# Patient Record
Sex: Female | Born: 1950 | Race: White | Hispanic: No | Marital: Married | Smoking: Former smoker
Health system: Southern US, Community
[De-identification: ages and names within clinical notes are randomized; demographics above are authoritative.]

## PROBLEM LIST (undated history)

## (undated) DIAGNOSIS — J449 Chronic obstructive pulmonary disease, unspecified: Secondary | ICD-10-CM

## (undated) DIAGNOSIS — J309 Allergic rhinitis, unspecified: Secondary | ICD-10-CM

## (undated) DIAGNOSIS — J45909 Unspecified asthma, uncomplicated: Secondary | ICD-10-CM

## (undated) HISTORY — DX: Allergic rhinitis, unspecified: J30.9

## (undated) HISTORY — PX: ABDOMINAL HYSTERECTOMY: SHX81

## (undated) HISTORY — DX: Chronic obstructive pulmonary disease, unspecified: J44.9

## (undated) HISTORY — PX: APPENDECTOMY: SHX54

## (undated) HISTORY — DX: Unspecified asthma, uncomplicated: J45.909

---

## 2011-02-13 DIAGNOSIS — R0602 Shortness of breath: Secondary | ICD-10-CM | POA: Insufficient documentation

## 2011-07-13 DIAGNOSIS — G8929 Other chronic pain: Secondary | ICD-10-CM | POA: Insufficient documentation

## 2012-03-21 DIAGNOSIS — B351 Tinea unguium: Secondary | ICD-10-CM | POA: Insufficient documentation

## 2012-06-18 DIAGNOSIS — M79671 Pain in right foot: Secondary | ICD-10-CM | POA: Insufficient documentation

## 2012-06-18 DIAGNOSIS — G14 Postpolio syndrome: Secondary | ICD-10-CM | POA: Insufficient documentation

## 2012-06-18 DIAGNOSIS — M545 Low back pain, unspecified: Secondary | ICD-10-CM | POA: Insufficient documentation

## 2012-11-15 DIAGNOSIS — F329 Major depressive disorder, single episode, unspecified: Secondary | ICD-10-CM | POA: Insufficient documentation

## 2012-11-15 DIAGNOSIS — F4541 Pain disorder exclusively related to psychological factors: Secondary | ICD-10-CM | POA: Insufficient documentation

## 2012-11-15 DIAGNOSIS — F32A Depression, unspecified: Secondary | ICD-10-CM | POA: Insufficient documentation

## 2012-11-15 DIAGNOSIS — F4542 Pain disorder with related psychological factors: Secondary | ICD-10-CM | POA: Insufficient documentation

## 2013-02-12 DIAGNOSIS — E7800 Pure hypercholesterolemia, unspecified: Secondary | ICD-10-CM | POA: Insufficient documentation

## 2013-04-22 DIAGNOSIS — K921 Melena: Secondary | ICD-10-CM | POA: Insufficient documentation

## 2013-07-08 DIAGNOSIS — M25559 Pain in unspecified hip: Secondary | ICD-10-CM | POA: Insufficient documentation

## 2015-02-10 DIAGNOSIS — J309 Allergic rhinitis, unspecified: Secondary | ICD-10-CM

## 2015-02-10 DIAGNOSIS — J3089 Other allergic rhinitis: Secondary | ICD-10-CM | POA: Insufficient documentation

## 2015-02-10 DIAGNOSIS — J4489 Other specified chronic obstructive pulmonary disease: Secondary | ICD-10-CM | POA: Insufficient documentation

## 2015-02-10 DIAGNOSIS — J449 Chronic obstructive pulmonary disease, unspecified: Secondary | ICD-10-CM

## 2015-02-18 ENCOUNTER — Other Ambulatory Visit: Payer: Self-pay | Admitting: *Deleted

## 2015-02-18 MED ORDER — OMALIZUMAB 150 MG ~~LOC~~ SOLR
150.0000 mg | SUBCUTANEOUS | Status: DC
  Administered 2015-04-04 – 2017-09-19 (×32): 150 mg via SUBCUTANEOUS

## 2015-03-14 ENCOUNTER — Ambulatory Visit (INDEPENDENT_AMBULATORY_CARE_PROVIDER_SITE_OTHER): Payer: Medicare Other

## 2015-03-14 DIAGNOSIS — J309 Allergic rhinitis, unspecified: Secondary | ICD-10-CM

## 2015-03-23 ENCOUNTER — Ambulatory Visit (INDEPENDENT_AMBULATORY_CARE_PROVIDER_SITE_OTHER): Payer: Medicare Other

## 2015-03-23 DIAGNOSIS — J309 Allergic rhinitis, unspecified: Secondary | ICD-10-CM

## 2015-03-30 ENCOUNTER — Ambulatory Visit (INDEPENDENT_AMBULATORY_CARE_PROVIDER_SITE_OTHER): Payer: Medicare Other | Admitting: *Deleted

## 2015-03-30 DIAGNOSIS — J309 Allergic rhinitis, unspecified: Secondary | ICD-10-CM

## 2015-04-04 ENCOUNTER — Ambulatory Visit (INDEPENDENT_AMBULATORY_CARE_PROVIDER_SITE_OTHER): Payer: Medicare Other

## 2015-04-04 DIAGNOSIS — J4540 Moderate persistent asthma, uncomplicated: Secondary | ICD-10-CM | POA: Diagnosis not present

## 2015-04-05 DIAGNOSIS — J4540 Moderate persistent asthma, uncomplicated: Secondary | ICD-10-CM | POA: Diagnosis not present

## 2015-04-11 ENCOUNTER — Ambulatory Visit (INDEPENDENT_AMBULATORY_CARE_PROVIDER_SITE_OTHER): Payer: Medicare Other

## 2015-04-11 DIAGNOSIS — J309 Allergic rhinitis, unspecified: Secondary | ICD-10-CM

## 2015-04-18 DIAGNOSIS — I639 Cerebral infarction, unspecified: Secondary | ICD-10-CM | POA: Insufficient documentation

## 2015-04-18 DIAGNOSIS — A809 Acute poliomyelitis, unspecified: Secondary | ICD-10-CM | POA: Insufficient documentation

## 2015-04-18 DIAGNOSIS — I10 Essential (primary) hypertension: Secondary | ICD-10-CM | POA: Insufficient documentation

## 2015-04-20 ENCOUNTER — Ambulatory Visit (INDEPENDENT_AMBULATORY_CARE_PROVIDER_SITE_OTHER): Payer: Medicare Other

## 2015-04-20 DIAGNOSIS — J309 Allergic rhinitis, unspecified: Secondary | ICD-10-CM

## 2015-04-27 ENCOUNTER — Ambulatory Visit (INDEPENDENT_AMBULATORY_CARE_PROVIDER_SITE_OTHER): Payer: Medicare Other

## 2015-04-27 DIAGNOSIS — J309 Allergic rhinitis, unspecified: Secondary | ICD-10-CM

## 2015-05-03 ENCOUNTER — Ambulatory Visit (INDEPENDENT_AMBULATORY_CARE_PROVIDER_SITE_OTHER): Payer: Medicare Other | Admitting: *Deleted

## 2015-05-03 DIAGNOSIS — J309 Allergic rhinitis, unspecified: Secondary | ICD-10-CM

## 2015-05-03 DIAGNOSIS — J4550 Severe persistent asthma, uncomplicated: Secondary | ICD-10-CM | POA: Diagnosis not present

## 2015-05-04 DIAGNOSIS — J4550 Severe persistent asthma, uncomplicated: Secondary | ICD-10-CM | POA: Diagnosis not present

## 2015-05-09 ENCOUNTER — Ambulatory Visit (INDEPENDENT_AMBULATORY_CARE_PROVIDER_SITE_OTHER): Payer: Medicare Other

## 2015-05-09 DIAGNOSIS — J309 Allergic rhinitis, unspecified: Secondary | ICD-10-CM

## 2015-05-18 ENCOUNTER — Ambulatory Visit (INDEPENDENT_AMBULATORY_CARE_PROVIDER_SITE_OTHER): Payer: Medicare Other

## 2015-05-18 DIAGNOSIS — J309 Allergic rhinitis, unspecified: Secondary | ICD-10-CM | POA: Diagnosis not present

## 2015-05-25 ENCOUNTER — Ambulatory Visit (INDEPENDENT_AMBULATORY_CARE_PROVIDER_SITE_OTHER): Payer: Medicare Other | Admitting: *Deleted

## 2015-05-25 DIAGNOSIS — J309 Allergic rhinitis, unspecified: Secondary | ICD-10-CM

## 2015-05-31 DIAGNOSIS — J3089 Other allergic rhinitis: Secondary | ICD-10-CM

## 2015-06-01 ENCOUNTER — Ambulatory Visit (INDEPENDENT_AMBULATORY_CARE_PROVIDER_SITE_OTHER): Payer: Medicare Other

## 2015-06-01 DIAGNOSIS — J4540 Moderate persistent asthma, uncomplicated: Secondary | ICD-10-CM

## 2015-06-02 DIAGNOSIS — J4540 Moderate persistent asthma, uncomplicated: Secondary | ICD-10-CM | POA: Diagnosis not present

## 2015-06-08 ENCOUNTER — Ambulatory Visit (INDEPENDENT_AMBULATORY_CARE_PROVIDER_SITE_OTHER): Payer: Medicare Other

## 2015-06-08 DIAGNOSIS — J309 Allergic rhinitis, unspecified: Secondary | ICD-10-CM

## 2015-06-16 ENCOUNTER — Ambulatory Visit (INDEPENDENT_AMBULATORY_CARE_PROVIDER_SITE_OTHER): Payer: Medicare Other | Admitting: *Deleted

## 2015-06-16 DIAGNOSIS — J309 Allergic rhinitis, unspecified: Secondary | ICD-10-CM

## 2015-06-23 ENCOUNTER — Ambulatory Visit (INDEPENDENT_AMBULATORY_CARE_PROVIDER_SITE_OTHER): Payer: Medicare Other | Admitting: *Deleted

## 2015-06-23 DIAGNOSIS — J309 Allergic rhinitis, unspecified: Secondary | ICD-10-CM

## 2015-06-28 ENCOUNTER — Ambulatory Visit (INDEPENDENT_AMBULATORY_CARE_PROVIDER_SITE_OTHER): Payer: Medicare Other

## 2015-06-28 DIAGNOSIS — I6930 Unspecified sequelae of cerebral infarction: Secondary | ICD-10-CM | POA: Insufficient documentation

## 2015-06-28 DIAGNOSIS — Z8612 Personal history of poliomyelitis: Secondary | ICD-10-CM | POA: Insufficient documentation

## 2015-06-28 DIAGNOSIS — Z79899 Other long term (current) drug therapy: Secondary | ICD-10-CM | POA: Insufficient documentation

## 2015-06-28 DIAGNOSIS — R0902 Hypoxemia: Secondary | ICD-10-CM | POA: Insufficient documentation

## 2015-06-28 DIAGNOSIS — K5909 Other constipation: Secondary | ICD-10-CM | POA: Insufficient documentation

## 2015-06-28 DIAGNOSIS — J4540 Moderate persistent asthma, uncomplicated: Secondary | ICD-10-CM | POA: Diagnosis not present

## 2015-06-28 DIAGNOSIS — Z87891 Personal history of nicotine dependence: Secondary | ICD-10-CM | POA: Insufficient documentation

## 2015-06-28 DIAGNOSIS — R569 Unspecified convulsions: Secondary | ICD-10-CM | POA: Insufficient documentation

## 2015-06-28 DIAGNOSIS — F419 Anxiety disorder, unspecified: Secondary | ICD-10-CM | POA: Insufficient documentation

## 2015-06-28 DIAGNOSIS — M8580 Other specified disorders of bone density and structure, unspecified site: Secondary | ICD-10-CM | POA: Insufficient documentation

## 2015-06-28 DIAGNOSIS — K760 Fatty (change of) liver, not elsewhere classified: Secondary | ICD-10-CM | POA: Insufficient documentation

## 2015-06-28 DIAGNOSIS — Z227 Latent tuberculosis: Secondary | ICD-10-CM | POA: Insufficient documentation

## 2015-06-29 DIAGNOSIS — J4540 Moderate persistent asthma, uncomplicated: Secondary | ICD-10-CM | POA: Diagnosis not present

## 2015-07-13 ENCOUNTER — Ambulatory Visit (INDEPENDENT_AMBULATORY_CARE_PROVIDER_SITE_OTHER): Payer: Medicare Other | Admitting: *Deleted

## 2015-07-13 DIAGNOSIS — J309 Allergic rhinitis, unspecified: Secondary | ICD-10-CM | POA: Diagnosis not present

## 2015-07-20 ENCOUNTER — Ambulatory Visit (INDEPENDENT_AMBULATORY_CARE_PROVIDER_SITE_OTHER): Payer: Medicare Other | Admitting: *Deleted

## 2015-07-20 DIAGNOSIS — J309 Allergic rhinitis, unspecified: Secondary | ICD-10-CM

## 2015-07-21 DIAGNOSIS — G43009 Migraine without aura, not intractable, without status migrainosus: Secondary | ICD-10-CM | POA: Insufficient documentation

## 2015-07-21 DIAGNOSIS — M5126 Other intervertebral disc displacement, lumbar region: Secondary | ICD-10-CM | POA: Insufficient documentation

## 2015-07-26 ENCOUNTER — Ambulatory Visit (INDEPENDENT_AMBULATORY_CARE_PROVIDER_SITE_OTHER): Payer: Medicare Other

## 2015-07-26 DIAGNOSIS — J4540 Moderate persistent asthma, uncomplicated: Secondary | ICD-10-CM | POA: Diagnosis not present

## 2015-07-27 DIAGNOSIS — J4540 Moderate persistent asthma, uncomplicated: Secondary | ICD-10-CM | POA: Diagnosis not present

## 2015-08-11 ENCOUNTER — Ambulatory Visit (INDEPENDENT_AMBULATORY_CARE_PROVIDER_SITE_OTHER): Payer: Medicare Other | Admitting: *Deleted

## 2015-08-11 DIAGNOSIS — J309 Allergic rhinitis, unspecified: Secondary | ICD-10-CM

## 2015-08-18 ENCOUNTER — Ambulatory Visit (INDEPENDENT_AMBULATORY_CARE_PROVIDER_SITE_OTHER): Payer: Medicare Other | Admitting: *Deleted

## 2015-08-18 DIAGNOSIS — J309 Allergic rhinitis, unspecified: Secondary | ICD-10-CM | POA: Diagnosis not present

## 2015-08-22 ENCOUNTER — Ambulatory Visit (INDEPENDENT_AMBULATORY_CARE_PROVIDER_SITE_OTHER): Payer: Medicare Other | Admitting: *Deleted

## 2015-08-22 DIAGNOSIS — J4540 Moderate persistent asthma, uncomplicated: Secondary | ICD-10-CM | POA: Diagnosis not present

## 2015-08-23 DIAGNOSIS — J4540 Moderate persistent asthma, uncomplicated: Secondary | ICD-10-CM | POA: Diagnosis not present

## 2015-09-06 DIAGNOSIS — R634 Abnormal weight loss: Secondary | ICD-10-CM | POA: Insufficient documentation

## 2015-09-19 ENCOUNTER — Ambulatory Visit (INDEPENDENT_AMBULATORY_CARE_PROVIDER_SITE_OTHER): Payer: Medicare Other

## 2015-09-19 DIAGNOSIS — J4540 Moderate persistent asthma, uncomplicated: Secondary | ICD-10-CM

## 2015-09-20 DIAGNOSIS — J439 Emphysema, unspecified: Secondary | ICD-10-CM | POA: Insufficient documentation

## 2015-09-20 DIAGNOSIS — I2510 Atherosclerotic heart disease of native coronary artery without angina pectoris: Secondary | ICD-10-CM | POA: Insufficient documentation

## 2015-09-20 DIAGNOSIS — J4540 Moderate persistent asthma, uncomplicated: Secondary | ICD-10-CM | POA: Diagnosis not present

## 2015-09-27 ENCOUNTER — Ambulatory Visit (INDEPENDENT_AMBULATORY_CARE_PROVIDER_SITE_OTHER): Payer: Medicare Other | Admitting: *Deleted

## 2015-09-27 DIAGNOSIS — J309 Allergic rhinitis, unspecified: Secondary | ICD-10-CM | POA: Diagnosis not present

## 2015-10-06 ENCOUNTER — Ambulatory Visit (INDEPENDENT_AMBULATORY_CARE_PROVIDER_SITE_OTHER): Payer: Medicare Other | Admitting: *Deleted

## 2015-10-06 DIAGNOSIS — J309 Allergic rhinitis, unspecified: Secondary | ICD-10-CM

## 2015-10-13 ENCOUNTER — Ambulatory Visit (INDEPENDENT_AMBULATORY_CARE_PROVIDER_SITE_OTHER): Payer: Medicare Other

## 2015-10-13 DIAGNOSIS — J309 Allergic rhinitis, unspecified: Secondary | ICD-10-CM

## 2015-10-17 ENCOUNTER — Ambulatory Visit (INDEPENDENT_AMBULATORY_CARE_PROVIDER_SITE_OTHER): Payer: Medicare Other

## 2015-10-17 DIAGNOSIS — J4540 Moderate persistent asthma, uncomplicated: Secondary | ICD-10-CM | POA: Diagnosis not present

## 2015-10-18 DIAGNOSIS — J4540 Moderate persistent asthma, uncomplicated: Secondary | ICD-10-CM | POA: Diagnosis not present

## 2015-11-03 DIAGNOSIS — J3089 Other allergic rhinitis: Secondary | ICD-10-CM

## 2015-11-09 ENCOUNTER — Ambulatory Visit (INDEPENDENT_AMBULATORY_CARE_PROVIDER_SITE_OTHER): Payer: Medicare Other

## 2015-11-09 DIAGNOSIS — J309 Allergic rhinitis, unspecified: Secondary | ICD-10-CM

## 2015-11-15 ENCOUNTER — Ambulatory Visit (INDEPENDENT_AMBULATORY_CARE_PROVIDER_SITE_OTHER): Payer: Medicare Other

## 2015-11-15 DIAGNOSIS — J4540 Moderate persistent asthma, uncomplicated: Secondary | ICD-10-CM

## 2015-11-16 DIAGNOSIS — J4540 Moderate persistent asthma, uncomplicated: Secondary | ICD-10-CM | POA: Diagnosis not present

## 2015-12-01 ENCOUNTER — Ambulatory Visit (INDEPENDENT_AMBULATORY_CARE_PROVIDER_SITE_OTHER): Payer: Medicare Other | Admitting: *Deleted

## 2015-12-01 DIAGNOSIS — J309 Allergic rhinitis, unspecified: Secondary | ICD-10-CM

## 2015-12-02 DIAGNOSIS — K219 Gastro-esophageal reflux disease without esophagitis: Secondary | ICD-10-CM | POA: Insufficient documentation

## 2015-12-08 ENCOUNTER — Ambulatory Visit (INDEPENDENT_AMBULATORY_CARE_PROVIDER_SITE_OTHER): Payer: Medicare Other | Admitting: *Deleted

## 2015-12-08 DIAGNOSIS — J309 Allergic rhinitis, unspecified: Secondary | ICD-10-CM | POA: Diagnosis not present

## 2015-12-15 ENCOUNTER — Ambulatory Visit (INDEPENDENT_AMBULATORY_CARE_PROVIDER_SITE_OTHER): Payer: Medicare Other | Admitting: *Deleted

## 2015-12-15 DIAGNOSIS — J4540 Moderate persistent asthma, uncomplicated: Secondary | ICD-10-CM

## 2015-12-16 DIAGNOSIS — J4540 Moderate persistent asthma, uncomplicated: Secondary | ICD-10-CM | POA: Diagnosis not present

## 2015-12-26 DIAGNOSIS — R238 Other skin changes: Secondary | ICD-10-CM | POA: Insufficient documentation

## 2015-12-26 DIAGNOSIS — R233 Spontaneous ecchymoses: Secondary | ICD-10-CM | POA: Insufficient documentation

## 2015-12-29 ENCOUNTER — Ambulatory Visit (INDEPENDENT_AMBULATORY_CARE_PROVIDER_SITE_OTHER): Payer: Medicare Other | Admitting: *Deleted

## 2015-12-29 DIAGNOSIS — J309 Allergic rhinitis, unspecified: Secondary | ICD-10-CM

## 2016-01-11 DIAGNOSIS — J4540 Moderate persistent asthma, uncomplicated: Secondary | ICD-10-CM

## 2016-01-12 ENCOUNTER — Ambulatory Visit (INDEPENDENT_AMBULATORY_CARE_PROVIDER_SITE_OTHER): Payer: Medicare Other

## 2016-01-12 DIAGNOSIS — J309 Allergic rhinitis, unspecified: Secondary | ICD-10-CM

## 2016-01-12 DIAGNOSIS — J4540 Moderate persistent asthma, uncomplicated: Secondary | ICD-10-CM | POA: Diagnosis not present

## 2016-02-20 ENCOUNTER — Ambulatory Visit: Payer: Medicare Other

## 2016-02-20 ENCOUNTER — Ambulatory Visit (INDEPENDENT_AMBULATORY_CARE_PROVIDER_SITE_OTHER): Payer: Medicare Other

## 2016-02-20 DIAGNOSIS — J4540 Moderate persistent asthma, uncomplicated: Secondary | ICD-10-CM

## 2016-02-21 DIAGNOSIS — J4540 Moderate persistent asthma, uncomplicated: Secondary | ICD-10-CM | POA: Diagnosis not present

## 2016-02-28 DIAGNOSIS — Z9981 Dependence on supplemental oxygen: Secondary | ICD-10-CM | POA: Insufficient documentation

## 2016-03-07 ENCOUNTER — Ambulatory Visit: Payer: Self-pay

## 2016-03-07 DIAGNOSIS — J309 Allergic rhinitis, unspecified: Secondary | ICD-10-CM

## 2016-03-07 DIAGNOSIS — J3089 Other allergic rhinitis: Secondary | ICD-10-CM

## 2016-03-12 DIAGNOSIS — G609 Hereditary and idiopathic neuropathy, unspecified: Secondary | ICD-10-CM | POA: Insufficient documentation

## 2016-03-12 DIAGNOSIS — R630 Anorexia: Secondary | ICD-10-CM | POA: Insufficient documentation

## 2016-03-12 DIAGNOSIS — F5104 Psychophysiologic insomnia: Secondary | ICD-10-CM | POA: Insufficient documentation

## 2016-03-14 ENCOUNTER — Ambulatory Visit (INDEPENDENT_AMBULATORY_CARE_PROVIDER_SITE_OTHER): Payer: Medicare Other

## 2016-03-14 DIAGNOSIS — J309 Allergic rhinitis, unspecified: Secondary | ICD-10-CM | POA: Diagnosis not present

## 2016-03-16 DIAGNOSIS — J4540 Moderate persistent asthma, uncomplicated: Secondary | ICD-10-CM | POA: Diagnosis not present

## 2016-03-19 ENCOUNTER — Ambulatory Visit (INDEPENDENT_AMBULATORY_CARE_PROVIDER_SITE_OTHER): Payer: Medicare Other

## 2016-03-19 DIAGNOSIS — J4540 Moderate persistent asthma, uncomplicated: Secondary | ICD-10-CM

## 2016-03-21 ENCOUNTER — Ambulatory Visit (INDEPENDENT_AMBULATORY_CARE_PROVIDER_SITE_OTHER): Payer: Medicare Other

## 2016-03-21 DIAGNOSIS — J309 Allergic rhinitis, unspecified: Secondary | ICD-10-CM

## 2016-03-28 ENCOUNTER — Ambulatory Visit (INDEPENDENT_AMBULATORY_CARE_PROVIDER_SITE_OTHER): Payer: Medicare Other | Admitting: *Deleted

## 2016-03-28 DIAGNOSIS — J309 Allergic rhinitis, unspecified: Secondary | ICD-10-CM

## 2016-04-04 ENCOUNTER — Ambulatory Visit (INDEPENDENT_AMBULATORY_CARE_PROVIDER_SITE_OTHER): Payer: Medicare Other

## 2016-04-04 DIAGNOSIS — Z789 Other specified health status: Secondary | ICD-10-CM | POA: Insufficient documentation

## 2016-04-04 DIAGNOSIS — J309 Allergic rhinitis, unspecified: Secondary | ICD-10-CM | POA: Diagnosis not present

## 2016-04-11 ENCOUNTER — Ambulatory Visit (INDEPENDENT_AMBULATORY_CARE_PROVIDER_SITE_OTHER): Payer: Medicare Other | Admitting: *Deleted

## 2016-04-11 DIAGNOSIS — J309 Allergic rhinitis, unspecified: Secondary | ICD-10-CM

## 2016-04-16 ENCOUNTER — Ambulatory Visit (INDEPENDENT_AMBULATORY_CARE_PROVIDER_SITE_OTHER): Payer: Medicare Other

## 2016-04-16 DIAGNOSIS — J4540 Moderate persistent asthma, uncomplicated: Secondary | ICD-10-CM | POA: Diagnosis not present

## 2016-05-07 ENCOUNTER — Ambulatory Visit (INDEPENDENT_AMBULATORY_CARE_PROVIDER_SITE_OTHER): Payer: Medicare Other

## 2016-05-07 DIAGNOSIS — J309 Allergic rhinitis, unspecified: Secondary | ICD-10-CM

## 2016-05-14 ENCOUNTER — Ambulatory Visit (INDEPENDENT_AMBULATORY_CARE_PROVIDER_SITE_OTHER): Payer: Medicare Other

## 2016-05-14 DIAGNOSIS — J4540 Moderate persistent asthma, uncomplicated: Secondary | ICD-10-CM | POA: Diagnosis not present

## 2016-05-15 DIAGNOSIS — J4540 Moderate persistent asthma, uncomplicated: Secondary | ICD-10-CM | POA: Diagnosis not present

## 2016-05-21 ENCOUNTER — Ambulatory Visit (INDEPENDENT_AMBULATORY_CARE_PROVIDER_SITE_OTHER): Payer: Medicare Other

## 2016-05-21 DIAGNOSIS — J309 Allergic rhinitis, unspecified: Secondary | ICD-10-CM | POA: Diagnosis not present

## 2016-05-31 ENCOUNTER — Ambulatory Visit (INDEPENDENT_AMBULATORY_CARE_PROVIDER_SITE_OTHER): Payer: Medicare Other

## 2016-05-31 DIAGNOSIS — J309 Allergic rhinitis, unspecified: Secondary | ICD-10-CM

## 2016-06-12 ENCOUNTER — Ambulatory Visit (INDEPENDENT_AMBULATORY_CARE_PROVIDER_SITE_OTHER): Payer: Medicare Other

## 2016-06-12 DIAGNOSIS — J4540 Moderate persistent asthma, uncomplicated: Secondary | ICD-10-CM

## 2016-06-13 DIAGNOSIS — J4540 Moderate persistent asthma, uncomplicated: Secondary | ICD-10-CM | POA: Diagnosis not present

## 2016-06-14 ENCOUNTER — Ambulatory Visit (INDEPENDENT_AMBULATORY_CARE_PROVIDER_SITE_OTHER): Payer: Medicare Other

## 2016-06-14 DIAGNOSIS — J309 Allergic rhinitis, unspecified: Secondary | ICD-10-CM | POA: Diagnosis not present

## 2016-07-03 ENCOUNTER — Ambulatory Visit: Payer: Self-pay

## 2016-07-03 DIAGNOSIS — J309 Allergic rhinitis, unspecified: Secondary | ICD-10-CM

## 2016-07-10 ENCOUNTER — Ambulatory Visit: Payer: Medicare Other

## 2016-07-11 ENCOUNTER — Ambulatory Visit: Payer: Medicare Other

## 2016-07-11 DIAGNOSIS — J4540 Moderate persistent asthma, uncomplicated: Secondary | ICD-10-CM

## 2016-07-11 NOTE — Addendum Note (Signed)
Addended by: Maryjean MornFREEMAN, Pina Sirianni D on: 07/11/2016 10:25 AM   Modules accepted: Orders

## 2016-07-12 ENCOUNTER — Ambulatory Visit: Payer: Medicare Other

## 2016-07-12 ENCOUNTER — Ambulatory Visit (INDEPENDENT_AMBULATORY_CARE_PROVIDER_SITE_OTHER): Payer: Medicare Other

## 2016-07-12 DIAGNOSIS — J4540 Moderate persistent asthma, uncomplicated: Secondary | ICD-10-CM

## 2016-07-19 ENCOUNTER — Encounter: Payer: Self-pay | Admitting: Allergy and Immunology

## 2016-07-19 ENCOUNTER — Ambulatory Visit (INDEPENDENT_AMBULATORY_CARE_PROVIDER_SITE_OTHER): Payer: Medicare Other | Admitting: Allergy and Immunology

## 2016-07-19 ENCOUNTER — Ambulatory Visit: Payer: Self-pay | Admitting: *Deleted

## 2016-07-19 VITALS — BP 124/80 | HR 100 | Temp 97.8°F | Resp 20 | Ht 68.0 in | Wt 129.4 lb

## 2016-07-19 DIAGNOSIS — J449 Chronic obstructive pulmonary disease, unspecified: Secondary | ICD-10-CM | POA: Diagnosis not present

## 2016-07-19 DIAGNOSIS — J309 Allergic rhinitis, unspecified: Secondary | ICD-10-CM

## 2016-07-19 DIAGNOSIS — R040 Epistaxis: Secondary | ICD-10-CM

## 2016-07-19 MED ORDER — GLYCOPYRROLATE 15.6 MCG IN CAPS: 1.0000 | ORAL_CAPSULE | Freq: Two times a day (BID) | RESPIRATORY_TRACT | 5 refills | Status: DC

## 2016-07-19 MED ORDER — ALBUTEROL SULFATE HFA 108 (90 BASE) MCG/ACT IN AERS: 2.0000 | INHALATION_SPRAY | RESPIRATORY_TRACT | 1 refills | Status: DC | PRN

## 2016-07-19 NOTE — Patient Instructions (Addendum)
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  Spirometry reveals severe restrictive pattern without significant reversibility.  Samples and a prescription have been provided for PPL CorporationSeebri Neohaler (glycopyrrolate) inhaled twice a day. She will start with once daily dosing, and if no perceived side effects after the first few days, may increase to twice a day dosing.  For now, continue omalizumab (Xolair) injections, Symbicort 160-4.5 g, 2 inhalations via spacer device twice a day, montelukast 10 mg daily bedtime and albuterol HFA, 1-2 inhalations every 4-6 hours as needed.  Subjective and objective measures of pulmonary function will be followed and the treatment plan will be adjusted accordingly.  Allergic rhinitis As she has been on aeroallergen immunotherapy for over 3-1/2 years,  I do not believe that the expected benefits of continuing this treatment outweigh the potential risks given her pulmonary function.  Discontinue aeroallergen immunotherapy.  Epistaxis  I have recommended nasal saline gel to moisturize the nasal mucosa. Samples have been provided.  If this problem persists or progresses, otolaryngology evaluation may be warranted.   Return in about 4 weeks (around 08/16/2016), or if symptoms worsen or fail to improve.

## 2016-07-19 NOTE — Assessment & Plan Note (Signed)
   I have recommended nasal saline gel to moisturize the nasal mucosa. Samples have been provided.  If this problem persists or progresses, otolaryngology evaluation may be warranted.

## 2016-07-19 NOTE — Progress Notes (Signed)
Follow-up Note  RE: Anne DeforestDebbie Rich MRN: [id number] DOB: Aug 01, 1950 Date of Office Visit: 07/19/2016  Primary care provider: Ananias PilgrimASRES,ALEHEGN, MD Referring provider: Ananias PilgrimAsres, Alehegn, MD  History of present illness: Anne Rich is a 66 y.o. female with allergic rhinitis on immunotherapy and asthma on Xolair presenting today for follow up. She was last seen in this clinic in July 2016 by Dr. Clydie BraunBhatti, who has since left the practice.  She had been living in house with hardwood floors throughout, however has more recently moved into an apartment with carpeting.  She states that the scent of the carpeting as well as chemicals that they use on the carpeting and around the apartment has increased the frequency of her asthma symptoms.  She is currently taking Xolair injections, Symbicort 160-4.5 g, 2 inhalations via spacer device twice a day, and montelukast 10 mg daily bedtime.  She had to discontinue Spiriva Handihaler and respimat due to perceived side effects.  She was diagnosed with pneumonia by x-ray approximately 1 month ago and was treated successfully with antibiotics, however she has still had some residual dyspnea and coughing.  She is followed by her pulmonologist, Dr. Donor. She has no nasal symptom complaints today other than a sore in her left nostril which has been causing bleeding, typically in the morning time.  She has been on aeroallergen immunotherapy since July 2015.   Assessment and plan: Asthma with COPD Currently with suboptimal control.  Spirometry reveals severe restrictive pattern without significant reversibility.  Samples and a prescription have been provided for [employer]Seebri Neohaler (glycopyrrolate) inhaled twice a day. She will start with once daily dosing, and if no perceived side effects after the first few days, may increase to twice a day dosing.  For now, continue omalizumab (Xolair) injections, Symbicort 160-4.5 g, 2 inhalations via spacer device twice a day, montelukast 10 mg  daily bedtime and albuterol HFA, 1-2 inhalations every 4-6 hours as needed.  Subjective and objective measures of pulmonary function will be followed and the treatment plan will be adjusted accordingly.  Allergic rhinitis As she has been on aeroallergen immunotherapy for over 3-1/2 years,  I do not believe that the expected benefits of continuing this treatment outweigh the potential risks given her pulmonary function.  Discontinue aeroallergen immunotherapy.  Epistaxis  I have recommended nasal saline gel to moisturize the nasal mucosa. Samples have been provided.  If this problem persists or progresses, otolaryngology evaluation may be warranted.   Meds ordered this encounter  Medications  . albuterol (VENTOLIN HFA) 108 (90 Base) MCG/ACT inhaler    Sig: Inhale 2 puffs into the lungs every 4 (four) hours as needed for wheezing or shortness of breath.    Dispense:  1 Inhaler    Refill:  1  . Glycopyrrolate (SEEBRI NEOHALER) 15.6 MCG CAPS    Sig: Place 1 capsule into inhaler and inhale 2 (two) times daily at 10 AM and 5 PM. Rinse, gargle and spit out after use    Dispense:  60 capsule    Refill:  5    Diagnostics: Spirometry reveals an FVC of 1.67 L (46% predicted) and an FEV1 of 0.64 L (23% predicted) with 10 mL (2%) postbronchodilator improvement.  Severe restrictive pattern without significant reversibility.  Please see scanned spirometry results for details.    Physical examination: Blood pressure 124/80, pulse 100, temperature 97.8 F (36.6 C), temperature source Oral, resp. rate 20, height 5\' 8"  (1.727 m), weight 129 lb 6.6 oz (58.7 kg).  General: Alert, interactive,  in no acute distress. HEENT: TMs pearly gray, turbinates mildly edematous without discharge, post-pharynx mildly erythematous. Neck: Supple without lymphadenopathy. Lungs: Decreased breath sounds bilaterally without wheezing, rhonchi or rales. CV: Normal S1, S2 without murmurs. Skin: Warm and dry, without  lesions or rashes.  The following portions of the patient's history were reviewed and updated as appropriate: allergies, current medications, past family history, past medical history, past social history, past surgical history and problem list.  Allergies as of 07/19/2016      Reactions   Contrast Media [iodinated Diagnostic Agents]    Penicillins    Shellfish-derived Products       Medication List       Accurate as of 07/19/16  1:09 PM. Always use your most recent med list.          albuterol 108 (90 Base) MCG/ACT inhaler Commonly known as:  VENTOLIN HFA Inhale 2 puffs into the lungs every 4 (four) hours as needed for wheezing or shortness of breath.   aspirin 81 MG tablet Take 81 mg by mouth daily.   ATIVAN PO Take by mouth 3 (three) times daily.   B COMPLEX PO Take by mouth.   budesonide-formoterol 160-4.5 MCG/ACT inhaler Commonly known as:  SYMBICORT Inhale 2 puffs into the lungs 2 (two) times daily.   carbamazepine 200 MG tablet Commonly known as:  TEGRETOL Take 200 mg by mouth 2 (two) times daily.   CENTRUM PO Take by mouth.   cetirizine 10 MG tablet Commonly known as:  ZYRTEC Take 10 mg by mouth daily.   cyclobenzaprine 5 MG tablet Commonly known as:  FLEXERIL Take 5 mg by mouth daily.   EPIPEN 2-PAK 0.3 mg/0.3 mL Soaj injection Generic drug:  EPINEPHrine Inject 0.3 mg into the muscle Once PRN.   esomeprazole 40 MG capsule Commonly known as:  NEXIUM Take 40 mg by mouth daily at 12 noon.   estradiol 2 MG tablet Commonly known as:  ESTRACE Take 2 mg by mouth.   gabapentin 300 MG capsule Commonly known as:  NEURONTIN Take by mouth.   Glycopyrrolate 15.6 MCG Caps Commonly known as:  SEEBRI NEOHALER Place 1 capsule into inhaler and inhale 2 (two) times daily at 10 AM and 5 PM. Rinse, gargle and spit out after use   levalbuterol 1.25 MG/3ML nebulizer solution Commonly known as:  XOPENEX Take 1.25 mg by nebulization every 4 (four) hours as needed  for wheezing.   levETIRAcetam 1000 MG tablet Commonly known as:  KEPPRA Take 1,000 mg by mouth 2 (two) times daily.   montelukast 10 MG tablet Commonly known as:  SINGULAIR Take 10 mg by mouth at bedtime.   omalizumab 150 MG injection Commonly known as:  XOLAIR Inject 150 mg into the skin.   ondansetron 4 MG tablet Commonly known as:  ZOFRAN Take 4 mg by mouth.   OXYCODONE ER PO Take by mouth as needed.   SPIRIVA RESPIMAT IN Inhale 1 puff into the lungs daily.   VITAMIN C PO Take by mouth.       Allergies  Allergen Reactions  . Contrast Media [Iodinated Diagnostic Agents]   . Penicillins   . Shellfish-Derived Products    Review of systems: Review of systems negative except as noted in HPI / PMHx or noted below: Constitutional: Negative.  HENT: Negative.   Eyes: Negative.  Respiratory: Negative.   Cardiovascular: Negative.  Gastrointestinal: Negative.  Genitourinary: Negative.  Musculoskeletal: Negative.  Neurological: Negative.  Endo/Heme/Allergies: Negative.  Cutaneous: Negative.  History  reviewed. No pertinent past medical history.  History reviewed. No pertinent family history.  Social History   Social History  . Marital status: Married    Spouse name: N/A  . Number of children: N/A  . Years of education: N/A   Occupational History  . Not on file.   Social History Main Topics  . Smoking status: Former Games developer  . Smokeless tobacco: Never Used  . Alcohol use Not on file  . Drug use: Unknown  . Sexual activity: Not on file   Other Topics Concern  . Not on file   Social History Narrative  . No narrative on file    I appreciate the opportunity to take part in Geet's care. Please do not hesitate to contact me with questions.  Sincerely,   R. Jorene Guest, MD

## 2016-07-19 NOTE — Assessment & Plan Note (Addendum)
Currently with suboptimal control.  Spirometry reveals severe restrictive pattern without significant reversibility.  Samples and a prescription have been provided for [employer] CorporationSeebri Neohaler (glycopyrrolate) inhaled twice a day. She will start with once daily dosing, and if no perceived side effects after the first few days, may increase to twice a day dosing.  For now, continue omalizumab (Xolair) injections, Symbicort 160-4.5 g, 2 inhalations via spacer device twice a day, montelukast 10 mg daily bedtime and albuterol HFA, 1-2 inhalations every 4-6 hours as needed.  Subjective and objective measures of pulmonary function will be followed and the treatment plan will be adjusted accordingly.

## 2016-07-19 NOTE — Assessment & Plan Note (Addendum)
As she has been on aeroallergen immunotherapy for over 3-1/2 years,  I do not believe that the expected benefits of continuing this treatment outweigh the potential risks given her pulmonary function.  Discontinue aeroallergen immunotherapy.

## 2016-07-20 ENCOUNTER — Telehealth: Payer: Self-pay | Admitting: *Deleted

## 2016-07-20 NOTE — Telephone Encounter (Signed)
Pt calling said her new med seebri neohaler had to be ordered, the pharmacy did not have it and would not be able to tell her how much it would cost until they got it in stock. She said she had an asthma attack last night. I told her to use her ventolin inhaler 1-2 puffs every 4-6 hours as needed along with her Symbicort inhaler 2 puffs twice a day. She wants to know what else she can do if this med is not affordable? Please advise.

## 2016-07-23 NOTE — Telephone Encounter (Signed)
Lets first see if it is affordable and if not we will make that decision when we get there.

## 2016-07-27 ENCOUNTER — Telehealth: Payer: Self-pay | Admitting: *Deleted

## 2016-07-27 DIAGNOSIS — R040 Epistaxis: Secondary | ICD-10-CM

## 2016-07-27 NOTE — Telephone Encounter (Signed)
Patient called and stated that the sore in her nose is not any better. What would you like to do next? Please advise.

## 2016-07-27 NOTE — Telephone Encounter (Signed)
Reviewed chart. I will place a consult for ENT. Please call the patient on Monday to let her know. Thanks.  Malachi Bonds, MD FAAAAI [facility]

## 2016-07-27 NOTE — Addendum Note (Signed)
Addended by: Alfonse SpruceGALLAGHER, Shayann Garbutt LOUIS on: 07/27/2016 09:02 PM   Modules accepted: Orders

## 2016-07-30 NOTE — Telephone Encounter (Signed)
Noted  

## 2016-08-07 DIAGNOSIS — J4540 Moderate persistent asthma, uncomplicated: Secondary | ICD-10-CM | POA: Diagnosis not present

## 2016-08-08 ENCOUNTER — Encounter: Payer: Self-pay | Admitting: *Deleted

## 2016-08-08 ENCOUNTER — Ambulatory Visit (INDEPENDENT_AMBULATORY_CARE_PROVIDER_SITE_OTHER): Payer: Medicare Other | Admitting: *Deleted

## 2016-08-08 DIAGNOSIS — J4540 Moderate persistent asthma, uncomplicated: Secondary | ICD-10-CM

## 2016-08-16 ENCOUNTER — Ambulatory Visit: Payer: Medicare Other | Admitting: Allergy and Immunology

## 2016-08-20 ENCOUNTER — Telehealth: Payer: Self-pay | Admitting: Allergy

## 2016-08-20 NOTE — Telephone Encounter (Signed)
Patient called and said she wanted to start her allergy injections back. Dr Christell ConstantMoore suggested  her starting them back  Does she need to come in for visit. Or can she just start them back. Patient is having eye surgery on April 2nd. Please advise.

## 2016-08-20 NOTE — Telephone Encounter (Signed)
Informed patient per Dr. Nunzio CobbsBobbitt that she could start back on allergy injections. Patient will start back on  Injections on March  14th. Will start green vile

## 2016-08-20 NOTE — Telephone Encounter (Signed)
Restart allergy injections.

## 2016-08-23 ENCOUNTER — Ambulatory Visit (INDEPENDENT_AMBULATORY_CARE_PROVIDER_SITE_OTHER): Payer: Medicare Other

## 2016-08-23 DIAGNOSIS — J309 Allergic rhinitis, unspecified: Secondary | ICD-10-CM

## 2016-08-30 ENCOUNTER — Ambulatory Visit (INDEPENDENT_AMBULATORY_CARE_PROVIDER_SITE_OTHER): Payer: Medicare Other

## 2016-08-30 DIAGNOSIS — J309 Allergic rhinitis, unspecified: Secondary | ICD-10-CM

## 2016-09-05 DIAGNOSIS — J4540 Moderate persistent asthma, uncomplicated: Secondary | ICD-10-CM | POA: Diagnosis not present

## 2016-09-06 ENCOUNTER — Ambulatory Visit (INDEPENDENT_AMBULATORY_CARE_PROVIDER_SITE_OTHER): Payer: Medicare Other | Admitting: *Deleted

## 2016-09-06 DIAGNOSIS — J4540 Moderate persistent asthma, uncomplicated: Secondary | ICD-10-CM

## 2016-09-07 ENCOUNTER — Ambulatory Visit: Payer: Self-pay

## 2016-09-11 ENCOUNTER — Ambulatory Visit (INDEPENDENT_AMBULATORY_CARE_PROVIDER_SITE_OTHER): Payer: Medicare Other

## 2016-09-11 DIAGNOSIS — J3089 Other allergic rhinitis: Secondary | ICD-10-CM

## 2016-09-20 ENCOUNTER — Ambulatory Visit (INDEPENDENT_AMBULATORY_CARE_PROVIDER_SITE_OTHER): Payer: Medicare Other | Admitting: *Deleted

## 2016-09-20 DIAGNOSIS — J309 Allergic rhinitis, unspecified: Secondary | ICD-10-CM | POA: Diagnosis not present

## 2016-09-26 ENCOUNTER — Ambulatory Visit (INDEPENDENT_AMBULATORY_CARE_PROVIDER_SITE_OTHER): Payer: Medicare Other | Admitting: *Deleted

## 2016-09-26 DIAGNOSIS — J309 Allergic rhinitis, unspecified: Secondary | ICD-10-CM | POA: Diagnosis not present

## 2016-10-03 DIAGNOSIS — J4540 Moderate persistent asthma, uncomplicated: Secondary | ICD-10-CM

## 2016-10-04 ENCOUNTER — Ambulatory Visit (INDEPENDENT_AMBULATORY_CARE_PROVIDER_SITE_OTHER): Payer: Medicare Other

## 2016-10-04 DIAGNOSIS — J4540 Moderate persistent asthma, uncomplicated: Secondary | ICD-10-CM | POA: Diagnosis not present

## 2016-10-10 ENCOUNTER — Ambulatory Visit (INDEPENDENT_AMBULATORY_CARE_PROVIDER_SITE_OTHER): Payer: Medicare Other

## 2016-10-10 ENCOUNTER — Telehealth: Payer: Self-pay | Admitting: Allergy and Immunology

## 2016-10-10 DIAGNOSIS — J309 Allergic rhinitis, unspecified: Secondary | ICD-10-CM

## 2016-10-10 NOTE — Telephone Encounter (Signed)
Anne Rich will adjust fee - kt

## 2016-10-10 NOTE — Telephone Encounter (Signed)
Can you please cancel the no show fee for Anne Rich? This is a first time missed office visit for her and she wasn't aware of the cancellation policy. Thanks

## 2016-10-11 NOTE — Telephone Encounter (Signed)
I removed the no show fee in [employer]. I advised patient to disregard the bill.

## 2016-10-18 ENCOUNTER — Ambulatory Visit (INDEPENDENT_AMBULATORY_CARE_PROVIDER_SITE_OTHER): Payer: Medicare Other

## 2016-10-18 DIAGNOSIS — J309 Allergic rhinitis, unspecified: Secondary | ICD-10-CM | POA: Diagnosis not present

## 2016-10-24 DIAGNOSIS — H2512 Age-related nuclear cataract, left eye: Secondary | ICD-10-CM | POA: Insufficient documentation

## 2016-10-24 DIAGNOSIS — H25012 Cortical age-related cataract, left eye: Secondary | ICD-10-CM | POA: Insufficient documentation

## 2016-10-25 ENCOUNTER — Ambulatory Visit (INDEPENDENT_AMBULATORY_CARE_PROVIDER_SITE_OTHER): Payer: Medicare Other

## 2016-10-25 DIAGNOSIS — J309 Allergic rhinitis, unspecified: Secondary | ICD-10-CM | POA: Diagnosis not present

## 2016-10-31 DIAGNOSIS — J4540 Moderate persistent asthma, uncomplicated: Secondary | ICD-10-CM | POA: Diagnosis not present

## 2016-11-01 ENCOUNTER — Ambulatory Visit (INDEPENDENT_AMBULATORY_CARE_PROVIDER_SITE_OTHER): Payer: Medicare Other | Admitting: *Deleted

## 2016-11-01 DIAGNOSIS — J4540 Moderate persistent asthma, uncomplicated: Secondary | ICD-10-CM | POA: Diagnosis not present

## 2016-11-08 ENCOUNTER — Ambulatory Visit (INDEPENDENT_AMBULATORY_CARE_PROVIDER_SITE_OTHER): Payer: Medicare Other

## 2016-11-08 DIAGNOSIS — J309 Allergic rhinitis, unspecified: Secondary | ICD-10-CM | POA: Diagnosis not present

## 2016-11-14 ENCOUNTER — Ambulatory Visit (INDEPENDENT_AMBULATORY_CARE_PROVIDER_SITE_OTHER): Payer: Medicare Other

## 2016-11-14 DIAGNOSIS — J309 Allergic rhinitis, unspecified: Secondary | ICD-10-CM | POA: Diagnosis not present

## 2016-11-22 ENCOUNTER — Ambulatory Visit (INDEPENDENT_AMBULATORY_CARE_PROVIDER_SITE_OTHER): Payer: Medicare Other | Admitting: *Deleted

## 2016-11-22 DIAGNOSIS — J309 Allergic rhinitis, unspecified: Secondary | ICD-10-CM

## 2016-11-27 ENCOUNTER — Ambulatory Visit (INDEPENDENT_AMBULATORY_CARE_PROVIDER_SITE_OTHER): Payer: Medicare Other

## 2016-11-27 DIAGNOSIS — J309 Allergic rhinitis, unspecified: Secondary | ICD-10-CM

## 2016-11-28 DIAGNOSIS — J4540 Moderate persistent asthma, uncomplicated: Secondary | ICD-10-CM | POA: Diagnosis not present

## 2016-11-29 ENCOUNTER — Ambulatory Visit (INDEPENDENT_AMBULATORY_CARE_PROVIDER_SITE_OTHER): Payer: Medicare Other

## 2016-11-29 DIAGNOSIS — J4540 Moderate persistent asthma, uncomplicated: Secondary | ICD-10-CM

## 2016-12-13 ENCOUNTER — Ambulatory Visit (INDEPENDENT_AMBULATORY_CARE_PROVIDER_SITE_OTHER): Payer: Medicare Other

## 2016-12-13 DIAGNOSIS — J309 Allergic rhinitis, unspecified: Secondary | ICD-10-CM | POA: Diagnosis not present

## 2016-12-18 ENCOUNTER — Ambulatory Visit (INDEPENDENT_AMBULATORY_CARE_PROVIDER_SITE_OTHER): Payer: Medicare Other

## 2016-12-18 DIAGNOSIS — J309 Allergic rhinitis, unspecified: Secondary | ICD-10-CM

## 2016-12-24 ENCOUNTER — Ambulatory Visit (INDEPENDENT_AMBULATORY_CARE_PROVIDER_SITE_OTHER): Payer: Medicare Other

## 2016-12-24 DIAGNOSIS — J309 Allergic rhinitis, unspecified: Secondary | ICD-10-CM

## 2016-12-26 DIAGNOSIS — J4540 Moderate persistent asthma, uncomplicated: Secondary | ICD-10-CM | POA: Diagnosis not present

## 2016-12-27 ENCOUNTER — Ambulatory Visit (INDEPENDENT_AMBULATORY_CARE_PROVIDER_SITE_OTHER): Payer: Medicare Other

## 2016-12-27 ENCOUNTER — Telehealth: Payer: Self-pay

## 2016-12-27 DIAGNOSIS — J4540 Moderate persistent asthma, uncomplicated: Secondary | ICD-10-CM | POA: Diagnosis not present

## 2016-12-27 NOTE — Telephone Encounter (Signed)
Patient came in today for her Xolair injection. She was upset that she had not received a call back, and I did apologize for that. She did contact Dr. Nelva Bushoner's office to see if she would be able to get any help there. She informed me that he was unavailable for now because he had a surgical procedure done. I had already spoken with Dr. Nunzio CobbsBobbitt regarding the contact with patient. The renovations on her apartment will not be completed for 2 months. Dr. Nunzio CobbsBobbitt advised me to give her 10 tablets of 10 mg prednisone and instruct to take 0.5 tab daily while construction is ongoing, but not to take it if she is feeling okay. Patient acknowledged her understanding of the instructions. She appreciated the prednisone.

## 2016-12-27 NOTE — Telephone Encounter (Signed)
Per Gasper LloydLinda Collins, CMA patient contacted office with concern. Documented on Paper.  "Pt has asthma really bad. They are working outside in hallway of her apartment, pulling up carpet/painting. Pt has to go through the dust with her lung disease. Is there anything you can do to help her out. Manager phone is [phone]. She called again and said that if maybe she could have some prednisone, when they started pulling up the carpet that she would be okay. Sanding going into filter."

## 2016-12-27 NOTE — Telephone Encounter (Signed)
Noted  

## 2017-01-03 ENCOUNTER — Ambulatory Visit (INDEPENDENT_AMBULATORY_CARE_PROVIDER_SITE_OTHER): Payer: Medicare Other

## 2017-01-03 DIAGNOSIS — J309 Allergic rhinitis, unspecified: Secondary | ICD-10-CM | POA: Diagnosis not present

## 2017-01-09 ENCOUNTER — Ambulatory Visit (INDEPENDENT_AMBULATORY_CARE_PROVIDER_SITE_OTHER): Payer: Medicare Other | Admitting: *Deleted

## 2017-01-09 DIAGNOSIS — J309 Allergic rhinitis, unspecified: Secondary | ICD-10-CM | POA: Diagnosis not present

## 2017-01-17 ENCOUNTER — Ambulatory Visit (INDEPENDENT_AMBULATORY_CARE_PROVIDER_SITE_OTHER): Payer: Medicare Other

## 2017-01-17 DIAGNOSIS — J309 Allergic rhinitis, unspecified: Secondary | ICD-10-CM | POA: Diagnosis not present

## 2017-01-22 ENCOUNTER — Ambulatory Visit (INDEPENDENT_AMBULATORY_CARE_PROVIDER_SITE_OTHER): Payer: Medicare Other

## 2017-01-22 DIAGNOSIS — J3089 Other allergic rhinitis: Secondary | ICD-10-CM | POA: Diagnosis not present

## 2017-01-23 DIAGNOSIS — J4540 Moderate persistent asthma, uncomplicated: Secondary | ICD-10-CM

## 2017-01-24 ENCOUNTER — Ambulatory Visit (INDEPENDENT_AMBULATORY_CARE_PROVIDER_SITE_OTHER): Payer: Medicare Other | Admitting: *Deleted

## 2017-01-24 DIAGNOSIS — J4540 Moderate persistent asthma, uncomplicated: Secondary | ICD-10-CM

## 2017-01-31 ENCOUNTER — Ambulatory Visit (INDEPENDENT_AMBULATORY_CARE_PROVIDER_SITE_OTHER): Payer: Medicare Other

## 2017-01-31 DIAGNOSIS — J3089 Other allergic rhinitis: Secondary | ICD-10-CM

## 2017-02-07 ENCOUNTER — Ambulatory Visit (INDEPENDENT_AMBULATORY_CARE_PROVIDER_SITE_OTHER): Payer: Medicare Other

## 2017-02-07 DIAGNOSIS — J309 Allergic rhinitis, unspecified: Secondary | ICD-10-CM

## 2017-02-14 ENCOUNTER — Ambulatory Visit (INDEPENDENT_AMBULATORY_CARE_PROVIDER_SITE_OTHER): Payer: Medicare Other

## 2017-02-14 DIAGNOSIS — J309 Allergic rhinitis, unspecified: Secondary | ICD-10-CM

## 2017-02-18 ENCOUNTER — Ambulatory Visit (INDEPENDENT_AMBULATORY_CARE_PROVIDER_SITE_OTHER): Payer: Medicare Other

## 2017-02-18 DIAGNOSIS — J309 Allergic rhinitis, unspecified: Secondary | ICD-10-CM

## 2017-02-20 ENCOUNTER — Encounter: Payer: Self-pay | Admitting: *Deleted

## 2017-02-20 DIAGNOSIS — J4540 Moderate persistent asthma, uncomplicated: Secondary | ICD-10-CM

## 2017-02-20 NOTE — Progress Notes (Signed)
Maintenance vial made. Exp: 02-20-18. hc 

## 2017-02-21 ENCOUNTER — Ambulatory Visit (INDEPENDENT_AMBULATORY_CARE_PROVIDER_SITE_OTHER): Payer: Medicare Other | Admitting: *Deleted

## 2017-02-21 DIAGNOSIS — J3089 Other allergic rhinitis: Secondary | ICD-10-CM

## 2017-02-21 DIAGNOSIS — J4540 Moderate persistent asthma, uncomplicated: Secondary | ICD-10-CM

## 2017-02-28 ENCOUNTER — Ambulatory Visit (INDEPENDENT_AMBULATORY_CARE_PROVIDER_SITE_OTHER): Payer: Medicare Other

## 2017-02-28 DIAGNOSIS — J309 Allergic rhinitis, unspecified: Secondary | ICD-10-CM

## 2017-03-11 ENCOUNTER — Ambulatory Visit (INDEPENDENT_AMBULATORY_CARE_PROVIDER_SITE_OTHER): Payer: Medicare Other

## 2017-03-11 DIAGNOSIS — J309 Allergic rhinitis, unspecified: Secondary | ICD-10-CM

## 2017-03-19 ENCOUNTER — Ambulatory Visit (INDEPENDENT_AMBULATORY_CARE_PROVIDER_SITE_OTHER): Payer: Medicare Other

## 2017-03-19 DIAGNOSIS — J309 Allergic rhinitis, unspecified: Secondary | ICD-10-CM | POA: Diagnosis not present

## 2017-03-21 ENCOUNTER — Ambulatory Visit: Payer: Self-pay

## 2017-03-22 ENCOUNTER — Ambulatory Visit: Payer: Self-pay

## 2017-03-26 DIAGNOSIS — J4540 Moderate persistent asthma, uncomplicated: Secondary | ICD-10-CM | POA: Diagnosis not present

## 2017-03-27 ENCOUNTER — Ambulatory Visit (INDEPENDENT_AMBULATORY_CARE_PROVIDER_SITE_OTHER): Payer: Medicare Other

## 2017-03-27 DIAGNOSIS — J4540 Moderate persistent asthma, uncomplicated: Secondary | ICD-10-CM

## 2017-03-29 ENCOUNTER — Ambulatory Visit: Payer: Self-pay

## 2017-03-29 DIAGNOSIS — J309 Allergic rhinitis, unspecified: Secondary | ICD-10-CM

## 2017-04-04 ENCOUNTER — Ambulatory Visit (INDEPENDENT_AMBULATORY_CARE_PROVIDER_SITE_OTHER): Payer: Medicare Other

## 2017-04-04 DIAGNOSIS — J309 Allergic rhinitis, unspecified: Secondary | ICD-10-CM

## 2017-04-15 ENCOUNTER — Ambulatory Visit (INDEPENDENT_AMBULATORY_CARE_PROVIDER_SITE_OTHER): Payer: Medicare Other

## 2017-04-15 DIAGNOSIS — J309 Allergic rhinitis, unspecified: Secondary | ICD-10-CM | POA: Diagnosis not present

## 2017-04-22 ENCOUNTER — Ambulatory Visit (INDEPENDENT_AMBULATORY_CARE_PROVIDER_SITE_OTHER): Payer: Medicare Other

## 2017-04-22 DIAGNOSIS — J309 Allergic rhinitis, unspecified: Secondary | ICD-10-CM

## 2017-04-23 DIAGNOSIS — J4540 Moderate persistent asthma, uncomplicated: Secondary | ICD-10-CM | POA: Diagnosis not present

## 2017-04-24 ENCOUNTER — Ambulatory Visit (INDEPENDENT_AMBULATORY_CARE_PROVIDER_SITE_OTHER): Payer: Medicare Other

## 2017-04-24 DIAGNOSIS — J4540 Moderate persistent asthma, uncomplicated: Secondary | ICD-10-CM | POA: Diagnosis not present

## 2017-05-06 ENCOUNTER — Ambulatory Visit (INDEPENDENT_AMBULATORY_CARE_PROVIDER_SITE_OTHER): Payer: Medicare Other

## 2017-05-06 DIAGNOSIS — J309 Allergic rhinitis, unspecified: Secondary | ICD-10-CM | POA: Diagnosis not present

## 2017-05-22 ENCOUNTER — Ambulatory Visit: Payer: Self-pay

## 2017-05-22 DIAGNOSIS — J4540 Moderate persistent asthma, uncomplicated: Secondary | ICD-10-CM

## 2017-05-23 ENCOUNTER — Ambulatory Visit (INDEPENDENT_AMBULATORY_CARE_PROVIDER_SITE_OTHER): Payer: Medicare Other

## 2017-05-23 DIAGNOSIS — J4540 Moderate persistent asthma, uncomplicated: Secondary | ICD-10-CM

## 2017-05-29 ENCOUNTER — Ambulatory Visit (INDEPENDENT_AMBULATORY_CARE_PROVIDER_SITE_OTHER): Payer: Medicare Other

## 2017-05-29 DIAGNOSIS — J309 Allergic rhinitis, unspecified: Secondary | ICD-10-CM

## 2017-06-07 ENCOUNTER — Ambulatory Visit (INDEPENDENT_AMBULATORY_CARE_PROVIDER_SITE_OTHER): Payer: Medicare Other

## 2017-06-07 DIAGNOSIS — J309 Allergic rhinitis, unspecified: Secondary | ICD-10-CM

## 2017-06-13 ENCOUNTER — Ambulatory Visit (INDEPENDENT_AMBULATORY_CARE_PROVIDER_SITE_OTHER): Payer: Medicare Other

## 2017-06-13 DIAGNOSIS — J309 Allergic rhinitis, unspecified: Secondary | ICD-10-CM

## 2017-06-19 DIAGNOSIS — J4540 Moderate persistent asthma, uncomplicated: Secondary | ICD-10-CM | POA: Diagnosis not present

## 2017-06-20 ENCOUNTER — Ambulatory Visit (INDEPENDENT_AMBULATORY_CARE_PROVIDER_SITE_OTHER): Payer: Medicare Other | Admitting: *Deleted

## 2017-06-20 DIAGNOSIS — J4540 Moderate persistent asthma, uncomplicated: Secondary | ICD-10-CM

## 2017-06-25 NOTE — Progress Notes (Signed)
VIALS EXP 06-26-18

## 2017-06-26 DIAGNOSIS — J3089 Other allergic rhinitis: Secondary | ICD-10-CM

## 2017-06-27 ENCOUNTER — Ambulatory Visit (INDEPENDENT_AMBULATORY_CARE_PROVIDER_SITE_OTHER): Payer: Medicare Other | Admitting: *Deleted

## 2017-06-27 DIAGNOSIS — J309 Allergic rhinitis, unspecified: Secondary | ICD-10-CM

## 2017-07-16 ENCOUNTER — Ambulatory Visit (INDEPENDENT_AMBULATORY_CARE_PROVIDER_SITE_OTHER): Payer: Medicare Other

## 2017-07-16 DIAGNOSIS — J309 Allergic rhinitis, unspecified: Secondary | ICD-10-CM

## 2017-07-17 DIAGNOSIS — J4540 Moderate persistent asthma, uncomplicated: Secondary | ICD-10-CM

## 2017-07-18 ENCOUNTER — Ambulatory Visit (INDEPENDENT_AMBULATORY_CARE_PROVIDER_SITE_OTHER): Payer: Medicare Other | Admitting: *Deleted

## 2017-07-18 ENCOUNTER — Encounter: Payer: Self-pay | Admitting: Allergy and Immunology

## 2017-07-18 ENCOUNTER — Ambulatory Visit (INDEPENDENT_AMBULATORY_CARE_PROVIDER_SITE_OTHER): Payer: Medicare Other | Admitting: Allergy and Immunology

## 2017-07-18 VITALS — BP 144/88 | HR 76 | Temp 97.6°F | Resp 16

## 2017-07-18 DIAGNOSIS — J449 Chronic obstructive pulmonary disease, unspecified: Secondary | ICD-10-CM

## 2017-07-18 DIAGNOSIS — J4540 Moderate persistent asthma, uncomplicated: Secondary | ICD-10-CM | POA: Diagnosis not present

## 2017-07-18 DIAGNOSIS — R040 Epistaxis: Secondary | ICD-10-CM | POA: Diagnosis not present

## 2017-07-18 DIAGNOSIS — J3089 Other allergic rhinitis: Secondary | ICD-10-CM

## 2017-07-18 MED ORDER — BUDESONIDE-FORMOTEROL FUMARATE 160-4.5 MCG/ACT IN AERO: 2.0000 | INHALATION_SPRAY | Freq: Two times a day (BID) | RESPIRATORY_TRACT | 3 refills | Status: DC

## 2017-07-18 MED ORDER — MONTELUKAST SODIUM 10 MG PO TABS: 10.0000 mg | ORAL_TABLET | Freq: Every day | ORAL | 3 refills | Status: DC

## 2017-07-18 NOTE — Assessment & Plan Note (Signed)
   Continue appropriate allergen avoidance measures.  Nasal saline spray (i.e., Simply Saline) or nasal saline lavage (i.e., NeilMed) is recommended as needed.  Consider discontinuing aeroallergen immunotherapy.

## 2017-07-18 NOTE — Patient Instructions (Signed)
Asthma with COPD  For now, continue omalizumab (Xolair) injections, Incruse Ellipta 1 inhalation daily, Symbicort 160-4.5 g, 2 inhalations via spacer device twice a day, montelukast 10 mg daily bedtime and albuterol HFA, 1-2 inhalations every 4-6 hours as needed.  We will consider changing the frequency of Xolair injections from every 4 weeks to every 3 weeks.  Subjective and objective measures of pulmonary function will be followed and the treatment plan will be adjusted accordingly.  Allergic rhinitis  Continue appropriate allergen avoidance measures.  Nasal saline spray (i.e., Simply Saline) or nasal saline lavage (i.e., NeilMed) is recommended as needed.  Consider discontinuing aeroallergen immunotherapy.  History of epistaxis  Nasal saline spray or nasal saline gel is recommended to moisturize the nasal mucosa.  Avoid intranasal steroid use.   Return in about 4 months (around 11/15/2017), or if symptoms worsen or fail to improve.

## 2017-07-18 NOTE — Assessment & Plan Note (Signed)
   For now, continue omalizumab (Xolair) injections, Incruse Ellipta 1 inhalation daily, Symbicort 160-4.5 g, 2 inhalations via spacer device twice a day, montelukast 10 mg daily bedtime and albuterol HFA, 1-2 inhalations every 4-6 hours as needed.  We will consider changing the frequency of Xolair injections from every 4 weeks to every 3 weeks.  Subjective and objective measures of pulmonary function will be followed and the treatment plan will be adjusted accordingly.

## 2017-07-18 NOTE — Assessment & Plan Note (Signed)
   Nasal saline spray or nasal saline gel is recommended to moisturize the nasal mucosa.  Avoid intranasal steroid use.

## 2017-07-18 NOTE — Progress Notes (Signed)
Follow-up Note  RE: Anne Rich MRN: [id number] DOB: 1951-03-25 Date of Office Visit: 07/18/2017  Primary care provider: Ananias PilgrimAsres, Alehegn, MD Referring provider: Ananias PilgrimAsres, Alehegn, MD  History of present illness: Anne Rich is a 67 y.o. female with allergic rhinitis and asthma/COPD presenting today for follow-up.  She was last seen in this clinic in February 2018.  She reports that her lower respiratory symptoms have been well controlled while taking Incruse Ellipta daily, Symbicort 160-4.5 g, 2 inhalations via spacer device twice daily, and Xolair injections every 29 days.  She has been walking 2 or 3 miles a day for exercise without problems. She notes that in the several days leading up to her Xolair injections she begins to experience some mild chest tightness/dyspnea which resolves when she receives her Xolair injection.  She has no nasal allergy symptom planes today.  Had a lengthy discussion with her today regards to the possibility of discontinuing immunotherapy.  I explained that I did not believe that continuing immunotherapy was warranted as she has been on this treatment for over 3 years, however she is insistent that the immunotherapy injections protect her from pneumonia and therefore she would like to continue the aeroallergen immunotherapy.   Assessment and plan: Asthma with COPD  For now, continue omalizumab (Xolair) injections, Incruse Ellipta 1 inhalation daily, Symbicort 160-4.5 g, 2 inhalations via spacer device twice a day, montelukast 10 mg daily bedtime and albuterol HFA, 1-2 inhalations every 4-6 hours as needed.  We will consider changing the frequency of Xolair injections from every 4 weeks to every 3 weeks.  Subjective and objective measures of pulmonary function will be followed and the treatment plan will be adjusted accordingly.  Allergic rhinitis  Continue appropriate allergen avoidance measures.  Nasal saline spray (i.e., Simply Saline) or nasal saline  lavage (i.e., NeilMed) is recommended as needed.  Consider discontinuing aeroallergen immunotherapy.  History of epistaxis  Nasal saline spray or nasal saline gel is recommended to moisturize the nasal mucosa.  Avoid intranasal steroid use.   Meds ordered this encounter  Medications  . montelukast (SINGULAIR) 10 MG tablet    Sig: Take 1 tablet (10 mg total) by mouth at bedtime.    Dispense:  30 tablet    Refill:  3  . budesonide-formoterol (SYMBICORT) 160-4.5 MCG/ACT inhaler    Sig: Inhale 2 puffs into the lungs 2 (two) times daily.    Dispense:  1 Inhaler    Refill:  3    Diagnostics: Spirometry reveals an FVC of 1.83 L and an FEV1 of 0.80 L.  This FEV1 is improved compared with her previous study.  Please see scanned spirometry results for details.    Physical examination: Blood pressure (!) 144/88, pulse 76, temperature 97.6 F (36.4 C), temperature source Oral, resp. rate 16, SpO2 94 %.  General: Alert, interactive, in no acute distress. HEENT: TMs pearly gray, turbinates mildly edematous without discharge, post-pharynx mildly erythematous. Neck: Supple without lymphadenopathy. Lungs: Decreased breath sounds bilaterally without wheezing, rhonchi or rales. CV: Normal S1, S2 without murmurs. Skin: Warm and dry, without lesions or rashes.  The following portions of the patient's history were reviewed and updated as appropriate: allergies, current medications, past family history, past medical history, past social history, past surgical history and problem list.  Allergies as of 07/18/2017      Reactions   Contrast Media [iodinated Diagnostic Agents]    Penicillins    Shellfish-derived Products       Medication List  Accurate as of 07/18/17  6:50 PM. Always use your most recent med list.          albuterol 108 (90 Base) MCG/ACT inhaler Commonly known as:  VENTOLIN HFA Inhale 2 puffs into the lungs every 4 (four) hours as needed for wheezing or shortness of  breath.   aspirin 81 MG tablet Take 81 mg by mouth daily.   ATIVAN PO Take by mouth 3 (three) times daily.   B COMPLEX PO Take by mouth.   budesonide-formoterol 160-4.5 MCG/ACT inhaler Commonly known as:  SYMBICORT Inhale 2 puffs into the lungs 2 (two) times daily.   carbamazepine 200 MG tablet Commonly known as:  TEGRETOL Take 200 mg by mouth 2 (two) times daily.   CENTRUM PO Take by mouth.   cetirizine 10 MG tablet Commonly known as:  ZYRTEC Take 10 mg by mouth daily.   cyclobenzaprine 5 MG tablet Commonly known as:  FLEXERIL Take 5 mg by mouth daily.   EPIPEN 2-PAK 0.3 mg/0.3 mL Soaj injection Generic drug:  EPINEPHrine Inject 0.3 mg into the muscle Once PRN.   esomeprazole 40 MG capsule Commonly known as:  NEXIUM Take 40 mg by mouth daily at 12 noon.   estradiol 2 MG tablet Commonly known as:  ESTRACE Take 2 mg by mouth.   gabapentin 300 MG capsule Commonly known as:  NEURONTIN Take by mouth.   Glycopyrrolate 15.6 MCG Caps Commonly known as:  SEEBRI NEOHALER Place 1 capsule into inhaler and inhale 2 (two) times daily at 10 AM and 5 PM. Rinse, gargle and spit out after use   INCRUSE ELLIPTA 62.5 MCG/INH Aepb Generic drug:  umeclidinium bromide Inhale 1 puff into the lungs daily.   levalbuterol 1.25 MG/3ML nebulizer solution Commonly known as:  XOPENEX Take 1.25 mg by nebulization every 4 (four) hours as needed for wheezing.   levETIRAcetam 1000 MG tablet Commonly known as:  KEPPRA Take 1,000 mg by mouth 2 (two) times daily.   montelukast 10 MG tablet Commonly known as:  SINGULAIR Take 1 tablet (10 mg total) by mouth at bedtime.   omalizumab 150 MG injection Commonly known as:  XOLAIR Inject 150 mg into the skin.   ondansetron 4 MG tablet Commonly known as:  ZOFRAN Take 4 mg by mouth.   OXYCODONE ER PO Take by mouth as needed.   SPIRIVA RESPIMAT IN Inhale 1 puff into the lungs daily.   VITAMIN C PO Take by mouth.       Allergies    Allergen Reactions  . Contrast Media [Iodinated Diagnostic Agents]   . Penicillins   . Shellfish-Derived Products    Review of systems: Review of systems negative except as noted in HPI / PMHx or noted below: Constitutional: Negative.  HENT: Negative.   Eyes: Negative.  Respiratory: Negative.   Cardiovascular: Negative.  Gastrointestinal: Negative.  Genitourinary: Negative.  Musculoskeletal: Negative.  Neurological: Negative.  Endo/Heme/Allergies: Negative.  Cutaneous: Negative.  Past Medical History:  Diagnosis Date  . Asthma     Family History  Problem Relation Age of Onset  . Allergic rhinitis Neg Hx   . Angioedema Neg Hx   . Asthma Neg Hx   . Eczema Neg Hx   . Immunodeficiency Neg Hx   . Urticaria Neg Hx     Social History   Socioeconomic History  . Marital status: Married    Spouse name: Not on file  . Number of children: Not on file  . Years of education: Not on  file  . Highest education level: Not on file  Social Needs  . Financial resource strain: Not on file  . Food insecurity - worry: Not on file  . Food insecurity - inability: Not on file  . Transportation needs - medical: Not on file  . Transportation needs - non-medical: Not on file  Occupational History  . Not on file  Tobacco Use  . Smoking status: Former Games developer  . Smokeless tobacco: Never Used  Substance and Sexual Activity  . Alcohol use: No    Frequency: Never  . Drug use: No  . Sexual activity: Not on file  Other Topics Concern  . Not on file  Social History Narrative  . Not on file    I appreciate the opportunity to take part in Ann-Marie's care. Please do not hesitate to contact me with questions.  Sincerely,   R. Jorene Guest, MD

## 2017-07-25 ENCOUNTER — Ambulatory Visit (INDEPENDENT_AMBULATORY_CARE_PROVIDER_SITE_OTHER): Payer: Medicare Other

## 2017-07-25 DIAGNOSIS — J309 Allergic rhinitis, unspecified: Secondary | ICD-10-CM | POA: Diagnosis not present

## 2017-07-30 ENCOUNTER — Telehealth: Payer: Self-pay | Admitting: *Deleted

## 2017-07-30 NOTE — Telephone Encounter (Signed)
Called patient and advised per Dr Nunzio CobbsBobbitt will try doing her Xolair 150mg  every 2-3 weeks.  Per her insurance buy and bill no PA reqd.  Will try this regimen to see if she has improvement in her asthma control.  R/S her upcoming appt to 3 weeks instead of 4 weeks

## 2017-08-07 DIAGNOSIS — J4540 Moderate persistent asthma, uncomplicated: Secondary | ICD-10-CM | POA: Diagnosis not present

## 2017-08-08 ENCOUNTER — Ambulatory Visit (INDEPENDENT_AMBULATORY_CARE_PROVIDER_SITE_OTHER): Payer: Medicare Other

## 2017-08-08 DIAGNOSIS — J4540 Moderate persistent asthma, uncomplicated: Secondary | ICD-10-CM

## 2017-08-13 ENCOUNTER — Ambulatory Visit (INDEPENDENT_AMBULATORY_CARE_PROVIDER_SITE_OTHER): Payer: Medicare Other

## 2017-08-13 DIAGNOSIS — J309 Allergic rhinitis, unspecified: Secondary | ICD-10-CM | POA: Diagnosis not present

## 2017-08-15 ENCOUNTER — Ambulatory Visit: Payer: Self-pay

## 2017-08-22 ENCOUNTER — Ambulatory Visit (INDEPENDENT_AMBULATORY_CARE_PROVIDER_SITE_OTHER): Payer: Medicare Other | Admitting: *Deleted

## 2017-08-22 DIAGNOSIS — J309 Allergic rhinitis, unspecified: Secondary | ICD-10-CM

## 2017-08-28 DIAGNOSIS — J4540 Moderate persistent asthma, uncomplicated: Secondary | ICD-10-CM

## 2017-08-29 ENCOUNTER — Ambulatory Visit (INDEPENDENT_AMBULATORY_CARE_PROVIDER_SITE_OTHER): Payer: Medicare Other

## 2017-08-29 DIAGNOSIS — J4540 Moderate persistent asthma, uncomplicated: Secondary | ICD-10-CM | POA: Diagnosis not present

## 2017-09-03 ENCOUNTER — Ambulatory Visit (INDEPENDENT_AMBULATORY_CARE_PROVIDER_SITE_OTHER): Payer: Medicare Other

## 2017-09-03 DIAGNOSIS — J309 Allergic rhinitis, unspecified: Secondary | ICD-10-CM | POA: Diagnosis not present

## 2017-09-10 DIAGNOSIS — R6889 Other general symptoms and signs: Secondary | ICD-10-CM | POA: Insufficient documentation

## 2017-09-18 DIAGNOSIS — J4540 Moderate persistent asthma, uncomplicated: Secondary | ICD-10-CM

## 2017-09-19 ENCOUNTER — Ambulatory Visit (INDEPENDENT_AMBULATORY_CARE_PROVIDER_SITE_OTHER): Payer: Medicare Other

## 2017-09-19 DIAGNOSIS — J4540 Moderate persistent asthma, uncomplicated: Secondary | ICD-10-CM

## 2017-10-09 DIAGNOSIS — J4540 Moderate persistent asthma, uncomplicated: Secondary | ICD-10-CM | POA: Diagnosis not present

## 2017-10-10 ENCOUNTER — Ambulatory Visit (INDEPENDENT_AMBULATORY_CARE_PROVIDER_SITE_OTHER): Payer: Medicare Other | Admitting: *Deleted

## 2017-10-10 ENCOUNTER — Ambulatory Visit: Payer: Medicare Other | Admitting: Allergy and Immunology

## 2017-10-10 DIAGNOSIS — J4540 Moderate persistent asthma, uncomplicated: Secondary | ICD-10-CM

## 2017-10-10 DIAGNOSIS — J309 Allergic rhinitis, unspecified: Secondary | ICD-10-CM

## 2017-10-10 MED ORDER — OMALIZUMAB 150 MG ~~LOC~~ SOLR
150.0000 mg | SUBCUTANEOUS | Status: DC
  Administered 2017-10-10: 150 mg via SUBCUTANEOUS

## 2017-10-10 NOTE — Progress Notes (Signed)
Immunotherapy   Patient Details  Name: Anne Rich MRN: [id number] Date of Birth: March 10, 1951  10/10/2017  [name] received xolair 150 mg in left arm, 10 minutes after injection pt complained of a tingling feeling in her lips and tongue- reported to Dr. Nunzio Cobbs in clinic and he examined her and her vitals were taken. BP -158/92 R -16 P -80.  She waited 30 minutes and she "felt fine".   [name] 10/10/2017, 10:47 AM

## 2017-10-14 DIAGNOSIS — I6523 Occlusion and stenosis of bilateral carotid arteries: Secondary | ICD-10-CM | POA: Insufficient documentation

## 2017-10-17 ENCOUNTER — Ambulatory Visit: Payer: Self-pay

## 2017-10-20 ENCOUNTER — Other Ambulatory Visit: Payer: Self-pay | Admitting: Allergy and Immunology

## 2017-10-20 DIAGNOSIS — J449 Chronic obstructive pulmonary disease, unspecified: Secondary | ICD-10-CM

## 2017-10-30 DIAGNOSIS — J4540 Moderate persistent asthma, uncomplicated: Secondary | ICD-10-CM

## 2017-10-31 ENCOUNTER — Ambulatory Visit (INDEPENDENT_AMBULATORY_CARE_PROVIDER_SITE_OTHER): Payer: Medicare Other

## 2017-10-31 DIAGNOSIS — J4540 Moderate persistent asthma, uncomplicated: Secondary | ICD-10-CM | POA: Diagnosis not present

## 2017-10-31 MED ORDER — OMALIZUMAB 150 MG ~~LOC~~ SOLR
150.0000 mg | SUBCUTANEOUS | Status: DC
  Administered 2017-10-31 – 2018-09-17 (×17): 150 mg via SUBCUTANEOUS

## 2017-11-20 DIAGNOSIS — J4540 Moderate persistent asthma, uncomplicated: Secondary | ICD-10-CM

## 2017-11-21 ENCOUNTER — Ambulatory Visit: Payer: Self-pay

## 2017-11-21 ENCOUNTER — Ambulatory Visit (INDEPENDENT_AMBULATORY_CARE_PROVIDER_SITE_OTHER): Payer: Medicare Other

## 2017-11-21 DIAGNOSIS — J4540 Moderate persistent asthma, uncomplicated: Secondary | ICD-10-CM | POA: Diagnosis not present

## 2017-12-09 DIAGNOSIS — J984 Other disorders of lung: Secondary | ICD-10-CM | POA: Insufficient documentation

## 2017-12-11 ENCOUNTER — Ambulatory Visit: Payer: Self-pay

## 2017-12-13 ENCOUNTER — Ambulatory Visit: Payer: Self-pay | Admitting: *Deleted

## 2017-12-13 DIAGNOSIS — J4540 Moderate persistent asthma, uncomplicated: Secondary | ICD-10-CM

## 2017-12-16 ENCOUNTER — Ambulatory Visit (INDEPENDENT_AMBULATORY_CARE_PROVIDER_SITE_OTHER): Payer: Medicare Other

## 2017-12-16 DIAGNOSIS — J4540 Moderate persistent asthma, uncomplicated: Secondary | ICD-10-CM

## 2018-01-01 DIAGNOSIS — T50995A Adverse effect of other drugs, medicaments and biological substances, initial encounter: Secondary | ICD-10-CM | POA: Insufficient documentation

## 2018-01-03 DIAGNOSIS — J4540 Moderate persistent asthma, uncomplicated: Secondary | ICD-10-CM

## 2018-01-06 ENCOUNTER — Ambulatory Visit (INDEPENDENT_AMBULATORY_CARE_PROVIDER_SITE_OTHER): Payer: Medicare Other

## 2018-01-06 DIAGNOSIS — J4540 Moderate persistent asthma, uncomplicated: Secondary | ICD-10-CM | POA: Diagnosis not present

## 2018-01-22 ENCOUNTER — Encounter: Payer: Self-pay | Admitting: Allergy & Immunology

## 2018-01-22 ENCOUNTER — Ambulatory Visit (INDEPENDENT_AMBULATORY_CARE_PROVIDER_SITE_OTHER): Payer: Medicare Other | Admitting: Allergy & Immunology

## 2018-01-22 VITALS — BP 110/80 | HR 80 | Temp 97.9°F | Resp 20

## 2018-01-22 DIAGNOSIS — J4540 Moderate persistent asthma, uncomplicated: Secondary | ICD-10-CM | POA: Diagnosis not present

## 2018-01-22 DIAGNOSIS — T508X5D Adverse effect of diagnostic agents, subsequent encounter: Secondary | ICD-10-CM | POA: Diagnosis not present

## 2018-01-22 DIAGNOSIS — J3089 Other allergic rhinitis: Secondary | ICD-10-CM | POA: Diagnosis not present

## 2018-01-22 NOTE — Progress Notes (Signed)
FOLLOW UP  Date of Service/Encounter:  01/22/18   Assessment:   Moderate persistent asthma, uncomplicated  Allergic reaction to contrast material  Non-seasonal allergic rhinitis - s/p 3+ years allergen immunotherapy  Plan/Recommendations:   1. Moderate persistent asthma, uncomplicated - Lung testing looked stable for you today. - We will not make any medication changes at this time. - Daily controller medication(s): Symbicort 160/4.745mcg two puffs twice daily with spacer - Prior to physical activity: ProAir 2 puffs 10-15 minutes before physical activity. - Rescue medications: ProAir 4 puffs every 4-6 hours as needed - Asthma control goals:  * Full participation in all desired activities (may need albuterol before activity) * Albuterol use two time or less a week on average (not counting use with activity) * Cough interfering with sleep two time or less a month * Oral steroids no more than once a year * No hospitalizations  2. Allergic reaction to contrast material - We will make your prednisone taper a little longer:  20mg  (two tablets) twice daily for six days, 10mg  (one tablet) twice daily for six days, 10mg  once daily for six days, then STOP  - Add on suppressive doses of antihistamines: Allegra 180mg  (one tablet) in the morning and Zyrtec (cetirizine) 10mg  at night (do this for two weeks to control the itching).    3. Allergic rhinitis - stopped allergen immunotherapy in March 2019 - Continue with montelukast 10mg  daily.  4. Return in about 6 months (around 07/25/2018).    Subjective:   Anne Rich is a 67 y.o. female presenting today for follow up of  Chief Complaint  Patient presents with  . Allergic Rhinitis   . Asthma  . Rash    Anne Rich has a history of the following: Patient Active Problem List   Diagnosis Date Noted  . Moderate persistent asthma, uncomplicated 01/22/2018  . History of epistaxis 07/19/2016  . Non-seasonal allergic rhinitis  02/10/2015  . 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  Bryan Halvorson's Primary Care Provider is Ananias PilgrimAsres, Alehegn, MD.     Anne Rich is a 67 y.o. female presenting for a follow up visit.  She was last seen in February 2019 by Dr. Nunzio CobbsBobbitt.  At that time, she was continued on Symbicort 160/4.5 mcg 2 puffs twice daily, montelukast 10 mg daily, and Xolair every 4 weeks.  For her rhinitis, she was continued on nasal saline rinses as needed.  Her allergen immunotherapy was also discontinued.  Since the last visit, she has developed a reaction to a a contrast dye from a cardiac study.  She was admitted to the hospital on August 6 for a cardiac catheterization.  Thankfully, the heart cath was normal.  However, she did develop an allergic reaction to the contrast material.  She was put on prednisone for a prolonged taper for this reaction. She was on prednisone 40mg  before and during the procedure as a means to prevent this reaction. She started her current regimen yesterday. She took 20mg  last night and felt fairly decent overnight with this regimen.   She remains on the prednisone 20mg  daily.  She was just taking 10 mg daily.  Evidently, her primary care provider wanted to put her on a higher dose but she declined.  However, she is more open to increasing the prednisone at this point.  She is not taking any antihistamines on a regular basis.  She does take Benadryl at night, which does help somewhat.  Her asthma is under good control.  She reports that she is feels great with the Symbicort and the Xolair together.  She uses her albuterol less than one time per week.  She reports good sleep, at least prior to the onset of her rash.  She has had no courses of prednisone for her breathing and no ER visits since the last time we saw her.  Allergic rhinitis symptoms have been controlled with montelukast only.  She was having quite a few large local reactions to her shots,  and her last injection was in March 2019.  She is okay staying off of her shots for now.  Otherwise, there have been no changes to her past medical history, surgical history, family history, or social history.    Review of Systems: a 14-point review of systems is pertinent for what is mentioned in HPI.  Otherwise, all other systems were negative. Constitutional: negative other than that listed in the HPI Eyes: negative other than that listed in the HPI Ears, nose, mouth, throat, and face: negative other than that listed in the HPI Respiratory: negative other than that listed in the HPI Cardiovascular: negative other than that listed in the HPI Gastrointestinal: negative other than that listed in the HPI Genitourinary: negative other than that listed in the HPI Integument: negative other than that listed in the HPI Hematologic: negative other than that listed in the HPI Musculoskeletal: negative other than that listed in the HPI Neurological: negative other than that listed in the HPI Allergy/Immunologic: negative other than that listed in the HPI    Objective:   Blood pressure 110/80, pulse 80, temperature 97.9 F (36.6 C), temperature source Oral, resp. rate 20, SpO2 96 %. There is no height or weight on file to calculate BMI.   Physical Exam:  General: Alert, interactive, in no acute distress.  Very talkative. Eyes: No conjunctival injection bilaterally, no discharge on the right, no discharge on the left and no Horner-Trantas dots present. PERRL bilaterally. EOMI without pain. No photophobia.  Ears: Right TM pearly gray with normal light reflex, Left TM pearly gray with normal light reflex, Right TM intact without perforation and Left TM intact without perforation.  Nose/Throat: External nose within normal limits and septum midline. Turbinates edematous without discharge. Posterior oropharynx mildly erythematous without cobblestoning in the posterior oropharynx. Tonsils 2+  without exudates.  Tongue without thrush. Lungs: Clear to auscultation without wheezing, rhonchi or rales. No increased work of breathing. CV: Normal S1/S2. No murmurs. Capillary refill <2 seconds.  Skin: Scattered maculopapular rash over the chest, arms, and back. Neuro:   Grossly intact. No focal deficits appreciated. Responsive to questions.  Diagnostic studies:   Spirometry: results abnormal (FEV1: 0.77/28%, FVC: 1.86/52%, FEV1/FVC: 41%).    Spirometry consistent with mixed obstructive and restrictive disease.  Overall, values are stable compared to her last visit.  Allergy Studies: none      Malachi BondsJoel Mary-Ann Pennella, MD  [facility]

## 2018-01-22 NOTE — Patient Instructions (Addendum)
1. Moderate persistent asthma, uncomplicated - Lung testing looked stable for you today. - We will not make any medication changes at this time. - Daily controller medication(s): Symbicort 160/4.745mcg two puffs twice daily with spacer - Prior to physical activity: ProAir 2 puffs 10-15 minutes before physical activity. - Rescue medications: ProAir 4 puffs every 4-6 hours as needed - Asthma control goals:  * Full participation in all desired activities (may need albuterol before activity) * Albuterol use two time or less a week on average (not counting use with activity) * Cough interfering with sleep two time or less a month * Oral steroids no more than once a year * No hospitalizations  2. Allergic reaction to contrast material - We will make your prednisone taper a little longer:  20mg  (two tablets) twice daily for six days, 10mg  (one tablet) twice daily for six days, 10mg  once daily for six days, then STOP  - Add on suppressive doses of antihistamines: Allegra 180mg  (one tablet) in the morning and Zyrtec (cetirizine) 10mg  at night (do this for two weeks to control the itching).    3. Allergic rhinitis - stopped allergen immunotherapy in March 2019 - Continue with montelukast 10mg  daily.  4. Return in about 6 months (around 07/25/2018).   Please inform us of any Emergency Department visits, hospitalizations, or changes in symptoms. Call us before going to the ED for breathing or allergy symptoms since we might be able to fit you in for a sick visit. Feel free to contact us anytime with any questions, problems, or concerns.  It was a pleasure to meet you today!  Websites that have reliable patient information: 1. American Academy of Asthma, Allergy, and Immunology: www.aaaai.org 2. Food Allergy Research and Education (FARE): foodallergy.org 3. Mothers of Asthmatics: http://www.asthmacommunitynetwork.org 4. American College of Allergy, Asthma, and Immunology: MissingWeapons.cawww.acaai.org   Make sure  you are registered to vote! If you have moved or changed any of your contact information, you will need to get this updated before voting!

## 2018-01-24 DIAGNOSIS — J4540 Moderate persistent asthma, uncomplicated: Secondary | ICD-10-CM

## 2018-01-27 ENCOUNTER — Ambulatory Visit (INDEPENDENT_AMBULATORY_CARE_PROVIDER_SITE_OTHER): Payer: Medicare Other | Admitting: *Deleted

## 2018-01-27 ENCOUNTER — Telehealth: Payer: Self-pay | Admitting: *Deleted

## 2018-01-27 DIAGNOSIS — J4540 Moderate persistent asthma, uncomplicated: Secondary | ICD-10-CM

## 2018-01-27 NOTE — Telephone Encounter (Signed)
Topical hydrocortisone ointment may be helpful to reduce the inflammation.

## 2018-01-27 NOTE — Telephone Encounter (Signed)
(  she had a procedure done and this a reaction from the dye, she says it always takes 3 months to go away)

## 2018-01-27 NOTE — Telephone Encounter (Signed)
Pt informed

## 2018-01-27 NOTE — Telephone Encounter (Signed)
Pt came in for her xolair shot today and she was wearing gloves to cover up her hands. She says they are red , flacky, red and sore and it is also on her back, she states that is what happens with her reactions. She wants to know what to do. Please advise.

## 2018-02-04 ENCOUNTER — Telehealth: Payer: Self-pay | Admitting: *Deleted

## 2018-02-04 NOTE — Telephone Encounter (Signed)
Pt states she is having a very hard time with her lungs and is wanting to know if Dr.Bobbitt has a Pulmonologist that works within [facility] that we can refer her to be seen ASAP.

## 2018-02-04 NOTE — Telephone Encounter (Signed)
Dr. Kendrick FriesMcQuaid, [facility]

## 2018-02-06 NOTE — Telephone Encounter (Signed)
Called patient.  Left message on voice mail with information on Dr. Joen Lauraennis McQuiad per Dr. Nunzio CobbsBobbitt.   [facility] [address], [place], [place] [address]  Phone: [phone]

## 2018-02-13 DIAGNOSIS — Z789 Other specified health status: Secondary | ICD-10-CM | POA: Insufficient documentation

## 2018-02-14 ENCOUNTER — Ambulatory Visit (INDEPENDENT_AMBULATORY_CARE_PROVIDER_SITE_OTHER): Payer: Medicare Other | Admitting: Allergy & Immunology

## 2018-02-14 ENCOUNTER — Telehealth: Payer: Self-pay

## 2018-02-14 ENCOUNTER — Encounter: Payer: Self-pay | Admitting: Allergy & Immunology

## 2018-02-14 VITALS — BP 130/98 | HR 92 | Temp 98.1°F | Resp 20

## 2018-02-14 DIAGNOSIS — J449 Chronic obstructive pulmonary disease, unspecified: Secondary | ICD-10-CM | POA: Diagnosis not present

## 2018-02-14 DIAGNOSIS — J0100 Acute maxillary sinusitis, unspecified: Secondary | ICD-10-CM

## 2018-02-14 DIAGNOSIS — J3089 Other allergic rhinitis: Secondary | ICD-10-CM

## 2018-02-14 DIAGNOSIS — J4540 Moderate persistent asthma, uncomplicated: Secondary | ICD-10-CM

## 2018-02-14 MED ORDER — MOXIFLOXACIN HCL 400 MG PO TABS: 400.0000 mg | ORAL_TABLET | Freq: Every day | ORAL | 0 refills | Status: AC

## 2018-02-14 MED ORDER — TIOTROPIUM BROMIDE MONOHYDRATE 2.5 MCG/ACT IN AERS: 1.0000 | INHALATION_SPRAY | Freq: Every day | RESPIRATORY_TRACT | 3 refills | Status: DC

## 2018-02-14 MED ORDER — METHYLPREDNISOLONE ACETATE 40 MG/ML IJ SUSP
40.0000 mg | Freq: Once | INTRAMUSCULAR | Status: AC
  Administered 2018-02-14: 40 mg via INTRAMUSCULAR

## 2018-02-14 NOTE — Progress Notes (Signed)
FOLLOW UP  Date of Service/Encounter:  02/14/18   Assessment:   Moderate persistent asthma, uncomplicated  Allergic reaction to contrast material  Non-seasonal allergic rhinitis - s/p 3+ years allergen immunotherapy  Acute sinusitis - status post 1 recent round of azithromycin   Anne Rich presents with marked breathing difficulties today. She was moving scant air throughout her lung fields when I first saw her and her breathing looks rather terrible with an FEV1 in the 20% range. However, she did improve quite impressively following a Xopenex and Atrovent treatment. We did provide her with Xopenex samples to take home for the weekend and we will attempt to get Xopenex approved (she has tachycardia with the albuterol). She has never used Atrovent to her knowledge so we will send that in as well. We did give her a DepoMedrol injection in clinic today and provided her with a script for moxifloxacin to treat her inflammatory process that is causing her to exacerbate. Her diagnosis COPD is not in doubt, but with the impressive reversibility, I would conjecture that she has a component of asthma as well.  We will refer to Dr. [name] for an evaluation of her breathing status.   Plan/Recommendations:   1. Moderate persistent asthma with acute excacerbation - Lung testing looked terrible today, but it did improve with albuterol. - We will send you home with Xopenex medication for your nebulizer and attempt to get this approved by her insurance. - We will send in a prescription for Atrovent to mix with your Xopenex to help you get through your current asthma attack. - DepoMedrol 40mg  given in clinic today. - Add on Spiriva 2.79mcg one puff once daily.  - We will refer you to Pulmonology (Dr. [name]). - Daily controller medication(s): Symbicort 160/4.93mcg two puffs twice daily with spacer + Spiriva 2.27mcg one puff once daily - Prior to physical activity: ProAir 2 puffs 10-15 minutes before  physical activity. - Rescue medications: ProAir 4 puffs every 4-6 hours as needed or DuoNeb nebulizer one vial every 4-6 hours as needed (DuoNeb = Xopenex + Atrovent) - Asthma control goals:  * Full participation in all desired activities (may need albuterol before activity) * Albuterol use two time or less a week on average (not counting use with activity) * Cough interfering with sleep two time or less a month * Oral steroids no more than once a year * No hospitalizations  2. Allergic rhinitis - stopped allergen immunotherapy in March 2019 with overlying sinusitis - Continue with montelukast 10mg  daily. - Add Avelox twice daily for seven days.  - Add on nasal saline spray (i.e., Simply Saline) or nasal saline lavage (i.e., NeilMed) as needed prior to medicated nasal sprays. - For thick post nasal drainage, add guaifenesin [phone] mg (Mucinex) twice daily as needed for mucous thinning with adequate hydration to help it work.   3. Return in about 3 months (around 05/16/2018).  Subjective:   Anne Rich is a 67 y.o. female presenting today for follow up of  Chief Complaint  Patient presents with  . Allergic Rhinitis   . Asthma    Anne Rich has a history of the following: Patient Active Problem List   Diagnosis Date Noted  . Moderate persistent asthma, uncomplicated 01/22/2018  . History of epistaxis 07/19/2016  . Non-seasonal allergic rhinitis 02/10/2015  . 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  Anne Rich is a 67 y.o. female presenting for a sick visit.  She was last seen in August 2019.  At that time, her lung function looks stable for her.  We did not make any medications.  We continued her on Symbicort 160/4.5 mcg 2 puffs twice daily with a spacer.  She also was having a continued allergic reaction to her contrast.  We started her on a longer prednisone taper over the  course of 18 days.  We also added Allegra 180 mg in the morning and Zyrtec 10 mg at night.  For her allergic rhinitis, we continue to montelukast 10 mg daily.   At the last visit, she had a head to toe rash which she felt was related to IV contrast.  We started her on a prolonged prednisone taper.  She did go to see dermatology and was placed on a topical steroid.  All of her symptoms have improved from that perspective.  Since the last visit, she has continued to have problems. Her pulmonary complaints have continued to be a problem. She was previously followed by Dr. Deboraha Rich, who has since retired, and she has seen Anne Pearl PA, but she would like to see a physician instead. She feels that she is just getting "bandaids" and need a more thorough evaluation. She is willing to go to [facility] for this.   She was recently placed on prednisone for bronchitis and she completed a course of an antibiotic.  However, she continues to have issues.  She tells me today that she has had a chest x-ray which is been clear.  Evidently, 1 of her neighbors was hospitalized for pneumonia.  She was discharged and came home.  However, when Anne Rich saw her roaming the halls, she told her to get back into her house "so that she does not infect people".  Asthma/Respiratory Symptom History: She remains on Symbicort 160/4.5 two puffs twice daily. She has used Spiriva in the past, but this was a Handihaler.  She tells me today that she has been diagnosed with COPD, but she does not "believe the diagnosis".  She does have a very long history of smoking, but no longer does this.  She does not remember the last time she had full pulmonary function testing performed.    Allergic Rhinitis Symptom History: For her allergic rhinitis, she is continuing with montelukast 10 mg daily.  She reports current sinus pressure and postnasal drip.  She continues to feel fairly good following cessation of her allergen immunotherapy in March  2019.  Otherwise, there have been no changes to her past medical history, surgical history, family history, or social history.    Review of Systems: a 14-point review of systems is pertinent for what is mentioned in HPI.  Otherwise, all other systems were negative. Constitutional: negative other than that listed in the HPI Eyes: negative other than that listed in the HPI Ears, nose, mouth, throat, and face: negative other than that listed in the HPI Respiratory: negative other than that listed in the HPI Cardiovascular: negative other than that listed in the HPI Gastrointestinal: negative other than that listed in the HPI Genitourinary: negative other than that listed in the HPI Integument: negative other than that listed in the HPI Hematologic: negative other than that listed in the HPI Musculoskeletal: negative other than that listed in the HPI Neurological: negative other than that listed in the HPI Allergy/Immunologic: negative other than that listed in the HPI    Objective:   Blood pressure Marland Kitchen)  130/98, pulse 92, temperature 98.1 F (36.7 C), temperature source Oral, resp. rate 20, SpO2 92 %. There is no height or weight on file to calculate BMI.   Physical Exam:  General: Alert, interactive, in moderate acute distress.  Pleasant female. Eyes: No conjunctival injection bilaterally, no discharge on the right, no discharge on the left and no Horner-Trantas dots present. PERRL bilaterally. EOMI without pain. No photophobia.  Ears: Right TM pearly gray with normal light reflex, Left TM pearly gray with normal light reflex, Right TM intact without perforation and Left TM intact without perforation.  Nose/Throat: External nose within normal limits and septum midline. Turbinates edematous with thick discharge. Posterior oropharynx erythematous with cobblestoning in the posterior oropharynx. Tonsils 2+ without exudates.  Tongue without thrush. Lungs: Decreased breath sounds bilaterally  without wheezing, rhonchi or rales. Increased work of breathing.  Minimal air movement initially, but she was moving much better air following her nebulizer treatments. CV: Normal S1/S2. No murmurs. Capillary refill <2 seconds.  Skin: Warm and dry, without lesions or rashes. Neuro:   Grossly intact. No focal deficits appreciated. Responsive to questions.  Diagnostic studies:   Spirometry: results abnormal (FEV1: 0.59/22%, FVC: 0.98/27%, FEV1/FVC: 60%).    Spirometry consistent with mixed obstructive and restrictive disease. Xopenex/Atrovent nebulizer treatment given in clinic with significant improvement in FEV1 and FVC per ATS criteria. Her FEV1 increased from 0.59 to 0.75 (27%) while her FVC increased from 0.98 to 1.96 (55%).   Allergy Studies: none     Malachi Bonds, MD  [facility]

## 2018-02-14 NOTE — Patient Instructions (Addendum)
1. Moderate persistent asthma with acute excacerbation - Lung testing looked terrible today, but it did improve with albuterol. - We will send you home with Xopenex medication for your nebulizer and attempt to get this approved by her insurance. - We will send in a prescription for Atrovent to mix with your Xopenex to help you get through your current asthma attack. - DepoMedrol 40mg  given in clinic today. - Add on Spiriva 2.72mcg one puff once daily.  - We will refer you to Pulmonology (Dr. [name]). - Daily controller medication(s): Symbicort 160/4.3mcg two puffs twice daily with spacer + Spiriva 2.61mcg one puff once daily - Prior to physical activity: ProAir 2 puffs 10-15 minutes before physical activity. - Rescue medications: ProAir 4 puffs every 4-6 hours as needed or DuoNeb nebulizer one vial every 4-6 hours as needed (DuoNeb = Xopenex + Atrovent) - Asthma control goals:  * Full participation in all desired activities (may need albuterol before activity) * Albuterol use two time or less a week on average (not counting use with activity) * Cough interfering with sleep two time or less a month * Oral steroids no more than once a year * No hospitalizations  2. Allergic rhinitis - stopped allergen immunotherapy in March 2019 with overlying sinusitis - Continue with montelukast 10mg  daily. - Add Avelox twice daily for seven days.  - Add on nasal saline spray (i.e., Simply Saline) or nasal saline lavage (i.e., NeilMed) as needed prior to medicated nasal sprays. - For thick post nasal drainage, add guaifenesin [phone] mg (Mucinex) twice daily as needed for mucous thinning with adequate hydration to help it work.   3. Return in about 3 months (around 05/16/2018).   Please inform us of any Emergency Department visits, hospitalizations, or changes in symptoms. Call us before going to the ED for breathing or allergy symptoms since we might be able to fit you in for a sick visit. Feel free to  contact us anytime with any questions, problems, or concerns.  It was a pleasure to see you again today!  Websites that have reliable patient information: 1. American Academy of Asthma, Allergy, and Immunology: www.aaaai.org 2. Food Allergy Research and Education (FARE): foodallergy.org 3. Mothers of Asthmatics: http://www.asthmacommunitynetwork.org 4. American College of Allergy, Asthma, and Immunology: MissingWeapons.ca   Make sure you are registered to vote! If you have moved or changed any of your contact information, you will need to get this updated before voting!

## 2018-02-14 NOTE — Telephone Encounter (Signed)
Pt. Crying uncontrollably where you could not understand the pt. At all. I told the pt. To slow down and to breathe slowly everything is going to be okay. She stated she was having trouble breathing and couldn't come in bc she was with a person receiving dialysis. I told her then she would need to call 911 if she couldn't breathe and then asked me if she was going to die. I told pt. That is out of my hands. Pt. then agreed to come in at 2:00 to be seen by Dr. Dellis Anes.

## 2018-02-15 MED ORDER — IPRATROPIUM BROMIDE 0.02 % IN SOLN: 0.5000 mg | Freq: Four times a day (QID) | RESPIRATORY_TRACT | 1 refills | Status: DC | PRN

## 2018-02-15 MED ORDER — LEVALBUTEROL HCL 1.25 MG/3ML IN NEBU: INHALATION_SOLUTION | RESPIRATORY_TRACT | 1 refills | Status: DC

## 2018-02-15 MED ORDER — LEVALBUTEROL TARTRATE 45 MCG/ACT IN AERO: 2.0000 | INHALATION_SPRAY | Freq: Four times a day (QID) | RESPIRATORY_TRACT | 5 refills | Status: DC | PRN

## 2018-02-15 MED ORDER — MOXIFLOXACIN HCL 400 MG PO TABS: 400.0000 mg | ORAL_TABLET | Freq: Every day | ORAL | 0 refills | Status: AC

## 2018-02-15 NOTE — Progress Notes (Addendum)
Pt called this AM regarding prescription not sent.  I sent in Avelox per Dr.Gallaghers recommendation.  Confirmed with pt she has tolerated previous use of avelox (levofloxacin listed as allergy she reports it caused leg cramps).   Margo Aye, MD [facility]

## 2018-02-17 ENCOUNTER — Encounter: Payer: Self-pay | Admitting: Allergy & Immunology

## 2018-02-17 ENCOUNTER — Ambulatory Visit (INDEPENDENT_AMBULATORY_CARE_PROVIDER_SITE_OTHER): Payer: Medicare Other

## 2018-02-17 DIAGNOSIS — J4540 Moderate persistent asthma, uncomplicated: Secondary | ICD-10-CM | POA: Diagnosis not present

## 2018-02-17 NOTE — Telephone Encounter (Signed)
Pt came in for Xolair today. Informed her of the pulmonologist Dr. Nunzio Cobbs wanted her to see and gave her the phone #

## 2018-02-17 NOTE — Telephone Encounter (Signed)
Pt. Needing more samples of xopenex 1.25mg /56ml and ipratropium for the nebulizer machine until her medicine arrives from [facility] in a couple of days. Pt. States she is doing better until she coughs. Left samples of meds up front for pt.to pick up.

## 2018-02-17 NOTE — Progress Notes (Signed)
Referral is in place to [facility] .  Thanks

## 2018-02-21 ENCOUNTER — Telehealth: Payer: Self-pay

## 2018-02-21 NOTE — Telephone Encounter (Signed)
That is fine with me.  Joel Gallagher, MD [facility]  

## 2018-02-21 NOTE — Telephone Encounter (Signed)
Pt. Calling feeling much better but still has a cough when she vacuum's. She feels she still needs to take some prednisone to help with her breathing. Pt. Has 12-13 prednisone's left. I told her to take 1 twice a day for 4 days then 1 on day 5 like our baby pack.  Pt. Was agreeable with that plan.

## 2018-02-26 ENCOUNTER — Telehealth: Payer: Self-pay

## 2018-02-26 MED ORDER — LEVALBUTEROL TARTRATE 45 MCG/ACT IN AERO: 2.0000 | INHALATION_SPRAY | Freq: Four times a day (QID) | RESPIRATORY_TRACT | 0 refills | Status: DC | PRN

## 2018-02-26 NOTE — Telephone Encounter (Signed)
Pt. Calling this morning to let us know she is feeling and breathing much better. Pt. Wanted to have more than one rescue inhaler because pt. Stated the xopenex hfa inhaler only comes with 60-80 puffs. Pt. Takes her xopenex hfa prior to exercise every day. Pt. Is using it more than once a day as of now until her breathing returns to normal. Pt. States she is using the xopenex  hfa less and states her breathing is almost back to normal.

## 2018-02-28 DIAGNOSIS — J4540 Moderate persistent asthma, uncomplicated: Secondary | ICD-10-CM

## 2018-03-03 ENCOUNTER — Ambulatory Visit (INDEPENDENT_AMBULATORY_CARE_PROVIDER_SITE_OTHER): Payer: Medicare Other | Admitting: *Deleted

## 2018-03-03 DIAGNOSIS — J4540 Moderate persistent asthma, uncomplicated: Secondary | ICD-10-CM

## 2018-03-06 ENCOUNTER — Encounter: Payer: Self-pay | Admitting: Pulmonary Disease

## 2018-03-06 ENCOUNTER — Ambulatory Visit (INDEPENDENT_AMBULATORY_CARE_PROVIDER_SITE_OTHER): Payer: Medicare Other | Admitting: Pulmonary Disease

## 2018-03-06 VITALS — BP 120/76 | HR 96 | Ht 68.0 in | Wt 152.0 lb

## 2018-03-06 DIAGNOSIS — J4540 Moderate persistent asthma, uncomplicated: Secondary | ICD-10-CM | POA: Diagnosis not present

## 2018-03-06 DIAGNOSIS — J471 Bronchiectasis with (acute) exacerbation: Secondary | ICD-10-CM | POA: Diagnosis not present

## 2018-03-06 DIAGNOSIS — Z23 Encounter for immunization: Secondary | ICD-10-CM | POA: Diagnosis not present

## 2018-03-06 DIAGNOSIS — J449 Chronic obstructive pulmonary disease, unspecified: Secondary | ICD-10-CM

## 2018-03-06 NOTE — Patient Instructions (Signed)
COPD-asthma: For now continue Symbicort and Incruse High-dose flu shot today Practice good hand hygiene Stay active  Recurrent respiratory infections: When I return to our office in [place] on September 27 I will review the images of the CT scan of your chest which was performed in 2019 to see if there was evidence of bronchiectasis  Chronic respiratory failure with hypoxemia: We will check your oxygen level while walking today and at night sleeping on room air  We will see you back in about 2 weeks to go over these results.

## 2018-03-06 NOTE — Progress Notes (Signed)
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  Has a diagnosis of severe COPD.  First saw the [facility] in September 2019, was previously followed by [facility].   Subjective:    Patient ID: Anne Rich, female    DOB: 09-24-1950, 67 y.o.   MRN: [id number]  HPI Chief Complaint  Patient presents with  . pulm consult    Pt referred by Dr. York Pellant, MD for Asthma with COPD. Pt has chest tightness, SOB with extertion, and some wheezing.    Anne Rich comes to my clinic today for evaluation of ongoing shortness of breath.  She has a past medical history significant for childhood polio and says that she lives in an iron lung for approximately 18 months.  She had pneumonia repeatedly during this time and says that she has had it repeatedly throughout her life.  She has been hospitalized for pneumonia, most recently in 2016.  After that time she was discharged on oxygen to use on exertion.  Since then she has used it off and on over the years.  She says that she has been using oxygen when she sleeps for about 25 years now.  She says that as a young adult she started having problems breathing specifically recurrent episodes of bronchitis.  She has been following with a local pulmonologist for about 15 years or so since moving here at age 67.  She was diagnosed with severe COPD and has been treated with Symbicort and Incruse.  Recently she had an episode of COPD exacerbation and saw the local pulmonary office at [facility].  She also followed up with our local allergy team.  She tells me that she has had significant shortness of breath when she exerts herself.  She is reluctant to use oxygen while exerting herself.  She says that she continues to take the Incruse and Symbicort.  She recently tried Spiriva but she did not like it because it caused some soreness in her throat.  She says that albuterol helps.  Past  Medical History:  Diagnosis Date  . Asthma      Family History  Problem Relation Age of Onset  . Allergic rhinitis Neg Hx   . Angioedema Neg Hx   . Asthma Neg Hx   . Eczema Neg Hx   . Immunodeficiency Neg Hx   . Urticaria Neg Hx      Social History   Socioeconomic History  . Marital status: Married    Spouse name: Not on file  . Number of children: Not on file  . Years of education: Not on file  . Highest education level: Not on file  Occupational History  . Not on file  Social Needs  . Financial resource strain: Not on file  . Food insecurity:    Worry: Not on file    Inability: Not on file  . Transportation needs:    Medical: Not on file    Non-medical: Not on file  Tobacco Use  . Smoking status: Former Smoker    Packs/day: 0.50    Years: 15.00    Pack years: 7.50    Types: Cigarettes  . Smokeless tobacco: Never Used  Substance and Sexual Activity  . Alcohol use: No    Frequency: Never  . Drug use: No  . Sexual activity: Not on file  Lifestyle  . Physical activity:    Days per week: Not  on file    Minutes per session: Not on file  . Stress: Not on file  Relationships  . Social connections:    Talks on phone: Not on file    Gets together: Not on file    Attends religious service: Not on file    Active member of club or organization: Not on file    Attends meetings of clubs or organizations: Not on file    Relationship status: Not on file  . Intimate partner violence:    Fear of current or ex partner: Not on file    Emotionally abused: Not on file    Physically abused: Not on file    Forced sexual activity: Not on file  Other Topics Concern  . Not on file  Social History Narrative  . Not on file     Allergies  Allergen Reactions  . Iodine Itching, Rash and Swelling    IV contrast   . Iodine-131 Itching and Swelling    Pt states causes severe itching Pt states causes severe itching   . Other Shortness Of Breath  . Pineapple Other (See  Comments) and Swelling    Pt states causes her throat swelling Pt states causes her throat swelling   . Pravastatin     Pt states she couldn't lift her legs to walk  . Shellfish Allergy Rash and Swelling    Had reaction to cardiac cath dye  Had seizure ,rash ,itch Oct. 2012 Had reaction to cardiac cath dye  Had seizure ,rash ,itch Oct. 2012  Seizure during Cardiac Cath 2012  . 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 Azithromycin      INEFFECTIVE  . Hydrocodone-Homatropine   . Levofloxacin   . Penicillins   . Shellfish-Derived Products   . Iodinated Diagnostic Agents Rash     Outpatient Medications Prior to Visit  Medication Sig Dispense Refill  . albuterol (PROVENTIL) (2.5 MG/3ML) 0.083% nebulizer solution USE 1 VIAL IN NEBULIZER 4 TIMES DAILY. Generic: VENTOLIN    . Albuterol Sulfate (PROAIR HFA IN) Inhale into the lungs.    [place] Kitchen aspirin 81 MG tablet Take 81 mg by mouth daily.    . budesonide-formoterol (SYMBICORT) 160-4.5 MCG/ACT inhaler Inhale 2 puffs into the lungs 2 (two) times daily. 1 Inhaler 3  . carbamazepine (TEGRETOL) 200 MG tablet Take 200 mg by mouth 2 (two) times daily.    . cefdinir (OMNICEF) 300 MG capsule Take by mouth.    . cetirizine (ZYRTEC) 10 MG tablet Take 10 mg by mouth daily.    . cyclobenzaprine (FLEXERIL) 5 MG tablet Take 5 mg by mouth daily.    [place] Kitchen dicyclomine (BENTYL) 10 MG capsule TAKE 1 CAPSULE (10 MG TOTAL) BY MOUTH 4 TIMES DAILY BEFORE MEALS AND NIGHTLY. AS NEEDED    . diphenhydrAMINE (BENADRYL) 50 MG capsule Take by mouth.    . doxycycline (VIBRAMYCIN) 100 MG capsule Take 100 mg by mouth 2 (two) times daily.  0  . ENSURE PLUS (ENSURE PLUS) LIQD Take 237 mLs by mouth.    . EPINEPHrine (EPIPEN 2-PAK) 0.3 mg/0.3 mL IJ SOAJ injection Inject 0.3 mg into the muscle Once PRN.    [place] Kitchen estradiol (ESTRACE) 1 MG tablet Take 1 mg by mouth daily.  3  . gabapentin (NEURONTIN) 300 MG capsule Take by mouth.    [place] Kitchen ipratropium (ATROVENT) 0.02 %  nebulizer solution Take 2.5 mLs (0.5 mg total) by nebulization every 6 (six) hours as  needed for wheezing or shortness of breath. 75 mL 1  . levalbuterol (XOPENEX HFA) 45 MCG/ACT inhaler Inhale 2 puffs into the lungs every 6 (six) hours as needed for wheezing. 3 Inhaler 0  . levalbuterol (XOPENEX) 1.25 MG/3ML nebulizer solution 1 vial in nebulizer every 6 hours as needed for coughing or wheezing. 90 mL 1  . levETIRAcetam (KEPPRA) 1000 MG tablet Take 1,000 mg by mouth 2 (two) times daily.    [place] Kitchen lisinopril (PRINIVIL,ZESTRIL) 10 MG tablet Take by mouth.    [place] Kitchen LORazepam (ATIVAN PO) Take by mouth 3 (three) times daily.    . meclizine (ANTIVERT) 25 MG tablet Take by mouth.    . montelukast (SINGULAIR) 10 MG tablet TAKE 1 TABLET BY MOUTH EVERYDAY AT BEDTIME 30 tablet 1  . Multiple Vitamin (MULTIVITAMIN) tablet Take by mouth.    [place] Kitchen omeprazole (PRILOSEC) 40 MG capsule 1 capsule 2 times daily.    . ondansetron (ZOFRAN) 4 MG tablet Take 4 mg by mouth.    . predniSONE (DELTASONE) 10 MG tablet     . Tiotropium Bromide Monohydrate (SPIRIVA RESPIMAT) 2.5 MCG/ACT AERS Inhale 1 puff into the lungs daily. 3 Inhaler 3  . umeclidinium bromide (INCRUSE ELLIPTA) 62.5 MCG/INH AEPB Inhale 1 puff into the lungs daily.     Facility-Administered Medications Prior to Visit  Medication Dose Route Frequency Provider Last Rate Last Dose  . omalizumab Geoffry Paradise) injection 150 mg  150 mg Subcutaneous Q28 days Stephannie Li A, MD   150 mg at 10/10/17 0926  . omalizumab Geoffry Paradise) injection 150 mg  150 mg Subcutaneous Q21 days Bobbitt, Heywood Iles, MD   150 mg at 03/03/18 0919      Review of Systems  Constitutional: Negative for fever and unexpected weight change.  HENT: Positive for congestion. Negative for dental problem, ear pain, nosebleeds, postnasal drip, rhinorrhea, sinus pressure, sneezing, sore throat and trouble swallowing.   Eyes: Negative for redness and itching.  Respiratory: Positive for chest tightness, shortness  of breath and wheezing. Negative for cough.   Cardiovascular: Negative for palpitations and leg swelling.  Gastrointestinal: Positive for nausea. Negative for vomiting.  Genitourinary: Negative for dysuria.  Musculoskeletal: Negative for joint swelling.  Skin: Negative for rash.  Allergic/Immunologic: Positive for environmental allergies and food allergies. Negative for immunocompromised state.  Neurological: Negative for headaches.  Hematological: Does not bruise/bleed easily.  Psychiatric/Behavioral: Negative for dysphoric mood. The patient is nervous/anxious.        Objective:   Physical Exam Vitals:   03/06/18 1544  BP: 120/76  Pulse: 96  SpO2: 92%  Weight: 152 lb (68.9 kg)  Height: 5\' 8"  (1.727 m)   RA  O2 saturation: dropped to 87% on RA with walking  Gen: well appearing, no acute distress HENT: NCAT, OP clear, neck supple without masses Eyes: PERRL, EOMi Lymph: no cervical lymphadenopathy PULM: Poor air movement CV: RRR, no mgr, no JVD GI: BS+, soft, nontender, no hsm Derm: no rash or skin breakdown MSK: normal bulk and tone Neuro: A&Ox4, CN II-XII intact, strength 5/5 in all 4 extremities Psyche: normal mood and affect    Chest imaging: February 13, 2018 report of chest x-ray from [facility] says COPD with hyperinflation, no infiltrate July 2019 high-resolution CT scan of the chest showed no evidence of interstitial lung disease, severe centrilobular emphysema with mild diffuse bronchial wall thickening, suggest COPD  PFT [facility] 14, [facility]: Ratio 50%, FEV1 0.59 L 22% predicted, improved 2.75 with Xopenex  Left heart catheterization: Performed January 14, 2018 by Dr. Beverely Pace: Normal LV function, normal coronary arteries, normal thoracic aorta  Records reviewed with [facility] where she was seen for a COPD exacerbation on September 10 and treated with prednisone.     Assessment & Plan:   Asthma with  COPD (HCC)  Bronchiectasis with acute exacerbation (HCC) - Plan: CT Chest High Resolution  Moderate persistent asthma, uncomplicated  Discussion: Orvella presents today for evaluation of severe airflow obstruction in the face of a prior smoking history, severe childhood illness, and recurrent respiratory infections.  She has been taking good medicines for severe COPD for a number of years but remains frustrated over repeated episodes of COPD exacerbations throughout the year.  I question whether or not she could have bronchiectasis considering the fact that she had pneumonia as a child and reports multiple episodes of pneumonia over the years.  She also had childhood polio and was in an iron lung for about 18 months so she is certainly at increased risk for this condition.  Plan: COPD-asthma: For now continue Symbicort and Incruise High-dose flu shot today Practice good hand hygiene Stay active I would like to check a serum alpha-1-antitrypsin, she refused  Recurrent respiratory infections: When I return to our office in [place] on September 27 I will review the images of the CT scan of your chest which was performed in 2019 to see if there was evidence of bronchiectasis  Chronic respiratory failure with hypoxemia: We will check your oxygen level while walking today and at night sleeping on room air  We will see you back in about 2 weeks to go over these results.  > 50% of this 61 minute visit spent face to face    Current Outpatient Medications:  .  albuterol (PROVENTIL) (2.5 MG/3ML) 0.083% nebulizer solution, USE 1 VIAL IN NEBULIZER 4 TIMES DAILY. Generic: VENTOLIN, Disp: , Rfl:  .  Albuterol Sulfate (PROAIR HFA IN), Inhale into the lungs., Disp: , Rfl:  .  aspirin 81 MG tablet, Take 81 mg by mouth daily., Disp: , Rfl:  .  budesonide-formoterol (SYMBICORT) 160-4.5 MCG/ACT inhaler, Inhale 2 puffs into the lungs 2 (two) times daily., Disp: 1 Inhaler, Rfl: 3 .  carbamazepine  (TEGRETOL) 200 MG tablet, Take 200 mg by mouth 2 (two) times daily., Disp: , Rfl:  .  cefdinir (OMNICEF) 300 MG capsule, Take by mouth., Disp: , Rfl:  .  cetirizine (ZYRTEC) 10 MG tablet, Take 10 mg by mouth daily., Disp: , Rfl:  .  cyclobenzaprine (FLEXERIL) 5 MG tablet, Take 5 mg by mouth daily., Disp: , Rfl:  .  dicyclomine (BENTYL) 10 MG capsule, TAKE 1 CAPSULE (10 MG TOTAL) BY MOUTH 4 TIMES DAILY BEFORE MEALS AND NIGHTLY. AS NEEDED, Disp: , Rfl:  .  diphenhydrAMINE (BENADRYL) 50 MG capsule, Take by mouth., Disp: , Rfl:  .  doxycycline (VIBRAMYCIN) 100 MG capsule, Take 100 mg by mouth 2 (two) times daily., Disp: , Rfl: 0 .  ENSURE PLUS (ENSURE PLUS) LIQD, Take 237 mLs by mouth., Disp: , Rfl:  .  EPINEPHrine (EPIPEN 2-PAK) 0.3 mg/0.3 mL IJ SOAJ injection, Inject 0.3 mg into the muscle Once PRN., Disp: , Rfl:  .  estradiol (ESTRACE) 1 MG tablet, Take 1 mg by mouth daily., Disp: , Rfl: 3 .  gabapentin (NEURONTIN) 300 MG capsule, Take by mouth., Disp: , Rfl:  .  ipratropium (ATROVENT) 0.02 % nebulizer solution, Take 2.5 mLs (0.5 mg total) by nebulization every 6 (six)  hours as needed for wheezing or shortness of breath., Disp: 75 mL, Rfl: 1 .  levalbuterol (XOPENEX HFA) 45 MCG/ACT inhaler, Inhale 2 puffs into the lungs every 6 (six) hours as needed for wheezing., Disp: 3 Inhaler, Rfl: 0 .  levalbuterol (XOPENEX) 1.25 MG/3ML nebulizer solution, 1 vial in nebulizer every 6 hours as needed for coughing or wheezing., Disp: 90 mL, Rfl: 1 .  levETIRAcetam (KEPPRA) 1000 MG tablet, Take 1,000 mg by mouth 2 (two) times daily., Disp: , Rfl:  .  lisinopril (PRINIVIL,ZESTRIL) 10 MG tablet, Take by mouth., Disp: , Rfl:  .  LORazepam (ATIVAN PO), Take by mouth 3 (three) times daily., Disp: , Rfl:  .  meclizine (ANTIVERT) 25 MG tablet, Take by mouth., Disp: , Rfl:  .  montelukast (SINGULAIR) 10 MG tablet, TAKE 1 TABLET BY MOUTH EVERYDAY AT BEDTIME, Disp: 30 tablet, Rfl: 1 .  Multiple Vitamin (MULTIVITAMIN)  tablet, Take by mouth., Disp: , Rfl:  .  omeprazole (PRILOSEC) 40 MG capsule, 1 capsule 2 times daily., Disp: , Rfl:  .  ondansetron (ZOFRAN) 4 MG tablet, Take 4 mg by mouth., Disp: , Rfl:  .  predniSONE (DELTASONE) 10 MG tablet, , Disp: , Rfl:  .  Tiotropium Bromide Monohydrate (SPIRIVA RESPIMAT) 2.5 MCG/ACT AERS, Inhale 1 puff into the lungs daily., Disp: 3 Inhaler, Rfl: 3 .  umeclidinium bromide (INCRUSE ELLIPTA) 62.5 MCG/INH AEPB, Inhale 1 puff into the lungs daily., Disp: , Rfl:   Current Facility-Administered Medications:  .  omalizumab Geoffry Paradise) injection 150 mg, 150 mg, Subcutaneous, Q28 days, Bardelas, Jose A, MD, 150 mg at 10/10/17 0926 .  omalizumab Geoffry Paradise) injection 150 mg, 150 mg, Subcutaneous, Q21 days, Bobbitt, Heywood Iles, MD, 150 mg at 03/03/18 [phone]

## 2018-03-07 ENCOUNTER — Institutional Professional Consult (permissible substitution): Payer: Self-pay | Admitting: Pulmonary Disease

## 2018-03-10 ENCOUNTER — Telehealth: Payer: Self-pay | Admitting: Pulmonary Disease

## 2018-03-10 NOTE — Telephone Encounter (Signed)
Called and spoke to patient, patient states she spoke with MD McQuaid and was looking for her CT results. Informed patient I would let BQ know she was looking for the results.   BQ please advise.

## 2018-03-11 NOTE — Telephone Encounter (Signed)
Attempted to call pt. I did not receive an answer. I have left a message for pt to return our call.  

## 2018-03-11 NOTE — Telephone Encounter (Signed)
The CT was performed in July at [facility].  I was able to access it through PACS with [facility] radiology.  She may be able to obtain the report from [facility] radiology or from the physician in [facility] who ordered it.

## 2018-03-12 NOTE — Telephone Encounter (Signed)
Called and spoke to patient, patient states BQ told her he would review her scans and let her know if he thought she was cleared for a procedure to her colon in which she will have to be sedated.   BQ please advise if willing to give surgical clearance or if patient needs a office visit.

## 2018-03-12 NOTE — Telephone Encounter (Signed)
OK to proceed with colonoscopy.

## 2018-03-13 ENCOUNTER — Institutional Professional Consult (permissible substitution): Payer: Self-pay | Admitting: Pulmonary Disease

## 2018-03-13 NOTE — Telephone Encounter (Signed)
Called and spoke with patient.  She said she wants to wait to have her procedure after she talks with Dr. [name] at her appointment on March 25, 2018 at 0915 in [place].  Nothing further needed. Will route to BQ as FYI

## 2018-03-20 ENCOUNTER — Telehealth: Payer: Self-pay | Admitting: Pulmonary Disease

## 2018-03-20 DIAGNOSIS — J449 Chronic obstructive pulmonary disease, unspecified: Secondary | ICD-10-CM

## 2018-03-20 NOTE — Telephone Encounter (Signed)
Spoke with pt. She is requesting a POC to have to use during travel. Pt uses oxygen at night time. Order has been placed.

## 2018-03-21 DIAGNOSIS — J4540 Moderate persistent asthma, uncomplicated: Secondary | ICD-10-CM

## 2018-03-24 ENCOUNTER — Telehealth: Payer: Self-pay | Admitting: Pulmonary Disease

## 2018-03-24 ENCOUNTER — Ambulatory Visit (INDEPENDENT_AMBULATORY_CARE_PROVIDER_SITE_OTHER): Payer: Medicare Other

## 2018-03-24 DIAGNOSIS — J4540 Moderate persistent asthma, uncomplicated: Secondary | ICD-10-CM | POA: Diagnosis not present

## 2018-03-25 ENCOUNTER — Ambulatory Visit: Payer: Medicare Other | Admitting: Pulmonary Disease

## 2018-03-25 NOTE — Telephone Encounter (Signed)
An order was placed on 03/20/18 in regards to pt's O2.   Called and spoke with pt to see if she has heard from [facility] and pt stated she had spoken with someone but stated she needed to speak with a respiratory therapist and that they were supposed to call her, but they never did.  I stated to pt I would call [facility] to have a therapist call her.  Pt expressed understanding.  Called [facility] and spoke with [place] to see if I could speak to a RT in regards to pt awaiting a call from them and has yet to receive one. [place] stated she would see if one was available for me to speak to.  Spoke with RT Sharyl Nimrod stating to her that pt is awaiting a call from RT. Sharyl Nimrod stated she would call pt to see what she was needing. Nothing further needed.

## 2018-03-27 ENCOUNTER — Ambulatory Visit (INDEPENDENT_AMBULATORY_CARE_PROVIDER_SITE_OTHER): Payer: Medicare Other | Admitting: Pulmonary Disease

## 2018-03-27 ENCOUNTER — Encounter: Payer: Self-pay | Admitting: Pulmonary Disease

## 2018-03-27 VITALS — BP 144/83 | HR 86 | Ht 68.0 in | Wt 153.0 lb

## 2018-03-27 DIAGNOSIS — J449 Chronic obstructive pulmonary disease, unspecified: Secondary | ICD-10-CM | POA: Diagnosis not present

## 2018-03-27 DIAGNOSIS — J9611 Chronic respiratory failure with hypoxia: Secondary | ICD-10-CM

## 2018-03-27 MED ORDER — FORMOTEROL FUMARATE 20 MCG/2ML IN NEBU: 20.0000 ug | INHALATION_SOLUTION | Freq: Two times a day (BID) | RESPIRATORY_TRACT | 12 refills | Status: DC

## 2018-03-27 MED ORDER — BUDESONIDE 0.25 MG/2ML IN SUSP: 0.2500 mg | Freq: Two times a day (BID) | RESPIRATORY_TRACT | 11 refills | Status: DC

## 2018-03-27 MED ORDER — REVEFENACIN 175 MCG/3ML IN SOLN: 3.0000 mL | Freq: Every day | RESPIRATORY_TRACT | 12 refills | Status: DC

## 2018-03-27 NOTE — Addendum Note (Signed)
Addended by: Maurene Capes on: 03/27/2018 04:59 PM   Modules accepted: Orders

## 2018-03-27 NOTE — Progress Notes (Signed)
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  Has a diagnosis of severe COPD.  First saw the  pulmonary in September 2019, was previously followed by [facility].   Subjective:    Patient ID: Anne Rich, female    DOB: [birth year], 67 y.o.   MRN: [id number]  HPI Chief Complaint  Patient presents with  . Follow-up    3 wk f/u. Patient is requesting a RX for pulm rehab at [facility] Increased SOB with exertion.    [name] says that her "breathing is more of an issue".  She says that she is over her recent cold.  She feels very short of breath.  She has been taking the Incruise, Symbicort and albuterol 3-4 times a day.  Albuterol helps but if she takes it 4 times a day she feels anxious and jittery.  She doesn't think that her insurance will cover Xopenex.    Past Medical History:  Diagnosis Date  . Asthma      Family History  Problem Relation Age of Onset  . Allergic rhinitis Neg Hx   . Angioedema Neg Hx   . Asthma Neg Hx   . Eczema Neg Hx   . Immunodeficiency Neg Hx   . Urticaria Neg Hx      Review of Systems  Constitutional: Negative for fever and unexpected weight change.  HENT: Positive for congestion. Negative for dental problem, ear pain, nosebleeds, postnasal drip, rhinorrhea, sinus pressure, sneezing, sore throat and trouble swallowing.   Eyes: Negative for redness and itching.  Respiratory: Positive for chest tightness, shortness of breath and wheezing. Negative for cough.   Cardiovascular: Negative for palpitations and leg swelling.  Gastrointestinal: Positive for nausea. Negative for vomiting.  Genitourinary: Negative for dysuria.  Musculoskeletal: Negative for joint swelling.  Skin: Negative for rash.  Allergic/Immunologic: Positive for environmental allergies and food allergies. Negative for immunocompromised state.  Neurological: Negative for headaches.  Hematological: Does not  bruise/bleed easily.  Psychiatric/Behavioral: Negative for dysphoric mood. The patient is nervous/anxious.        Objective:   Physical Exam Vitals:   03/27/18 1409  BP: (!) 144/83  Pulse: 86  SpO2: 95%  Weight: 153 lb (69.4 kg)  Height: 5\' 8"  (1.727 m)   RA  O2 saturation: dropped to 87% on RA with walking  Gen: well appearing HENT: OP clear, TM's clear, neck supple PULM: Diminished airflow B, normal percussion CV: RRR, no mgr, trace edema GI: BS+, soft, nontender Derm: no cyanosis or rash Psyche: normal mood and affect     Chest imaging: February 13, 2018 report of chest x-ray from [facility] says COPD with hyperinflation, no infiltrate July 2019 high-resolution CT scan of the chest showed no evidence of interstitial lung disease, severe centrilobular emphysema with mild diffuse bronchial wall thickening, suggest COPD  PFT February 14, 2018 [facility]: Ratio 50%, FEV1 0.59 L 22% predicted, improved 2.75 with Xopenex  Left heart catheterization: Performed January 14, 2018 by Dr. Beverely Pace: Normal LV function, normal coronary arteries, normal thoracic aorta  Records reviewed with [facility] where she was seen for a COPD exacerbation on September 10 and treated with prednisone.     Assessment & Plan:   Stage 4 very severe COPD by GOLD classification (HCC)  Chronic respiratory failure with hypoxia (HCC)  Discussion: Anne Rich has severe COPD with an FEV1 of only 22% predicted and  a recent CT scan of her chest which showed evidence of severe emphysema.  I explained to her today under no uncertain terms that this is an irreversible condition and is the reason for her dyspnea.  She remains quite active despite this so I have encouraged her to continue staying physically active.  I think she is probably at a point now where medicines administered through traditional inhalers (HFA/dry powder inhalers) may not fully reach her lungs so I  think she should change to a nebulized form of all of them.  I believe she has somewhat unrealistic expectations for what level of activity she can maintain.   Plan: Severe COPD: The CT scan of your chest from this year and your lung function testing showed severe emphysema.  This is irreversible damage to your lung. I think you are at a point where you need to use nebulized medicines rather than inhalers: Stop Symbicort Stop Incruise Start taking you pill re-1 time a day Start taking Perforomist twice a day Start taking budesonide twice a day nebulized Continue using albuterol as needed for chest tightness wheezing or shortness of breath Continue participating in pulmonary rehab  Chronic respiratory failure with hypoxemia: I will write a prescription for you to have 2 L of oxygen with exertion at pulmonary rehab Keep using oxygen at night when you sleep  We will see you back in 6 weeks or sooner if needed     Current Outpatient Medications:  .  albuterol (PROVENTIL) (2.5 MG/3ML) 0.083% nebulizer solution, USE 1 VIAL IN NEBULIZER 4 TIMES DAILY. Generic: VENTOLIN, Disp: , Rfl:  .  Albuterol Sulfate (PROAIR HFA IN), Inhale into the lungs., Disp: , Rfl:  .  aspirin 81 MG tablet, Take 81 mg by mouth daily., Disp: , Rfl:  .  carbamazepine (TEGRETOL) 200 MG tablet, Take 200 mg by mouth 2 (two) times daily., Disp: , Rfl:  .  cefdinir (OMNICEF) 300 MG capsule, Take by mouth., Disp: , Rfl:  .  cetirizine (ZYRTEC) 10 MG tablet, Take 10 mg by mouth daily., Disp: , Rfl:  .  cyclobenzaprine (FLEXERIL) 5 MG tablet, Take 5 mg by mouth daily., Disp: , Rfl:  .  dicyclomine (BENTYL) 10 MG capsule, TAKE 1 CAPSULE (10 MG TOTAL) BY MOUTH 4 TIMES DAILY BEFORE MEALS AND NIGHTLY. AS NEEDED, Disp: , Rfl:  .  diphenhydrAMINE (BENADRYL) 50 MG capsule, Take by mouth., Disp: , Rfl:  .  ENSURE PLUS (ENSURE PLUS) LIQD, Take 237 mLs by mouth., Disp: , Rfl:  .  EPINEPHrine (EPIPEN 2-PAK) 0.3 mg/0.3 mL IJ SOAJ  injection, Inject 0.3 mg into the muscle Once PRN., Disp: , Rfl:  .  estradiol (ESTRACE) 1 MG tablet, Take 1 mg by mouth daily., Disp: , Rfl: 3 .  gabapentin (NEURONTIN) 300 MG capsule, Take by mouth., Disp: , Rfl:  .  ipratropium (ATROVENT) 0.02 % nebulizer solution, Take 2.5 mLs (0.5 mg total) by nebulization every 6 (six) hours as needed for wheezing or shortness of breath., Disp: 75 mL, Rfl: 1 .  levalbuterol (XOPENEX HFA) 45 MCG/ACT inhaler, Inhale 2 puffs into the lungs every 6 (six) hours as needed for wheezing., Disp: 3 Inhaler, Rfl: 0 .  levalbuterol (XOPENEX) 1.25 MG/3ML nebulizer solution, 1 vial in nebulizer every 6 hours as needed for coughing or wheezing., Disp: 90 mL, Rfl: 1 .  levETIRAcetam (KEPPRA) 1000 MG tablet, Take 1,000 mg by mouth 2 (two) times daily., Disp: , Rfl:  .  lisinopril (PRINIVIL,ZESTRIL) 10 MG tablet,  Take by mouth., Disp: , Rfl:  .  LORazepam (ATIVAN PO), Take by mouth 3 (three) times daily., Disp: , Rfl:  .  meclizine (ANTIVERT) 25 MG tablet, Take by mouth., Disp: , Rfl:  .  montelukast (SINGULAIR) 10 MG tablet, TAKE 1 TABLET BY MOUTH EVERYDAY AT BEDTIME, Disp: 30 tablet, Rfl: 1 .  Multiple Vitamin (MULTIVITAMIN) tablet, Take by mouth., Disp: , Rfl:  .  omeprazole (PRILOSEC) 40 MG capsule, 1 capsule 2 times daily., Disp: , Rfl:  .  ondansetron (ZOFRAN) 4 MG tablet, Take 4 mg by mouth., Disp: , Rfl:  .  predniSONE (DELTASONE) 10 MG tablet, , Disp: , Rfl:  .  Tiotropium Bromide Monohydrate (SPIRIVA RESPIMAT) 2.5 MCG/ACT AERS, Inhale 1 puff into the lungs daily., Disp: 3 Inhaler, Rfl: 3  Current Facility-Administered Medications:  .  omalizumab (XOLAIR) injection 150 mg, 150 mg, Subcutaneous, Q28 days, Bardelas, Jose A, MD, 150 mg at 10/10/17 0926 .  omalizumab Geoffry Paradise) injection 150 mg, 150 mg, Subcutaneous, Q21 days, Bobbitt, Heywood Iles, MD, 150 mg at 03/24/18 [phone]

## 2018-03-27 NOTE — Patient Instructions (Signed)
Severe COPD: The CT scan of your chest from this year and your lung function testing showed severe emphysema.  This is irreversible damage to your lung. I think you are at a point where you need to use nebulized medicines rather than inhalers: Stop Symbicort Stop Incruise Start taking you pill re-1 time a day Start taking Perforomist twice a day Start taking budesonide twice a day nebulized Continue using albuterol as needed for chest tightness wheezing or shortness of breath Continue participating in pulmonary rehab  Chronic respiratory failure with hypoxemia: I will write a prescription for you to have 2 L of oxygen with exertion at pulmonary rehab Keep using oxygen at night when you sleep  We will see you back in 6 weeks or sooner if needed

## 2018-03-28 ENCOUNTER — Telehealth: Payer: Self-pay | Admitting: Pulmonary Disease

## 2018-03-28 NOTE — Telephone Encounter (Signed)
Pt is calling back [phone]

## 2018-03-28 NOTE — Telephone Encounter (Signed)
Called and spoke with pt. Pt stated someone had just called her to get the f/u appt scheduled.  Pt is scheduled to see BQ at the [place] office 05/15/18 at 2:15.  Nothing further needed.

## 2018-03-28 NOTE — Addendum Note (Signed)
Addended by: Maurene Capes on: 03/28/2018 09:29 AM   Modules accepted: Orders

## 2018-03-28 NOTE — Telephone Encounter (Signed)
Left message for patient to call back to get scheduled for her 6wk f/u in [place] with BQ.

## 2018-03-28 NOTE — Telephone Encounter (Signed)
Spoke with patient. Advised her that I was working on her medications, she verbalized understanding. She has been scheduled for a f/u with BQ on 12/5 at 215pm.   Nothing further needed at time of call.

## 2018-04-01 ENCOUNTER — Telehealth: Payer: Self-pay | Admitting: Pulmonary Disease

## 2018-04-01 MED ORDER — PREDNISONE 20 MG PO TABS: 20.0000 mg | ORAL_TABLET | Freq: Every day | ORAL | 0 refills | Status: DC

## 2018-04-01 NOTE — Telephone Encounter (Signed)
Called and spoke with pt letting her know that we were going to call in an Rx prednisone 20mg  for her.  Pt expressed understanding. Verified pt's preferred pharmacy and Rx sent in. Nothing further needed.

## 2018-04-01 NOTE — Telephone Encounter (Signed)
Spoke with patient. She stated that since she was last seen by BQ on 10/17, her SOB has increased. She has been able to use the Pulmicort, Perforomist and Yupelri but hasn't noticed much relief. She is requesting prednisone. Denies any fever, chills or cough.   [facility] [place] [place]   BQ, please advise. Thanks!

## 2018-04-01 NOTE — Telephone Encounter (Signed)
Prednisone 20mg daily x 5 days

## 2018-04-03 ENCOUNTER — Telehealth: Payer: Self-pay | Admitting: Pulmonary Disease

## 2018-04-03 NOTE — Telephone Encounter (Signed)
Lmom for [facility] @ [phone]

## 2018-04-04 NOTE — Telephone Encounter (Signed)
An order was placed on 03/20/18 in regards for O2 for pt for her to use 2L with a portable oxygen unit for traveling   At pt's last OV with BQ: Chronic respiratory failure with hypoxemia: I will write a prescription for you to have 2 L of oxygen with exertion at pulmonary rehab  Attempted to call Jasmine but unable to reach her. Left message for Leavy Cella to return call

## 2018-04-04 NOTE — Telephone Encounter (Signed)
Anne Rich is calling back [phone]

## 2018-04-04 NOTE — Telephone Encounter (Signed)
Attempted to call Anne Rich but unable to reach her. Left message for Anne Rich to return call

## 2018-04-07 ENCOUNTER — Telehealth: Payer: Self-pay | Admitting: Pulmonary Disease

## 2018-04-07 MED ORDER — REVEFENACIN 175 MCG/3ML IN SOLN: 3.0000 mL | Freq: Every day | RESPIRATORY_TRACT | 5 refills | Status: DC

## 2018-04-07 NOTE — Telephone Encounter (Signed)
Called and spoke with [facility] who stated pt is about to begin pulm rehab at [facility] and [facility] wanted to know it there was a Rx for pt to have O2.  I stated to her that we did place an order 03/20/18 for pt to begin O2 via portable oxygen unit for traveling. Jasmine was requesting to have the order faxed to them so they would have it for when pt does begin pulm rehab.  I have obtained the fax number that this needs to go to and I have faxed the order in [place]. Nothing further needed.

## 2018-04-07 NOTE — Telephone Encounter (Signed)
Verbal order given from SG. Script printed and faxed to [facility] pharmacy. Nothing further is needed at this time.

## 2018-04-07 NOTE — Telephone Encounter (Signed)
[facility] pharmacy Pphone [phone], Fax [phone], they needed a progress note as well as needing a hand written date on the yupelri prescription. OV printed and refaxed. Called patient to inform her of the above. Voiced understanding. Order pended.   SG please advise if willing to sign order for yupelri as BQ is out of the office. Thanks.

## 2018-04-07 NOTE — Telephone Encounter (Signed)
I will sign the RX. Thanks

## 2018-04-07 NOTE — Telephone Encounter (Signed)
Noted  

## 2018-04-10 ENCOUNTER — Telehealth: Payer: Self-pay | Admitting: Pulmonary Disease

## 2018-04-10 NOTE — Telephone Encounter (Signed)
Spoke with the pharmacist at [facility]. She stated that they had everything ready on their end. She apologized for not contacting the patient. She stated that she would contact the patient now to get medications shipped out today.   Will close this encounter since [facility] will contact the patient.

## 2018-04-11 DIAGNOSIS — J4540 Moderate persistent asthma, uncomplicated: Secondary | ICD-10-CM | POA: Diagnosis not present

## 2018-04-14 ENCOUNTER — Ambulatory Visit (INDEPENDENT_AMBULATORY_CARE_PROVIDER_SITE_OTHER): Payer: Medicare Other

## 2018-04-14 DIAGNOSIS — J4540 Moderate persistent asthma, uncomplicated: Secondary | ICD-10-CM

## 2018-04-29 ENCOUNTER — Encounter: Payer: Self-pay | Admitting: Pulmonary Disease

## 2018-04-29 ENCOUNTER — Ambulatory Visit (INDEPENDENT_AMBULATORY_CARE_PROVIDER_SITE_OTHER): Payer: Medicare Other | Admitting: Pulmonary Disease

## 2018-04-29 ENCOUNTER — Ambulatory Visit: Payer: Medicare Other | Admitting: Pulmonary Disease

## 2018-04-29 VITALS — BP 148/84 | HR 82 | Ht 68.0 in | Wt 153.0 lb

## 2018-04-29 DIAGNOSIS — J449 Chronic obstructive pulmonary disease, unspecified: Secondary | ICD-10-CM

## 2018-04-29 DIAGNOSIS — J9611 Chronic respiratory failure with hypoxia: Secondary | ICD-10-CM

## 2018-04-29 NOTE — Progress Notes (Signed)
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  Has Rich diagnosis of severe COPD.  First saw the [facility] in September 2019, was previously followed by [facility].   Subjective:    Patient ID: Anne Rich, female    DOB: [birth year], 67 y.o.   MRN: [id number]  HPI Chief Complaint  Patient presents with  . Follow-up    4 wk f/u for COPD. She stated that she believes the Perforomist is causing her to have increased SOB and wheezing. Notices the wheezing and SOB about 30 minutes after treatment.    W says that she has been going to pulmonary rehab 3 times Rich week and she wishes that she could go more frequently.  She has not had problems with cough, congestion, or pneumonia since the last visit.  She says that when she uses albuterol it tends to make her anxious.  She does still feel short of breath when she exerts herself.  She has been told she needs to use oxygen when she exercises at pulmonary rehab.  Past Medical History:  Diagnosis Date  . Asthma      Family History  Problem Relation Age of Onset  . Allergic rhinitis Neg Hx   . Angioedema Neg Hx   . Asthma Neg Hx   . Eczema Neg Hx   . Immunodeficiency Neg Hx   . Urticaria Neg Hx      Review of Systems  Constitutional: Negative for fever and unexpected weight change.  HENT: Positive for congestion. Negative for dental problem, ear pain, nosebleeds, postnasal drip, rhinorrhea, sinus pressure, sneezing, sore throat and trouble swallowing.   Eyes: Negative for redness and itching.  Respiratory: Positive for chest tightness, shortness of breath and wheezing. Negative for cough.   Cardiovascular: Negative for palpitations and leg swelling.  Gastrointestinal: Positive for nausea. Negative for vomiting.  Genitourinary: Negative for dysuria.  Musculoskeletal: Negative for joint swelling.  Skin: Negative for rash.  Allergic/Immunologic: Positive for  environmental allergies and food allergies. Negative for immunocompromised state.  Neurological: Negative for headaches.  Hematological: Does not bruise/bleed easily.  Psychiatric/Behavioral: Negative for dysphoric mood. The patient is nervous/anxious.        Objective:   Physical Exam Vitals:   04/29/18 1144  BP: (!) 148/84  Pulse: 82  SpO2: 97%  Weight: 153 lb (69.4 kg)  Height: 5\' 8"  (1.727 m)   RA  O2 saturation: dropped to 87% on RA with walking  Gen: chronically ill appearing HENT: OP clear, TM's clear, neck supple PULM: Poor air movement B, normal percussion CV: RRR, no mgr, trace edema GI: BS+, soft, nontender Derm: no cyanosis or rash Psyche: normal mood and affect      Chest imaging: February 13, 2018 [facility] COPD with hyperinflation, no infiltrate July 2019 high-resolution CT scan of the chest showed no evidence of interstitial lung disease, severe centrilobular emphysema with mild diffuse bronchial wall thickening, suggest COPD  PFT February 14, 2018 [facility]: Ratio 50%, FEV1 0.59 L 22% predicted, improved 2.75 with Xopenex  Left heart catheterization: Performed January 14, 2018 by Dr. Beverely Paceheek: Normal LV function, normal coronary arteries, normal thoracic aorta  Records reviewed with [facility] where she was seen for Rich COPD exacerbation on September 10 and treated with prednisone.     Assessment & Plan:   Chronic respiratory failure  with hypoxia (HCC) - Plan: Ambulatory Referral for DME  Stage 4 very severe COPD by GOLD classification [facility])  Discussion: This has been Rich stable interval for Anne Rich.  She is quite deconditioned and needs to exercise more frequently.  She has severe airflow obstruction due to severe COPD which is the cause of her shortness of breath.  Plan: Chronic respiratory failure with hypoxemia: Use 2 L of oxygen when you exert yourself, we will  prescribe Rich portable oxygen concentrator  Severe COPD Continue Pulmicort twice Rich day Continue Perforomist twice Rich day Continue Yupelri daily  Asthma: Xolair as per the allergist  Shortness of breath/deconditioning: Continue pulmonary rehab, you can walk for exercise at home on days you do not go to pulmonary rehab  Follow-up in 3 months or sooner if needed  18 min spent face to face with patient, 25 minute visit  Current Outpatient Medications:  .  albuterol (PROVENTIL) (2.5 MG/3ML) 0.083% nebulizer solution, USE 1 VIAL IN NEBULIZER 4 TIMES DAILY. Generic: VENTOLIN, Disp: , Rfl:  .  Albuterol Sulfate (PROAIR HFA IN), Inhale into the lungs., Disp: , Rfl:  .  aspirin 81 MG tablet, Take 81 mg by mouth daily., Disp: , Rfl:  .  budesonide (PULMICORT) 0.25 MG/2ML nebulizer solution, Take 2 mLs (0.25 mg total) by nebulization 2 (two) times daily., Disp: 120 mL, Rfl: 11 .  carbamazepine (TEGRETOL) 200 MG tablet, Take 200 mg by mouth 2 (two) times daily., Disp: , Rfl:  .  cefdinir (OMNICEF) 300 MG capsule, Take by mouth., Disp: , Rfl:  .  cetirizine (ZYRTEC) 10 MG tablet, Take 10 mg by mouth daily., Disp: , Rfl:  .  cyclobenzaprine (FLEXERIL) 5 MG tablet, Take 5 mg by mouth daily., Disp: , Rfl:  .  dicyclomine (BENTYL) 10 MG capsule, TAKE 1 CAPSULE (10 MG TOTAL) BY MOUTH 4 TIMES DAILY BEFORE MEALS AND NIGHTLY. AS NEEDED, Disp: , Rfl:  .  diphenhydrAMINE (BENADRYL) 50 MG capsule, Take by mouth., Disp: , Rfl:  .  ENSURE PLUS (ENSURE PLUS) LIQD, Take 237 mLs by mouth., Disp: , Rfl:  .  EPINEPHrine (EPIPEN 2-PAK) 0.3 mg/0.3 mL IJ SOAJ injection, Inject 0.3 mg into the muscle Once PRN., Disp: , Rfl:  .  estradiol (ESTRACE) 1 MG tablet, Take 1 mg by mouth daily., Disp: , Rfl: 3 .  formoterol (PERFOROMIST) 20 MCG/2ML nebulizer solution, Take 2 mLs (20 mcg total) by nebulization 2 (two) times daily., Disp: 120 mL, Rfl: 12 .  gabapentin (NEURONTIN) 300 MG capsule, Take by mouth., Disp: , Rfl:  .   ipratropium (ATROVENT) 0.02 % nebulizer solution, Take 2.5 mLs (0.5 mg total) by nebulization every 6 (six) hours as needed for wheezing or shortness of breath., Disp: 75 mL, Rfl: 1 .  levalbuterol (XOPENEX HFA) 45 MCG/ACT inhaler, Inhale 2 puffs into the lungs every 6 (six) hours as needed for wheezing., Disp: 3 Inhaler, Rfl: 0 .  levalbuterol (XOPENEX) 1.25 MG/3ML nebulizer solution, 1 vial in nebulizer every 6 hours as needed for coughing or wheezing., Disp: 90 mL, Rfl: 1 .  levETIRAcetam (KEPPRA) 1000 MG tablet, Take 1,000 mg by mouth 2 (two) times daily., Disp: , Rfl:  .  lisinopril (PRINIVIL,ZESTRIL) 10 MG tablet, Take by mouth., Disp: , Rfl:  .  LORazepam (ATIVAN PO), Take by mouth 3 (three) times daily., Disp: , Rfl:  .  meclizine (ANTIVERT) 25 MG tablet, Take by mouth., Disp: , Rfl:  .  montelukast (SINGULAIR) 10 MG tablet, TAKE 1 TABLET  BY MOUTH EVERYDAY AT BEDTIME, Disp: 30 tablet, Rfl: 1 .  Multiple Vitamin (MULTIVITAMIN) tablet, Take by mouth., Disp: , Rfl:  .  omeprazole (PRILOSEC) 40 MG capsule, 1 capsule 2 times daily., Disp: , Rfl:  .  ondansetron (ZOFRAN) 4 MG tablet, Take 4 mg by mouth., Disp: , Rfl:  .  predniSONE (DELTASONE) 10 MG tablet, , Disp: , Rfl:  .  predniSONE (DELTASONE) 20 MG tablet, Take 1 tablet (20 mg total) by mouth daily with breakfast., Disp: 5 tablet, Rfl: 0 .  Revefenacin (YUPELRI) 175 MCG/3ML SOLN, Inhale 3 mLs into the lungs daily., Disp: 90 mL, Rfl: 5  Current Facility-Administered Medications:  .  omalizumab (XOLAIR) injection 150 mg, 150 mg, Subcutaneous, Q28 days, Anne Rich, Anne A, MD, 150 mg at 10/10/17 0926 .  omalizumab Anne Rich) injection 150 mg, 150 mg, Subcutaneous, Q21 days, Anne Rich, Anne Iles, MD, 150 mg at 04/14/18 [phone]

## 2018-04-29 NOTE — Patient Instructions (Signed)
Chronic respiratory failure with hypoxemia: Use 2 L of oxygen when you exert yourself, we will prescribe a portable oxygen concentrator  Severe COPD Continue Pulmicort twice a day Continue Perforomist twice a day Continue Yupelri daily  Asthma: Xolair as per the allergist  Shortness of breath/deconditioning: Continue pulmonary rehab, you can walk for exercise at home on days you do not go to pulmonary rehab  Follow-up in 3 months or sooner if needed

## 2018-04-30 ENCOUNTER — Ambulatory Visit: Payer: Self-pay | Admitting: *Deleted

## 2018-04-30 NOTE — Telephone Encounter (Signed)
TC returned to Mrs. Anne Rich. She inadvertently phoned wrong office. She will [facility].

## 2018-05-01 DIAGNOSIS — J9611 Chronic respiratory failure with hypoxia: Secondary | ICD-10-CM | POA: Insufficient documentation

## 2018-05-02 DIAGNOSIS — J4540 Moderate persistent asthma, uncomplicated: Secondary | ICD-10-CM

## 2018-05-05 ENCOUNTER — Telehealth: Payer: Self-pay | Admitting: Pulmonary Disease

## 2018-05-05 ENCOUNTER — Ambulatory Visit (INDEPENDENT_AMBULATORY_CARE_PROVIDER_SITE_OTHER): Payer: Medicare Other | Admitting: *Deleted

## 2018-05-05 DIAGNOSIS — J4540 Moderate persistent asthma, uncomplicated: Secondary | ICD-10-CM | POA: Diagnosis not present

## 2018-05-05 NOTE — Telephone Encounter (Signed)
Called and spoke with patient and advised [facility] at this [facility] as they are backed up and do not have a time frame of when they will be available. Patient stated she wanted to know what she can do about this since she has oxygen with [facility]. She does not want to have to switch DME companies.   [facility] is there a way you could help with this?

## 2018-05-06 MED ORDER — PREDNISONE 20 MG PO TABS: 20.0000 mg | ORAL_TABLET | Freq: Every day | ORAL | 0 refills | Status: DC

## 2018-05-06 NOTE — Telephone Encounter (Addendum)
Patient states she is having cough and congestion in her head and chest. She is coughing up yellow mucus. Denies any fever at this time. She would like something called in she is unable to come in for office visit she does not have a ride.   She also would like to send her POC order to a different DME company. [facility].  Dr.McQuaid please advise.

## 2018-05-06 NOTE — Telephone Encounter (Signed)
Prednisone 20mg  daily x 5 days Call if no improvement OK to change DME

## 2018-05-06 NOTE — Telephone Encounter (Signed)
Called and spoke with pt letting her know that BQ stated for us to send in Rx prednisone for her to take. Stated to her that after the prednisone, if she was no better, we would need her to come in for an OV. Pt expressed understanding.  also stated to pt that BQ said we could change DMEs for her so we could get her a POC. Due to pt only being walked for qualification on continuous O2, stated to pt that we would need her to come in for an OV to qualify her for a POC.  Pt expressed understanding. Scheduled pt an OV next week, Tuesday, 12/3 at 11:30 so she could be qualified for a POC. Also stated in the instructions that pt would need to have DME change. Rx of prednisone sent to pt's preferred pharmacy. Nothing further needed.

## 2018-05-06 NOTE — Telephone Encounter (Signed)
Pt is calling back about her O2  [phone]

## 2018-05-13 ENCOUNTER — Ambulatory Visit (INDEPENDENT_AMBULATORY_CARE_PROVIDER_SITE_OTHER): Payer: Medicare Other | Admitting: *Deleted

## 2018-05-13 DIAGNOSIS — J449 Chronic obstructive pulmonary disease, unspecified: Secondary | ICD-10-CM | POA: Diagnosis not present

## 2018-05-13 NOTE — Progress Notes (Signed)
Qualifying walk completed today. 

## 2018-05-15 ENCOUNTER — Ambulatory Visit: Payer: Medicare Other | Admitting: Pulmonary Disease

## 2018-05-15 ENCOUNTER — Telehealth: Payer: Self-pay | Admitting: Pulmonary Disease

## 2018-05-15 DIAGNOSIS — J449 Chronic obstructive pulmonary disease, unspecified: Secondary | ICD-10-CM

## 2018-05-15 NOTE — Telephone Encounter (Signed)
Order for DME switch placed. Nothing further is needed at this time.

## 2018-05-15 NOTE — Telephone Encounter (Signed)
Called and spoke to patient, patient states she was supposed to be getting a POC. Upon review I do not see a order for a POC. Patient had a qualifying walk 05/13/2018. I informed patient I would place order for DME switch for POC. Voiced understanding.   [facility], patient has a TOC, this is both a mobile and stationary concentrator. Per Insurance does not qualify for both. [facility] or she can have a wall concentrator with a POC.  Called and spoke with patient made aware of all of the above. Voiced understanding.

## 2018-05-15 NOTE — Telephone Encounter (Signed)
See prev msg regarding pt's POC concerns. Barbara CowerJason with [facility] at phone [phone] Ext [phone] states that [facility] and to  to find another DME other company for O2. There will need to be an order placed by provider to pick up O2 once established with another provider.  [facility] could you please help transitioning this patient to another DME. Thank you. Please let triage know if an order or anything is needed to better assist in this transition. Thanks.

## 2018-05-16 NOTE — Telephone Encounter (Signed)
Ok will follow up on Monday to give time to process.

## 2018-05-16 NOTE — Telephone Encounter (Signed)
Sent community message to [employer].

## 2018-05-19 DIAGNOSIS — J4540 Moderate persistent asthma, uncomplicated: Secondary | ICD-10-CM

## 2018-05-19 NOTE — Telephone Encounter (Signed)
Jeanice LimHolly is checking on order she placed on 05/15/18 with [employer] will route to [facility] for further follow up.

## 2018-05-19 NOTE — Telephone Encounter (Signed)
They have received the order.  However, they are requesting the walk test & OV notes to be sent.  I do not see where the walk test was documented [facility] in pt's snapshot.Marland Kitchen..Marland Kitchen

## 2018-05-19 NOTE — Telephone Encounter (Signed)
I faxed OV notes as well as the was test over to [employer].  Rec'd fax confirm.

## 2018-05-20 ENCOUNTER — Ambulatory Visit (INDEPENDENT_AMBULATORY_CARE_PROVIDER_SITE_OTHER): Payer: Medicare Other

## 2018-05-20 DIAGNOSIS — J4540 Moderate persistent asthma, uncomplicated: Secondary | ICD-10-CM | POA: Diagnosis not present

## 2018-05-26 ENCOUNTER — Telehealth: Payer: Self-pay | Admitting: Pulmonary Disease

## 2018-05-26 NOTE — Telephone Encounter (Signed)
Called and spoke with patient, she stated that she was not able to use the oxygen from [employer] the smallest they had was 12 pounds and she cannot carry that. Patient stated she is going to look into [employer] and call us back to let us know if she wants to go with that.

## 2018-06-02 DIAGNOSIS — J4540 Moderate persistent asthma, uncomplicated: Secondary | ICD-10-CM | POA: Diagnosis not present

## 2018-06-03 ENCOUNTER — Other Ambulatory Visit: Payer: Self-pay

## 2018-06-03 ENCOUNTER — Ambulatory Visit (INDEPENDENT_AMBULATORY_CARE_PROVIDER_SITE_OTHER): Payer: Medicare Other

## 2018-06-03 DIAGNOSIS — J449 Chronic obstructive pulmonary disease, unspecified: Secondary | ICD-10-CM

## 2018-06-03 DIAGNOSIS — J4540 Moderate persistent asthma, uncomplicated: Secondary | ICD-10-CM

## 2018-06-03 MED ORDER — MONTELUKAST SODIUM 10 MG PO TABS: ORAL_TABLET | ORAL | 3 refills | Status: DC

## 2018-06-12 ENCOUNTER — Telehealth: Payer: Self-pay | Admitting: Pulmonary Disease

## 2018-06-12 NOTE — Telephone Encounter (Signed)
Spoke with pt. Where she lives a lot of her neighbors have flu/PNA or the norovirus. Pt is wanting to know what precautions she should follow to keep from getting sick. Advised her of proper hand hygiene, avoid people who are sick and if you can't avoid them, wear a mask. Pt agreed and verbalized understanding. Nothing further was needed at this time.

## 2018-06-19 ENCOUNTER — Ambulatory Visit (HOSPITAL_BASED_OUTPATIENT_CLINIC_OR_DEPARTMENT_OTHER)
Admission: RE | Admit: 2018-06-19 | Discharge: 2018-06-19 | Disposition: A | Discharge status: Home / Self Care | Payer: Medicare Other | Source: Ambulatory Visit | Attending: Pulmonary Disease | Admitting: Pulmonary Disease

## 2018-06-19 DIAGNOSIS — J471 Bronchiectasis with (acute) exacerbation: Secondary | ICD-10-CM | POA: Insufficient documentation

## 2018-06-20 ENCOUNTER — Telehealth: Payer: Self-pay | Admitting: Allergy & Immunology

## 2018-06-20 NOTE — Telephone Encounter (Signed)
PT returning call to [facility]. Did not see any nurse call notes, only a Xolair appt for Tue 06/21/2018. PT states the xolair should be given on Monday, not Tuesday as Dr ordered every two weeks. Advise will forward to clinic team.

## 2018-06-20 NOTE — Telephone Encounter (Signed)
LM for call back from pt °

## 2018-06-23 DIAGNOSIS — J4540 Moderate persistent asthma, uncomplicated: Secondary | ICD-10-CM | POA: Diagnosis not present

## 2018-06-23 NOTE — Telephone Encounter (Signed)
Spoke with pt and she was concerned because this was the 3rd week without her shot I told her when she comes in tomorrow to make sure we schedule her every 2 weeks

## 2018-06-24 ENCOUNTER — Ambulatory Visit (INDEPENDENT_AMBULATORY_CARE_PROVIDER_SITE_OTHER): Payer: Medicare Other

## 2018-06-24 ENCOUNTER — Telehealth: Payer: Self-pay | Admitting: Pulmonary Disease

## 2018-06-24 DIAGNOSIS — J4540 Moderate persistent asthma, uncomplicated: Secondary | ICD-10-CM

## 2018-06-24 MED ORDER — PREDNISONE 10 MG PO TABS: 20.0000 mg | ORAL_TABLET | Freq: Every day | ORAL | 0 refills | Status: DC

## 2018-06-24 NOTE — Telephone Encounter (Signed)
**  editing to add that patient has no other current symptoms. She feels as though it is the weather causing breathing problems and the prednisone helps her.

## 2018-06-24 NOTE — Telephone Encounter (Signed)
Yes prednisone 20mg  daily x 5 days

## 2018-06-24 NOTE — Progress Notes (Signed)
VIAL EXP 06-25-2019

## 2018-06-24 NOTE — Telephone Encounter (Signed)
Called and spoke with patient, she stated that she is having increased SOB and she is wanting to know if she can have prednisone prescribed for her since it helps. BQ please advise, thank you.

## 2018-06-24 NOTE — Telephone Encounter (Signed)
Called patient unable to reach. Left detailed message on phone ok to per patient. Nothing further needed.

## 2018-06-25 ENCOUNTER — Telehealth: Payer: Self-pay | Admitting: Pulmonary Disease

## 2018-06-25 NOTE — Telephone Encounter (Signed)
Prednisone doesn't necessarily protect her from chemical damage but it should be a treatment for the COPD exacerbation symptoms she reported yesterday.  I think she should try to find someplace else to sleep tonight based on what she is reporting.

## 2018-06-25 NOTE — Telephone Encounter (Signed)
Patient is returning phone call.  Phone number is [phone].

## 2018-06-25 NOTE — Telephone Encounter (Signed)
Attempted to call pt but unable to reach her. Left message for pt to return call. 

## 2018-06-25 NOTE — Telephone Encounter (Signed)
Called and spoke with patient, advised her of response from BQ. Patient verbalized understanding. Nothing further needed.

## 2018-06-25 NOTE — Telephone Encounter (Signed)
Called and spoke with Patient. She stated that her apartment building cleaned carpet in the apartment beside hers.  She stated that the chemical smell is very bad in her bathroom and in the hallway of her building.  She stated that she contacted her [employer], and she stated that they have  windows, doors, and air condition on in that apartment, but was unsure how long the chemical smell will be there.  Patient stated that she is concerned that the chemical smell will make her lungs worse. Patient was prescribed Prednisone, 06/24/18, and is wanting to know if that will help prevent any lung damage from the fumes.  She denied any breathing distress.  Advised her if she had someone to stay with for a few days until chemical smell is better. Patient says she doesn't want to go stay somewhere else.  Will route to Dr. [name]

## 2018-06-26 DIAGNOSIS — J3089 Other allergic rhinitis: Secondary | ICD-10-CM

## 2018-07-03 ENCOUNTER — Ambulatory Visit (INDEPENDENT_AMBULATORY_CARE_PROVIDER_SITE_OTHER): Payer: Medicare Other | Admitting: Pulmonary Disease

## 2018-07-03 ENCOUNTER — Encounter: Payer: Self-pay | Admitting: Pulmonary Disease

## 2018-07-03 VITALS — BP 138/86 | HR 82 | Ht 68.0 in | Wt 156.0 lb

## 2018-07-03 DIAGNOSIS — J9611 Chronic respiratory failure with hypoxia: Secondary | ICD-10-CM

## 2018-07-03 DIAGNOSIS — J449 Chronic obstructive pulmonary disease, unspecified: Secondary | ICD-10-CM

## 2018-07-03 DIAGNOSIS — R400 Somnolence: Secondary | ICD-10-CM | POA: Diagnosis not present

## 2018-07-03 NOTE — Progress Notes (Signed)
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  Has a diagnosis of severe COPD.  First saw the [facility] in September 2019, was previously followed by [facility] pulmonology.   Subjective:    Patient ID: Anne Rich, female    DOB: [birth year], 68 y.o.   MRN: [id number]  HPI Chief Complaint  Patient presents with  . Follow-up    review CT chest.  pt states she has stable sob.     Anne Rich is here to see me again for her severe COPD.  She has had several episodes of exacerbations where she has had to call in for prednisone.  Most recently she was exposed to some strong chemicals.  She continues to struggle with shortness of breath when she tries to help her neighbors with activities such as doing the laundry or cleaning.  She says she is excessively tired during the daytime.  She frequently wakes up gasping for air in the middle the night.  She wonders if she has sleep apnea.  Past Medical History:  Diagnosis Date  . Asthma      Family History  Problem Relation Age of Onset  . Allergic rhinitis Neg Hx   . Angioedema Neg Hx   . Asthma Neg Hx   . Eczema Neg Hx   . Immunodeficiency Neg Hx   . Urticaria Neg Hx      Review of Systems  Constitutional: Negative for fever and unexpected weight change.  HENT: Positive for congestion. Negative for dental problem, ear pain, nosebleeds, postnasal drip, rhinorrhea, sinus pressure, sneezing, sore throat and trouble swallowing.   Eyes: Negative for redness and itching.  Respiratory: Positive for chest tightness, shortness of breath and wheezing. Negative for cough.   Cardiovascular: Negative for palpitations and leg swelling.  Gastrointestinal: Positive for nausea. Negative for vomiting.  Genitourinary: Negative for dysuria.  Musculoskeletal: Negative for joint swelling.  Skin: Negative for rash.  Allergic/Immunologic: Positive for environmental allergies and food  allergies. Negative for immunocompromised state.  Neurological: Negative for headaches.  Hematological: Does not bruise/bleed easily.  Psychiatric/Behavioral: Negative for dysphoric mood. The patient is nervous/anxious.        Objective:   Physical Exam Vitals:   07/03/18 1454  BP: 138/86  Pulse: 82  SpO2: 98%  Weight: 156 lb (70.8 kg)  Height: 5\' 8"  (1.727 m)   RA  O2 saturation: dropped to 87% on RA with walking  Gen: well appearing HENT: OP clear, TM's clear, neck supple PULM: Poor air movement B, normal percussion CV: RRR, no mgr, trace edema GI: BS+, soft, nontender Derm: no cyanosis or rash Psyche: normal mood and affect  CBC No results found for: WBC, RBC, HGB, HCT, PLT, MCV, MCH, MCHC, RDW, LYMPHSABS, MONOABS, EOSABS, BASOSABS      Chest imaging: February 13, 2018 report of chest x-ray from [facility] says COPD with hyperinflation, no infiltrate July 2019 high-resolution CT scan of the chest showed no evidence of interstitial lung disease, severe centrilobular emphysema with mild diffuse bronchial wall thickening, suggest COPD  PFT February 14, 2018 [facility]: Ratio 50%, FEV1 0.59 L 22% predicted, improved 2.75 with Xopenex  Left heart catheterization: Performed January 14, 2018 by Dr. Beverely Pace: Normal LV function, normal coronary arteries, normal thoracic aorta  Records reviewed with [facility] where she was seen for a COPD exacerbation on September 10 and treated with prednisone.  Assessment & Plan:   Daytime somnolence - Plan: Split night study  Asthma with COPD (HCC)  Chronic respiratory failure with hypoxia (HCC)  Stage 4 very severe COPD by GOLD classification (HCC)  Bronchiectasis with acute exacerbation (HCC)  Discussion: Anne Rich continues to struggle with day-to-day shortness of breath which is due to deconditioning and her very severe airflow obstruction.  I counseled her again today that she  really needs to be cautious with the activity she is committing herself to when she is trying to help her neighbors.  I advised that her lung disease is severe enough to where she really should not over exert with laundry and other activities.  That being said, I have encouraged her to continue exercising as much as possible.  Plan: Chronic respiratory failure with hypoxemia: Keep using 2 L of oxygen when you exert yourself  Severe COPD: Continue Pulmicort twice a day Continue Perforomist twice a day Continue Yupelri daily Use albuterol as needed for shortness of breath  Asthma: Continue Xolair as directed by your allergist  Daytime fatigue, nocturnal trouble sleeping We will check a sleep study because I am worried you may have obstructive sleep apnea  We will see you back in 3 months or sooner if needed  Greater than 50% of this 25-minute visit was spent face-to-face   Current Outpatient Medications:  .  albuterol (PROVENTIL) (2.5 MG/3ML) 0.083% nebulizer solution, USE 1 VIAL IN NEBULIZER 4 TIMES DAILY. Generic: VENTOLIN, Disp: , Rfl:  .  Albuterol Sulfate (PROAIR HFA IN), Inhale into the lungs., Disp: , Rfl:  .  aspirin 81 MG tablet, Take 81 mg by mouth daily., Disp: , Rfl:  .  budesonide (PULMICORT) 0.25 MG/2ML nebulizer solution, Take 2 mLs (0.25 mg total) by nebulization 2 (two) times daily., Disp: 120 mL, Rfl: 11 .  carbamazepine (TEGRETOL) 200 MG tablet, Take 200 mg by mouth 2 (two) times daily., Disp: , Rfl:  .  cefdinir (OMNICEF) 300 MG capsule, Take by mouth., Disp: , Rfl:  .  cetirizine (ZYRTEC) 10 MG tablet, Take 10 mg by mouth daily., Disp: , Rfl:  .  cyclobenzaprine (FLEXERIL) 5 MG tablet, Take 5 mg by mouth daily., Disp: , Rfl:  .  dicyclomine (BENTYL) 10 MG capsule, TAKE 1 CAPSULE (10 MG TOTAL) BY MOUTH 4 TIMES DAILY BEFORE MEALS AND NIGHTLY. AS NEEDED, Disp: , Rfl:  .  diphenhydrAMINE (BENADRYL) 50 MG capsule, Take by mouth., Disp: , Rfl:  .  ENSURE PLUS (ENSURE  PLUS) LIQD, Take 237 mLs by mouth., Disp: , Rfl:  .  EPINEPHrine (EPIPEN 2-PAK) 0.3 mg/0.3 mL IJ SOAJ injection, Inject 0.3 mg into the muscle Once PRN., Disp: , Rfl:  .  estradiol (ESTRACE) 1 MG tablet, Take 1 mg by mouth daily., Disp: , Rfl: 3 .  formoterol (PERFOROMIST) 20 MCG/2ML nebulizer solution, Take 2 mLs (20 mcg total) by nebulization 2 (two) times daily., Disp: 120 mL, Rfl: 12 .  gabapentin (NEURONTIN) 300 MG capsule, Take by mouth., Disp: , Rfl:  .  ipratropium (ATROVENT) 0.02 % nebulizer solution, Take 2.5 mLs (0.5 mg total) by nebulization every 6 (six) hours as needed for wheezing or shortness of breath., Disp: 75 mL, Rfl: 1 .  levalbuterol (XOPENEX HFA) 45 MCG/ACT inhaler, Inhale 2 puffs into the lungs every 6 (six) hours as needed for wheezing., Disp: 3 Inhaler, Rfl: 0 .  levalbuterol (XOPENEX) 1.25 MG/3ML nebulizer solution, 1 vial in nebulizer every 6 hours as needed for coughing or wheezing., Disp: 90  mL, Rfl: 1 .  levETIRAcetam (KEPPRA) 1000 MG tablet, Take 1,000 mg by mouth 2 (two) times daily., Disp: , Rfl:  .  lisinopril (PRINIVIL,ZESTRIL) 10 MG tablet, Take by mouth., Disp: , Rfl:  .  LORazepam (ATIVAN PO), Take by mouth 3 (three) times daily., Disp: , Rfl:  .  meclizine (ANTIVERT) 25 MG tablet, Take by mouth., Disp: , Rfl:  .  montelukast (SINGULAIR) 10 MG tablet, TAKE 1 TABLET BY MOUTH EVERYDAY AT BEDTIME, Disp: 90 tablet, Rfl: 3 .  Multiple Vitamin (MULTIVITAMIN) tablet, Take by mouth., Disp: , Rfl:  .  omeprazole (PRILOSEC) 40 MG capsule, 1 capsule 2 times daily., Disp: , Rfl:  .  ondansetron (ZOFRAN) 4 MG tablet, Take 4 mg by mouth., Disp: , Rfl:  .  predniSONE (DELTASONE) 10 MG tablet, Take 2 tablets (20 mg total) by mouth daily with breakfast., Disp: 10 tablet, Rfl: 0 .  predniSONE (DELTASONE) 20 MG tablet, Take 1 tablet (20 mg total) by mouth daily with breakfast., Disp: 5 tablet, Rfl: 0 .  Revefenacin (YUPELRI) 175 MCG/3ML SOLN, Inhale 3 mLs into the lungs daily.,  Disp: 90 mL, Rfl: 5  Current Facility-Administered Medications:  .  omalizumab (XOLAIR) injection 150 mg, 150 mg, Subcutaneous, Q28 days, Bardelas, Jose A, MD, 150 mg at 10/10/17 0926 .  omalizumab Geoffry Paradise(XOLAIR) injection 150 mg, 150 mg, Subcutaneous, Q21 days, Bobbitt, Heywood Ilesalph Carter, MD, 150 mg at 06/24/18 [phone]

## 2018-07-03 NOTE — Patient Instructions (Signed)
Chronic respiratory failure with hypoxemia: Keep using 2 L of oxygen when you exert yourself  Severe COPD: Continue Pulmicort twice a day Continue Perforomist twice a day Continue Yupelri daily Use albuterol as needed for shortness of breath  Asthma: Continue Xolair as directed by your allergist  Daytime fatigue, nocturnal trouble sleeping We will check a sleep study because I am worried you may have obstructive sleep apnea  We will see you back in 3 months or sooner if needed

## 2018-07-08 ENCOUNTER — Ambulatory Visit: Payer: Self-pay

## 2018-07-08 ENCOUNTER — Ambulatory Visit (INDEPENDENT_AMBULATORY_CARE_PROVIDER_SITE_OTHER): Payer: Medicare Other

## 2018-07-08 DIAGNOSIS — J309 Allergic rhinitis, unspecified: Secondary | ICD-10-CM

## 2018-07-21 ENCOUNTER — Other Ambulatory Visit: Payer: Self-pay | Admitting: Family Medicine

## 2018-07-21 ENCOUNTER — Telehealth: Payer: Self-pay | Admitting: Pulmonary Disease

## 2018-07-21 MED ORDER — DOXYCYCLINE HYCLATE 100 MG PO TABS: 100.0000 mg | ORAL_TABLET | Freq: Two times a day (BID) | ORAL | 0 refills | Status: DC

## 2018-07-21 MED ORDER — PREDNISONE 10 MG PO TABS: ORAL_TABLET | ORAL | 0 refills | Status: DC

## 2018-07-21 NOTE — Telephone Encounter (Signed)
Spoke with pt. She is aware of Beth's recommendations. Both prescriptions were sent in by [facility]. Nothing further was needed.

## 2018-07-21 NOTE — Telephone Encounter (Signed)
Primary Pulmonologist: Dr. [name] Last office visit and with whom: 07/03/2018 with Dr. [name] What do we see them for (pulmonary problems): COPD Last OV assessment/plan:  Chronic respiratory failure with hypoxemia: Keep using 2 L of oxygen when you exert yourself  Severe COPD: Continue Pulmicort twice a day Continue Perforomist twice a day Continue Yupelri daily Use albuterol as needed for shortness of breath  Asthma: Continue Xolair as directed by your allergist  Daytime fatigue, nocturnal trouble sleeping We will check a sleep study because I am worried you may have obstructive sleep apnea  We will see you back in 3 months or sooner if needed  Was appointment offered to patient (explain)?  Yes, declined due to transportation issues   Reason for call:  Spoke with pt. States that she is not feeling well. Reports sinus pressure/congestion and cough. Cough is non productive at this time. Denies chest tightness, wheezing or fever. She has not taken any OTC medications. All of her prescribed medications are being taken as they should be. Pt is requesting to have something sent in. While speaking to the pt is she also requesting that we place a referral for her to do water aerobics.  Beth - please advise. Thanks.

## 2018-07-21 NOTE — Telephone Encounter (Signed)
Advise mucinex twice daily and flonase nasal spray.  Will send in doxycyline and prednisone course. If not better needs visit.

## 2018-07-21 NOTE — Telephone Encounter (Signed)
Copied from CRM [id number]. Topic: Quick Communication - Rx Refill/Question >> Jul 21, 2018  2:05 PM Jilda Roche wrote: Medication: predniSONE (DELTASONE) 10 MG   Has the patient contacted their pharmacy? Yes.   (Agent: If no, request that the patient contact the pharmacy for the refill.) (Agent: If yes, when and what did the pharmacy advise?)Call office  Preferred Pharmacy (with phone number or street name): [id number] [place] [place] - [address], [address] AT [facility] [address] [phone] (Phone) [phone] (Fax)    Agent: Please be advised that RX refills may take up to 3 business days. We ask that you follow-up with your pharmacy.

## 2018-07-22 ENCOUNTER — Ambulatory Visit: Payer: Self-pay

## 2018-07-31 ENCOUNTER — Ambulatory Visit: Payer: Self-pay

## 2018-08-04 DIAGNOSIS — J4540 Moderate persistent asthma, uncomplicated: Secondary | ICD-10-CM

## 2018-08-05 ENCOUNTER — Ambulatory Visit (INDEPENDENT_AMBULATORY_CARE_PROVIDER_SITE_OTHER): Payer: Medicare Other

## 2018-08-05 DIAGNOSIS — J4540 Moderate persistent asthma, uncomplicated: Secondary | ICD-10-CM

## 2018-08-08 ENCOUNTER — Telehealth: Payer: Self-pay | Admitting: Pulmonary Disease

## 2018-08-08 NOTE — Telephone Encounter (Signed)
Patient hand delivered [facility] aquatics paperwork to complete and sign She advised that her insurance company states that if BQ writes a letter stating it is medically necessary that she is needing to exercise daily and do aquatics that it would be free for the patient [employer], she advised that patient would need to go through [employer] program This program provides [employer] and aquatics is free for the patient  Called and spoke with Kathlene November with [employer] at phone [phone] Kathlene November states that patient's plan does not reimburse for [employer] nor [employer] There is no information on pt's plan in need of any medical necessary letter for [employer] and spoke with patient regarding [employer] forms Advised her of above information She advised to throw away form, and she will con't to look into more on her own Pt verbalized understanding, nothing further needed.

## 2018-08-11 ENCOUNTER — Ambulatory Visit (HOSPITAL_BASED_OUTPATIENT_CLINIC_OR_DEPARTMENT_OTHER): Payer: Medicare Other | Attending: Pulmonary Disease | Admitting: Pulmonary Disease

## 2018-08-11 VITALS — Ht 68.0 in | Wt 157.0 lb

## 2018-08-11 DIAGNOSIS — R400 Somnolence: Secondary | ICD-10-CM

## 2018-08-15 DIAGNOSIS — G4736 Sleep related hypoventilation in conditions classified elsewhere: Secondary | ICD-10-CM

## 2018-08-15 NOTE — Procedures (Signed)
Patient Name: Emogean, Boyatt Date: 08/11/2018 Gender: Female D.O.B: March 18, 1951 Age (years): 67 Referring Provider: Max Fickle Height (inches): 64 Interpreting Physician: Cyril Mourning MD, ABSM Weight (lbs): 157 RPSGT: Ulyess Mort BMI: 27 MRN: [id number] Neck Size: 13.50 <br> <br> CLINICAL INFORMATION Sleep Study Type: NPSG    Indication for sleep study: Fatigue, somnolence, COPD, childhood polio, chronic respiratory failure on oxygen    Epworth Sleepiness Score: 4    SLEEP STUDY TECHNIQUE As per the AASM Manual for the Scoring of Sleep and Associated Events v2.3 (April 2016) with a hypopnea requiring 4% desaturations.  The channels recorded and monitored were frontal, central and occipital EEG, electrooculogram (EOG), submentalis EMG (chin), nasal and oral airflow, thoracic and abdominal wall motion, anterior tibialis EMG, snore microphone, electrocardiogram, and pulse oximetry.  MEDICATIONS Medications self-administered by patient taken the night of the study : MELATONIN, TEGRETOL, KEPPRA, PRILOSEC, SINGULAIR, GABAPENTIN, MIRTAZAPINE, FLEXERIL  SLEEP ARCHITECTURE The study was initiated at 10:31:20 PM and ended at 4:51:31 AM.  Sleep onset time was 11.4 minutes and the sleep efficiency was 86.0%%. The total sleep time was 327 minutes.  Stage REM latency was 103.0 minutes.  The patient spent 17.7%% of the night in stage N1 sleep, 60.6%% in stage N2 sleep, 0.0%% in stage N3 and 21.7% in REM.  Alpha intrusion was absent.  Supine sleep was 70.95%.  RESPIRATORY PARAMETERS The overall apnea/hypopnea index (AHI) was 3.7 per hour. There were 19 total apneas, including 19 obstructive, 0 central and 0 mixed apneas. There were 1 hypopneas and 33 RERAs.  The AHI during Stage REM sleep was 13.5 per hour.  AHI while supine was 5.2 per hour.  The mean oxygen saturation was 98.1%. The minimum SpO2 during sleep was 84.0%.  soft snoring was noted during this  study.  CARDIAC DATA The 2 lead EKG demonstrated sinus rhythm. The mean heart rate was 70.7 beats per minute. Other EKG findings include: PVCs.   LEG MOVEMENT DATA The total PLMS were 0 with a resulting PLMS index of 0.0. Associated arousal with leg movement index was 0.2 .  IMPRESSIONS - No significant obstructive sleep apnea occurred during this study (AHI = 3.7/h). Few RERAs noted predominantly in supine REM sleep - No significant central sleep apnea occurred during this study (CAI = 0.0/h). - Mild oxygen desaturation was noted during this study (Min O2 = 84.0%). - The patient snored with soft snoring volume. - EKG findings include PVCs. - Clinically significant periodic limb movements did not occur during sleep. No significant associated arousals.   DIAGNOSIS - Nocturnal Hypoxemia (327.26 [G47.36 ICD-10]) corrected by 1 L oxygen   RECOMMENDATIONS - 1L oxygen during slseep - Avoid alcohol, sedatives and other CNS depressants that may worsen sleep apnea and disrupt normal sleep architecture. - Sleep hygiene should be reviewed to assess factors that may improve sleep quality. - Weight management and regular exercise should be initiated or continued if appropriate.   Cyril Mourning MD Board Certified in Sleep medicine

## 2018-08-18 DIAGNOSIS — J4540 Moderate persistent asthma, uncomplicated: Secondary | ICD-10-CM | POA: Diagnosis not present

## 2018-08-19 ENCOUNTER — Ambulatory Visit (INDEPENDENT_AMBULATORY_CARE_PROVIDER_SITE_OTHER): Payer: Medicare Other

## 2018-08-19 ENCOUNTER — Other Ambulatory Visit: Payer: Self-pay | Admitting: Pulmonary Disease

## 2018-08-19 DIAGNOSIS — J4540 Moderate persistent asthma, uncomplicated: Secondary | ICD-10-CM | POA: Diagnosis not present

## 2018-08-19 DIAGNOSIS — J309 Allergic rhinitis, unspecified: Secondary | ICD-10-CM

## 2018-08-19 MED ORDER — PREDNISONE 20 MG PO TABS: 20.0000 mg | ORAL_TABLET | Freq: Every day | ORAL | 4 refills | Status: AC

## 2018-08-19 NOTE — Progress Notes (Signed)
Requested emergency supply of prednisone, Rx sent per her request to use in event of COPD exacerbation.

## 2018-08-22 ENCOUNTER — Other Ambulatory Visit: Payer: Self-pay | Admitting: *Deleted

## 2018-08-22 DIAGNOSIS — J449 Chronic obstructive pulmonary disease, unspecified: Secondary | ICD-10-CM

## 2018-08-22 MED ORDER — MONTELUKAST SODIUM 10 MG PO TABS: ORAL_TABLET | ORAL | 0 refills | Status: DC

## 2018-08-26 ENCOUNTER — Telehealth: Payer: Self-pay

## 2018-08-26 NOTE — Telephone Encounter (Signed)
Patient called office, she states she is very hoarse and thinks she has laryngitis that is slowly getting worse. Denies fever or cough. She states she does have COPD but her SOB is at her baseline. No recent travel. Denies being around anyone that has been sick. She is using her nebulizer twice daily and it is not helping. Requesting recommendations.   [facility] [place]  Call back [phone]

## 2018-08-26 NOTE — Telephone Encounter (Signed)
Spoke with patient. She verbalized understanding. Nothing further needed at time of call.  

## 2018-08-26 NOTE — Telephone Encounter (Signed)
Recommend salt water gargles .  Make sure taking Zyrtec daily  Would use flonase 2 puffs daily   If not improving will need ov   Please contact office for sooner follow up if symptoms do not improve or worsen or seek emergency care

## 2018-09-02 ENCOUNTER — Ambulatory Visit: Payer: Self-pay

## 2018-09-02 DIAGNOSIS — J4540 Moderate persistent asthma, uncomplicated: Secondary | ICD-10-CM

## 2018-09-03 ENCOUNTER — Telehealth: Payer: Self-pay | Admitting: Allergy and Immunology

## 2018-09-03 ENCOUNTER — Ambulatory Visit (INDEPENDENT_AMBULATORY_CARE_PROVIDER_SITE_OTHER): Payer: Medicare Other

## 2018-09-03 ENCOUNTER — Ambulatory Visit: Payer: Self-pay | Admitting: Allergy and Immunology

## 2018-09-03 ENCOUNTER — Other Ambulatory Visit: Payer: Self-pay

## 2018-09-03 DIAGNOSIS — J4540 Moderate persistent asthma, uncomplicated: Secondary | ICD-10-CM

## 2018-09-03 NOTE — Telephone Encounter (Signed)
PT called to get a refill of cough syrup: promethazine dm syrup. This was ordered by a different provider. PT says this med worked for her last year. She has not been doing allergy shots, so she has post nasal drip. Asking for Dr. Nunzio Cobbs for cough syrup. Please call PT to advise.

## 2018-09-03 NOTE — Telephone Encounter (Signed)
It looks like her last 2 visits have been with Dr. Dellis Anes. Please have her set up a tele-visit with Conley Rolls, or me to address her current issues. Thanks.

## 2018-09-03 NOTE — Telephone Encounter (Signed)
Pt taking zyrtec twice a day and has been taking prednisone 20 mg for the past week and states her oxygen has been upped to 3L. She sleeps with a humidifier. She has had post nasal drip with cough for 3-4 weeks that is waking her up at night. No fever. Her last xolair injection was 2/25 and she stopped her allergy injections 1/28.

## 2018-09-03 NOTE — Telephone Encounter (Signed)
Correction she received xolair injection today. Last allergy injection 1/28.

## 2018-09-04 ENCOUNTER — Encounter: Payer: Self-pay | Admitting: Allergy & Immunology

## 2018-09-04 ENCOUNTER — Ambulatory Visit (INDEPENDENT_AMBULATORY_CARE_PROVIDER_SITE_OTHER): Payer: Medicare Other | Admitting: Allergy & Immunology

## 2018-09-04 ENCOUNTER — Other Ambulatory Visit: Payer: Self-pay

## 2018-09-04 DIAGNOSIS — R059 Cough, unspecified: Secondary | ICD-10-CM

## 2018-09-04 DIAGNOSIS — J449 Chronic obstructive pulmonary disease, unspecified: Secondary | ICD-10-CM

## 2018-09-04 DIAGNOSIS — J3089 Other allergic rhinitis: Secondary | ICD-10-CM

## 2018-09-04 DIAGNOSIS — M9903 Segmental and somatic dysfunction of lumbar region: Secondary | ICD-10-CM | POA: Insufficient documentation

## 2018-09-04 DIAGNOSIS — R05 Cough: Secondary | ICD-10-CM

## 2018-09-04 MED ORDER — LEVALBUTEROL HCL 1.25 MG/3ML IN NEBU: INHALATION_SOLUTION | RESPIRATORY_TRACT | 3 refills | Status: DC

## 2018-09-04 MED ORDER — IPRATROPIUM BROMIDE 0.02 % IN SOLN: RESPIRATORY_TRACT | 3 refills | Status: DC

## 2018-09-04 MED ORDER — PROMETHAZINE-DM 6.25-15 MG/5ML PO SYRP: 2.5000 mL | ORAL_SOLUTION | Freq: Four times a day (QID) | ORAL | 2 refills | Status: DC | PRN

## 2018-09-04 NOTE — Patient Instructions (Addendum)
1. Moderate persistent asthma with COPD overlap - We are going to continue with all of the controller medications, as prescribed by Dr. [name].  - We are going to change the rescue medication to Xopenex/Atrovent as needed.  - Daily controller medication(s): Pulmicort+Performist twice daily via nebulizer + Yupelri once daily via nebulizer - Rescue medications: Xopenex (levalbuterol) + Atrovent (ipratropium) mixed together every 6 hours as needed via nebulizer - Asthma control goals:  * Full participation in all desired activities (may need albuterol before activity) * Albuterol use two time or less a week on average (not counting use with activity) * Cough interfering with sleep two time or less a month * Oral steroids no more than once a year * No hospitalizations  2. Allergic rhinitis - did allergy shots for three years - Continue with montelukast 10mg  daily. - Prescription for promethazine-DM liquid sent in (2.5 mL nightly as needed).  3. Return in about 3 months (around 12/05/2018).   Please inform us of any Emergency Department visits, hospitalizations, or changes in symptoms. Call us before going to the ED for breathing or allergy symptoms since we might be able to fit you in for a sick visit. Feel free to contact us anytime with any questions, problems, or concerns.  It was a pleasure to talk to you today!  today!  Websites that have reliable patient information: 1. American Academy of Asthma, Allergy, and Immunology: www.aaaai.org 2. Food Allergy Research and Education (FARE): foodallergy.org 3. Mothers of Asthmatics: http://www.asthmacommunitynetwork.org 4. American College of Allergy, Asthma, and Immunology: www.acaai.org  "Like" [place] on Facebook and Instagram for our latest updates!      Make sure you are registered to vote! If you have moved or changed any of your contact information, you will need to get this updated before voting!    Voter ID laws are NOT going into  effect for the General Election in November 2020! DO NOT let this stop you from exercising your right to vote!

## 2018-09-04 NOTE — Telephone Encounter (Signed)
Pt has telemed enc set up for today with gallagher

## 2018-09-04 NOTE — Progress Notes (Signed)
FOLLOW UP (TELEVISIT)   Date of Service/Encounter:  09/04/18   Time started: 10:47am Time ended: 11:31am  The patient provided verbal consent for the visit.  The patient was at her home.    Assessment:   Asthma-COPD overlap syndrome  Perennial allergic rhinitis  Cough   Anne Rich presents via a telehealth visit for a sick visit.  She has had a couple of weeks of postnasal drip and an irritating cough, which is worse at night.  It does affect her sleep, but she has noticed that a promethazine dextromethorphan cough syrup seems to help with this.  This is a prescription she originally got in early 2019, but it has taken her over a year to use it up.  She is requesting a refill on this.  I will see any reason not to give her this, as she is clearly not overusing it.  She is also having some problems with her pulmonary status.  She is followed closely by Dr. [name] and since I last saw her all of her medications were changed from inhalers to nebulized forms.  This change seems to have helped, at least initially, but she reports that she is having worsening shortness of breath during her afternoon walks.  Over the course of time since I last saw her in September, her oxygen requirement has increased from 2 L to 3 L.  Therefore, this certainly could be that her natural progression for her disease process.  At this point, we are going to change her to Xopenex and Atrovent to use every 6 hours as needed for shortness of breath.  Not going to change any of her controllers at this point without talking to Dr. [name]. Anne Rich might be a good candidate for Pulmonary Rehab, although with the COVID-19 pandemic, this would likely not be started any time soon.   Finally, we are going to continue with Xolair every two weeks to help control her breathing. Review of labs show that she has had elevated AEC in the 200-300 range, so we could certainly change to a different biologic. However, I will hold off  on this for now.    Plan/Recommendations:   1. Moderate persistent asthma with COPD overlap - We are going to continue with all of the controller medications, as prescribed by Dr. [name].  - We are going to change the rescue medication to Xopenex/Atrovent as needed.  - Daily controller medication(s): Pulmicort+Performist twice daily via nebulizer + Yupelri once daily via nebulizer - Rescue medications: Xopenex (levalbuterol) + Atrovent (ipratropium) mixed together every 6 hours as needed via nebulizer - Asthma control goals:  * Full participation in all desired activities (may need albuterol before activity) * Albuterol use two time or less a week on average (not counting use with activity) * Cough interfering with sleep two time or less a month * Oral steroids no more than once a year * No hospitalizations  2. Allergic rhinitis - did allergy shots for three years - Continue with montelukast 10mg  daily. - Prescription for promethazine-DM liquid sent in (2.5 mL nightly as needed).  3. Return in about 3 months (around 12/05/2018).   Subjective:   Anne Rich is a 68 y.o. female presenting today for follow up of  Chief Complaint  Patient presents with  . Nasal Congestion    TELEHEALTH TELEMED PHONE VISIT.  PATIENT GIVES VERBAL CONSENT TO TREAT AND BILL INSURANCE FOR TELEVISIT -PHONE. alot of PND  . Cough    patient is  using nebulizer. No fever or sore throat.    Anne Rich has a history of the following: Patient Active Problem List   Diagnosis Date Noted  . Lumbosacral dysfunction 09/04/2018  . Chronic respiratory failure with hypoxia (HCC) 05/01/2018  . Statin intolerance 02/13/2018  . Moderate persistent asthma, uncomplicated 01/22/2018  . Adverse effect of other drugs, medicaments and biological substances, initial encounter 01/01/2018  . Restrictive airway disease 12/09/2017  . Bilateral carotid artery stenosis 10/14/2017  . Spells of decreased attentiveness 09/10/2017   . Cortical age-related cataract of left eye 10/24/2016  . Nuclear sclerotic cataract of left eye 10/24/2016  . History of epistaxis 07/19/2016  . Advance directive in chart 04/04/2016  . Idiopathic peripheral neuropathy 03/12/2016  . Loss of appetite 03/12/2016  . Psychophysiological insomnia 03/12/2016  . On supplemental oxygen therapy 02/28/2016  . Easy bruising 12/26/2015  . Gastroesophageal reflux disease 12/02/2015  . Coronary artery calcification seen on CT scan 09/20/2015  . 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HCC) 09/20/2015  . Abnormal weight loss 09/06/2015  . Herniated lumbar intervertebral disc 07/21/2015  . Migraine without aura and without status migrainosus, not intractable 07/21/2015  . Anxiety 06/28/2015  . Chronic constipation 06/28/2015  . Chronic use of benzodiazepine for therapeutic purpose 06/28/2015  . Former smoker 06/28/2015  . Hepatic steatosis 06/28/2015  . History of cerebrovascular accident (CVA) with residual deficit 06/28/2015  . 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 Hypoxemia 06/28/2015  . 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 Osteopenia 06/28/2015  . Unspecified convulsions (HCC) 06/28/2015  . Acute poliomyelitis, unspecified 04/18/2015  . CVA (cerebral vascular accident) (HCC) 04/18/2015  . Essential hypertension 04/18/2015  . Allergic rhinitis 02/10/2015  . Asthma with COPD (HCC) 02/10/2015  . Hip pain 07/08/2013  . Melena 04/22/2013  . Hypercholesterolemia 02/12/2013  . Depressive disorder 11/15/2012  . 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 Low back pain 06/18/2012  . 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 Right foot pain 06/18/2012  . Onychomycosis 03/21/2012  . Pain, chronic 07/13/2011  . Shortness of breath 02/13/2011    History obtained from: chart review and patient via the telephone  Anne Rich is a 68 y.o. female presenting for a sick visit.  I last saw her in September 2019.  At that time, she presented with breathing  difficulties.  We did give her a Xopenex nebulizer treatment and gave her Depo-Medrol.  We added on Spiriva 2.5 mcg once daily.  This was in addition to her Symbicort 160/4.5 mcg 2 puffs twice daily.  We did recommend that she go to see pulmonology for an evaluation.  For her allergic rhinitis, we added on Avelox twice daily for 7 days and nasal saline rinses.  We also recommended continuing with montelukast and adding Mucinex.  In the interim, she called yesterday on March 25 to request a refill of a promethazine-containing cough medicine.  Evidently, this was ordered by different provider. She notices that she has some itching in the back of her throat. She has been using cough drops with some improvement. She takes 1/2 tsp of the promethazine/Mucinex cough medicine. She has been using it at night. The original prescriber of this was a PA with Dr. Ysidro Evert Lysle Pearl). Some nights she does not have these issues. But other nights she does. Her prescription was from over one year ago.   She reports that she has postnasal drip and wheezing. Since I saw her in September 2019, she has established care with Dr. [name]. He made the decision  to change her to all nebulized medications to improve how much of the medication she is receiving. Currently, she is using albuterol, Pulmicort, and Performist mixed twice daily and then she uses Yupelri once daily via a separate nebulizer treatment. This does result in a large percentage of her time being utilized with the nebulizer itself. She reports that she is unable to "think straight" with these nebulized medications. It is nuclear exactly what she means by this. Regardless, she is on prednisone right now and Dr. Kendrick FriesMcQuaid has given her a refill of that; she thinks that this is a five day course. He O2 requirement has increased from 2L to 3L.   She is using her Xopenex HFA as well for episodes of SOB. She receives one of these MDIs monthly, which is all that Medicare  will allow her to receive. She does not have Xopenex/ipratropium for rescue, so this is a treatment option. She reports that she continues to have some problems with wheezing despite these medications. She did have a follow up appointment in April.  After she established care with Dr. Kendrick FriesMcQuaid, he obtained an HRCT of her chest that demonstrated:   IMPRESSION: 1. Tracheobronchomalacia. 2. No evidence of interstitial lung disease. 3. Severe centrilobular emphysema with diffuse bronchial wall thickening, suggesting COPD. 4. One vessel coronary atherosclerosis.  She is able to walk [place] She is able to do that.  She does check on some neighbors as well. She does report that she has some shortness of breath when she is going around on her track.   She has made some changes to help her avoid sick contacts. She has placed a note on her door asking visitors to call her before knocking so that she can screen them for any evidence of infections. It seems that she lives in an area with a high propensity of elderly. She tells me that she is the youngest person in her complex. She had a neighbor who is [phone] who recently passed away.  Otherwise, there have been no changes to her past medical history, surgical history, family history, or social history.    Review of Systems  Constitutional: Negative.  Negative for fever, malaise/fatigue and weight loss.  HENT: Negative.  Negative for congestion, ear discharge, ear pain, sinus pain and sore throat.   Eyes: Negative for pain, discharge and redness.  Respiratory: Positive for shortness of breath and wheezing. Negative for cough and sputum production.   Cardiovascular: Negative.  Negative for chest pain and palpitations.  Gastrointestinal: Negative for abdominal pain and heartburn.  Skin: Negative.  Negative for itching and rash.  Neurological: Negative for dizziness and headaches.  Endo/Heme/Allergies: Negative for environmental  allergies. Does not bruise/bleed easily.       Objective:    Physical Exam: deferred since this was a telehealth visit only       Malachi BondsJoel Binyomin Brann, MD  [facility]

## 2018-09-09 ENCOUNTER — Ambulatory Visit: Payer: Medicare Other | Admitting: Family Medicine

## 2018-09-09 ENCOUNTER — Other Ambulatory Visit: Payer: Self-pay

## 2018-09-09 ENCOUNTER — Ambulatory Visit (INDEPENDENT_AMBULATORY_CARE_PROVIDER_SITE_OTHER): Payer: Medicare Other | Admitting: Family Medicine

## 2018-09-09 ENCOUNTER — Encounter: Payer: Self-pay | Admitting: Family Medicine

## 2018-09-09 DIAGNOSIS — Z9981 Dependence on supplemental oxygen: Secondary | ICD-10-CM

## 2018-09-09 DIAGNOSIS — F419 Anxiety disorder, unspecified: Secondary | ICD-10-CM

## 2018-09-09 DIAGNOSIS — J3089 Other allergic rhinitis: Secondary | ICD-10-CM | POA: Diagnosis not present

## 2018-09-09 DIAGNOSIS — J4541 Moderate persistent asthma with (acute) exacerbation: Secondary | ICD-10-CM | POA: Diagnosis not present

## 2018-09-09 DIAGNOSIS — J449 Chronic obstructive pulmonary disease, unspecified: Secondary | ICD-10-CM

## 2018-09-09 DIAGNOSIS — K219 Gastro-esophageal reflux disease without esophagitis: Secondary | ICD-10-CM

## 2018-09-09 MED ORDER — IPRATROPIUM BROMIDE HFA 17 MCG/ACT IN AERS: INHALATION_SPRAY | RESPIRATORY_TRACT | 1 refills | Status: DC

## 2018-09-09 NOTE — Patient Instructions (Addendum)
Moderate persistent asthma with acute exacerbation/Asthma-COPD overlap syndrome (HCC) Begin prednisone 10 mg tablet. Take 2 tablets once a day for 4 days, then take 1 tablet on the 5th day, then stop We will move forward with changing you to a different biologia medication to control your asthma. In the meantime, continue to receive your Xolair injections every 2 weeks Begin Atrovent HFA 2 puffs twice a day as needed for shortness of breath Continue Yupelri via nebulizer once a day Continue perforomist via nebulizer twice a day Continue Pulmicort 0.25 twice a day Continue montelukast 10 mg once a day Continue Xopenex/Atrovent 2 times a day  Perennial allergic rhinitis Continue cetirizine 10 mg once a day as needed for runny nose  Continue allergen avoidance measures  Anxiety Continue ativan as prescribed  Oxygen dependence Continue 2-3 liters via nasal canula at night and with exercise  Reflux Continue omeprazole as prescribed- 40 mg twice a day Continue lifestyle modifications in your diet as listed below  Call the clinic if this treatment plan is not working well for you  Follow up in 2 weeks or sooner if needed   Lifestyle Changes for Controlling GERD When you have GERD, stomach acid feels as if it's backing up toward your mouth. Whether or not you take medication to control your GERD, your symptoms can often be improved with lifestyle changes.   Raise Your Head  Reflux is more likely to strike when you're lying down flat, because stomach fluid can  flow backward more easily. Raising the head of your bed 4-6 inches can help. To do this:  Slide blocks or books under the legs at the head of your bed. Or, place a wedge under  the mattress. Many foam stores can make a suitable wedge for you. The wedge  should run from your waist to the top of your head.  Don't just prop your head on several pillows. This increases pressure on your  stomach. It can make GERD  worse.  Watch Your Eating Habits Certain foods may increase the acid in your stomach or relax the lower esophageal sphincter, making GERD more likely. It's best to avoid the following:  Coffee, tea, and carbonated drinks (with and without caffeine)  Fatty, fried, or spicy food  Mint, chocolate, onions, and tomatoes  Any other foods that seem to irritate your stomach or cause you pain  Relieve the Pressure  Eat smaller meals, even if you have to eat more often.  Don't lie down right after you eat. Wait a few hours for your stomach to empty.  Avoid tight belts and tight-fitting clothes.  Lose excess weight.  Tobacco and Alcohol  Avoid smoking tobacco and drinking alcohol. They can make GERD symptoms worse.

## 2018-09-09 NOTE — Progress Notes (Signed)
RE: Anne Rich MRN: [id number] DOB: 03-27-1951 Date of Telemedicine Visit: 09/09/2018  Referring provider: Ananias Pilgrim, MD Primary care provider: Ananias Pilgrim, MD  Chief Complaint: Shortness of Breath (fine as long as sitting.  walking causes SOB.  Leaning back in recliner causes weakness and "feels drained") and Asthma (PATIENT GIVES VERBAL CONSENT TO TREAT AND FILE INSURANCE.)   Telemedicine Follow Up Visit via Telephone: I connected with Anne Rich for a follow up on 09/09/18 by telephone and verified that I am speaking with the correct person using two identifiers.   I discussed the limitations, risks, security and privacy concerns of performing an evaluation and management service by telephone and the availability of in person appointments. I also discussed with the patient that there may be a patient responsible charge related to this service. The patient expressed understanding and agreed to proceed.  Patient is at home. Provider is at the office.  Visit start time: 3:33 Visit end time: 4:33 Insurance consent/check in by: Rosalita Levan Medical consent and medical assistant/nurse: [name] Siva  History of Present Illness: She is a 68 y.o. female, who is being followed for asthma/COPD. Her previous allergy office visit was on 09/04/2018 with Dr. Dellis Anes. At today's visit, she reports that she has been experiencing shortness of breath and wheeze that has been worsening over the last month and especially over the last 3-4 days due to pollen and rain. She reports she is more dyspneic with activity and over night. She uses 2-3 liters of oxygen at night and with exercise. She reports that she has an order for prednisone and she takes this as needed for symptoms including shortness of breath and wheeze. She continues Yupelri once a day, perforomist twice a day, Pulmicort twice a day, montelukast 10 mg once a day, Xolair injection every 2 weeks.  and Xopenex/atrovent 1-2 times a day.  She reports the xopenex/atrovent combination is difficult to get due to her insurance limitation. She reports allergic rhinitis is well controlled with no nasal congestion, sneezing, post nasal drip or runny nose. She continues cetirizine once a day. 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 Reflux is reported as well controlled with omeprazole 1-2 times a day. She reports feeling anxious for which she continues ativan. Her current medications are listed in the chart.   Assessment and Plan: Miyanna is a 68 y.o. female with:  Moderate persistent asthma with acute exacerbation/Asthma-COPD overlap syndrome (HCC) Begin prednisone 10 mg tablet. Take 2 tablets once a day for 4 days, then take 1 tablet on the 5th day, then stop We will move forward with changing you to a different biologia medication to control your asthma. In the meantime, continue to receive your Xolair injections every 2 weeks Begin Atrovent HFA 2 puffs twice a day as needed for shortness of breath Continue Yupelri via nebulizer once a day Continue perforomist via nebulizer twice a day Continue Pulmicort 0.25 twice a day Continue montelukast 10 mg once a day Continue Xopenex/Atrovent 2 times a day  Perennial allergic rhinitis Continue cetirizine 10 mg once a day as needed for runny nose  Continue allergen avoidance measures  Anxiety Continue ativan as prescribed  Oxygen dependence Continue 2-3 liters via nasal canula at night and with exercise  Reflux Continue omeprazole as prescribed- 40 mg twice a day Continue lifestyle modifications in your diet as listed below Call the clinic if this treatment plan is not working well for you  Follow  up in 2 weeks or sooner if needed  Return in about 2 weeks (around 09/23/2018), or if symptoms worsen or fail to improve.  Meds ordered this encounter  Medications   ipratropium (ATROVENT HFA) 17 MCG/ACT inhaler    Sig: 2 puffs twice a day as  needed for shortness of breathe.    Dispense:  1 Inhaler    Refill:  1   Lab Orders  No laboratory test(s) ordered today    Diagnostics: None.  Medication List:  Current Outpatient Medications  Medication Sig Dispense Refill   albuterol (PROVENTIL) (2.5 MG/3ML) 0.083% nebulizer solution every 6 (six) hours as needed.      Albuterol Sulfate (PROAIR HFA IN) Inhale into the lungs.     aspirin 81 MG tablet Take 81 mg by mouth daily.     budesonide (PULMICORT) 0.25 MG/2ML nebulizer solution Take 2 mLs (0.25 mg total) by nebulization 2 (two) times daily. 120 mL 11   carbamazepine (TEGRETOL) 200 MG tablet Take 200 mg by mouth 2 (two) times daily.     cetirizine (ZYRTEC) 10 MG tablet Take 10 mg by mouth daily.     cyclobenzaprine (FLEXERIL) 5 MG tablet Take 5 mg by mouth daily.     dicyclomine (BENTYL) 10 MG capsule TAKE 1 CAPSULE (10 MG TOTAL) BY MOUTH 4 TIMES DAILY BEFORE MEALS AND NIGHTLY. AS NEEDED     EPINEPHrine (EPIPEN 2-PAK) 0.3 mg/0.3 mL IJ SOAJ injection Inject 0.3 mg into the muscle Once PRN.     estradiol (ESTRACE) 1 MG tablet Take 1 mg by mouth daily.  3   formoterol (PERFOROMIST) 20 MCG/2ML nebulizer solution Take 2 mLs (20 mcg total) by nebulization 2 (two) times daily. 120 mL 12   gabapentin (NEURONTIN) 300 MG capsule Take 300 mg by mouth.      ipratropium (ATROVENT) 0.02 % nebulizer solution Mix one vial with ipratropium every 6 hours as needed for coughing or wheezing. 75 mL 3   levalbuterol (XOPENEX HFA) 45 MCG/ACT inhaler Inhale 2 puffs into the lungs every 6 (six) hours as needed for wheezing. 3 Inhaler 0   levalbuterol (XOPENEX) 1.25 MG/3ML nebulizer solution Mix one vial with ipratropium every 6 hours as needed for coughing or wheezing. 90 mL 3   levETIRAcetam (KEPPRA) 1000 MG tablet Take 1,000 mg by mouth 2 (two) times daily.     lisinopril (PRINIVIL,ZESTRIL) 10 MG tablet Take by mouth.     LORazepam (ATIVAN PO) Take by mouth 3 (three) times daily.       meclizine (ANTIVERT) 25 MG tablet Take by mouth.     montelukast (SINGULAIR) 10 MG tablet TAKE 1 TABLET BY MOUTH EVERYDAY AT BEDTIME 90 tablet 0   Multiple Vitamin (MULTIVITAMIN) tablet Take by mouth.     omeprazole (PRILOSEC) 40 MG capsule 1 capsule 2 times daily.     ondansetron (ZOFRAN) 4 MG tablet Take 4 mg by mouth.     promethazine-dextromethorphan (PROMETHAZINE-DM) 6.25-15 MG/5ML syrup Take 2.5 mLs by mouth 4 (four) times daily as needed for cough. 118 mL 2   Revefenacin (YUPELRI) 175 MCG/3ML SOLN Inhale 3 mLs into the lungs daily. 90 mL 5   ipratropium (ATROVENT HFA) 17 MCG/ACT inhaler 2 puffs twice a day as needed for shortness of breathe. 1 Inhaler 1   Current Facility-Administered Medications  Medication Dose Route Frequency Provider Last Rate Last Dose   omalizumab Geoffry Paradise) injection 150 mg  150 mg Subcutaneous Q28 days Fletcher Anon, MD   150 mg at  10/10/17 0926   omalizumab Geoffry Paradise) injection 150 mg  150 mg Subcutaneous Q21 days Bobbitt, Heywood Iles, MD   150 mg at 09/03/18 1041   Allergies: Allergies  Allergen Reactions   Iodine Itching, Rash and Swelling    IV contrast    Iodine-131 Itching and Swelling    Pt states causes severe itching Pt states causes severe itching    Other Shortness Of Breath   Pineapple Other (See Comments) and Swelling    Pt states causes her throat swelling Pt states causes her throat swelling    Pravastatin     Pt states she couldn't lift her legs to walk   Shellfish Allergy Rash and Swelling    Had reaction to cardiac cath dye  Had seizure ,rash ,itch Oct. 2012 Had reaction to cardiac cath dye  Had seizure ,rash ,itch Oct. 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  Review of Systems  Constitutional: Negative.   HENT: Negative.   Eyes: Negative.   Respiratory: Positive for apnea, shortness of breath and wheezing.   Cardiovascular: Negative.  Negative for leg swelling.       Patient denies leg swelling or weight gain  Gastrointestinal: Negative.   Musculoskeletal: Negative.   Psychiatric/Behavioral:       Anxious   Objective: Physical Exam Not obtained as encounter was done via telephone.   Previous notes and tests were reviewed.  I discussed the assessment and treatment plan with the patient. The patient was provided an opportunity to ask questions and all were answered. The patient agreed with the plan and demonstrated an understanding of the instructions.   The patient was advised to call back or seek an in-person evaluation if the symptoms worsen or if the condition fails to improve as anticipated.  I provided 60 minutes of non-face-to-face time during this encounter.  It was my pleasure to participate in [facility] care today. Please feel free to contact me with any questions or concerns.   Sincerely,  Thermon Leyland, FNP

## 2018-09-11 ENCOUNTER — Telehealth: Payer: Self-pay | Admitting: Pulmonary Disease

## 2018-09-11 ENCOUNTER — Telehealth: Payer: Self-pay

## 2018-09-11 NOTE — Telephone Encounter (Signed)
Pt stated that she was not much better. She is very wheezy. She is wheezing when she does the performist and Budesonide mixture. She wants to know if this will also cause her to be out of breath also?

## 2018-09-11 NOTE — Telephone Encounter (Signed)
Lm for pt to call us back  

## 2018-09-11 NOTE — Telephone Encounter (Signed)
Can you please have her use these medications separately and see if that helps some? If she has a home pulse ox can you please have her take a measurement? Also please ask her if she is taking prednisone 20 mg once or twice a day. Thank you

## 2018-09-11 NOTE — Telephone Encounter (Signed)
Ambs, Norvel Richards, FNP  [facility]        Can you please call to check on this patient and how she is breathing? Thank you     Lm for pt to call us back

## 2018-09-11 NOTE — Telephone Encounter (Signed)
I am unsure what the patient is asking.  She has been managed on these medications for the last several months.  So this is not a new onset of symptoms.  She should not be combining them.   If she continues to have further questions you can get her set up with a telephone visit with BW as she is seen her in the past.  Elisha Headland FNP

## 2018-09-11 NOTE — Telephone Encounter (Signed)
Called and spoke with pt and stated to her that we should get her scheduled for a televisit with Buelah Manis to further address the wheezing and her neb meds. Pt expressed understanding. televisit scheduled for pt with Buelah Manis, NP tomorrow, 4/3 at 10am. Nothing further needed.

## 2018-09-11 NOTE — Telephone Encounter (Signed)
Spoke with pt, she states she started wheezing and having more SOB after using the budesonide and perforomist. She has been mixing them together so she doesn't know which medication may be causing these symptoms. She denies cold symptoms or fever and uses 3L of oxygen at night  She just started the prednisone 20mg  written for 5 days. She hasn't used her oxygen during the day yet and she is not prescribed daytime oxygen. She doesn't know what to do other than to try the medications separately to see which one is causing the symptoms or she is having an exacerbation. BW gave her pred, doxy on 2/10.  Please advise.     Patient Instructions by Lupita Leash, MD at 07/03/2018 3:15 PM  Author: Lupita Leash, MD Author Type: Physician Filed: 07/03/2018 3:44 PM  Note Status: Signed Cosign: Cosign Not Required Encounter Date: 07/03/2018  Editor: Lupita Leash, MD (Physician)    Chronic respiratory failure with hypoxemia: Keep using 2 L of oxygen when you exert yourself  Severe COPD: Continue Pulmicort twice a day Continue Perforomist twice a day Continue Yupelri daily Use albuterol as needed for shortness of breath  Asthma: Continue Xolair as directed by your allergist  Daytime fatigue, nocturnal trouble sleeping We will check a sleep study because I am worried you may have obstructive sleep apnea  We will see you back in 3 months or sooner if needed     o'

## 2018-09-12 ENCOUNTER — Telehealth: Payer: Self-pay | Admitting: Primary Care

## 2018-09-12 ENCOUNTER — Other Ambulatory Visit: Payer: Self-pay

## 2018-09-12 ENCOUNTER — Encounter: Payer: Self-pay | Admitting: Primary Care

## 2018-09-12 ENCOUNTER — Ambulatory Visit (INDEPENDENT_AMBULATORY_CARE_PROVIDER_SITE_OTHER): Payer: Medicare Other | Admitting: Primary Care

## 2018-09-12 DIAGNOSIS — J441 Chronic obstructive pulmonary disease with (acute) exacerbation: Secondary | ICD-10-CM | POA: Diagnosis not present

## 2018-09-12 MED ORDER — PREDNISONE 10 MG PO TABS: ORAL_TABLET | ORAL | 0 refills | Status: DC

## 2018-09-12 NOTE — Telephone Encounter (Signed)
Tried calling pt again no answer lm  

## 2018-09-12 NOTE — Progress Notes (Signed)
Virtual Visit via Telephone Note  I connected with Anne Rich on 09/12/18 at 10:00 AM EDT by telephone and verified that I am speaking with the correct person using two identifiers.   I discussed the limitations, risks, security and privacy concerns of performing an evaluation and management service by telephone and the availability of in person appointments. I also discussed with the patient that there may be a patient responsible charge related to this service. The patient expressed understanding and agreed to proceed.  Patient at home, agreeing to [facility]. Myself and Debb present on phone call. My nurse lisa to set up follow-up and mail AVS.  History of Present Illness: 68 year old female, former smoker quit 1997 (7.5 pack year hx). PMH significant for asthma-COPD overlap syndrome, chronic respiratory failure with hypoxia, perennial allergic rhinitis. Patient of Dr. [name], lase seen on 07/03/18. Maintained on Pulmicort/Perforomist twice daily and Yuperlri once daily. Albuterol as needed. Wears 2L oxygen on exertion. On Xolair prescribed by her Allergist.   09/12/2018 Called patient today for acute visit. Patient reports heavier wheezing after using perforomist and budesonide nebulizers twice daily. States that it started back in Oct/Nov 2019 when she first went on the nebulizer but only lasted a minute. Over the last month her wheezing has worsened and lasts 10-15 mins after neb treatments. She uses her Xopenex hfa 4 times a day with improvement. She was given 20mg  prednisone taper by FNP. She is very scared that this is worsening of her disease but also thinks it is a reaction to the nebulizer treatments. Continues wearing 3L oxygen. Referred to pulmonary rehab but likely not starting any time soon d/t COVID-19 outbreak.     Observations/Objective:  - Mild dyspnea and wheezing noted during conversation  Assessment and Plan:  Asthma-COPD overlap syndrome - Reports increased wheezing after  Pulmicort/Perforomist lasting 10-5mins. Using Xopenex hfa 4 times a day.  - Discussed above with Dr. [name].  - Plan stop Budesonide and Performist nebulized treatments - Re-start Symbicort 160 two puffs twice a day (patient has some at home) - RX prednisone taper as prescribed  - Continue Yupelri  Respiratory failure with hypoxia - Continue 3L oxygen   Follow Up Instructions:   - Follow up next available apt with Dr. [name]  I discussed the assessment and treatment plan with the patient. The patient was provided an opportunity to ask questions and all were answered. The patient agreed with the plan and demonstrated an understanding of the instructions.   The patient was advised to call back or seek an in-person evaluation if the symptoms worsen or if the condition fails to improve as anticipated.  I provided 35 minutes of non-face-to-face time during this encounter.   Glenford Bayley, NP

## 2018-09-12 NOTE — Patient Instructions (Signed)
Stop budesonide nebulizer  Stop perforomist nebulizer   Start Symbicort two puffs twice daily (in placed of nebs) Continue Yupelri once daily  As needed xopenex rescue inhaler for wheezing/shortness of breath   Follow up next available with Dr. [name]

## 2018-09-16 DIAGNOSIS — J4540 Moderate persistent asthma, uncomplicated: Secondary | ICD-10-CM

## 2018-09-16 NOTE — Progress Notes (Signed)
I discussed her situation with Dr. [name] and he is in agreement with changing her to an anti-IL5 medication. Tammy - which one will be easiest with Medicare?   Malachi Bonds, MD [facility]

## 2018-09-16 NOTE — Telephone Encounter (Signed)
error 

## 2018-09-16 NOTE — Progress Notes (Signed)
She does indeed need a new CBC with diff. I ordered in [facility]. Can someone print off the requisition and send it to her? If she wants, she can come to the [place] office to get this drawn.  Malachi Bonds, MD [facility]

## 2018-09-16 NOTE — Addendum Note (Signed)
Addended by: Alfonse Spruce on: 09/16/2018 10:46 AM   Modules accepted: Orders

## 2018-09-17 ENCOUNTER — Ambulatory Visit (INDEPENDENT_AMBULATORY_CARE_PROVIDER_SITE_OTHER): Payer: Medicare Other | Admitting: *Deleted

## 2018-09-17 ENCOUNTER — Other Ambulatory Visit: Payer: Self-pay

## 2018-09-17 DIAGNOSIS — J4540 Moderate persistent asthma, uncomplicated: Secondary | ICD-10-CM

## 2018-09-18 ENCOUNTER — Telehealth: Payer: Self-pay | Admitting: *Deleted

## 2018-09-18 MED ORDER — IPRATROPIUM BROMIDE 0.02 % IN SOLN: RESPIRATORY_TRACT | 3 refills | Status: DC

## 2018-09-18 MED ORDER — LEVALBUTEROL TARTRATE 45 MCG/ACT IN AERO: 2.0000 | INHALATION_SPRAY | Freq: Four times a day (QID) | RESPIRATORY_TRACT | 1 refills | Status: DC | PRN

## 2018-09-18 NOTE — Telephone Encounter (Signed)
Sent refills of xopenex to [facility]. Sent prescription for atrovent neb solution to [facility] pharmacy on file- Dr. [name] uses that pharmacy for pt to get meds at no cost per pt. Told her that she could use her Atrovent HFA in the meantime as needed.   Pt is going to get lab work Tuesday @10 . Mailed out info on [place].

## 2018-09-18 NOTE — Telephone Encounter (Signed)
Called patient and discussed change to Harrington Challenger from her Xolair therapy.  Advised her that we would need CBC w/diff to get her changed.  SHe expressed concerns over getting stuck and pore veins and I told her that she should mention that when she went to [facility]. I gave her contact info and address to [facility] so she can reach out to them to find out any changes in protocol with walk-ins with COVID19 and best time to come in.  She wanted me to let Dr Dellis Anes know that she is doing better.  I will keep look out for her lab results and possibly get her changed over prior to next injections if they get done.

## 2018-09-19 NOTE — Telephone Encounter (Signed)
Thanks for the note, Anne Rich!   Malachi Bonds, MD [facility]

## 2018-09-24 LAB — CBC WITH DIFFERENTIAL/PLATELET
Basophils Absolute: 0.1 10*3/uL (ref 0.0–0.2)
Basos: 1 %
EOS (ABSOLUTE): 0.2 10*3/uL (ref 0.0–0.4)
Eos: 2 %
Hematocrit: 44.3 % (ref 34.0–46.6)
Hemoglobin: 14.7 g/dL (ref 11.1–15.9)
Immature Grans (Abs): 0 10*3/uL (ref 0.0–0.1)
Immature Granulocytes: 0 %
Lymphocytes Absolute: 2.7 10*3/uL (ref 0.7–3.1)
Lymphs: 28 %
MCH: 30.8 pg (ref 26.6–33.0)
MCHC: 33.2 g/dL (ref 31.5–35.7)
MCV: 93 fL (ref 79–97)
Monocytes Absolute: 0.9 10*3/uL (ref 0.1–0.9)
Monocytes: 9 %
Neutrophils Absolute: 5.9 10*3/uL (ref 1.4–7.0)
Neutrophils: 60 %
Platelets: 380 10*3/uL (ref 150–450)
RBC: 4.77 x10E6/uL (ref 3.77–5.28)
RDW: 12.5 % (ref 11.7–15.4)
WBC: 9.7 10*3/uL (ref 3.4–10.8)

## 2018-09-25 ENCOUNTER — Ambulatory Visit: Payer: 59 | Admitting: Pulmonary Disease

## 2018-09-30 DIAGNOSIS — J4550 Severe persistent asthma, uncomplicated: Secondary | ICD-10-CM | POA: Diagnosis not present

## 2018-10-01 ENCOUNTER — Other Ambulatory Visit: Payer: Self-pay

## 2018-10-01 ENCOUNTER — Ambulatory Visit (INDEPENDENT_AMBULATORY_CARE_PROVIDER_SITE_OTHER): Payer: Medicare Other

## 2018-10-01 DIAGNOSIS — J4550 Severe persistent asthma, uncomplicated: Secondary | ICD-10-CM | POA: Diagnosis not present

## 2018-10-01 DIAGNOSIS — J4540 Moderate persistent asthma, uncomplicated: Secondary | ICD-10-CM

## 2018-10-01 MED ORDER — MEPOLIZUMAB 100 MG ~~LOC~~ SOLR
100.0000 mg | SUBCUTANEOUS | Status: DC
  Administered 2018-10-01 – 2018-12-23 (×4): 100 mg via SUBCUTANEOUS

## 2018-10-01 MED ORDER — EPINEPHRINE 0.3 MG/0.3ML IJ SOAJ: 0.3000 mg | Freq: Once | INTRAMUSCULAR | 1 refills | Status: AC

## 2018-10-01 NOTE — Addendum Note (Signed)
Addended by: Devoria Glassing on: 10/01/2018 10:37 AM   Modules accepted: Orders

## 2018-10-02 ENCOUNTER — Ambulatory Visit: Payer: Medicare Other | Admitting: Pulmonary Disease

## 2018-10-22 ENCOUNTER — Telehealth: Payer: Self-pay | Admitting: *Deleted

## 2018-10-22 NOTE — Telephone Encounter (Signed)
Pt called because she is having SOB, shallow breathing, hoarseness that started yesterday. She did state she has been walking 2 miles everyday but skipped out on it yesterday when she noticed these symptoms. She has started taking Symbicort 160 2 puffs BID per Dr. [name]. She is using cetirizine daily, omeprazole BID, Xopenex BID, Atrovent HFA BID, pulmicort BID, Yupelri daily, she has stopped perforomist. She has only received 1 injection of Nucala and I assured her that it will take several injections to start possibly seeing a difference, she was also concerned about the bruise from the injection that lasted a week and informed her that it was not the medication but most likely placement of injection.. She is wondering if she needs prednisone.

## 2018-10-22 NOTE — Telephone Encounter (Signed)
I called the preferred phone number twice with no answer and left 2 voice mails for this patient to call the office. The other number listed is no longer in service. Can you please call her in the morning? Thank you

## 2018-10-23 NOTE — Telephone Encounter (Signed)
Patient reports her breathing is back to baseline this morning. She completed her 2 mile walk today with no problems. She sill call back if her symptoms return.

## 2018-10-27 ENCOUNTER — Telehealth: Payer: Self-pay | Admitting: Family Medicine

## 2018-10-27 DIAGNOSIS — J4550 Severe persistent asthma, uncomplicated: Secondary | ICD-10-CM

## 2018-10-27 NOTE — Telephone Encounter (Signed)
Patient left a message that she was feeling shortness of breath and was unable to complete her usual two mile walk. She reports using all her medications appropriately and she was wondering if she needed a prednisone taper. Upon the return call, she reported her shortness of breath has fully resolved and she was able to complete her walk that day. She was advised to continue her regular asthma medications as well as Nucala injections. All questions were answered.

## 2018-10-28 ENCOUNTER — Ambulatory Visit (INDEPENDENT_AMBULATORY_CARE_PROVIDER_SITE_OTHER): Payer: Medicare Other

## 2018-10-28 ENCOUNTER — Ambulatory Visit: Payer: Self-pay

## 2018-10-28 ENCOUNTER — Other Ambulatory Visit: Payer: Self-pay

## 2018-10-28 DIAGNOSIS — J4550 Severe persistent asthma, uncomplicated: Secondary | ICD-10-CM | POA: Diagnosis not present

## 2018-11-14 ENCOUNTER — Telehealth: Payer: Self-pay | Admitting: Pulmonary Disease

## 2018-11-14 NOTE — Telephone Encounter (Signed)
Called & spoke w/ pt. Pt states she is currently using O2 3L only for her 2 mile walk in the morning and at night when she lays down to rest. Pt denies taking her O2 levels regularly, but states she usually feels a "tingling" sensation in the back of her head and neck when her levels are low. Pt denies feeling this at the time of the call.   Pt's major concern is derived from her in-lab sleep study, where pt describes the sleep study attendant had to "leave the room and come back" to put O2 on the pt d/t low oxygen saturations. Pt has an MRI coming up 11/21/2018 and needs a letter stating it would be ok for the pt to have a safe canister available for her during the imaging process. Pt states she desaturates the most while lying down.   BPM, please advise on your recommendations. Thank you.

## 2018-11-14 NOTE — Telephone Encounter (Signed)
What has changed for the patient?  January/2020 office note with Dr. Ulyses Jarred says 2 L with exertion. Last follow-up with Waynetta Sandy was a COPD exacerbation and patient was requiring 3 L of O2.  is the patient requiring daily 24/7 oxygen?  If so is she checking her oxygen levels to see what they are during this time?  How long she been needing to do this?  Elisha Headland, FNP

## 2018-11-14 NOTE — Telephone Encounter (Signed)
Spoke to pt and relayed recommendations.  Letter printed for BM to sign and will mail to pt per pt's request.  Nothing further is needed.

## 2018-11-14 NOTE — Telephone Encounter (Signed)
Okay to write letter that patient needs 2 to 3 L continuous during her MRI.  I can sign.  Please emphasize with the patient that if she is using oxygen she needs to be checking her oxygen levels regularly.  Judging her oxygen levels based off the tingling sensations in her head is not as accurate as checking oxygen levels from a pulse oximeter.  If he does not have a pulse oximeter I would recommend that she purchase one from her pharmacy or from [email or web address].  We should be titrating or initiating oxygen based off of pulse oximeter results.Elisha Headland, FNP

## 2018-11-14 NOTE — Telephone Encounter (Signed)
Spoke with pt and she needs letter to state she needs oxygen when she lies down.  She is having an MRI.  Spoke to the imaging clinic and they said they have a safe canister to use during MRI but we need to send a letter.  Anne Rich, can I write a letter stating this?

## 2018-11-17 ENCOUNTER — Other Ambulatory Visit: Payer: Self-pay | Admitting: *Deleted

## 2018-11-17 DIAGNOSIS — J449 Chronic obstructive pulmonary disease, unspecified: Secondary | ICD-10-CM

## 2018-11-17 MED ORDER — LEVALBUTEROL TARTRATE 45 MCG/ACT IN AERO: 2.0000 | INHALATION_SPRAY | Freq: Four times a day (QID) | RESPIRATORY_TRACT | 1 refills | Status: DC | PRN

## 2018-11-17 MED ORDER — MONTELUKAST SODIUM 10 MG PO TABS: ORAL_TABLET | ORAL | 1 refills | Status: DC

## 2018-11-17 MED ORDER — BUDESONIDE-FORMOTEROL FUMARATE 160-4.5 MCG/ACT IN AERO: 2.0000 | INHALATION_SPRAY | Freq: Two times a day (BID) | RESPIRATORY_TRACT | 1 refills | Status: DC

## 2018-11-19 ENCOUNTER — Telehealth: Payer: Self-pay | Admitting: Pulmonary Disease

## 2018-11-19 NOTE — Telephone Encounter (Signed)
Returned call to pt. She states she had contact with someone who later tested positive for COVID. She was wondering what she needed to do. Advised of 14 day self quarantine Advised of symptoms to watch for. Gave pt info on free testing site available in [place]. Pt verbalized understanding. Nothing further needed.

## 2018-11-24 DIAGNOSIS — J4550 Severe persistent asthma, uncomplicated: Secondary | ICD-10-CM | POA: Diagnosis not present

## 2018-11-25 ENCOUNTER — Other Ambulatory Visit: Payer: Self-pay

## 2018-11-25 ENCOUNTER — Ambulatory Visit (INDEPENDENT_AMBULATORY_CARE_PROVIDER_SITE_OTHER): Payer: Medicare Other

## 2018-11-25 ENCOUNTER — Telehealth: Payer: Self-pay | Admitting: Pulmonary Disease

## 2018-11-25 DIAGNOSIS — J4550 Severe persistent asthma, uncomplicated: Secondary | ICD-10-CM | POA: Diagnosis not present

## 2018-11-25 DIAGNOSIS — J4540 Moderate persistent asthma, uncomplicated: Secondary | ICD-10-CM

## 2018-11-25 MED ORDER — YUPELRI 175 MCG/3ML IN SOLN: 3.0000 mL | Freq: Every day | RESPIRATORY_TRACT | 5 refills | Status: DC

## 2018-11-25 MED ORDER — IPRATROPIUM BROMIDE 0.02 % IN SOLN: RESPIRATORY_TRACT | 3 refills | Status: DC

## 2018-11-25 MED ORDER — SPIRIVA RESPIMAT 2.5 MCG/ACT IN AERS: 2.0000 | INHALATION_SPRAY | Freq: Every day | RESPIRATORY_TRACT | 2 refills | Status: DC

## 2018-11-25 NOTE — Telephone Encounter (Signed)
lmtcb x1 pt.  Will send in spiriva resp. 2.5 to see if insurance will cover.

## 2018-11-25 NOTE — Telephone Encounter (Signed)
Refills for yupelri and ipratropium neb sol have been sent to [facility] for pt. Called and spoke with pt letting her know this had been done. Pt stated she had received a call from [place] asking which meds she wanted as insurance may not cover both of them and I stated to her based on last visit with Sharman Crate did state in there for pt to continue on Yupelri. Pt stated she would call the pharmacy and state this to them. Nothing further needed.

## 2018-11-25 NOTE — Telephone Encounter (Signed)
If she is able to use hand held inhaler suggest changing Yupelri to Spiriva respimate 2.80mcg or Spiriva handihaler

## 2018-11-25 NOTE — Telephone Encounter (Signed)
Returned call to [facility], spoke with Kalman Shan, Pharmacists.  Rose stated medicare no longer will pay for Yupelri and Ipratropium. Rose stated [facility] would like to know if provider was wanting to continue with both nebs, or refill one?  Patient last OV was 09/12/18, Eustaquio Maize, NP for COPD Instructions    Return in about 2 months (around 11/12/2018). Stop budesonide nebulizer  Stop perforomist nebulizer   Start Symbicort two puffs twice daily (in placed of nebs) Continue Yupelri once daily  As needed xopenex rescue inhaler for wheezing/shortness of breath   Follow up next available with Dr. Lake Bells     Message routed to Eustaquio Maize, NP to advise on refill for pharmacy

## 2018-11-26 ENCOUNTER — Telehealth: Payer: Self-pay | Admitting: Pulmonary Disease

## 2018-11-26 NOTE — Telephone Encounter (Signed)
  Pt is confused on therapy between allergist and pulmonary. Re-interated plan as stated below. Pt states her insurance will cover Yupelri/Symbicort She was unaware ipratropium was discontinued. There is nothing further needed from [place] at this time.  Called Helene Kelp @ [facility] and notified pt is to continue with Yupelri.    Assessment and Plan:  Asthma-COPD overlap syndrome - Reports increased wheezing after Pulmicort/Perforomist lasting 10-39mins. Using Xopenex hfa 4 times a day.  - Discussed above with Dr. Lake Bells.  - Plan stop Budesonide and Performist nebulized treatments - Re-start Symbicort 160 two puffs twice a day (patient has some at home) - RX prednisone taper as prescribed  - Continue Yupelri  Respiratory failure with hypoxia - Continue 3L oxygen

## 2018-11-26 NOTE — Telephone Encounter (Signed)
Pt aware that she is to continue Yupelri and Symbicort. [facility] pharmacy Helene Kelp says they can fill only 1 nebulized med and it is covered. She has updated patient profile. Nothing further needed.

## 2018-11-26 NOTE — Telephone Encounter (Signed)
I was told yesterday that Anne Rich was not covered so we switched this to Spiriva. If it is covered then continue Symbicort and Yupelri please.

## 2018-11-27 ENCOUNTER — Telehealth: Payer: Self-pay | Admitting: Family Medicine

## 2018-11-27 NOTE — Telephone Encounter (Signed)
Can you please find out Renata's symptoms, if she has a fever, and what she is using for inhalers? Thank you

## 2018-11-27 NOTE — Telephone Encounter (Signed)
Tried calling pt home number not in service. LVM on cell.

## 2018-11-27 NOTE — Telephone Encounter (Signed)
Anne Rich came in the rain for her shots and woke up this morning stuffy nose & sore throat and wants [facility] so she does not go into asthma flare.   ph# [phone] or ph# [phone]

## 2018-12-01 NOTE — Telephone Encounter (Signed)
LVM on cell to return call x2

## 2018-12-01 NOTE — Telephone Encounter (Signed)
Can you please call this patient one more time and ask about her symptoms? Thank you

## 2018-12-02 NOTE — Telephone Encounter (Signed)
Unable to reach pt

## 2018-12-03 NOTE — Telephone Encounter (Signed)
Thank you :)

## 2018-12-04 ENCOUNTER — Telehealth: Payer: Self-pay | Admitting: *Deleted

## 2018-12-04 NOTE — Telephone Encounter (Signed)
She wants to discuss her current biologic therapy so she is making an appointment to come into the office for evaluation and treatment. Thank you

## 2018-12-04 NOTE — Telephone Encounter (Signed)
Anne Rich called she is having SOB this morning and she knows its from the humidity and she walked her 2 miles this morning. She no longer has runny nose and sore throat she has been gargling with salt water everyday. She is using her levalbuterol every 4 hours, Symbicort 2 puffs bid, and yupelri daily. She only has SOB when she is up moving around.

## 2018-12-05 ENCOUNTER — Telehealth: Payer: Self-pay | Admitting: Pulmonary Disease

## 2018-12-05 ENCOUNTER — Ambulatory Visit (INDEPENDENT_AMBULATORY_CARE_PROVIDER_SITE_OTHER): Payer: Medicare Other | Admitting: Allergy

## 2018-12-05 ENCOUNTER — Other Ambulatory Visit: Payer: Self-pay

## 2018-12-05 ENCOUNTER — Encounter: Payer: Self-pay | Admitting: Allergy

## 2018-12-05 VITALS — BP 120/80 | HR 94 | Temp 98.2°F | Resp 20 | Ht 68.0 in | Wt 154.0 lb

## 2018-12-05 DIAGNOSIS — J3089 Other allergic rhinitis: Secondary | ICD-10-CM | POA: Diagnosis not present

## 2018-12-05 DIAGNOSIS — K219 Gastro-esophageal reflux disease without esophagitis: Secondary | ICD-10-CM

## 2018-12-05 DIAGNOSIS — J449 Chronic obstructive pulmonary disease, unspecified: Secondary | ICD-10-CM

## 2018-12-05 DIAGNOSIS — Z9981 Dependence on supplemental oxygen: Secondary | ICD-10-CM | POA: Diagnosis not present

## 2018-12-05 NOTE — Assessment & Plan Note (Signed)
   Continue zyrtec 10mg  daily.

## 2018-12-05 NOTE — Progress Notes (Signed)
Follow Up Note  RE: Anne DeforestDebbie Rich MRN: [id number] DOB: Sep 16, 1950 Date of Office Visit: 12/05/2018  Referring provider: Ananias PilgrimAsres, Alehegn, MD Primary care provider: Ananias PilgrimAsres, Alehegn, MD  Chief Complaint: Shortness of Breath  History of Present Illness: I had the pleasure of seeing Anne Rich for a follow up visit at the [facility] on 12/05/2018. She is a 68 y.o. female, who is being followed for asthma/COPD, allergic rhinitis, GERD. Today she is here for new complaint of shortness of breath. Her previous allergy office visit was on 09/09/2018 with Anne LeylandAnne Ambs, FNP.   Patient [place] and has been having issues catching her breath the last few weeks. She is being followed by pulmonology and has stage 4 COPD. She is using 3L during walks but doesn't think it's enough. Her main complaint is DOE and sometimes noticing that her oxygen level drops to 85% after these walks.   Currently on Yupelri daily, Symbicort 160 2 puffs twice a day and xopenex 2 puffs every 6 hours as needed. She has been using the Xopenex throughout the day but doesn't think it's working as well. Taking Singulair 10mg  daily and Nucala 100mg  every 4 weeks. She think the Xolair was more helpful than the Nucala.   No prednisone use for the past 3 months. Denies any current fevers, chills, coughing or wheezing.   06/19/2018 CT chest: IMPRESSION: 1. Tracheobronchomalacia. 2. No evidence of interstitial lung disease. 3. Severe centrilobular emphysema with diffuse bronchial wall thickening, suggesting COPD. 4. One vessel coronary atherosclerosis.  Assessment and Plan: Anne Rich is a 68 y.o. female with: Asthma-COPD overlap syndrome (HCC) Increasing dyspnea on exertion with O2 sats dipping to 85% despite 3L of oxygen use. Patient is frustrated and not sure what she can do. No recent prednisone use and denies any other symptoms. Issues with insurance and medication coverage.  . Follow up  with your pulmonologist regarding possible prednisone use and getting bloodwork: alpha-1 antitrypsin level. Looks like she refused this in the past.  . Increase oxygen level to 4L while walking and let your pulmonologist know.  . Check oxygen saturation levels when feeling SOB. . Daily controller medication(s):  o Continue Symbicort 160 2 puffs twice a day with spacer and rinse mouth afterwards. o Continue Yupelri daily. o Continue Singulair 10mg  daily. o Continue Nucala injections every 4 weeks for now. (previously on Xolair) . Prior to physical activity: May use albuterol rescue inhaler 3-4 puffs 5 to 15 minutes prior to strenuous physical activities. Marland Kitchen. Rescue medications: May use albuterol rescue inhaler 3-4 puffs or nebulizer every 4 to 6 hours as needed for shortness of breath, chest tightness, coughing, and wheezing. Monitor frequency of use.   Other allergic rhinitis  Continue zyrtec 10mg  daily.  Oxygen dependent  Requiring oxygen due to her COPD.  Gastroesophageal reflux disease  Continue omeprazole 40mg  twice a day.  Return in about 2 months (around 02/04/2019).  Diagnostics: None.  Medication List:  Current Outpatient Medications  Medication Sig Dispense Refill  . Albuterol Sulfate (PROAIR HFA IN) Inhale into the lungs.    Marland Kitchen. aspirin 81 MG tablet Take 81 mg by mouth daily.    . Aspirin Buf,CaCarb-MgCarb-MgO, 81 MG TABS Take by mouth.    . budesonide-formoterol (SYMBICORT) 160-4.5 MCG/ACT inhaler Inhale 2 puffs into the lungs 2 (two) times daily. 3 Inhaler 1  . carbamazepine (TEGRETOL) 200 MG tablet Take 200 mg by mouth 2 (two) times daily.    .Marland Kitchen  cetirizine (ZYRTEC) 10 MG tablet Take 10 mg by mouth daily.    . cyclobenzaprine (FLEXERIL) 5 MG tablet Take 5 mg by mouth daily.    . diclofenac sodium (VOLTAREN) 1 % GEL Apply to affected area four times daily as needed for pain.    Marland Kitchen dicyclomine (BENTYL) 10 MG capsule TAKE 1 CAPSULE (10 MG TOTAL) BY MOUTH 4 TIMES DAILY BEFORE  MEALS AND NIGHTLY. AS NEEDED    . EPINEPHrine (EPIPEN 2-PAK) 0.3 mg/0.3 mL IJ SOAJ injection Inject 0.3 mg into the muscle Once PRN.    Marland Kitchen estradiol (ESTRACE) 1 MG tablet Take 1 mg by mouth daily.  3  . gabapentin (NEURONTIN) 600 MG tablet     . levalbuterol (XOPENEX HFA) 45 MCG/ACT inhaler Inhale 2 puffs into the lungs every 6 (six) hours as needed for wheezing. 2 Inhaler 1  . levETIRAcetam (KEPPRA) 1000 MG tablet Take 1,000 mg by mouth 2 (two) times daily.    Marland Kitchen lisinopril (PRINIVIL,ZESTRIL) 10 MG tablet Take by mouth.    Marland Kitchen LORazepam (ATIVAN PO) Take by mouth 3 (three) times daily.    . meclizine (ANTIVERT) 25 MG tablet Take by mouth.    . Mepolizumab (NUCALA) 100 MG/ML SOAJ Inject into the skin.    . montelukast (SINGULAIR) 10 MG tablet TAKE 1 TABLET BY MOUTH EVERYDAY AT BEDTIME 90 tablet 1  . Multiple Vitamin (MULTIVITAMIN) tablet Take by mouth.    Marland Kitchen omeprazole (PRILOSEC) 40 MG capsule Take 40 mg by mouth 2 (two) times a day.    . ondansetron (ZOFRAN) 4 MG tablet Take 4 mg by mouth.    . OXYGEN Inhale 3 L into the lungs at bedtime.    . promethazine-dextromethorphan (PROMETHAZINE-DM) 6.25-15 MG/5ML syrup Take 2.5 mLs by mouth 4 (four) times daily as needed for cough. 118 mL 2  . Revefenacin (YUPELRI) 175 MCG/3ML SOLN Inhale 3 mLs into the lungs daily. 90 mL 5  . erythromycin ophthalmic ointment Place 1 application into both eyes.     Marland Kitchen gabapentin (NEURONTIN) 300 MG capsule Take 300 mg by mouth.     . levalbuterol (XOPENEX) 1.25 MG/3ML nebulizer solution Mix one vial with ipratropium every 6 hours as needed for coughing or wheezing. (Patient not taking: Reported on 12/05/2018) 90 mL 3  . predniSONE (DELTASONE) 10 MG tablet Take 4 tabs po daily x 2 days; then 3 tabs for 2 days; then 2 tabs for 2 days; then 1 tab for 2 days (Patient not taking: Reported on 12/05/2018) 20 tablet 0   Current Facility-Administered Medications  Medication Dose Route Frequency Provider Last Rate Last Dose  .  Mepolizumab SOLR 100 mg  100 mg Subcutaneous Q28 days Dara Hoyer, FNP   100 mg at 11/25/18 1062   Allergies: Allergies  Allergen Reactions  . Iodine Itching, Rash and Swelling    IV contrast   . Iodine-131 Itching and Swelling    Pt states causes severe itching Pt states causes severe itching   . Other Shortness Of Breath  . Pineapple Other (See Comments) and Swelling    Pt states causes her throat swelling Pt states causes her throat swelling   . Pravastatin     Pt states she couldn't lift her legs to walk  . Shellfish Allergy Rash and Swelling    Had reaction to cardiac cath dye  Had seizure ,rash ,itch Oct. 2012 Had reaction to cardiac cath dye  Had seizure ,rash ,itch Oct. 2012  Seizure during Cardiac Cath 2012  .  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 Azithromycin      INEFFECTIVE  . Hydrocodone-Homatropine   . Levofloxacin   . Penicillins   . Shellfish-Derived Products   . Iodinated Diagnostic Agents Rash   I reviewed her past medical history, social history, family history, and environmental history and no significant changes have been reported from previous visit on 09/09/2018.  Review of Systems  Constitutional: Negative for appetite change, chills, fever and unexpected weight change.  HENT: Negative for congestion and rhinorrhea.   Eyes: Negative for itching.  Respiratory: Positive for chest tightness and shortness of breath. Negative for cough and wheezing.   Gastrointestinal: Negative for abdominal pain.  Skin: Negative for rash.  Neurological: Negative for headaches.   Objective: BP 120/80 (BP Location: Left Arm, Patient Position: Sitting, Cuff Size: Normal)   Pulse 94   Temp 98.2 F (36.8 C) (Oral)   Resp 20   Ht 5\' 8"  (1.727 m)   Wt 154 lb (69.9 kg)   SpO2 95%   BMI 23.42 kg/m  Body mass index is 23.42 kg/m. Physical Exam  Constitutional: She is oriented to person, place, and time. She appears well-developed and  well-nourished.  HENT:  Head: Normocephalic and atraumatic.  Right Ear: External ear normal.  Left Ear: External ear normal.  Nose: Nose normal.  Mouth/Throat: Oropharynx is clear and moist.  Eyes: Conjunctivae and EOM are normal.  Neck: Neck supple.  Cardiovascular: Normal rate, regular rhythm and normal heart sounds. Exam reveals no gallop and no friction rub.  No murmur heard. Pulmonary/Chest: Effort normal and breath sounds normal. She has no wheezes. She has no rales.  Neurological: She is alert and oriented to person, place, and time.  Skin: Skin is warm. No rash noted.  Psychiatric: She has a normal mood and affect. Her behavior is normal.  Nursing note and vitals reviewed.  Previous notes and tests were reviewed. The plan was reviewed with the patient/family, and all questions/concerned were addressed.  It was my pleasure to see Anne Rich today and participate in her care. Please feel free to contact me with any questions or concerns.  Sincerely,  Wyline MoodYoon Briselda Naval, DO Allergy & Immunology  [facility] [place] office: [phone] [facility] office: [phone]  30 minutes spent face-to-face with more than 50% of the time spent discussing COPD/asthma.

## 2018-12-05 NOTE — Patient Instructions (Addendum)
Asthma: . Follow up with your pulmonologist regarding possible prednisone use and getting bloodwork name alpha-1 antitrypsin level . Increase oxygen level to 4L while walking and let your pulmonologist know.  . Daily controller medication(s):  o Continue Symbicort 160 2 puffs twice a day with spacer and rinse mouth afterwards. o Continue Yupelri daily o Continue Singulair 10mg  daily o Continue Nucala injections every 4 weeks . Prior to physical activity: May use albuterol rescue inhaler 3-4 puffs 5 to 15 minutes prior to strenuous physical activities. Marland Kitchen Rescue medications: May use albuterol rescue inhaler 3-4 puffs or nebulizer every 4 to 6 hours as needed for shortness of breath, chest tightness, coughing, and wheezing. Monitor frequency of use.  . Asthma control goals:  o Full participation in all desired activities (may need albuterol before activity) o Albuterol use two times or less a week on average (not counting use with activity) o Cough interfering with sleep two times or less a month o Oral steroids no more than once a year o No hospitalizations  Allergic rhinitis: Continue zyrtec 10mg  daily  Reflux: Continue omeprazole 40mg  twice a day  Follow up in 2 months

## 2018-12-05 NOTE — Assessment & Plan Note (Signed)
Continue omeprazole 40 mg twice a day. ° °

## 2018-12-05 NOTE — Assessment & Plan Note (Signed)
Increasing dyspnea on exertion with O2 sats dipping to 85% despite 3L of oxygen use. Patient is frustrated and not sure what she can do. No recent prednisone use and denies any other symptoms. Issues with insurance and medication coverage.  . Follow up with your pulmonologist regarding possible prednisone use and getting bloodwork: alpha-1 antitrypsin level. Looks like she refused this in the past.  . Increase oxygen level to 4L while walking and let your pulmonologist know.  . Check oxygen saturation levels when feeling SOB. . Daily controller medication(s):  o Continue Symbicort 160 2 puffs twice a day with spacer and rinse mouth afterwards. o Continue Yupelri daily. o Continue Singulair 10mg  daily. o Continue Nucala injections every 4 weeks for now. (previously on Xolair) . Prior to physical activity: May use albuterol rescue inhaler 3-4 puffs 5 to 15 minutes prior to strenuous physical activities. Marland Kitchen Rescue medications: May use albuterol rescue inhaler 3-4 puffs or nebulizer every 4 to 6 hours as needed for shortness of breath, chest tightness, coughing, and wheezing. Monitor frequency of use.

## 2018-12-05 NOTE — Telephone Encounter (Signed)
Reviewed the OV that pt had with her allergist Dr. Maudie Mercury.  Read in here where pt's complaint was her O2 would drop to 85% on 3L O2 with exertion and saw where pt had been using rescue inhaler 2puffs every 6 hours as needed.  After the OV, the plan was to increase pt's O2 level to 4L while walking and she could use her rescue inhaler every 4 to 6 hours doing 3 to 4 puffs.     Attempted to call pt to further discuss this and to also see if we could get her scheduled for a follow up office visit with APP but unable to reach pt. Left message for pt to return call.

## 2018-12-05 NOTE — Assessment & Plan Note (Signed)
   Requiring oxygen due to her COPD.

## 2018-12-08 NOTE — Telephone Encounter (Signed)
Called and spoke with pt in regards to the oxygen being bumped up to 4L and for pt to do 3 puffs of xopenex per allergist. Asked pt if she could tell a difference since the change and pt stated she has been able to tell a difference.  Pt stated on the 4L of O2 and doing 3 puffs of the xopenex, her breathing has been much better since the change. Asked pt if she wanted to schedule follow up with APP and she said she wanted to wait at this time to see if BQ would open up his schedule. Nothing further needed.

## 2018-12-22 DIAGNOSIS — J4550 Severe persistent asthma, uncomplicated: Secondary | ICD-10-CM | POA: Diagnosis not present

## 2018-12-23 ENCOUNTER — Ambulatory Visit (INDEPENDENT_AMBULATORY_CARE_PROVIDER_SITE_OTHER): Payer: Medicare Other

## 2018-12-23 ENCOUNTER — Other Ambulatory Visit: Payer: Self-pay

## 2018-12-23 DIAGNOSIS — J4550 Severe persistent asthma, uncomplicated: Secondary | ICD-10-CM

## 2018-12-23 DIAGNOSIS — J4540 Moderate persistent asthma, uncomplicated: Secondary | ICD-10-CM

## 2018-12-25 ENCOUNTER — Other Ambulatory Visit: Payer: Self-pay

## 2018-12-25 ENCOUNTER — Encounter: Payer: Self-pay | Admitting: Allergy & Immunology

## 2018-12-25 ENCOUNTER — Ambulatory Visit (INDEPENDENT_AMBULATORY_CARE_PROVIDER_SITE_OTHER): Payer: Medicare Other | Admitting: Allergy & Immunology

## 2018-12-25 ENCOUNTER — Ambulatory Visit: Payer: Medicare Other | Admitting: Allergy & Immunology

## 2018-12-25 DIAGNOSIS — J3089 Other allergic rhinitis: Secondary | ICD-10-CM

## 2018-12-25 DIAGNOSIS — J449 Chronic obstructive pulmonary disease, unspecified: Secondary | ICD-10-CM | POA: Diagnosis not present

## 2018-12-25 NOTE — Patient Instructions (Addendum)
1. Moderate persistent asthma with COPD overlap - We are going to transition back to the Xolair since you feel that this was working better.  - Tammy will reach out to discuss this further, although she might have everything that you need.  - Continue with your O2 at 2L up to 4L.   - Daily controller medication(s): Symbicort 160/4.68mcg two puffs twice daily + Yupelri once daily via nebulizer - Rescue medications: Xopenex (levalbuterol) + Atrovent (ipratropium) mixed together every 6 hours as needed via nebulizer - Asthma control goals:  * Full participation in all desired activities (may need albuterol before activity) * Albuterol use two time or less a week on average (not counting use with activity) * Cough interfering with sleep two time or less a month * Oral steroids no more than once a year * No hospitalizations  2. Allergic rhinitis - did allergy shots for three years - Continue with montelukast 10mg  daily.  3. Return in about 3 months (around 03/16/2019).   Please inform us of any Emergency Department visits, hospitalizations, or changes in symptoms. Call us before going to the ED for breathing or allergy symptoms since we might be able to fit you in for a sick visit. Feel free to contact us anytime with any questions, problems, or concerns.  It was a pleasure to talk to you today!  today!  Websites that have reliable patient information: 1. American Academy of Asthma, Allergy, and Immunology: www.aaaai.org 2. Food Allergy Research and Education (FARE): foodallergy.org 3. Mothers of Asthmatics: http://www.asthmacommunitynetwork.org 4. American College of Allergy, Asthma, and Immunology: www.acaai.org  "Like" [place] on Facebook and Instagram for our latest updates!      Make sure you are registered to vote! If you have moved or changed any of your contact information, you will need to get this updated before voting!    Voter ID laws are NOT going into effect for the General  Election in November 2020! DO NOT let this stop you from exercising your right to vote!

## 2018-12-25 NOTE — Progress Notes (Signed)
RE: Anne Rich Barradas MRN: [id number] DOB: November 21, 1950 Date of Telemedicine Visit: 12/25/2018  Referring provider: Ananias PilgrimAsres, Alehegn, MD Primary care provider: Ananias PilgrimAsres, Alehegn, MD  Chief Complaint: Asthma (feels like Nucala is not working, depending on rescue inhaler more, spiriva affected her kidneys stopped taking) and Allergic Rhinitis    Telemedicine Follow Up Visit via Telephone: I connected with Anne Rich Maland for a follow up on 12/25/18 by telephone and verified that I am speaking with the correct person using two identifiers.   I discussed the limitations, risks, security and privacy concerns of performing an evaluation and management service by telephone and the availability of in person appointments. I also discussed with the patient that there may be a patient responsible charge related to this service. The patient expressed understanding and agreed to proceed.  Patient is at home.  Provider is at the office.  Visit start time: 2:06 PM Visit end time: 2:36 PM [facility] and medical assistant/nurse: Darreld Mcleanrina  History of Present Illness:  Anne Rich is a 68 y.o. female presenting for a follow up visit.  She was last seen by Dr. Selena BattenKim on June 26.  At that time, she had a new complaint of shortness of breath.  She does have a history of stage IV COPD and uses 3 L of O2 during the day.  Her oxygen level was dropping to 85% after the walks.  At the time, she was on Yupelri daily, Symbicort 160/4.5 mcg 2 puffs twice daily, and Xopenex 2 puffs every 6 hours as needed.  She has been on both Xolair and mepolizumab.  At the last visit, it was recommended again that she get alpha 1 antitrypsin.  It was recommended that she increase her oxygen to 4 L while walking (and letting her pulmonologist know).  She was continued on the same medications.  For her allergic rhinitis she was continued on Zyrtec 10 mg daily.  She was also continued on omeprazole 40 mg twice a day for her  reflux.  She is followed closely by Dr. Kendrick FriesMcQuaid.  He had changed her at one point all nebulized medications.  Over the last several months, her oxygen requirement has increased from 2 L up to now 4 L.   Since the last visit, she has not done well. She is not happy [facility] [place] [facility] and she spends some time telling me that.   Asthma/Respiratory Symptom History: She stopped her Spiriva because it was affecting her kindneys evidently. She is on the [place] still and the Symbicort. She has not been able to see Dr. Kendrick FriesMcQuaid because he has been busy with COVID19. She typically is around 3L O2. She went yesterday to take a 95yo friend to the grocery store. She was having problems with physical activity. She did not have her pulse ox with her. Her normal O2 range is around 88-95%. Her lowest has been when she was around 85%. She has having chills at that time. She is wondering about whether she needs to see Hospice. She was told by Dr. Kendrick FriesMcQuaid that she was in "denial" about how severe.   Apparently insurance was only covering one nebulizer medication which is why she was changed to Symbicort in combination with her Yupelri. She remains on her cetirizine as well as omeprazole. She tells me today that she thinks that the Xolair did more to control her asthma than the Nucala. She is interested in changing to Xolair again.   She has  some questions today about the seriousness of her condition. After talking with Dr Ricci BarkerMcQuand, it seems that she finally has the insight into how terrible her case is. She is in particular open to meeting with the Hospice folks to see if there are any services that can be provided in that manner.   Otherwise, there have been no changes to her past medical history, surgical history, family history, or social history.  Assessment and Plan:  Anne Rich is a 68 y.o. female with:  Asthma-COPD overlap syndrome - with spirometry   Perennial allergic rhinitis   1. Moderate  persistent asthma with COPD overlap - We are going to transition back to the Xolair since you feel that this was working better.  - Tammy will reach out to discuss this further, although she might have everything that you need.  - Continue with your O2 at 2L up to 4L.   - Daily controller medication(s): Symbicort 160/4.695mcg two puffs twice daily + Yupelri once daily via nebulizer - Rescue medications: Xopenex (levalbuterol) + Atrovent (ipratropium) mixed together every 6 hours as needed via nebulizer - Asthma control goals:  * Full participation in all desired activities (may need albuterol before activity) * Albuterol use two time or less a week on average (not counting use with activity) * Cough interfering with sleep two time or less a month * Oral steroids no more than once a year * No hospitalizations  2. Allergic rhinitis - did allergy shots for three years - Continue with montelukast 10mg  daily.   3. Return in about 3 months (around 03/16/2019).   Diagnostics: None.  Medication List:  Current Outpatient Medications  Medication Sig Dispense Refill  . aspirin 81 MG tablet Take 81 mg by mouth daily.    . budesonide-formoterol (SYMBICORT) 160-4.5 MCG/ACT inhaler Inhale 2 puffs into the lungs 2 (two) times daily. 3 Inhaler 1  . carbamazepine (TEGRETOL) 200 MG tablet Take 200 mg by mouth 2 (two) times daily.    . cetirizine (ZYRTEC) 10 MG tablet Take 10 mg by mouth daily.    . cyclobenzaprine (FLEXERIL) 5 MG tablet Take 5 mg by mouth daily.    . diclofenac sodium (VOLTAREN) 1 % GEL Apply to affected area four times daily as needed for pain.    Marland Kitchen. dicyclomine (BENTYL) 10 MG capsule TAKE 1 CAPSULE (10 MG TOTAL) BY MOUTH 4 TIMES DAILY BEFORE MEALS AND NIGHTLY. AS NEEDED    . EPINEPHrine (EPIPEN 2-PAK) 0.3 mg/0.3 mL IJ SOAJ injection Inject 0.3 mg into the muscle Once PRN.    Marland Kitchen. estradiol (ESTRACE) 1 MG tablet Take 1 mg by mouth daily.  3  . gabapentin (NEURONTIN) 600 MG tablet     .  levalbuterol (XOPENEX HFA) 45 MCG/ACT inhaler Inhale 2 puffs into the lungs every 6 (six) hours as needed for wheezing. 2 Inhaler 1  . levalbuterol (XOPENEX) 1.25 MG/3ML nebulizer solution Mix one vial with ipratropium every 6 hours as needed for coughing or wheezing. 90 mL 3  . levETIRAcetam (KEPPRA) 1000 MG tablet Take 1,000 mg by mouth 2 (two) times daily.    Marland Kitchen. lidocaine (LIDODERM) 5 % APPLY PATCH TO PAINFUL AREA. PATCH MAY REMAIN IN PLACE FOR UP TO 12 HOURS IN A 24 HOUR PERIOD.    Marland Kitchen. lisinopril (PRINIVIL,ZESTRIL) 10 MG tablet Take by mouth.    Marland Kitchen. LORazepam (ATIVAN PO) Take by mouth 3 (three) times daily.    Marland Kitchen. LORazepam (ATIVAN) 1 MG tablet Take 1 mg by mouth 3 (three)  times daily.    . meclizine (ANTIVERT) 25 MG tablet Take by mouth.    . Mepolizumab (NUCALA) 100 MG/ML SOAJ Inject into the skin.    . mirtazapine (REMERON) 30 MG tablet every evening.    . montelukast (SINGULAIR) 10 MG tablet TAKE 1 TABLET BY MOUTH EVERYDAY AT BEDTIME 90 tablet 1  . Multiple Vitamin (MULTIVITAMIN) tablet Take by mouth.    Marland Kitchen. omeprazole (PRILOSEC) 40 MG capsule Take 40 mg by mouth 2 (two) times a day.    . ondansetron (ZOFRAN) 4 MG tablet Take 4 mg by mouth.    . OXYGEN Inhale 3 L into the lungs at bedtime.    . Revefenacin (YUPELRI) 175 MCG/3ML SOLN Inhale 3 mLs into the lungs daily. 90 mL 5  . promethazine-dextromethorphan (PROMETHAZINE-DM) 6.25-15 MG/5ML syrup Take 2.5 mLs by mouth 4 (four) times daily as needed for cough. (Patient not taking: Reported on 12/25/2018) 118 mL 2   Current Facility-Administered Medications  Medication Dose Route Frequency Provider Last Rate Last Dose  . Mepolizumab SOLR 100 mg  100 mg Subcutaneous Q28 days Hetty Blendmbs, Anne M, FNP   100 mg at 12/23/18 [id number]   Allergies: Allergies  Allergen Reactions  . Iodine Itching, Rash and Swelling    IV contrast   . Iodine-131 Itching and Swelling    Pt states causes severe itching Pt states causes severe itching   . Other Shortness Of  Breath  . Pineapple Other (See Comments) and Swelling    Pt states causes her throat swelling Pt states causes her throat swelling   . Pravastatin     Pt states she couldn't lift her legs to walk  . Shellfish Allergy Rash and Swelling    Had reaction to cardiac cath dye  Had seizure ,rash ,itch Oct. 2012 Had reaction to cardiac cath dye  Had seizure ,rash ,itch Oct. 2012  Seizure during Cardiac Cath 2012  . 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 Azithromycin      INEFFECTIVE  . Hydrocodone-Homatropine   . Levofloxacin   . Penicillins   . Shellfish-Derived Products   . Iodinated Diagnostic Agents Rash   I reviewed her past medical history, social history, family history, and environmental history and no significant changes have been reported from previous visits.  Review of Systems  Constitutional: Negative for activity change, appetite change and chills.  HENT: Negative for congestion, postnasal drip, rhinorrhea, sinus pressure and sore throat.   Eyes: Negative for pain, discharge, redness and itching.  Respiratory: Positive for cough and shortness of breath. Negative for wheezing and stridor.   Gastrointestinal: Negative for diarrhea, nausea and vomiting.  Endocrine: Negative for cold intolerance and heat intolerance.  Musculoskeletal: Negative for arthralgias, joint swelling and myalgias.  Skin: Negative for rash.  Allergic/Immunologic: Negative for environmental allergies and food allergies.    Objective:  Physical exam not obtained as encounter was done via telephone.   Previous notes and tests were reviewed.  I discussed the assessment and treatment plan with the patient. The patient was provided an opportunity to ask questions and all were answered. The patient agreed with the plan and demonstrated an understanding of the instructions.   The patient was advised to call back or seek an in-person evaluation if the symptoms worsen or if the  condition fails to improve as anticipated.  I provided 30 minutes of non-face-to-face time during this encounter.  It was my pleasure to participate in Anne Rich  Reta's care today. Please feel free to contact me with any questions or concerns.   Sincerely,  Valentina Shaggy, MD

## 2018-12-29 ENCOUNTER — Telehealth: Payer: Self-pay | Admitting: *Deleted

## 2018-12-29 NOTE — Telephone Encounter (Signed)
Per Dr Ernst Bowler patient wants to change back over to Xolair because she feels it helps her asthma better.  I will reach out to patient to schedule next appt since she does not already have one set for her next Nucala.  I will reach out to Dr Ernst Bowler so he can advise how soon he wants her to restart Xolair since she just received her Nucala injection on 7/14.

## 2018-12-29 NOTE — Telephone Encounter (Signed)
She is buy and bill so no approval required

## 2018-12-29 NOTE — Telephone Encounter (Signed)
I guess whenever it is approved again... I am not concerned about the two drugs being in her system.  Salvatore Marvel, MD [facility]

## 2018-12-30 NOTE — Telephone Encounter (Signed)
Sounds like a plan! Thanks, Tam Tam!   Does she had an appointment to start her Xolair again?   Salvatore Marvel, MD [facility]

## 2018-12-31 NOTE — Telephone Encounter (Signed)
I will reach out to her today to schedule.

## 2018-12-31 NOTE — Telephone Encounter (Signed)
I called and left message for patient to contact [facility] office to make appt to restart Xolair 150mg  every 14 days in place of her Nucala.  She should plan to restart anytime after 7/28 for next Xolair injection

## 2019-01-01 ENCOUNTER — Telehealth: Payer: Self-pay | Admitting: Pulmonary Disease

## 2019-01-01 NOTE — Telephone Encounter (Signed)
Juanito Doom, MD  Valentina Shaggy, MD; P [facility] Triage Rogelia Mire   Thanks for sharing.   I'll get her in to see someone this week to discuss.   Triage, Ms. Alsteen feels her condition is worsening and she is interested in hospice. Could you get her in a 30 min visit with one of our NP's?   Dorathy Kinsman, Gwenith Daily, MD  Juanito Doom, MD; [facility] Triage Pool        Thanks so much! Thankfully we never have to deal much with hospice in our fields... unless it's an immunodeficiency patient. I appreciate the help with this!   Fara Olden     Above messages were received by BQ and Dr. Ernst Bowler in regards to pt. I have attempted to contact pt to get her scheduled for an appt with an APP but unable to reach her. Left message for pt to return call. Routing to triage so we can be able to follow up on this as pt needs to be seen for an appt.

## 2019-01-05 NOTE — Progress Notes (Addendum)
 @Patient  ID: Anne Rich, female    DOB: August 05, 1950, 68 y.o.   MRN: [id number]  Chief Complaint  Patient presents with  . Follow-up    Patient reports that her sob is doing ok at the moment and is worsened with wet weather. She reports using her rescue inhaler daily.    Referring provider: Ananias PilgrimAsres, Alehegn, MD  HPI: 68 year old female, former smoker quit 1997 (7.5 pack year hx). PMH significant for severe persistent asthma-COPD overlap syndrome, chronic respiratory failure with hypoxia, perennial allergic rhinitis. Patient of Dr. Kendrick FriesMcQuaid, lase seen on 07/03/18. Maintained on Pulmicort/Perforomist twice daily and Yuperlri once daily. Albuterol as needed. Wears 2L oxygen on exertion. On Xolair prescribed by her Allergist Dr. Dellis AnesGallagher.   Previous [facility] encounters: 09/12/2018 Called patient today for acute visit. Patient reports heavier wheezing after using perforomist and budesonide nebulizers twice daily. States that it started back in Oct/Nov 2019 when she first went on the nebulizer but only lasted a minute. Over the last month her wheezing has worsened and lasts 10-15 mins after neb treatments. She uses her Xopenex hfa 4 times a day with improvement. She was given 20mg  prednisone taper by FNP. She is very scared that this is worsening of her disease but also thinks it is a reaction to the nebulizer treatments. Continues wearing 3L oxygen. Referred to pulmonary rehab but likely not starting any time soon d/t COVID-19 outbreak.   01/06/2019 Patient presents today for follow-up stage IV COPD. Feels her condition has worsened, states that she had a few bad nights and couldn't get air in. Took ativan and that calmed her down. Oxygen dropping to 85% on 3L, increased to 4L per Dr. Dellis AnesGallagher. Feels nucala is not working, not taking spiriva d.t kidney function. Transition back to [employer]olair q2weeks. Continues Symbicort 160 and Yupelri. She states that her breathing has been worse with weather changes  and activity level. Damp weather makes it harder to breath. She has been uses her rescue inhaler a lot more frequently. States that she will get sweaty and checkers her oxygen level and it will be in the 80s. Increased oxygen from 2 to 4L. She does not require oxygen at rest. 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 Feels like she can hardly take care of her self. Walks 2 miles every morning. Uses rescue inhaler 20mins before hand and wears 4L. Unable to carry portable oxygen, too heavy. Needs to use rolling walker. States that she is interested in hospice "when the time comes". She understands the seriousness of her chronic respiratory condition and would like to meet with hospice to discuss services that they can provide.   Notes: Oxygen requriment- 4L on exertion Functional status - independent with walker   Allergies  Allergen Reactions  . Iodine Itching, Rash and Swelling    IV contrast   . Iodine-131 Itching and Swelling    Pt states causes severe itching Pt states causes severe itching   . Other Shortness Of Breath  . Pineapple Other (See Comments) and Swelling    Pt states causes her throat swelling Pt states causes her throat swelling   . Pravastatin     Pt states she couldn't lift her legs to walk  . Shellfish Allergy Rash and Swelling    Had reaction to cardiac cath dye  Had seizure ,rash ,itch Oct. 2012 Had reaction to cardiac cath dye  Had seizure ,rash ,itch Oct. 2012  Seizure during Cardiac Cath 2012  .  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 Azithromycin      INEFFECTIVE  . Hydrocodone-Homatropine   . Levofloxacin   . Penicillins   . Shellfish-Derived Products   . Iodinated Diagnostic Agents Rash    Immunization History  Administered Date(s) Administered  . Influenza, High Dose Seasonal PF 03/15/2016, 03/25/2017, 03/06/2018  . Pneumococcal Conjugate-13 04/16/2014  . Pneumococcal Polysaccharide-23 01/29/2011,  06/28/2015  . Tdap 08/07/2014    Past Medical History:  Diagnosis Date  . Asthma     Tobacco History: Social History   Tobacco Use  Smoking Status Former Smoker  . Packs/day: 0.50  . Years: 15.00  . Pack years: 7.50  . Types: Cigarettes  . Quit date: 06/12/1995  . Years since quitting: 23.6  Smokeless Tobacco Never Used   Counseling given: Not Answered   Outpatient Medications Prior to Visit  Medication Sig Dispense Refill  . aspirin 81 MG tablet Take 81 mg by mouth daily.    . carbamazepine (TEGRETOL) 200 MG tablet Take 200 mg by mouth 2 (two) times daily. 1 tab in the morning and 2 at night    . cetirizine (ZYRTEC) 10 MG tablet Take 10 mg by mouth daily.    . cyclobenzaprine (FLEXERIL) 5 MG tablet Take 5 mg by mouth daily.    . diclofenac sodium (VOLTAREN) 1 % GEL Apply to affected area four times daily as needed for pain.    Marland Kitchen. dicyclomine (BENTYL) 10 MG capsule TAKE 1 CAPSULE (10 MG TOTAL) BY MOUTH 4 TIMES DAILY BEFORE MEALS AND NIGHTLY. AS NEEDED    . EPINEPHrine (EPIPEN 2-PAK) 0.3 mg/0.3 mL IJ SOAJ injection Inject 0.3 mg into the muscle Once PRN.    Marland Kitchen. estradiol (ESTRACE) 1 MG tablet Take 1 mg by mouth daily.  3  . gabapentin (NEURONTIN) 600 MG tablet Take 600 mg by mouth 2 (two) times daily. 1 in the morning and 2 at bedtime    . levalbuterol (XOPENEX HFA) 45 MCG/ACT inhaler Inhale 2 puffs into the lungs every 6 (six) hours as needed for wheezing. 2 Inhaler 1  . levalbuterol (XOPENEX) 1.25 MG/3ML nebulizer solution Mix one vial with ipratropium every 6 hours as needed for coughing or wheezing. 90 mL 3  . levETIRAcetam (KEPPRA) 1000 MG tablet Take 1,000 mg by mouth 2 (two) times daily. 1 in the morning and 2 at night    . lidocaine (LIDODERM) 5 % APPLY PATCH TO PAINFUL AREA. PATCH MAY REMAIN IN PLACE FOR UP TO 12 HOURS IN A 24 HOUR PERIOD.    Marland Kitchen. lisinopril (PRINIVIL,ZESTRIL) 10 MG tablet Take 10 mg by mouth daily.     Marland Kitchen. LORazepam (ATIVAN) 1 MG tablet Take 1 mg by mouth 3  (three) times daily.    . meclizine (ANTIVERT) 25 MG tablet Take 25 mg by mouth 2 (two) times daily as needed.     . mirtazapine (REMERON) 30 MG tablet every evening.    . montelukast (SINGULAIR) 10 MG tablet TAKE 1 TABLET BY MOUTH EVERYDAY AT BEDTIME 90 tablet 1  . Multiple Vitamin (MULTIVITAMIN) tablet Take by mouth.    Marland Kitchen. omeprazole (PRILOSEC) 40 MG capsule Take 40 mg by mouth 2 (two) times a day.    . ondansetron (ZOFRAN) 4 MG tablet Take 4 mg by mouth.    . OXYGEN Inhale 3 L into the lungs at bedtime.    . Revefenacin (YUPELRI) 175 MCG/3ML SOLN Inhale 3 mLs into the lungs daily.  90 mL 5  . budesonide-formoterol (SYMBICORT) 160-4.5 MCG/ACT inhaler Inhale 2 puffs into the lungs 2 (two) times daily. 3 Inhaler 1  . Mepolizumab (NUCALA) 100 MG/ML SOAJ Inject into the skin.    . promethazine-dextromethorphan (PROMETHAZINE-DM) 6.25-15 MG/5ML syrup Take 2.5 mLs by mouth 4 (four) times daily as needed for cough. (Patient not taking: Reported on 12/25/2018) 118 mL 2  . LORazepam (ATIVAN PO) Take by mouth 3 (three) times daily.     Facility-Administered Medications Prior to Visit  Medication Dose Route Frequency Provider Last Rate Last Dose  . Mepolizumab SOLR 100 mg  100 mg Subcutaneous Q28 days Hetty Blendmbs, Anne M, FNP   100 mg at 12/23/18 0920    Review of Systems  Review of Systems  HENT: Negative.   Respiratory: Positive for shortness of breath and wheezing. Negative for cough.   Cardiovascular: Negative.   Psychiatric/Behavioral: The patient is nervous/anxious.     Physical Exam  BP 114/62 (BP Location: Left Arm, Cuff Size: Normal)   Pulse 68   Temp 97.6 F (36.4 C) (Oral)   Ht 5\' 8"  (1.727 m)   Wt 158 lb 3.2 oz (71.8 kg)   SpO2 99%   BMI 24.05 kg/m  Physical Exam Constitutional:      Comments: Thin/frail   Neck:     Musculoskeletal: Normal range of motion and neck supple.  Cardiovascular:     Rate and Rhythm: Normal rate and regular rhythm.  Pulmonary:     Effort: Pulmonary  effort is normal.     Breath sounds: Wheezing present.  Skin:    General: Skin is warm and dry.  Neurological:     General: No focal deficit present.     Mental Status: She is alert and oriented to person, place, and time. Mental status is at baseline.  Psychiatric:        Behavior: Behavior normal.        Thought Content: Thought content normal.        Judgment: Judgment normal.     Comments: Anxious, somewhat flat      Lab Results:  CBC    Component Value Date/Time   WBC 9.7 09/23/2018 1021   RBC 4.77 09/23/2018 1021   HGB 14.7 09/23/2018 1021   HCT 44.3 09/23/2018 1021   PLT 380 09/23/2018 1021   MCV 93 09/23/2018 1021   MCH 30.8 09/23/2018 1021   MCHC 33.2 09/23/2018 1021   RDW 12.5 09/23/2018 1021   LYMPHSABS 2.7 09/23/2018 1021   EOSABS 0.2 09/23/2018 1021   BASOSABS 0.1 09/23/2018 1021    BMET No results found for: NA, K, CL, CO2, GLUCOSE, BUN, CREATININE, CALCIUM, GFRNONAA, GFRAA  BNP No results found for: BNP  ProBNP No results found for: PROBNP  Imaging: No results found.   Assessment & Plan:   Asthma-COPD overlap syndrome (HCC) - Stage IV COPD; increased symptom burden and oxygen demand - Frequent SABA use  - Recently changed from Nucala back to Xolair per allergy  - Recommend changing Symbicort to nebulized ICS/LABA  - Start Pulmicort 0.5mg /802ml twice daily; Perforomist 3820mcg/2ml twice daily - Continue Yupelri daily  - RX prednisone taper for wheezing - Referral to palliative care   Chronic respiratory failure with hypoxia (HCC) - O2 99% RA at rest; desaturated 84% after 3 laps - Needs 4L pulsed oxygen on exertion   Physical deconditioning - Unable to carry portable oxygen canister  - Needs rolator walker   Counseling regarding end of life decision making -  Discussed differences between palliative care and hospice. At present time I do not think patient would meet requirements for hospice. She is fairly independent and on room air her  oxygen level is 99%. She does require 4L on exertion. D/t chronic nature of her condition reommending palliative care consult to help with disease management, setting goals of care and medication management    Martyn Ehrich, NP 01/20/2019

## 2019-01-06 ENCOUNTER — Other Ambulatory Visit: Payer: Self-pay

## 2019-01-06 ENCOUNTER — Encounter: Payer: Self-pay | Admitting: Primary Care

## 2019-01-06 ENCOUNTER — Ambulatory Visit (INDEPENDENT_AMBULATORY_CARE_PROVIDER_SITE_OTHER): Payer: Medicare Other | Admitting: Primary Care

## 2019-01-06 VITALS — BP 114/62 | HR 68 | Temp 97.6°F | Ht 68.0 in | Wt 158.2 lb

## 2019-01-06 DIAGNOSIS — R5381 Other malaise: Secondary | ICD-10-CM | POA: Diagnosis not present

## 2019-01-06 DIAGNOSIS — J9611 Chronic respiratory failure with hypoxia: Secondary | ICD-10-CM

## 2019-01-06 DIAGNOSIS — Z7189 Other specified counseling: Secondary | ICD-10-CM

## 2019-01-06 DIAGNOSIS — J449 Chronic obstructive pulmonary disease, unspecified: Secondary | ICD-10-CM

## 2019-01-06 MED ORDER — PREDNISONE 10 MG PO TABS: ORAL_TABLET | ORAL | 0 refills | Status: DC

## 2019-01-06 NOTE — Assessment & Plan Note (Addendum)
-   Stage IV COPD; increased symptom burden and oxygen demand - Frequent SABA use  - Recently changed from [place] back to Xolair per allergy  - Recommend changing Symbicort to nebulized ICS/LABA  - Start Pulmicort 0.5mg /7ml twice daily; Perforomist 71mcg/2ml twice daily - Continue Yupelri daily  - RX prednisone taper for wheezing - Referral to palliative care

## 2019-01-06 NOTE — Patient Instructions (Addendum)
Recommendations: - Continue Symbicort 160 twice daily - Continue Yupelri once daily - Albuterol 2 puffs every 4-6 hours as needed for shortness of breath/wheezing   Oxygen: - Use 4L on exertion   Rx: - Prednisone taper for acute exacerbation today   Referral: - Pharmacy consult for COPD medication management (look into changing Symbicort to nebulized ICS/LABA) - Palliative care consult  Re: stage IV COPD/asthma  Follow up: - 3 months with Dr. Lake Bells

## 2019-01-06 NOTE — Assessment & Plan Note (Addendum)
-   Discussed differences between palliative care and hospice. At present time I do not think patient would meet requirements for hospice. She is fairly independent and on room air her oxygen level is 99%. She does require 4L on exertion. D/t chronic nature of her condition reommending palliative care consult to help with disease management, setting goals of care and medication management

## 2019-01-06 NOTE — Telephone Encounter (Signed)
Pt had an appt today, 7/28 with Derl Barrow. Closing encounter.

## 2019-01-06 NOTE — Assessment & Plan Note (Addendum)
-   O2 99% RA at rest; desaturated 84% after 3 laps - Needs 4L pulsed oxygen on exertion

## 2019-01-06 NOTE — Assessment & Plan Note (Signed)
-   Unable to carry portable oxygen canister  - Needs rolator walker

## 2019-01-07 ENCOUNTER — Encounter: Payer: Self-pay | Admitting: Primary Care

## 2019-01-07 NOTE — Progress Notes (Signed)
Reviewed, agree with palliative care

## 2019-01-09 ENCOUNTER — Telehealth: Payer: Self-pay | Admitting: Pharmacist

## 2019-01-09 MED ORDER — BUDESONIDE 0.5 MG/2ML IN SUSP: 0.5000 mg | Freq: Two times a day (BID) | RESPIRATORY_TRACT | 6 refills | Status: DC

## 2019-01-09 MED ORDER — FORMOTEROL FUMARATE 20 MCG/2ML IN NEBU: 20.0000 ug | INHALATION_SOLUTION | Freq: Two times a day (BID) | RESPIRATORY_TRACT | 6 refills | Status: DC

## 2019-01-09 NOTE — Telephone Encounter (Signed)
Please start BIV for nebulized ICS/LABA.  Options include Pulmicort Respules (budesonide), Performist (formoterol), and Brovana (aformoterol).   Mariella Saa, PharmD, [place], [place] [facility] Pharmacist [phone]  01/09/2019 10:16 AM

## 2019-01-09 NOTE — Telephone Encounter (Signed)
-----   Message from Martyn Ehrich, NP sent at 01/06/2019 10:33 AM EDT ----- Regarding: COPD medication management Patient of Dr. Kathee Delton. Stage IV COPD/asthma overlap. Currently on Symbicort 160 and Yupelri. She has been requiring her rescue inhaler more. I am interested in changing her Symbicort to nebulized ICS/LABA. Could you help me with this???  I am also referring her to palliative care for end-stage COPD  -Derl Barrow, NP [facility]   #Also how would prefer to be contacted- Staff message, telephone message or will there be a pharmacy consult referral order

## 2019-01-09 NOTE — Telephone Encounter (Signed)
Findings of benefits investigation via test claims at [facility] :  Insurance: [employer] [facility] Medicare Part D plan   Brand- Pulmicort Resp - # 1 box for a 1 month supply through patient's insurance is $ 243.87.  Generic- Budesonide nebulizer solution - # 1 box for a 1 month supply through patient's insurance is $ 16.67.  Perforomist nebs - # 1 box for a 1 month supply through patient's insurance is $ 105.22. - no generic  Brovana nebs - # 1 box for a 1 month supply through patient's insurance is $ 424.97. No generic- non formulary  ** Patient has already met her prescription deductible**  10:38 AM Beatriz Chancellor, CPhT

## 2019-01-09 NOTE — Telephone Encounter (Signed)
Fax sent, confirmation received. Nothing further needed at this time.

## 2019-01-09 NOTE — Telephone Encounter (Signed)
Can you send RX for Perforomist and Budesonide through [facility]. Printed

## 2019-01-12 ENCOUNTER — Telehealth: Payer: Self-pay | Admitting: Pulmonary Disease

## 2019-01-12 DIAGNOSIS — J4540 Moderate persistent asthma, uncomplicated: Secondary | ICD-10-CM | POA: Diagnosis not present

## 2019-01-13 ENCOUNTER — Ambulatory Visit (INDEPENDENT_AMBULATORY_CARE_PROVIDER_SITE_OTHER): Payer: Medicare Other

## 2019-01-13 ENCOUNTER — Other Ambulatory Visit: Payer: Self-pay

## 2019-01-13 DIAGNOSIS — J4540 Moderate persistent asthma, uncomplicated: Secondary | ICD-10-CM | POA: Diagnosis not present

## 2019-01-13 DIAGNOSIS — J449 Chronic obstructive pulmonary disease, unspecified: Secondary | ICD-10-CM

## 2019-01-13 MED ORDER — OMALIZUMAB 150 MG ~~LOC~~ SOLR
150.0000 mg | SUBCUTANEOUS | Status: DC
  Administered 2019-01-13 – 2021-11-02 (×59): 150 mg via SUBCUTANEOUS

## 2019-01-13 NOTE — Telephone Encounter (Signed)
These orders were placed on 01/06/2019.  [facility] - can you help with this?

## 2019-01-13 NOTE — Telephone Encounter (Signed)
I have called [facility] they require a call where the other pallitive care have a WQ I have also sent a message to [facility]

## 2019-01-13 NOTE — Telephone Encounter (Signed)
Anne Rich was delivered yesterday and [facility] pall[facility] should be calling her today

## 2019-01-20 ENCOUNTER — Telehealth: Payer: Self-pay | Admitting: Pulmonary Disease

## 2019-01-20 NOTE — Telephone Encounter (Signed)
Called and spoke with pt. Pt said that she has been using her xopenex rescue inhaler three times daily but said that she is not really getting any relief from it. Pt said that she has not yet received the nebulizer solutions that were prescribed for pt in place of the symbicort inhaler. The neb solutions that were prescribed were pulmicort and perforomist. I told pt that I would call [employer] which is the DME where the solutions were sent to see if they can give [place] an update and pt verbalized understanding.   While also speaking to pt, pt said that hospice came out to do evaluation. While they were there, they began talking about morphine with pt but pt said to them that at this time she did not want morphine. When they saw that pt was on ativan, they stated to her that her dose might need to be upped from 1mg  to 2mg  so this could help with her SOB and also her anxiety. Pt currently is prescribed 1mg  to take three times daily but pt has been having a lot of problems recently with her breathing and this was why the NP said that her dose might need to be changed to 2mg .   Called [facility] and spoke with Bethanne Ginger asking if the nebulizer solution Rx had been received and Bethanne Ginger stated that it was sent to [facility] in [place]. Bethanne Ginger said that she was able to see that the order had been received and it looked like they were working on it but there were no notes in regards to why pt had not received them yet. Ross at [phone] and spoke with Melissa to check status of neb solutions. Per Melissa, the prescriptions were received for the two nebulizer solutions but there needs to be an addendum to pt's last OV stating that pt is going to be starting the two solutions and also needs to have stated the dose and instructions on both of the solutions. After this has been taken care of, they should be able to get the prescriptions to pt.

## 2019-01-20 NOTE — Telephone Encounter (Signed)
Called spoke with patient and discussed Beth NP's response.  Patient stated that she was told that Hospice told her that they had to come to her home to evaluate before Palliative care.  Patient also stated that she is okay with the increase in Ativan but was told that Hospice "would need to look into" the increase the Ativan rather than going ahead and doing this.  They came to patient's home 1 week ago.  Patient expressed some confusion on whom to call for symptoms - does she call this office or Palliative/Hospice?  She stated that she does not feel comfortable calling this office so frequently.  Advised patient will call Hospice for her to help figure this out.  Discussed with Beth NP: patient should contact them for symptoms.  If they are unable to increase her Ativan at this time, we can send it a week's supply of the increased dose.  PCCs assisted with name of Hospice agency >> [facility] [phone]  Hospice's normal business hours are 8a-5p.  Patient was already aware this may be the case and is okay with a call back tomorrow.

## 2019-01-20 NOTE — Telephone Encounter (Signed)
I referred her to palliative care I believe. Is she now on Hospice? Hospice usually orders their own medication unless one of our doctors is the attending which would be McQuaid. Otherwise we can increase her ativan per hospice recommendations. I will attend note now. Please fax   Thank you for you help with this

## 2019-01-21 NOTE — Telephone Encounter (Signed)
Yes, that all sounds perfectly reasonable. Thank you for all your hard work. Keep me updated. Happy to help

## 2019-01-21 NOTE — Telephone Encounter (Signed)
Called [facility] and spoke with Anne Rich.  Per Anne Rich:  Admission nurses come to the patient's home 1st to doan introduction to both programs [facility] and Palliative)  Patient is not eligible for Palliative through [facility] because she is not enrolled into [facility].   [facility] program is ongoing, ie multiple Palliative visits).    The nurse that went to pt's home did tell her about the regular Palliative program which is only really one visit/consult by the Palliative care physician.  Patient told the nurse that she still [place], wears her O2 and takes her neb treatments.  Patient does not feel ready for Hospice at this point but does want to be seen by the Palliative Care physician for consult (again, this will be a one-time visit)  For regular Palliative, the patient contacts this office for her symptoms; their physician does not order.  That physician will go to patient's home to do a Goals of Care conversation and if there is something that might be helpful to the patient, they will call us to report and offer recommendations.  Given the above, if patient's Ativan is indeed increased it will need to be done by this office and managed by this office.  Anne Rich, would you like patient to be referred to [facility] so that she can hopefully be referred to their Palliative program?  Thank you.

## 2019-01-22 NOTE — Telephone Encounter (Signed)
Referral placed for [facility] so that patient can hopefully benefit from their services and begin Palliative care under their service.  LMOM TCB x1 to discuss with patient

## 2019-01-22 NOTE — Telephone Encounter (Signed)
PT RETURNING CALL AND CAN BE REACHED @ [phone].Anne Rich

## 2019-01-22 NOTE — Telephone Encounter (Signed)
Call went straight to voicemail. LMTCB x1 for pt.

## 2019-01-23 MED ORDER — LEVALBUTEROL HCL 0.63 MG/3ML IN NEBU: 0.6300 mg | INHALATION_SOLUTION | Freq: Four times a day (QID) | RESPIRATORY_TRACT | 12 refills | Status: DC | PRN

## 2019-01-23 NOTE — Telephone Encounter (Signed)
Q 6 hours.  Printed and signed

## 2019-01-23 NOTE — Telephone Encounter (Signed)
Spoke with pt, I advised her about the referral to [facility]. She also stated that she was unable to get a refill for her Xopenex because the pharmacist said it wasn't time for her refill. I asked the pt how much she was using it, she said three times a day but she has to sometimes do three puffs at a time. This may be the cause of her running short before her next refill. I feel like she may be using too much of the Xopenex. Beth please advise.

## 2019-01-23 NOTE — Telephone Encounter (Signed)
I spoke with pt, she would like to try the xopenex in nebulizer form. Please advise on the directions.

## 2019-01-23 NOTE — Telephone Encounter (Signed)
Prescription obtained and faxed to [place].   Call made to patient to make aware. Voiced understanding. Nothing further is needed at this time.

## 2019-01-23 NOTE — Telephone Encounter (Signed)
Please change order to xopenex 2 puffs every 4-6 hours. If she doesn't have a nebulizer we can change to neb form if she would like

## 2019-01-26 DIAGNOSIS — J4550 Severe persistent asthma, uncomplicated: Secondary | ICD-10-CM | POA: Diagnosis not present

## 2019-01-27 ENCOUNTER — Ambulatory Visit (INDEPENDENT_AMBULATORY_CARE_PROVIDER_SITE_OTHER): Payer: Medicare Other

## 2019-01-27 ENCOUNTER — Other Ambulatory Visit: Payer: Self-pay

## 2019-01-27 DIAGNOSIS — J4550 Severe persistent asthma, uncomplicated: Secondary | ICD-10-CM

## 2019-01-28 ENCOUNTER — Telehealth: Payer: Self-pay | Admitting: Pulmonary Disease

## 2019-01-28 NOTE — Telephone Encounter (Signed)
Attempted to call back. Office hours are 8-5 Monday-Friday.  Will leave in triage box for tomorrow.

## 2019-01-29 NOTE — Telephone Encounter (Signed)
Spoke with Manus Gunning with Hospice. States that Janett Billow called asking about how to refer pt's to Care Connections. Manus Gunning states that this pt in particular would not be eligible for this program due to her insurance. This is documented on the referral that was placed for this. Nothing further was needed.

## 2019-02-04 ENCOUNTER — Telehealth: Payer: Self-pay

## 2019-02-04 NOTE — Telephone Encounter (Signed)
[facility] request for non-invasive mechanical ventilor confirmation sheet faxed here.  Geraldo Pitter, NP reviewed order and signed.  Order faxed back to [place] with successful transmillsion and then sent to scan file.  Nothing further needed.

## 2019-02-06 ENCOUNTER — Telehealth: Payer: Self-pay | Admitting: Pulmonary Disease

## 2019-02-06 ENCOUNTER — Encounter: Payer: Self-pay | Admitting: Primary Care

## 2019-02-06 ENCOUNTER — Ambulatory Visit (INDEPENDENT_AMBULATORY_CARE_PROVIDER_SITE_OTHER): Payer: Medicare Other | Admitting: Primary Care

## 2019-02-06 DIAGNOSIS — R0602 Shortness of breath: Secondary | ICD-10-CM | POA: Diagnosis not present

## 2019-02-06 DIAGNOSIS — J449 Chronic obstructive pulmonary disease, unspecified: Secondary | ICD-10-CM

## 2019-02-06 MED ORDER — PREDNISONE 10 MG PO TABS: ORAL_TABLET | ORAL | 0 refills | Status: DC

## 2019-02-06 NOTE — Telephone Encounter (Signed)
Spoke with the pt  She states having increased SOB x 4 days  She has been having to use her o2 more  Would like to discuss starting back on lorazepam  She denies any body aches, fever, chills, cough, sore throat or other co's  She is taking the symbicort and yupelri as prescribed but says her ins would not cover any albuterol  I have scheduled her for televisit with Beth at 2 pm today

## 2019-02-06 NOTE — Progress Notes (Signed)
Virtual Visit via Telephone Note  I connected with Anne Rich on 02/06/19 at  2:00 PM EDT by telephone and verified that I am speaking with the correct person using two identifiers.  Location: Patient: Home Provider: Office   I discussed the limitations, risks, security and privacy concerns of performing an evaluation and management service by telephone and the availability of in person appointments. I also discussed with the patient that there may be a patient responsible charge related to this service. The patient expressed understanding and agreed to proceed.   History of Present Illness: 68 year old female, former smoker quit 1997 (7.5 pack year hx). PMH significant for severe persistent asthma-COPD overlap syndrome, chronic respiratory failure with hypoxia, perennial allergic rhinitis. Patient of Dr. Kendrick FriesMcQuaid, lase seen on 07/03/18. Maintained on Pulmicort/Perforomist twice daily and Yuperlri once daily. Albuterol as needed. Wears 2L oxygen on exertion. On Xolair prescribed by her Allergist Dr. Dellis AnesGallagher.   Previous [facility] encounters: 09/12/2018- Acute televist  Called patient today for acute visit. Patient reports heavier wheezing after using perforomist and budesonide nebulizers twice daily. States that it started back in Oct/Nov 2019 when she first went on the nebulizer but only lasted a minute. Over the last month her wheezing has worsened and lasts 10-15 mins after neb treatments. She uses her Xopenex hfa 4 times a day with improvement. She was given 20mg  prednisone taper by FNP. She is very scared that this is worsening of her disease but also thinks it is a reaction to the nebulizer treatments. Continues wearing 3L oxygen. Referred to pulmonary rehab but likely not starting any time soon d/t COVID-19 outbreak.   01/06/2019- Follow-up COPD Patient presents today for follow-up stage IV COPD. Feels her condition has worsened, states that she had a few bad nights and couldn't get  air in. Took ativan and that calmed her down. Oxygen dropping to 85% on 3L, increased to 4L per Dr. Dellis AnesGallagher. Feels nucala is not working, not taking spiriva d.t kidney function. Transition back to [employer] q2weeks. Continues Symbicort 160 and Yupelri. She states that her breathing has been worse with weather changes and activity level. Damp weather makes it harder to breath. She has been uses her rescue inhaler a lot more frequently. States that she will get sweaty and checkers her oxygen level and it will be in the 80s. Increased oxygen from 2 to 4L. She does not require oxygen at rest. 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 Feels like she can hardly take care of her self. Walks 2 miles every morning. She is independent with walker. Uses rescue inhaler 20mins before hand and wears 4L. Unable to carry portable oxygen, too heavy. Needs to use rolling walker. States that she is interested in hospice "when the time comes". She understands the seriousness of her chronic respiratory condition and would like to meet with hospice to discuss services that they can provide.   02/06/2019 - Acute televisit Patient contacted today for televisit with acute complaints of increased shortness of breath x 4 days. Increased oxygen to 4L on exertion and 3L at night. Using pulmicort/perforomist twice daily and Yupeli daily as prescribed. Anxiety and humidity worsens her symptoms. She is very anxious. She takes ativan 1mg  three times daily but feels she needs a stronger dose. She is receiving prescription from psychiatry. She is very scared/nervous of what to do when she can't breathing at night. She doesn't want to be by herself and feels like she can't take care  of herself any more. States that she is not ready give up. She originally requested hospice services back in July. She tells me that a physician with hospice was over her house yesterday but there are no notes for review. She does not qualify for [facility]  palliative care services. She is reluctant to let people into her house but assures me that she will meet me in the middle and let nursing come in for an evaluation to if see if they can help in any way. Palliative care will likely be able to reach out to patient early next week and patient is aware.   Observations/Objective:  - Mild dyspnea with talking - Very anxious   Assessment and Plan:  COPD GOLD IV - Acute exacerbation versus worsening of chronic disease - No significant cough or congestion, abx not indicated  - RX slow prednisone taper (40mg  x 5 days; 20mg  x 5 days; 10mg  x 5 days) - Continue Pulmicort/Perforomist twice daily - Continue Yupelri once daily - Refer to palliative care for symptom management/goals of care  - Home nursing eval/treat   Follow Up Instructions:  - Needs to establish with new pulmonary provider, former McQuaid patient - Apt made with Dr. Carlis Abbott for September 10th   I discussed the assessment and treatment plan with the patient. The patient was provided an opportunity to ask questions and all were answered. The patient agreed with the plan and demonstrated an understanding of the instructions.   The patient was advised to call back or seek an in-person evaluation if the symptoms worsen or if the condition fails to improve as anticipated.  I provided 30 minutes of non-face-to-face time during this encounter.   Martyn Ehrich, NP

## 2019-02-06 NOTE — Patient Instructions (Addendum)
  Recommendations: - Continue Pulmicort nebulizer twice daily - Continue Perforomist nebulizer twice daily   Rx: - Prednisone 40mg  x 5 days; 20mg  x 5 days; 10 mg x 5 days   Referral: - Home nursing eval/treat re: COPD IV  - Palliative care for COPD IV symptoms management- does not qualify for [facility] services   Follow-up: Scheduled for Sept 10th at 10am with Dr. Ernest Mallick (previous McQuaid patient)

## 2019-02-09 ENCOUNTER — Telehealth: Payer: Self-pay | Admitting: Internal Medicine

## 2019-02-09 DIAGNOSIS — J4550 Severe persistent asthma, uncomplicated: Secondary | ICD-10-CM | POA: Diagnosis not present

## 2019-02-09 NOTE — Telephone Encounter (Signed)
Called son Richardson Landry and explained Palliative services with him and answered all questions to his safisfaction.  He was in agreement with this. I told son that I would call patient in the morning to schedule the Consult that way he can call her this evening and talk to her about Palliative services.

## 2019-02-09 NOTE — Progress Notes (Signed)
Reviewed, agree 

## 2019-02-09 NOTE — Telephone Encounter (Signed)
Spoke with patient regarding Palliative services.  She requested that I contact her son Richardson Landry, who is POA and explain Palliative services to him so that he explain to her.  Then she said I could call her back to schedule the Consult.

## 2019-02-10 ENCOUNTER — Telehealth: Payer: Self-pay | Admitting: Primary Care

## 2019-02-10 ENCOUNTER — Ambulatory Visit (INDEPENDENT_AMBULATORY_CARE_PROVIDER_SITE_OTHER): Payer: Medicare Other

## 2019-02-10 ENCOUNTER — Telehealth: Payer: Self-pay | Admitting: Internal Medicine

## 2019-02-10 ENCOUNTER — Other Ambulatory Visit: Payer: Self-pay

## 2019-02-10 DIAGNOSIS — J4550 Severe persistent asthma, uncomplicated: Secondary | ICD-10-CM

## 2019-02-10 DIAGNOSIS — R0602 Shortness of breath: Secondary | ICD-10-CM

## 2019-02-10 NOTE — Telephone Encounter (Signed)
If she has had no fever or known sick contact and she is feeling better it is ok to cancel. Thank you

## 2019-02-10 NOTE — Telephone Encounter (Signed)
Called and spoke to [place] with [employer]. Informed her of the recs per [facility]. Levada Dy verbalized understanding and denied any further questions or concerns at this time.

## 2019-02-10 NOTE — Telephone Encounter (Signed)
Called and spoke to [place]. Levada Dy is requesting a verbal order for the nursing visits to be once a week for 9 weeks for disease process management and medication management. Levada Dy also states the pt is requesting her ativan to go back to 2mg  BID instead of her current rx of 1mg  TID. After getting off the phone with Levada Dy I saw in Beth's note it is being prescribed by psychiatry and has never been rx'd by pulmonary, will need to let Levada Dy know to defer the request to psychiatry.   Beth, please advise if ok for nursing visits to be once a week for 9 weeks. Thanks.

## 2019-02-10 NOTE — Telephone Encounter (Signed)
Called patient back to schedule Consult, no answer.  Left message with my contact information requesting a call back.

## 2019-02-10 NOTE — Telephone Encounter (Signed)
Called patient back and talked again about Pallative services and all questions answered to her satisfaction and she was in agreement with this.  I have scheduled a Telephone Palliative Consult for 02/13/19 @ 10:30 AM

## 2019-02-10 NOTE — Telephone Encounter (Signed)
Before visit with Dr. Carlis Abbott on SEPT 10th can we please order echocardiogram for dyspnea. She also needs labs (BNP, CMET, CBC with diff) prior to visit. Thank you  *If she has had these tests recently then can we get a copy

## 2019-02-10 NOTE — Telephone Encounter (Signed)
Called and spoke to pt. Informed her of the recs per [facility]. The pt states she has not been in contact with anyone known to have covid and is feeling much better and is afebrile. Pt aware test can be canceled. Pt also aware the echo and labs need to be done prior to her appt with Dr. Carlis Abbott. Nothing further needed at this time.

## 2019-02-10 NOTE — Telephone Encounter (Signed)
Called and spoke to pt. Informed her of the recs per [facility]. Orders placed. Pt verbalized understanding. Pt questioned if she could have the COVID test cancelled. She is feeling better after starting the pred and her SOB has improved.   Beth please advise if the COVID test can be canceled. Thanks.

## 2019-02-10 NOTE — Telephone Encounter (Signed)
Yes to the first request. And ativan needs to be directed to psychiatry. Thanks

## 2019-02-11 ENCOUNTER — Telehealth: Payer: Self-pay | Admitting: Primary Care

## 2019-02-11 NOTE — Telephone Encounter (Signed)
Ashly from [facility] needs an update on patient's chart note 01/06/19 from E. Volanda Napoleon, NP to complete the order for the non-invasive vent.  He needs the note to state something like this:  Patient has chronic respiratory failure resulting from COPD and ventilator is ordered to sustain life and reduce hospitalizations.  Will route to Geraldo Pitter, NP for update to note as he states she is the only provider who can do this update. She is out of the office until 02/17/19.

## 2019-02-13 ENCOUNTER — Other Ambulatory Visit: Payer: Self-pay

## 2019-02-13 ENCOUNTER — Other Ambulatory Visit: Payer: Medicare Other | Admitting: Internal Medicine

## 2019-02-17 NOTE — Telephone Encounter (Signed)
Ashly returning call to [facility] and can be reached @ [phone].Hillery Hunter

## 2019-02-17 NOTE — Telephone Encounter (Signed)
Left message for Anne Rich to call back.

## 2019-02-17 NOTE — Telephone Encounter (Signed)
I do not recall ordering a trilogy ventilator for this patient. I know that she has palliative care involved. She has an apt with our new Physician Dr. Carlis Abbott who will be taking over for Dr. Lake Bells.

## 2019-02-18 ENCOUNTER — Ambulatory Visit (HOSPITAL_COMMUNITY): Payer: Medicare Other | Attending: Cardiology

## 2019-02-18 ENCOUNTER — Other Ambulatory Visit: Payer: Self-pay

## 2019-02-18 DIAGNOSIS — R0602 Shortness of breath: Secondary | ICD-10-CM | POA: Insufficient documentation

## 2019-02-18 NOTE — Telephone Encounter (Signed)
Called and spoke to [place] with [employer] regarding Beth's response below. Ashly states that [employer] signed the order for the trilogy ventilator; therefore, he is "unsure why she does not recall ordering one." I let him know pt will be establishing with a new provider, Dr. Noemi Chapel, tomorrow 02/19/2019, where the prospect of pt receiving a trilogy vent can be discussed. Ashly verbalized understanding with no additional questions. Routing to Dr. Carlis Abbott as an Juluis Rainier. Nothing further needed at this time.

## 2019-02-19 ENCOUNTER — Ambulatory Visit (INDEPENDENT_AMBULATORY_CARE_PROVIDER_SITE_OTHER): Payer: Medicare Other | Admitting: Critical Care Medicine

## 2019-02-19 ENCOUNTER — Encounter: Payer: Self-pay | Admitting: Critical Care Medicine

## 2019-02-19 VITALS — BP 110/70 | HR 85 | Temp 97.5°F | Ht 68.0 in | Wt 158.0 lb

## 2019-02-19 DIAGNOSIS — F419 Anxiety disorder, unspecified: Secondary | ICD-10-CM | POA: Diagnosis not present

## 2019-02-19 DIAGNOSIS — J449 Chronic obstructive pulmonary disease, unspecified: Secondary | ICD-10-CM

## 2019-02-19 DIAGNOSIS — J9611 Chronic respiratory failure with hypoxia: Secondary | ICD-10-CM | POA: Diagnosis not present

## 2019-02-19 DIAGNOSIS — Z23 Encounter for immunization: Secondary | ICD-10-CM

## 2019-02-19 DIAGNOSIS — J4489 Other specified chronic obstructive pulmonary disease: Secondary | ICD-10-CM

## 2019-02-19 MED ORDER — LEVALBUTEROL TARTRATE 45 MCG/ACT IN AERO: 1.0000 | INHALATION_SPRAY | Freq: Four times a day (QID) | RESPIRATORY_TRACT | 12 refills | Status: DC | PRN

## 2019-02-19 NOTE — Patient Instructions (Addendum)
Thank you for visiting Dr. Carlis Abbott at [facility]. We recommend the following: Orders Placed This Encounter  Procedures  . Flu Vaccine QUAD High Dose(Fluad)   Orders Placed This Encounter  Procedures  . Flu Vaccine QUAD High Dose(Fluad)    Meds ordered this encounter  Medications  . levalbuterol (XOPENEX HFA) 45 MCG/ACT inhaler    Sig: Inhale 1 puff into the lungs every 6 (six) hours as needed for wheezing or shortness of breath.    Dispense:  1 Inhaler    Refill:  12    The doctors from [facility] will be in charge of prescribing your anxiety medications.  They are reaching out to you to enroll soon.   Return in about 2 months (around 04/21/2019).     Please do your part to reduce the spread of COVID-19.

## 2019-02-19 NOTE — Telephone Encounter (Signed)
Patient was seen today by Dr. Noemi Chapel. Spoke with patient about the Trilogy vent. She had no idea about what the vent was or why it needed to be ordered. Patient has refused this. Advised her that I would call Ashly and advise him of her wishes.   Spoke with Ashly. He was made aware that the patient does not want to proceed with the vent order. He apologized for any confusion.   Nothing further needed at time of call.

## 2019-02-19 NOTE — Progress Notes (Signed)
Synopsis: Former patient of Dr. [name] with asthma/COPD overlap, chronic hypoxic respiratory failure. Has been referred to hospice   Subjective:   PATIENT ID: Anne Rich GENDER: female DOB: 1950-09-04, MRN: [id number]  Chief Complaint  Patient presents with  . New Patient (Initial Visit)    Switching from [facility] to [facility] for SOB. States her breathing is slowly getting back to normal.     Anne Rich is a 68 year old woman with a history of severe persistent asthma/COPD overlap syndrome, chronic hypoxic respiratory failure on 2 L home oxygen, and perennial allergic rhinitis. She quit smoking in 1997 after a 7.5 pack year history. She is on omalizumab per her allergist Dr. Dellis Anes, Symbicort, and nebulized ICS, antimuscarinic, and beta agonist (Pulmicort, Yupelri, and Perforomist).  She has significant anxiety, which her psychiatrist has been treating with PRN benzodiazepines.  She requested hospice services in July 2020; recent notes from her primary care doctor at [facility] indicate hospice of [facility] has been in touch with them. Her baseline activity level is being independent with use of a walker.  She is able to walk 2 miles every morning. Was participating in pulmonary rehab through [facility] from mid-October 2 mid-December 2019.  She did not complete the program due to leg pain and personal reasons per their notes. She had exacerbations in February & April 2020, for which she was given prednisone.  In late August she was having worsened symptoms, and via tele-visit a 15-day prednisone taper was prescribed.  She was also referred to palliative care for assistance with symptom management and clarification of her goals of care moving forward.  She had a visit with them on 02/13/2019, where she declined their services.  In early August she declined the services of hospice due to feeling overwhelmed, but is willing to accept their services.  Home health care services have been  prescribed.  Previously [facility] has requested a home ventilator for the patient, but she does not want to use the home ventilator.  Currently she is in the middle of her prednisone taper, taking 10 mg of prednisone daily.  She felt better at the 30 mg daily dose, but does not feel like at the lower doses she has any benefit.  She has more energy at the higher doses.  Her cough and dyspnea are at baseline, which is occasional productive production of yellow sputum, and infrequent coughing. She has been complaint with her meds. She takes xopenex 3 puffs prior to walking. She has not received her xopenex neb solution yet, but she has the rescue inhaler.  Her allergies are well controlled.  She has shortness of breath with activity, having to stop during her 2 mile walk.  She continues to exercise in a group setting every morning, 6 days/week.  She is still able to care for herself and takes great pride in doing things to help other people in her senior living community.  She has a lot of anxiety, especially associated with shortness of breath in the evenings.  She is anxious about her Ativan dose, and regrets that it is not the 2 mg dose that she was taking for years for her seizure disorder.  Her Psychiatrist decreased the dose, but she is reluctant to continue to follow-up with them because she does not want to do tele-visits.  She endorses frequent crying, but is looking forward to working with the social work Veterinary surgeon.  She hopes to eventually get back to pulmonary rehab at [facility], but reports that they are currently closed and she needs to get her back injection completed before she could participate.      Past Medical History:  Diagnosis Date  . Asthma      Family History  Problem Relation Age of Onset  . Allergic rhinitis Neg Hx   . Angioedema Neg Hx   . Asthma Neg Hx   . Eczema Neg Hx   . Immunodeficiency Neg Hx   . Urticaria Neg Hx      Past Surgical History:  Procedure  Laterality Date  . ABDOMINAL HYSTERECTOMY    . APPENDECTOMY    . CESAREAN SECTION      Social History   Socioeconomic History  . Marital status: Married    Spouse name: Not on file  . Number of children: Not on file  . Years of education: Not on file  . Highest education level: Not on file  Occupational History  . Not on file  Social Needs  . Financial resource strain: Not on file  . Food insecurity    Worry: Not on file    Inability: Not on file  . Transportation needs    Medical: Not on file    Non-medical: Not on file  Tobacco Use  . Smoking status: Former Smoker    Packs/day: 0.50    Years: 15.00    Pack years: 7.50    Types: Cigarettes    Quit date: 06/12/1995    Years since quitting: 23.7  . Smokeless tobacco: Never Used  Substance and Sexual Activity  . Alcohol use: No    Frequency: Never  . Drug use: No  . Sexual activity: Not on file  Lifestyle  . Physical activity    Days per week: Not on file    Minutes per session: Not on file  . Stress: Not on file  Relationships  . Social Musician on phone: Not on file    Gets together: Not on file    Attends religious service: Not on file    Active member of club or organization: Not on file    Attends meetings of clubs or organizations: Not on file    Relationship status: Not on file  . Intimate partner violence    Fear of current or ex partner: Not on file    Emotionally abused: Not on file    Physically abused: Not on file    Forced sexual activity: Not on file  Other Topics Concern  . Not on file  Social History Narrative  . Not on file     Allergies  Allergen Reactions  . Iodine Itching, Rash and Swelling    IV contrast   . Iodine-131 Itching and Swelling    Pt states causes severe itching Pt states causes severe itching   . Other Shortness Of Breath  . Penicillins Hives  . Pineapple Other (See Comments) and Swelling    Pt states causes her throat swelling Pt states causes her  throat swelling   . Pravastatin     Pt states she couldn't lift her legs to walk  . Shellfish Allergy Rash and Swelling    Had reaction to cardiac cath dye  Had seizure ,rash ,itch Oct. 2012 Had reaction to cardiac cath dye  Had seizure ,rash ,itch Oct. 2012  Seizure during Cardiac Cath 2012  . 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 Azithromycin  INEFFECTIVE  . Hydrocodone-Homatropine   . Levofloxacin   . Shellfish-Derived Products   . Iodinated Diagnostic Agents Rash     Immunization History  Administered Date(s) Administered  . Fluad Quad(high Dose 65+) 02/19/2019  . Influenza, High Dose Seasonal PF 03/15/2016, 03/25/2017, 03/06/2018  . Pneumococcal Conjugate-13 04/16/2014  . Pneumococcal Polysaccharide-23 01/29/2011, 06/28/2015  . Tdap 08/07/2014    Outpatient Medications Prior to Visit  Medication Sig Dispense Refill  . aspirin 81 MG tablet Take 81 mg by mouth daily.    . budesonide (PULMICORT) 0.5 MG/2ML nebulizer solution Take 2 mLs (0.5 mg total) by nebulization 2 (two) times a day. This replaces symbicort 120 mL 6  . carbamazepine (TEGRETOL) 200 MG tablet Take 200 mg by mouth 2 (two) times daily. 1 tab in the morning and 2 at night    . cetirizine (ZYRTEC) 10 MG tablet Take 10 mg by mouth daily.    . cyclobenzaprine (FLEXERIL) 5 MG tablet Take 5 mg by mouth daily.    . diclofenac sodium (VOLTAREN) 1 % GEL Apply to affected area four times daily as needed for pain.    Marland Kitchen dicyclomine (BENTYL) 10 MG capsule TAKE 1 CAPSULE (10 MG TOTAL) BY MOUTH 4 TIMES DAILY BEFORE MEALS AND NIGHTLY. AS NEEDED    . EPINEPHrine (EPIPEN 2-PAK) 0.3 mg/0.3 mL IJ SOAJ injection Inject 0.3 mg into the muscle Once PRN.    Marland Kitchen estradiol (ESTRACE) 1 MG tablet Take 1 mg by mouth daily.  3  . formoterol (PERFOROMIST) 20 MCG/2ML nebulizer solution Take 2 mLs (20 mcg total) by nebulization 2 (two) times daily. This replaces symbicort 120 mL 6  . gabapentin (NEURONTIN) 600  MG tablet Take 600 mg by mouth 2 (two) times daily. 1 in the morning and 2 at bedtime    . levalbuterol (XOPENEX) 0.63 MG/3ML nebulizer solution Take 3 mLs (0.63 mg total) by nebulization every 6 (six) hours as needed for wheezing or shortness of breath. 3 mL 12  . levETIRAcetam (KEPPRA) 1000 MG tablet Take 1,000 mg by mouth 2 (two) times daily. 1 in the morning and 2 at night    . lidocaine (LIDODERM) 5 % APPLY PATCH TO PAINFUL AREA. PATCH MAY REMAIN IN PLACE FOR UP TO 12 HOURS IN A 24 HOUR PERIOD.    Marland Kitchen lisinopril (PRINIVIL,ZESTRIL) 10 MG tablet Take 10 mg by mouth daily.     Marland Kitchen LORazepam (ATIVAN) 1 MG tablet Take 1 mg by mouth 3 (three) times daily.    . meclizine (ANTIVERT) 25 MG tablet Take 25 mg by mouth 2 (two) times daily as needed.     . mirtazapine (REMERON) 30 MG tablet every evening.    . montelukast (SINGULAIR) 10 MG tablet TAKE 1 TABLET BY MOUTH EVERYDAY AT BEDTIME 90 tablet 1  . Multiple Vitamin (MULTIVITAMIN) tablet Take by mouth.    Marland Kitchen omeprazole (PRILOSEC) 40 MG capsule Take 40 mg by mouth 2 (two) times a day.    . ondansetron (ZOFRAN) 4 MG tablet Take 4 mg by mouth.    . OXYGEN Inhale 3 L into the lungs at bedtime.    . Revefenacin (YUPELRI) 175 MCG/3ML SOLN Inhale 3 mLs into the lungs daily. 90 mL 5  . predniSONE (DELTASONE) 10 MG tablet Take 4 tabs po daily x 5 days; 2 tabs daily x5 days; 1 tab daily x 5 days 35 tablet 0  . promethazine-dextromethorphan (PROMETHAZINE-DM) 6.25-15 MG/5ML syrup Take 2.5 mLs by mouth 4 (four) times daily as needed for  cough. (Patient not taking: Reported on 02/06/2019) 118 mL 2   Facility-Administered Medications Prior to Visit  Medication Dose Route Frequency Provider Last Rate Last Dose  . Mepolizumab SOLR 100 mg  100 mg Subcutaneous Q28 days Hetty Blend, FNP   100 mg at 12/23/18 0920  . omalizumab Geoffry Paradise) injection 150 mg  150 mg Subcutaneous Q14 Days Alfonse Spruce, MD   150 mg at 02/10/19 1004    Review of Systems  Constitutional:  Negative for chills and fever.  HENT: Negative for congestion and sore throat.   Eyes: Negative.   Respiratory: Positive for cough, sputum production, shortness of breath and wheezing.   Cardiovascular: Negative for chest pain and leg swelling.  Gastrointestinal: Negative for heartburn, nausea and vomiting.  Skin: Negative for itching and rash.     Objective:   Vitals:   02/19/19 1002  BP: 110/70  Pulse: 85  Temp: (!) 97.5 F (36.4 C)  TempSrc: Temporal  SpO2: 96%  Weight: 158 lb (71.7 kg)  Height: 5\' 8"  (1.727 m)   96% on  RA BMI Readings from Last 3 Encounters:  02/19/19 24.02 kg/m  01/06/19 24.05 kg/m  12/05/18 23.42 kg/m   Wt Readings from Last 3 Encounters:  02/19/19 158 lb (71.7 kg)  01/06/19 158 lb 3.2 oz (71.8 kg)  12/05/18 154 lb (69.9 kg)    Physical Exam Constitutional:      General: She is not in acute distress.    Appearance: Normal appearance. She is not ill-appearing.  HENT:     Head: Normocephalic and atraumatic.     Nose:     Comments: Deferred due to masking requirement.    Mouth/Throat:     Comments: Deferred due to masking requirement. Neck:     Musculoskeletal: Neck supple.  Cardiovascular:     Rate and Rhythm: Normal rate and regular rhythm.  Pulmonary:     Comments: Breathing comfortably on RA (she forgot her home O2). No coughing or wheezing. Abdominal:     General: There is no distension.     Palpations: Abdomen is soft.  Musculoskeletal:        General: No swelling.  Skin:    General: Skin is warm and dry.     Findings: No rash.  Neurological:     General: No focal deficit present.     Mental Status: She is alert.     Gait: Gait normal.  Psychiatric:     Comments: Tearful at times      CBC    Component Value Date/Time   WBC 9.7 09/23/2018 1021   RBC 4.77 09/23/2018 1021   HGB 14.7 09/23/2018 1021   HCT 44.3 09/23/2018 1021   PLT 380 09/23/2018 1021   MCV 93 09/23/2018 1021   MCH 30.8 09/23/2018 1021   MCHC  33.2 09/23/2018 1021   RDW 12.5 09/23/2018 1021   LYMPHSABS 2.7 09/23/2018 1021   EOSABS 0.2 09/23/2018 1021   BASOSABS 0.1 09/23/2018 1021     Chest Imaging- films reviewed: CT chest 06/19/2018- centrilobular emphysema, upper lobe predominant.  Peripheral opacity in the lingula with distal bronchiectasis- suspicious for scar from a previous pneumonia.  Bleb in the lingula.  Mild dependent groundglass opacities in the bases.  No significant adenopathy.  Expiratory films confirm severe dynamic collapse of the trachea and proximal bronchi.  Pulmonary Functions Testing Results: No flowsheet data found.  Office-based biometry from Dr. Ellouise Newer office-flow volume loop with severe obstruction.  FEV1 0.75 L.  Echocardiogram: LVEF 60 to 65% with normal diastolic function.  Normal LA and right a.  Normal RV systolic function and size.  Normal valves.  LHC 01/14/2018: EF 65%, no significant coronary disease.    Assessment & Plan:     ICD-10-CM   1. Stage 4 very severe COPD by GOLD classification (HCC)  J44.9 levalbuterol (XOPENEX HFA) 45 MCG/ACT inhaler  2. Chronic respiratory failure with hypoxia (HCC)  J96.11 levalbuterol (XOPENEX HFA) 45 MCG/ACT inhaler  3. Asthma with COPD (HCC)  J44.9   4. Need for immunization against influenza  Z23 Flu Vaccine QUAD High Dose(Fluad)  5. Anxiety  F41.9    COPD-end-stage.  Asthma overlap features. -Continue malaise lab per allergy -Continue nebulized antimuscarinic, beta agonist, inhaled steroid.  Represcribed Xopenex inhaler for as needed use.  Looking into why she has not yet received her Xopenex nebs. -Continue to complete steroid taper.  We discussed the long-term and short-term side effects of glucocorticoid use, including osteoporosis, insulin resistance, hypertension, skin changes, weight gain.  She understands that she cannot remain on high-dose glucocorticoids indefinitely due to side effects. -Continue aggressive management of allergic rhinitis and  GERD. -Encouraged her to continue regular physical activity -Flu shot today. -Encouraged ongoing COVID precautions, including social distancing and mask wearing. -Discussed the importance of long-term management, especially regarding her anxiety and likely depression with the services of palliative care and hospice.  I strongly recommend establishing care with 1 of these to provide more comprehensive services with frequent follow-up.  I would like for 1 of these physicians to take over prescribing her anxiety medications if she is reluctant to continue to follow with psychiatry given that I think she is going to require frequent titration. -Recommend return to pulmonary rehab when she is able.  I think she would benefit from the educational sessions they provide.  Chronic hypoxic respiratory failure-requires 3 L of oxygen with sleep and 4 L with walking -Continue supplemental oxygen use as needed  Anxiety, likely also depression -Agree with her plan to establish care with a Child psychotherapist -Encouraged her to continue speaking with her pastor on a regular basis -Think she would benefit from palliative care services to address her ongoing and changing needs as her disease progresses.  I have spoken with [facility]'s case manager, and they will be reaching out to her soon to establish care.  80 minutes spent on this visit with greater than half of that time spent in direct face-to-face contact with the patient.  RTC in 2 months.   Current Outpatient Medications:  .  aspirin 81 MG tablet, Take 81 mg by mouth daily., Disp: , Rfl:  .  budesonide (PULMICORT) 0.5 MG/2ML nebulizer solution, Take 2 mLs (0.5 mg total) by nebulization 2 (two) times a day. This replaces symbicort, Disp: 120 mL, Rfl: 6 .  carbamazepine (TEGRETOL) 200 MG tablet, Take 200 mg by mouth 2 (two) times daily. 1 tab in the morning and 2 at night, Disp: , Rfl:  .  cetirizine (ZYRTEC) 10 MG tablet, Take 10 mg by mouth  daily., Disp: , Rfl:  .  cyclobenzaprine (FLEXERIL) 5 MG tablet, Take 5 mg by mouth daily., Disp: , Rfl:  .  diclofenac sodium (VOLTAREN) 1 % GEL, Apply to affected area four times daily as needed for pain., Disp: , Rfl:  .  dicyclomine (BENTYL) 10 MG capsule, TAKE 1 CAPSULE (10 MG TOTAL) BY MOUTH 4 TIMES DAILY BEFORE MEALS AND NIGHTLY. AS NEEDED, Disp: , Rfl:  .  EPINEPHrine (EPIPEN 2-PAK) 0.3 mg/0.3 mL IJ SOAJ injection, Inject 0.3 mg into the muscle Once PRN., Disp: , Rfl:  .  estradiol (ESTRACE) 1 MG tablet, Take 1 mg by mouth daily., Disp: , Rfl: 3 .  formoterol (PERFOROMIST) 20 MCG/2ML nebulizer solution, Take 2 mLs (20 mcg total) by nebulization 2 (two) times daily. This replaces symbicort, Disp: 120 mL, Rfl: 6 .  gabapentin (NEURONTIN) 600 MG tablet, Take 600 mg by mouth 2 (two) times daily. 1 in the morning and 2 at bedtime, Disp: , Rfl:  .  levalbuterol (XOPENEX) 0.63 MG/3ML nebulizer solution, Take 3 mLs (0.63 mg total) by nebulization every 6 (six) hours as needed for wheezing or shortness of breath., Disp: 3 mL, Rfl: 12 .  levETIRAcetam (KEPPRA) 1000 MG tablet, Take 1,000 mg by mouth 2 (two) times daily. 1 in the morning and 2 at night, Disp: , Rfl:  .  lidocaine (LIDODERM) 5 %, APPLY PATCH TO PAINFUL AREA. PATCH MAY REMAIN IN PLACE FOR UP TO 12 HOURS IN A 24 HOUR PERIOD., Disp: , Rfl:  .  lisinopril (PRINIVIL,ZESTRIL) 10 MG tablet, Take 10 mg by mouth daily. , Disp: , Rfl:  .  LORazepam (ATIVAN) 1 MG tablet, Take 1 mg by mouth 3 (three) times daily., Disp: , Rfl:  .  meclizine (ANTIVERT) 25 MG tablet, Take 25 mg by mouth 2 (two) times daily as needed. , Disp: , Rfl:  .  mirtazapine (REMERON) 30 MG tablet, every evening., Disp: , Rfl:  .  montelukast (SINGULAIR) 10 MG tablet, TAKE 1 TABLET BY MOUTH EVERYDAY AT BEDTIME, Disp: 90 tablet, Rfl: 1 .  Multiple Vitamin (MULTIVITAMIN) tablet, Take by mouth., Disp: , Rfl:  .  omeprazole (PRILOSEC) 40 MG capsule, Take 40 mg by mouth 2 (two) times  a day., Disp: , Rfl:  .  ondansetron (ZOFRAN) 4 MG tablet, Take 4 mg by mouth., Disp: , Rfl:  .  OXYGEN, Inhale 3 L into the lungs at bedtime., Disp: , Rfl:  .  Revefenacin (YUPELRI) 175 MCG/3ML SOLN, Inhale 3 mLs into the lungs daily., Disp: 90 mL, Rfl: 5 .  levalbuterol (XOPENEX HFA) 45 MCG/ACT inhaler, Inhale 1 puff into the lungs every 6 (six) hours as needed for wheezing or shortness of breath., Disp: 1 Inhaler, Rfl: 12  Current Facility-Administered Medications:  Marland Kitchen  Mepolizumab SOLR 100 mg, 100 mg, Subcutaneous, Q28 days, Hetty Blend, FNP, 100 mg at 12/23/18 0920 .  omalizumab Geoffry Paradise) injection 150 mg, 150 mg, Subcutaneous, Q14 Days, Alfonse Spruce, MD, 150 mg at 02/10/19 1004   Steffanie Dunn, [facility] 02/19/2019 11:42 AM

## 2019-02-20 ENCOUNTER — Telehealth: Payer: Self-pay | Admitting: Critical Care Medicine

## 2019-02-20 DIAGNOSIS — F419 Anxiety disorder, unspecified: Secondary | ICD-10-CM

## 2019-02-20 NOTE — Telephone Encounter (Signed)
Spoke with Manus Gunning, she states the pt was referred to them for Hospice care but when they went there to assess the patient she refused Hospice and said she wasn't ready. They sent Dr. Elinor Parkinson out there to assess her for palliative care and she refused to sign the papers so he couldn't bill the insurance for the visit. She states the patient is very confused and doesn't want their services at this time. We reached back out to them to contact patient but wanted to let us know what was going on. Will forward to Dr. Carlis Abbott for [facility].

## 2019-02-23 ENCOUNTER — Telehealth: Payer: Self-pay | Admitting: Primary Care

## 2019-02-23 MED ORDER — LEVALBUTEROL HCL 0.63 MG/3ML IN NEBU: 0.6300 mg | INHALATION_SOLUTION | Freq: Four times a day (QID) | RESPIRATORY_TRACT | 12 refills | Status: DC | PRN

## 2019-02-23 NOTE — Telephone Encounter (Signed)
Talked with Dr. Carlis Abbott she states she is not going to manage her anxiety medication, she would like her to see psychiatry or palliative care. Patient states its been so long that her psychiatrist will not see her. She agreed to palliative care. Order placed per Dr. Carlis Abbott for symptom and medication management.   Nothing further needed at this time. Patient also asking about medication looks like it was sent in to correct pharmacy 01/23/19 by BW but only or 66ml ,  and again on 9/10 by Dr. Carlis Abbott but not the nebulizer medication on inhaler. resent in medication with more ml's to get her through the month

## 2019-02-23 NOTE — Telephone Encounter (Signed)
Call returned to patient, she needs a new prescription of xopenex sent to [facility].   According to records, medication was sent today. Call made to [facility], confirmed receipt.   Patient made aware.  Nothing further needed at this time.

## 2019-02-23 NOTE — Telephone Encounter (Signed)
Per Dr. Carlis Abbott >> Can you please let her know that she needs to follow up with Psychiatry to manage her anxiety? Otherwise I would prefer Palliative Care manage it. I think she is going to need more frequent follow up for her anxiety than what is going to be possible from Pulmonology. Thank you!

## 2019-02-23 NOTE — Telephone Encounter (Signed)
Called and spoke with patient. She states she received a letter from [facility] not palliative care. She went through Hospice with her husband and she isn't ready to sign over all her decisions to hospice. Meaning, having to see who they say she has to see.   Patient is currently seeing a Education officer, museum Judson Roch) every 2 weeks to manage her anxiety through Dr. Herbert Deaner office in [place]. The concern is patient is going to run out of medication on 9/21 and she doesn't know who is to manage it. I told patient she may need to call Dr. Christy Gentles office but I would also route this to Dr. Carlis Abbott to let her know. I let patient know Dr. Carlis Abbott would not be back in office until Thursday 02/26/19. Patient was okay with that, since Dr. Carlis Abbott referred patient to Dr. Raechel Chute she wanted Dr. Ainsley Spinner opinion first. Patient very emotional and willing to have palliative care come in but she wants to be able to make her own decisions as long as she is able to.  Routing to Dr. Carlis Abbott to advise on medication management

## 2019-02-24 ENCOUNTER — Other Ambulatory Visit: Payer: Self-pay

## 2019-02-24 ENCOUNTER — Other Ambulatory Visit: Payer: Medicare Other | Admitting: Internal Medicine

## 2019-02-24 ENCOUNTER — Telehealth: Payer: Self-pay | Admitting: Primary Care

## 2019-02-24 ENCOUNTER — Ambulatory Visit: Payer: Self-pay

## 2019-02-24 DIAGNOSIS — J4540 Moderate persistent asthma, uncomplicated: Secondary | ICD-10-CM

## 2019-02-24 NOTE — Telephone Encounter (Signed)
error 

## 2019-02-25 ENCOUNTER — Ambulatory Visit (INDEPENDENT_AMBULATORY_CARE_PROVIDER_SITE_OTHER): Payer: Medicare Other

## 2019-02-25 ENCOUNTER — Telehealth: Payer: Self-pay | Admitting: Primary Care

## 2019-02-25 ENCOUNTER — Other Ambulatory Visit: Payer: Self-pay

## 2019-02-25 DIAGNOSIS — J4540 Moderate persistent asthma, uncomplicated: Secondary | ICD-10-CM | POA: Diagnosis not present

## 2019-02-25 NOTE — Telephone Encounter (Signed)
Spoke with [employer]. She verbalized understanding. She stated that they can provide palliative care services to patient. She also stated that she would contact patient's PCP in regards to the increased anxiety.   She would like for [place] to reach out to the patient and advise her of the switch from [place] to [place].   Called patient. She stated that the Perforomist is working well now. She had a few rough days at the beginning but she is able to use the medication now. She does not want to switch to [facility].   Advised her to call us back if she needed anything, she verbalized understanding.   Nothing further needed at time of call.

## 2019-02-25 NOTE — Telephone Encounter (Signed)
Message routed to Anne Barrow, NP:  Eustaquio Rich, this patient was seen by you on 02/06/19 and by Dr. Carlis Abbott on 02/19/19.  I called back Anne Rich with [facility]. She requested you call her back regarding the following:  1) They need clarification as to which agency will provide care for the patient. A referral was sent to her agency and to Anne Rich. She had already been in communication twice with the patient before she saw her yesterday.  2) Wants to know if the patient can be given Vistaril 25 mg for anxiety twice a day? The patient currently wants to increase her Ativan (Lorazepam) 1 mg up from twice daily to three times a day (3 times a day is on the prescription). Can she be changed to Vistaril instead?  3) Patient stated Perforomist decreases her memory. She was on Perforomist in the past, had the same problem and was taken off of it. Now she is back on the medication and the problem has returned. Needs to know if there is an alternative?  4) Confirming the patient is no longer using Trelegy based on her office visit with Dr. Carlis Abbott.  Anne Rich stated she has filled out a MOST form for the patient. The patient told her that she is not ready for hospice care, but feels that if they can get her anxiety under control and work with the patient, the patient may become more willing to consider it.   Anne Rich requested a call back with your recommendations in response to the above. She stated that you can leave a detailed message if you get her voicemail.

## 2019-02-25 NOTE — Telephone Encounter (Signed)
As long as her agency can provide palliative care services (to help address symptom management/goals of care) we can go ahead and cancel [facility].   We are not managing her anxiety, recommend they contact psychiatry or primary care regarding Vistril.   She is not on Trelegy. We can try changing Perforomist to Brovana twice daily.

## 2019-03-09 DIAGNOSIS — J4540 Moderate persistent asthma, uncomplicated: Secondary | ICD-10-CM | POA: Diagnosis not present

## 2019-03-10 ENCOUNTER — Ambulatory Visit (INDEPENDENT_AMBULATORY_CARE_PROVIDER_SITE_OTHER): Payer: Medicare Other

## 2019-03-10 ENCOUNTER — Other Ambulatory Visit: Payer: Self-pay

## 2019-03-10 DIAGNOSIS — J4540 Moderate persistent asthma, uncomplicated: Secondary | ICD-10-CM | POA: Diagnosis not present

## 2019-03-11 ENCOUNTER — Telehealth: Payer: Self-pay | Admitting: Primary Care

## 2019-03-11 DIAGNOSIS — J9611 Chronic respiratory failure with hypoxia: Secondary | ICD-10-CM

## 2019-03-11 NOTE — Telephone Encounter (Signed)
Called and spoke with pt. Pt is inquiring about two things:  - She states she needs a prescription for O2 because her DME company, [employer], is going to be picking up her O2 soon. (Pt is on O2 4L with exertion and 3L qhs) She would like to be established with [facility]; however, she would prefer to be established with a DME that delivers.  - Pt is inquiring that once her O2 is picked up, if her portable Inogen tank would be enough to sustain her oxygen levels at night.   I let her know I would first route a message to Dr. Carlis Abbott, her newly established pulmonologist, about the 2nd question. I let her know I would also pend an order for new DME/O2 start depending on what Dr. Ainsley Spinner recommendations are. Pt expressed understanding.   Dr. Carlis Abbott, please advise with your recommendations for this pt. Thank you.

## 2019-03-12 NOTE — Telephone Encounter (Signed)
Called and spoke w/ pt regarding Dr. Ainsley Spinner response/recommendations. Pt verbalized understanding with no additional questions. Order has been placed for new DME, new O2 start. Nothing further needed at this time.

## 2019-03-16 ENCOUNTER — Ambulatory Visit (INDEPENDENT_AMBULATORY_CARE_PROVIDER_SITE_OTHER): Payer: Medicare Other | Admitting: Allergy & Immunology

## 2019-03-16 ENCOUNTER — Encounter: Payer: Self-pay | Admitting: Allergy & Immunology

## 2019-03-16 ENCOUNTER — Other Ambulatory Visit: Payer: Self-pay

## 2019-03-16 VITALS — BP 142/82 | HR 84 | Temp 96.0°F | Resp 18 | Ht 68.0 in | Wt 156.8 lb

## 2019-03-16 DIAGNOSIS — J4550 Severe persistent asthma, uncomplicated: Secondary | ICD-10-CM | POA: Diagnosis not present

## 2019-03-16 DIAGNOSIS — J449 Chronic obstructive pulmonary disease, unspecified: Secondary | ICD-10-CM | POA: Diagnosis not present

## 2019-03-16 DIAGNOSIS — K219 Gastro-esophageal reflux disease without esophagitis: Secondary | ICD-10-CM | POA: Diagnosis not present

## 2019-03-16 DIAGNOSIS — J3089 Other allergic rhinitis: Secondary | ICD-10-CM

## 2019-03-16 DIAGNOSIS — Z9981 Dependence on supplemental oxygen: Secondary | ICD-10-CM | POA: Diagnosis not present

## 2019-03-16 MED ORDER — ALBUTEROL SULFATE HFA 108 (90 BASE) MCG/ACT IN AERS: 2.0000 | INHALATION_SPRAY | RESPIRATORY_TRACT | 5 refills | Status: DC | PRN

## 2019-03-16 NOTE — Progress Notes (Signed)
[address] [place]  [address] Dept: [phone]  FOLLOW UP NOTE  Patient ID: Anne Rich, female    DOB: March 27, 1951  Age: 68 y.o. MRN: [id number] Date of Office Visit: 03/16/2019  Assessment  Chief Complaint: Asthma (fine, except when it's raining or the himidity is high)  HPI Illene Rich is a 68 year old female who presents to the clinic for a follow up visit. She was last seen in the clinic on 12/25/2018 by Dr. Dellis Anes for evaluation of asthma with COPD overlap, allergic rhinitis, and reflux.  At today's visit she reports that she is having some shortness of breath and wheezing with activity.  She reports that she walks 2 miles in the mall with her friends and needs to take a break after 1 mile to catch her breath.  She denies cough.  She continues budesonide 0.5 mg twice a day, Yupelri once a day, and Perforomist once a day.  She reports if she uses Perforomist twice a day it causes her to be in a " mental fog".  She reports using either albuterol or Xopenex via inhaler 2 times a day and using Xopenex via inhaler 1 time overnight.  She reports that she gets better symptom relief from shortness of breath with using albuterol via inhaler rather than Xopenex via inhaler.  She continues to receive Xolair 150 mg injections once every 2 weeks.  She reports using 3 L nasal cannula while walking and while sleeping overnight.  Allergic rhinitis is reported as well controlled with cetirizine and Mucinex as needed.  Reflux is reported as well controlled with 40 mg of omeprazole twice a day in addition to dietary and lifestyle modifications.  Her current medications are listed in the chart.   Drug Allergies:  Allergies  Allergen Reactions  . Iodine Itching, Rash and Swelling    IV contrast   . Iodine-131 Itching and Swelling    Pt states causes severe itching Pt states causes severe itching   . Other Shortness Of Breath  . Penicillins Hives  . Pineapple Other (See Comments) and  Swelling    Pt states causes her throat swelling Pt states causes her throat swelling   . Pravastatin     Pt states she couldn't lift her legs to walk  . Shellfish Allergy Rash and Swelling    Had reaction to cardiac cath dye  Had seizure ,rash ,itch Oct. 2012 Had reaction to cardiac cath dye  Had seizure ,rash ,itch Oct. 2012  Seizure during Cardiac Cath 2012  . 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 Azithromycin      INEFFECTIVE  . Hydrocodone-Homatropine   . Levofloxacin   . Shellfish-Derived Products   . Iodinated Diagnostic Agents Rash    Physical Exam: BP (!) 142/82 (BP Location: Left Arm, Patient Position: Sitting, Cuff Size: Normal)   Pulse 84   Temp (!) 96 F (35.6 C) (Temporal)   Resp 18   Ht 5\' 8"  (1.727 m)   Wt 156 lb 12.8 oz (71.1 kg)   SpO2 95%   BMI 23.84 kg/m    Physical Exam Vitals signs reviewed.  Constitutional:      Appearance: Normal appearance.  HENT:     Head: Normocephalic and atraumatic.     Right Ear: Tympanic membrane normal.     Left Ear: Tympanic membrane normal.     Nose:     Comments: Bilateral nares slightly erythematous and edematous with  clear nasal drainage.  Pharynx normal.  Ears normal.  Eyes normal.    Mouth/Throat:     Pharynx: Oropharynx is clear.  Eyes:     Conjunctiva/sclera: Conjunctivae normal.  Neck:     Musculoskeletal: Normal range of motion and neck supple.  Cardiovascular:     Rate and Rhythm: Normal rate and regular rhythm.     Heart sounds: No murmur.  Pulmonary:     Effort: Pulmonary effort is normal.     Breath sounds: Normal breath sounds.     Comments: Lungs clear to auscultation. Musculoskeletal: Normal range of motion.  Skin:    General: Skin is warm and dry.  Neurological:     Mental Status: She is alert and oriented to person, place, and time.  Psychiatric:        Mood and Affect: Mood normal.        Behavior: Behavior normal.        Thought Content: Thought content  normal.        Judgment: Judgment normal.     Diagnostics: FVC 1.58, FEV1 0.60.  Predicted FVC 3.55, predicted FEV1 2.70.  Spirometry indicates severe restriction and severe airway obstruction.  This is consistent with previous spirometry readings.  Assessment and Plan: 1. Asthma-COPD overlap syndrome (Gantt)   2. Oxygen dependent   3. Perennial allergic rhinitis   4. Gastroesophageal reflux disease, unspecified whether esophagitis present     Meds ordered this encounter  Medications  . albuterol (VENTOLIN HFA) 108 (90 Base) MCG/ACT inhaler    Sig: Inhale 2 puffs into the lungs every 4 (four) hours as needed for wheezing or shortness of breath.    Dispense:  18 g    Refill:  5    Patient Instructions  1. Moderate persistent asthma with COPD overlap - We will continue with Xolair 150 mg injections once every 2 weeks - Continue with your O2 at 2L up to 4L - Daily controller medication(s): Budesonide 0.5 twice a day + Perforomist 20 mg twice a day + Yupelri once daily via nebulizer - Rescue medications: Xopenex (levalbuterol) + Atrovent (ipratropium) mixed together every 6 hours as needed via nebulizer or instead albuterol 2 puffs every 4 hours as needed for cough or wheeze - Asthma control goals:  * Full participation in all desired activities (may need albuterol before activity) * Albuterol use two time or less a week on average (not counting use with activity) * Cough interfering with sleep two time or less a month * Oral steroids no more than once a year * No hospitalizations  2. Allergic rhinitis - did allergy shots for three years - Continue with montelukast 10mg  daily. - Continue cetirizine once a day as needed for a runny nose or sneezing For thick post nasal drainage, begin Mucinex [phone] mg twice a day  3. Reflux - Continue omeprazole twice a day as previously prescribed - Continue dietary and lifestyle modifications as you have been  4. Follow up in 4 months or  sooner if needed   Return in about 4 months (around 07/17/2019), or if symptoms worsen or fail to improve.    Thank you for the opportunity to care for this patient.  Please do not hesitate to contact me with questions.  Gareth Morgan, FNP [facility]

## 2019-03-16 NOTE — Patient Instructions (Addendum)
1. Moderate persistent asthma with COPD overlap - We will continue with Xolair 150 mg injections once every 2 weeks - Continue with your O2 at 2L up to 4L - Daily controller medication(s): Budesonide 0.5 twice a day + Perforomist 20 mg twice a day + Yupelri once daily via nebulizer - Rescue medications: Xopenex (levalbuterol) + Atrovent (ipratropium) mixed together every 6 hours as needed via nebulizer or instead albuterol 2 puffs every 4 hours as needed for cough or wheeze - Asthma control goals:  * Full participation in all desired activities (may need albuterol before activity) * Albuterol use two time or less a week on average (not counting use with activity) * Cough interfering with sleep two time or less a month * Oral steroids no more than once a year * No hospitalizations  2. Allergic rhinitis - did allergy shots for three years - Continue with montelukast 10mg  daily. - Continue cetirizine once a day as needed for a runny nose or sneezing For thick post nasal drainage, begin Mucinex [phone] mg twice a day  3. Reflux - Continue omeprazole twice a day as previously prescribed - Continue dietary and lifestyle modifications as you have been  4. Follow up in 4 months or sooner if needed   Lifestyle Changes for Controlling GERD When you have GERD, stomach acid feels as if it's backing up toward your mouth. Whether or not you take medication to control your GERD, your symptoms can often be improved with lifestyle changes.   Raise Your Head  Reflux is more likely to strike when you're lying down flat, because stomach fluid can  flow backward more easily. Raising the head of your bed 4-6 inches can help. To do this:  Slide blocks or books under the legs at the head of your bed. Or, place a wedge under  the mattress. Many foam stores can make a suitable wedge for you. The wedge  should run from your waist to the top of your head.  Don't just prop your head on several pillows.  This increases pressure on your  stomach. It can make GERD worse.  Watch Your Eating Habits Certain foods may increase the acid in your stomach or relax the lower esophageal sphincter, making GERD more likely. It's best to avoid the following:  Coffee, tea, and carbonated drinks (with and without caffeine)  Fatty, fried, or spicy food  Mint, chocolate, onions, and tomatoes  Any other foods that seem to irritate your stomach or cause you pain  Relieve the Pressure  Eat smaller meals, even if you have to eat more often.  Don't lie down right after you eat. Wait a few hours for your stomach to empty.  Avoid tight belts and tight-fitting clothes.  Lose excess weight.  Tobacco and Alcohol  Avoid smoking tobacco and drinking alcohol. They can make GERD symptoms worse.    3. No follow-ups on file.   Please inform us of any Emergency Department visits, hospitalizations, or changes in symptoms. Call us before going to the ED for breathing or allergy symptoms since we might be able to fit you in for a sick visit. Feel free to contact us anytime with any questions, problems, or concerns.  It was a pleasure to talk to you today!  today!  Websites that have reliable patient information: 1. American Academy of Asthma, Allergy, and Immunology: www.aaaai.org 2. Food Allergy Research and Education (FARE): foodallergy.org 3. Mothers of Asthmatics: http://www.asthmacommunitynetwork.org 4. American College of Allergy, Asthma, and Immunology: www.acaai.org  "  Like" [place] on Facebook and Instagram for our latest updates!      Make sure you are registered to vote! If you have moved or changed any of your contact information, you will need to get this updated before voting!    Voter ID laws are NOT going into effect for the General Election in November 2020! DO NOT let this stop you from exercising your right to vote!

## 2019-03-17 ENCOUNTER — Telehealth: Payer: Self-pay | Admitting: Primary Care

## 2019-03-17 NOTE — Telephone Encounter (Signed)
Spoke to Anne Rich at [employer] she is going to check on it

## 2019-03-17 NOTE — Telephone Encounter (Signed)
Spoke with the pt  She is requesting status update on o2 order that was sent to [facility]  Forwarding to [facility]  Please advise and update pt thanks

## 2019-03-18 NOTE — Telephone Encounter (Signed)
Called and spoke w/ pt regarding the information below. Pt expressed understanding and states she is having [employer] pick up her O2 this Friday 03/20/2019. I inquired if she had O2 available to her, which she replied that she has an Inogen tank she bought herself she plans to use until her contract with [employer] is over and [facility] can proceed with setting her up with O2. I let her know to give our office a call back if she doesn't hear from [facility] by 10/22. Pt verbalized understanding with no additional questions. Nothing further needed at this time.

## 2019-03-18 NOTE — Telephone Encounter (Signed)
Per Caryl Pina @ [employer] they are unable to do anything until 10/22 then her contract will be up with [employer] then they can take her

## 2019-03-20 ENCOUNTER — Telehealth: Payer: Self-pay | Admitting: Primary Care

## 2019-03-20 NOTE — Telephone Encounter (Signed)
Left message for patient to call back  

## 2019-03-23 ENCOUNTER — Other Ambulatory Visit: Payer: Self-pay

## 2019-03-23 ENCOUNTER — Ambulatory Visit (INDEPENDENT_AMBULATORY_CARE_PROVIDER_SITE_OTHER): Payer: Medicare Other | Admitting: Primary Care

## 2019-03-23 ENCOUNTER — Telehealth: Payer: Self-pay | Admitting: *Deleted

## 2019-03-23 ENCOUNTER — Encounter: Payer: Self-pay | Admitting: Primary Care

## 2019-03-23 DIAGNOSIS — J441 Chronic obstructive pulmonary disease with (acute) exacerbation: Secondary | ICD-10-CM

## 2019-03-23 MED ORDER — DOXYCYCLINE HYCLATE 100 MG PO TABS: 100.0000 mg | ORAL_TABLET | Freq: Two times a day (BID) | ORAL | 0 refills | Status: DC

## 2019-03-23 MED ORDER — PREDNISONE 10 MG PO TABS: ORAL_TABLET | ORAL | 0 refills | Status: DC

## 2019-03-23 NOTE — Telephone Encounter (Signed)
LMTCB

## 2019-03-23 NOTE — Telephone Encounter (Signed)
Pt returning call - she can be reach [phone] or [phone]

## 2019-03-23 NOTE — Telephone Encounter (Signed)
Unfortunately there is nothing on this end that I can do. Due to her insurance another company can't pick up doing her oxygen until 04/02/19 I called and spoke to [place] at  [employer] she is going to call the patient again and explain it to her. She is borrowing another person's Inogene tank and is worried they will need it back.

## 2019-03-23 NOTE — Telephone Encounter (Signed)
Spoke with the pt  She is c/o increased DOE since 03/20/19  She also c/o prod cough with yellow sputum, wheezing, chest tightness  Televisit with Beth at 2:15 today   She is upset that [employer] has not reached out to her  She wants an update on this  Will forward to [facility] for update Please advise thanks

## 2019-03-23 NOTE — Telephone Encounter (Signed)
LMTCB x2 for pt 

## 2019-03-23 NOTE — Telephone Encounter (Addendum)
Patient called office stating she had called Pulmonary Friday wanting Prednisone.  Patient also wanted to confirm who Dr. Ernst Bowler wanted her to see next if Dr. Lake Bells would not be seeing her.  Patient stated she was waiting on [employer] to contact her regarding her oxygen and that referral was put in by another practice and provider.  Informed patient that nurse from pulmonary had noted that they had called her twice and left message.  Patient states she does not use voicemail.  Advised patient to call the office back since they had been trying to return her call.  Informed patient she had appointment with Geraldo Pitter, NP at [facility] on 04/09/2019.

## 2019-03-23 NOTE — Telephone Encounter (Signed)
Pt calling back - she can be reached at [phone], the alternative number she gave initially was incorrect - the alternate  Number should be [phone] -pr

## 2019-03-23 NOTE — Patient Instructions (Addendum)
Treating you for COPD exacerbation  Rx: Doxycycline 1 tab twice daily x 7 days Prednisone taper as directed (40mg  x 2 days; 30mg  x 2 days; 20mg  x 2 days; 10mg  x 2 days)  Recommendations: Continue Pulmicort/Perforomist twice daily Continue Yupelri once daily Use xopenex nebulizer every 4-6 hours for breakthrough shortness of breath/wheezing Mucinex twice daily for chest congestion  Follow up: 10/29 at 9:30

## 2019-03-23 NOTE — Progress Notes (Signed)
Virtual Visit via Telephone Note  I connected with Anne Rich on 03/23/19 at  2:15 PM EDT by telephone and verified that I am speaking with the correct person using two identifiers.  Location: Patient: Home (walking to car to go to another doctors apt) Provider: Office   I discussed the limitations, risks, security and privacy concerns of performing an evaluation and management service by telephone and the availability of in person appointments. I also discussed with the patient that there may be a patient responsible charge related to this service. The patient expressed understanding and agreed to proceed.   History of Present Illness: 68 year old female, former smoker quit 1997 (7.5 pack year hx). PMH significant for end-stage COPD, chronic respiratory failure with hypoxia, perennial allergic rhinitis. Former patient of Dr. Lake Bells, established care with Dr. Carlis Abbott seen on 02/19/19. Maintained on Pulmicort/Perforomist twice daily and Yuperlri once daily. Albuterol as needed. On chronic oxygen 3-4L with exertion. On Xolair prescribed by her Allergist Dr. Ernst Bowler. Recommended returning to pulmonary rehab when able. Psychiatry to manage patient anxiety medication.   03/23/2019 Patient contacted today for acute teleivist, complains of increased shortness of breath x 3 days. Associated productive cough with yellow mucus. States that her symptoms have worsened since the large amounts of rain we've had. She is taking all nebulizer's as prescribed. She wants to change DME companies for her oxygen, referral placed with [facility]. Denies fever, sick contact, GI symptoms, change smell/taste.   Observations/Objective:  - Mild-moderate dyspnea on exertion - No cough or wheezing noted  Assessment and Plan:  COPD exacerbation - Increased shortness of breath with productive cough x 3 days - RX doxycycline 1 tab twice daily x 7 days; prednisone taper as directed  - Continue Pulmicort/Perforomist twice  daily; Yupelri once daily - Xopenex nebulizer q6 hours prn sob/wheezing   Chronic respiratory failure with hypoxia - Continues supplemental oxygen 3-4L to maintain O2 >90% - Referral place to change oxygen company to [employer], previous contract up on October 22nd   Follow Up Instructions:   - Has apt scheduled in 2 weeks with NP  I discussed the assessment and treatment plan with the patient. The patient was provided an opportunity to ask questions and all were answered. The patient agreed with the plan and demonstrated an understanding of the instructions.   The patient was advised to call back or seek an in-person evaluation if the symptoms worsen or if the condition fails to improve as anticipated.  I provided 15 minutes of non-face-to-face time during this encounter.   Martyn Ehrich, NP

## 2019-03-24 ENCOUNTER — Ambulatory Visit: Payer: Self-pay

## 2019-03-24 NOTE — Telephone Encounter (Signed)
Called and spoke to pt. Pt states [employer] has already picked up her O2. Pt states she is still using her friends O2. Pt is aware that [facility] is able to pick her up on 10/22 and is also aware to contact our office if she at any times goes without O2 prior to 10/22. Nothing further needed at this time.

## 2019-03-24 NOTE — Telephone Encounter (Signed)
Apparently Dr. Lake Bells is moving to all inpatient.  I told her I would recommend that she see Dr. Vaughan Browner instead.  I would assume that she could just call and make an appointment since she is with the practice anyway.  Salvatore Marvel, MD [facility] [place]

## 2019-03-24 NOTE — Telephone Encounter (Signed)
Called and spoke with patient.  Patient was seen by Geraldo Pitter, NP for televisit yesterday and now has visit on 04/09/2019.  Patient given name of provider per Dr. Ernst Bowler for her to see since Dr. Lake Bells is moving to all inpatient.  Patient did not get Xoliar injection today in [place] since she is now on antibiotic and Prednisone.  She will call back later to reschedule.

## 2019-04-01 DIAGNOSIS — J4550 Severe persistent asthma, uncomplicated: Secondary | ICD-10-CM | POA: Diagnosis not present

## 2019-04-02 ENCOUNTER — Ambulatory Visit (INDEPENDENT_AMBULATORY_CARE_PROVIDER_SITE_OTHER): Payer: Medicare Other

## 2019-04-02 DIAGNOSIS — J4550 Severe persistent asthma, uncomplicated: Secondary | ICD-10-CM | POA: Diagnosis not present

## 2019-04-03 ENCOUNTER — Other Ambulatory Visit: Payer: Self-pay

## 2019-04-03 ENCOUNTER — Telehealth: Payer: Self-pay | Admitting: Critical Care Medicine

## 2019-04-03 ENCOUNTER — Telehealth: Payer: Self-pay

## 2019-04-03 MED ORDER — PREDNISONE 10 MG PO TABS: ORAL_TABLET | ORAL | 0 refills | Status: DC

## 2019-04-03 NOTE — Telephone Encounter (Signed)
Please call back patient.   When is the last time she had prednisone? According to the chart, she had prednisone and doxycycline about 10 days ago.  Did that clear up her last copd exacerbation?  I can send in another course of prednisone but I don't think she needs antibiotics.  Make sure she is using all her inhalers and nebulizers.   Daily controller medication(s): Budesonide 0.5 twice a day + Perforomist 20 mg twice a day + Yupelri once daily via nebulizer - Rescue medications: Xopenex (levalbuterol) + Atrovent (ipratropium) mixed together every 6 hours as needed via nebulizer or instead albuterol 2 puffs every 4 hours as needed for cough or wheeze

## 2019-04-03 NOTE — Telephone Encounter (Signed)
Requested OV notes faxed to [phone]. Confirmation received.    Patient aware OV notes have been faxed.

## 2019-04-03 NOTE — Telephone Encounter (Signed)
Lm for pt to call us back  

## 2019-04-03 NOTE — Telephone Encounter (Signed)
Pt. Calling. Had her xolair injection yesterday. Couldn't go home until 3 bc she had some people who were working in her apartment so she went to a park for a couple of hours. Pt. Doesn't really go out since the covid other than going to her Dr. Thomasene Lot. Last night she started coughing with wheezing along with her chest feeling tight. Pt. Did do her breathing treatments last night. This morning she took a mucinex which seemed to help with the drainage, she says her chest still feels tight. She states she usually gets bronchitis this time a year and she doesn't want to get it. She also refilled her cough medicine that Dr. Ernst Bowler gave her last year. Please advise

## 2019-04-03 NOTE — Telephone Encounter (Signed)
Patient can't take nasal sprays due to h/o ? Nasal cancer.

## 2019-04-03 NOTE — Telephone Encounter (Signed)
After conversation with Anne Rich, [facility] called pt and they said that pt had to get Dr. Anastasia Pall office to fax over EXACTLY what she needed along with OV notes. [employer] FAX: [phone]

## 2019-04-03 NOTE — Telephone Encounter (Signed)
Pt. Calling back telling us she really feels its more allergy related. She states her eyes are burning and her nose is stopped up. Her chest tightness feels much better since she has taken mucinex this morning Since pt. Just finished a course of prednisone on Monday for copd exacerbation, but now being more allergy. Dr. Maudie Mercury is giving pt. Just 10 mg of prednisone for 5 days. Will send it into pt's pharmacy. Pt. Aware.

## 2019-04-03 NOTE — Telephone Encounter (Signed)
Called and spoke to patient. Patient is concerned about not yet receiving her oxygen.  Patient stated that she thought she would be getting her oxygen on 04/02/2019 from [place].  Called and spoke to an [place] at [employer]. They are working on processing the order since patient has just become eligible to change companies. They will try to process the paperwork and get the oxygen out to the patient today.  Called the patient back and let her know that they should be contacting her sometime today to arrange delivery.   Will leave encounter open till we can confirm that patient has received oxygen.

## 2019-04-06 NOTE — Telephone Encounter (Signed)
Spoke with pt, she states she hasnt received her oxygen yet but has set up but has spoken to [employer] so she should be receiving the oxygen soon. Nothing further is needed. I advised pt to let us know if they need anything else.

## 2019-04-09 ENCOUNTER — Other Ambulatory Visit: Payer: Self-pay

## 2019-04-09 ENCOUNTER — Encounter: Payer: Self-pay | Admitting: Primary Care

## 2019-04-09 ENCOUNTER — Ambulatory Visit (INDEPENDENT_AMBULATORY_CARE_PROVIDER_SITE_OTHER): Payer: Medicare Other | Admitting: Primary Care

## 2019-04-09 DIAGNOSIS — J449 Chronic obstructive pulmonary disease, unspecified: Secondary | ICD-10-CM

## 2019-04-09 DIAGNOSIS — J9611 Chronic respiratory failure with hypoxia: Secondary | ICD-10-CM

## 2019-04-09 MED ORDER — DALIRESP 250 MCG PO TABS: 1.0000 | ORAL_TABLET | Freq: Every day | ORAL | 0 refills | Status: DC

## 2019-04-09 MED ORDER — PREDNISONE 10 MG PO TABS: ORAL_TABLET | ORAL | 0 refills | Status: DC

## 2019-04-09 NOTE — Assessment & Plan Note (Addendum)
-   O2 89% on RA today - She appears resistant to using her oxygen but brought her POC in with her today - Continue 2-4L on exertion to keep O2 > 88-90%

## 2019-04-09 NOTE — Progress Notes (Signed)
@Patient  ID: , female    DOB: 11-16-1950, 68 y.o.   MRN: [id number]  Chief Complaint  Patient presents with  . Follow-up    Referring provider: [id number], MD  HPI: 68 year old female, former smoker quit 1997 (7.5 pack year hx). PMH significant for end-stage COPD, chronic respiratory failure with hypoxia, perennial allergic rhinitis. Former patient of Dr. 78, established care with Dr. [name] seen on 02/19/19. Maintained on Pulmicort/Perforomist twice daily and Yuperlri once daily. Albuterol as needed. On chronic oxygen 3-4L with exertion. On Xolair prescribed by her Allergist Dr. 04/21/19. Recommended returning to pulmonary rehab when able. Psychiatry to manage patient anxiety medication.   Previous [facility] pulmonary encounter: 03/23/2019 Patient contacted today for acute teleivist, complains of increased shortness of breath x 3 days. Associated productive cough with yellow mucus. States that her symptoms have worsened since the large amounts of rain we've had. She is taking all nebulizer's as prescribed. She wants to change DME companies for her oxygen. Denies fever, sick contact, GI symptoms, change smell/taste. Treated for COPD exacerbation with doxycycline and prednisone taper. Referral placed to change oxygen company to 05/23/2019, previous contract up on October 22.   04/09/2019 Patient presents today for regular follow-up. She is at her baseline. Continues to report symptoms of moderate dyspnea. States that she is not coughing as much, she does occasionally get up some light green mucus. She feels very strongly that she needs to be on daily prednisone. States that 10-20mg  just does not cut it. She is taking zyrtec and Singulair daily. States that she is not able to use medicated nasal sprays d/t history of previous surgery. Taking all her inhalers as prescribed, she does not notice improvement with any of them except for her Albuterol. She does not like wearing her oxygen. She  brought her POC with her today but was not wearing it. O2 98% RA after walking back to exam room. Discussed adding Daliresp to therapy to decrease frequency of COPD exacerbations. She is worried about GI side effects with daliresp d/t IBS.    Testing Results: Pulmonary function testing: Office-based biometry from Dr. 04/11/2019 office-flow volume loop with severe obstruction.  FEV1 0.75 L.  Echocardiogram: LVEF 60 to 65% with normal diastolic function.  Normal LA and right a.  Normal RV systolic function and size.  Normal valves.  LHC 01/14/2018: EF 65%, no significant coronary disease.  Allergies  Allergen Reactions  . Iodine Itching, Rash and Swelling    IV contrast   . Iodine-131 Itching and Swelling    Pt states causes severe itching Pt states causes severe itching   . Other Shortness Of Breath  . Penicillins Hives  . Pineapple Other (See Comments) and Swelling    Pt states causes her throat swelling Pt states causes her throat swelling   . Pravastatin     Pt states she couldn't lift her legs to walk  . Shellfish Allergy Rash and Swelling    Had reaction to cardiac cath dye  Had seizure ,rash ,itch Oct. 2012 Had reaction to cardiac cath dye  Had seizure ,rash ,itch Oct. 2012  Seizure during Cardiac Cath 2012  . 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 Azithromycin      INEFFECTIVE  . Hydrocodone-Homatropine   . Levofloxacin   . Shellfish-Derived Products   . Iodinated Diagnostic Agents Rash    Immunization History  Administered Date(s) Administered  . Fluad Quad(high Dose 65+)  02/19/2019  . Influenza, High Dose Seasonal PF 03/15/2016, 03/25/2017, 03/06/2018  . Pneumococcal Conjugate-13 04/16/2014  . Pneumococcal Polysaccharide-23 01/29/2011, 06/28/2015  . Tdap 08/07/2014    Past Medical History:  Diagnosis Date  . Asthma     Tobacco History: Social History   Tobacco Use  Smoking Status Former Smoker  . Packs/day: 0.50  .  Years: 15.00  . Pack years: 7.50  . Types: Cigarettes  . Quit date: 06/12/1995  . Years since quitting: 23.8  Smokeless Tobacco Never Used   Counseling given: Not Answered   Outpatient Medications Prior to Visit  Medication Sig Dispense Refill  . albuterol (VENTOLIN HFA) 108 (90 Base) MCG/ACT inhaler Inhale 2 puffs into the lungs every 4 (four) hours as needed for wheezing or shortness of breath. 18 g 5  . aspirin 81 MG tablet Take 81 mg by mouth daily.    . budesonide (PULMICORT) 0.5 MG/2ML nebulizer solution Take 2 mLs (0.5 mg total) by nebulization 2 (two) times a day. This replaces symbicort 120 mL 6  . carbamazepine (TEGRETOL) 200 MG tablet Take 200 mg by mouth 2 (two) times daily. 1 tab in the morning and 2 at night    . cetirizine (ZYRTEC) 10 MG tablet Take 10 mg by mouth daily.    . cyclobenzaprine (FLEXERIL) 5 MG tablet Take 5 mg by mouth daily.    . diclofenac sodium (VOLTAREN) 1 % GEL Apply to affected area four times daily as needed for pain.    Marland Kitchen. dicyclomine (BENTYL) 10 MG capsule TAKE 1 CAPSULE (10 MG TOTAL) BY MOUTH 4 TIMES DAILY BEFORE MEALS AND NIGHTLY. AS NEEDED    . EPINEPHrine (EPIPEN 2-PAK) 0.3 mg/0.3 mL IJ SOAJ injection Inject 0.3 mg into the muscle Once PRN.    Marland Kitchen. estradiol (ESTRACE) 1 MG tablet Take 1 mg by mouth daily.  3  . formoterol (PERFOROMIST) 20 MCG/2ML nebulizer solution Take 2 mLs (20 mcg total) by nebulization 2 (two) times daily. This replaces symbicort 120 mL 6  . gabapentin (NEURONTIN) 600 MG tablet Take 600 mg by mouth 2 (two) times daily. 1 in the morning and 2 at bedtime    . levalbuterol (XOPENEX HFA) 45 MCG/ACT inhaler Inhale 1 puff into the lungs every 6 (six) hours as needed for wheezing or shortness of breath. 1 Inhaler 12  . levalbuterol (XOPENEX) 0.63 MG/3ML nebulizer solution Take 3 mLs (0.63 mg total) by nebulization every 6 (six) hours as needed for wheezing or shortness of breath. 360 mL 12  . levETIRAcetam (KEPPRA) 1000 MG tablet Take 1,000  mg by mouth 2 (two) times daily. 1 in the morning and 2 at night    . lidocaine (LIDODERM) 5 % APPLY PATCH TO PAINFUL AREA. PATCH MAY REMAIN IN PLACE FOR UP TO 12 HOURS IN A 24 HOUR PERIOD.    Marland Kitchen. lisinopril (PRINIVIL,ZESTRIL) 10 MG tablet Take 10 mg by mouth daily.     Marland Kitchen. LORazepam (ATIVAN) 1 MG tablet Take 1 mg by mouth 3 (three) times daily.    . meclizine (ANTIVERT) 25 MG tablet Take 25 mg by mouth 2 (two) times daily as needed.     . mirtazapine (REMERON) 30 MG tablet every evening.    . montelukast (SINGULAIR) 10 MG tablet TAKE 1 TABLET BY MOUTH EVERYDAY AT BEDTIME 90 tablet 1  . Multiple Vitamin (MULTIVITAMIN) tablet Take by mouth.    Marland Kitchen. omeprazole (PRILOSEC) 40 MG capsule Take 40 mg by mouth 2 (two) times a day.    .Marland Kitchen  ondansetron (ZOFRAN) 4 MG tablet Take 4 mg by mouth.    . OXYGEN Inhale 3 L into the lungs at bedtime.    . Revefenacin (YUPELRI) 175 MCG/3ML SOLN Inhale 3 mLs into the lungs daily. 90 mL 5  . doxycycline (VIBRA-TABS) 100 MG tablet Take 1 tablet (100 mg total) by mouth 2 (two) times daily. 14 tablet 0  . predniSONE (DELTASONE) 10 MG tablet Take 4 tabs po daily x 2 days; then 3 tabs for 2 days; then 2 tabs for 2 days; then 1 tab for 2 days 20 tablet 0  . predniSONE (DELTASONE) 10 MG tablet Take 1 tablet once daily after breakfast for 5 days 5 tablet 0   Facility-Administered Medications Prior to Visit  Medication Dose Route Frequency Provider Last Rate Last Dose  . Mepolizumab SOLR 100 mg  100 mg Subcutaneous Q28 days Dara Hoyer, FNP   100 mg at 12/23/18 0920  . omalizumab Arvid Right) injection 150 mg  150 mg Subcutaneous Q14 Days Valentina Shaggy, MD   150 mg at 04/02/19 6269   Review of Systems  Review of Systems  HENT: Positive for postnasal drip.   Respiratory: Positive for cough and shortness of breath.   Gastrointestinal: Positive for diarrhea.   Physical Exam  BP 124/70 (BP Location: Right Arm, Cuff Size: Normal)   Pulse 88   Temp (!) 97.2 F (36.2 C)  (Oral)   Ht 5\' 8"  (1.727 m)   Wt 157 lb 9.6 oz (71.5 kg)   SpO2 93%   BMI 23.96 kg/m  Physical Exam Constitutional:      Appearance: Normal appearance.  HENT:     Head: Normocephalic and atraumatic.     Mouth/Throat:     Comments: Deferred d/t masking Cardiovascular:     Rate and Rhythm: Normal rate and regular rhythm.  Pulmonary:     Effort: Pulmonary effort is normal.     Breath sounds: Normal breath sounds. No wheezing or rhonchi.  Musculoskeletal: Normal range of motion.  Skin:    General: Skin is warm and dry.  Neurological:     General: No focal deficit present.     Mental Status: She is alert and oriented to person, place, and time. Mental status is at baseline.  Psychiatric:     Comments: Anxious     Lab Results:  CBC    Component Value Date/Time   WBC 9.7 09/23/2018 1021   RBC 4.77 09/23/2018 1021   HGB 14.7 09/23/2018 1021   HCT 44.3 09/23/2018 1021   PLT 380 09/23/2018 1021   MCV 93 09/23/2018 1021   MCH 30.8 09/23/2018 1021   MCHC 33.2 09/23/2018 1021   RDW 12.5 09/23/2018 1021   LYMPHSABS 2.7 09/23/2018 1021   EOSABS 0.2 09/23/2018 1021   BASOSABS 0.1 09/23/2018 1021    BMET No results found for: NA, K, CL, CO2, GLUCOSE, BUN, CREATININE, CALCIUM, GFRNONAA, GFRAA  BNP No results found for: BNP  ProBNP No results found for: PROBNP  Imaging: No results found.   Assessment & Plan:   Asthma-COPD overlap syndrome (HCC) - Frequent exacerbations requiring antibiotics and oral steroids - Continues to have cough with light green mucus and moderate persistent dyspnea - She feels very strongly that she needs daily prednisone, this has been discussed at length with patient as to why it is not recommended - RX extended prednisone taper  - Add Daliresp 250mg  once daily x 4 weeks, if tolerated increase to 500mg  daily- sample pack  given to patient  - Continue Pulmicort (budesonide) twice a day - Continue Perforomist (Brovana) twice a day  - Continue  Yupelri once in morning - Continues Xolair prescribed by Allergist Dr. Dellis Anes - Referred to pulmonary rehab, this is on hold d/t covid  - Follow-up in 6 weeks with Dr. Kemper Durie   Chronic respiratory failure with hypoxia (HCC) - O2 89% on RA today - She appears resistant to using her oxygen but brought her POC in with her today - Continue 2-4L on exertion to keep O2 > 88-90%   Glenford Bayley, NP 04/09/2019

## 2019-04-09 NOTE — Assessment & Plan Note (Addendum)
-   Frequent exacerbations requiring antibiotics and oral steroids - Continues to have cough with light green mucus and moderate persistent dyspnea - She feels very strongly that she needs daily prednisone, this has been discussed at length with patient as to why it is not recommended - RX extended prednisone taper  - Add Daliresp 250mg  once daily x 4 weeks, if tolerated increase to 500mg  daily- sample pack given to patient  - Continue Pulmicort (budesonide) twice a day - Continue Perforomist (Brovana) twice a day  - Continue Yupelri once in morning - Continues Xolair prescribed by Allergist Dr. Ernst Bowler - Referred to pulmonary rehab, this is on hold d/t covid  - Follow-up in 6 weeks with Dr. Loletta Specter

## 2019-04-09 NOTE — Patient Instructions (Signed)
New Recommendations: Daliresp started pack; take 1 tab (250mg ) daily x 4 weeks (then will send in RX increased 500mg  daily)  COPD: Continue Pulmicort (budesonide) twice a day- this is a steroid nebulizer Continue Perforomist (Brovana) twice a day - this is a long acting bronchodilator Continue Yupelri once in morning- this is for COPD   Allergic rhinitis: Continue Zyrtec and Singulair daily Add ocean nasal spray twice daily for nasal congestion/upper airway cough   Follow-up: 6 weeks with Dr. Loletta Specter

## 2019-04-15 DIAGNOSIS — J4540 Moderate persistent asthma, uncomplicated: Secondary | ICD-10-CM | POA: Diagnosis not present

## 2019-04-16 ENCOUNTER — Other Ambulatory Visit: Payer: Self-pay

## 2019-04-16 ENCOUNTER — Ambulatory Visit (INDEPENDENT_AMBULATORY_CARE_PROVIDER_SITE_OTHER): Payer: Medicare Other

## 2019-04-16 DIAGNOSIS — J4540 Moderate persistent asthma, uncomplicated: Secondary | ICD-10-CM

## 2019-04-28 ENCOUNTER — Telehealth: Payer: Self-pay | Admitting: Primary Care

## 2019-04-28 NOTE — Telephone Encounter (Signed)
Call returned to patient, confirmed DOB, she states she had 7 pills left of her Daliresp 250mg  but she has misplaced them. She states NP Volanda Napoleon wanted to increase the dose on the medication so she wanted the refill sent in with the increased dose if it is still recommended. She reports she is still having diarrhea. She said it has decreased down to about 4x/day. She reports she drinks plenty of water to compensate so she does not get dehydrated. She reports while she has diarrhea the daliresp has significantly improved her breathing, stating she is not as breathless. She also states she has laryngitis (hoarseness), she reports she forgot to gargle and she is concerned. Requesting recommendations.   EW please advise. Do you want her to finish the 7 pills of the daliresp or can we go ahead and send in [place] 500mg  1 tab daily. Recommendations for laryngitis? Thanks.

## 2019-04-28 NOTE — Telephone Encounter (Signed)
She should be on 250mg  daily for 1 month. Then can increase to 500mg  daily. So yes I want her to finished 7 pills left of Daliresp 250mg  first.   Laryngitis recommend voice rest, tea with honey, sugar free lozenges, salt water gargle. No mint or menthol products.

## 2019-04-28 NOTE — Telephone Encounter (Signed)
Pt returning call.  [phone]

## 2019-04-28 NOTE — Telephone Encounter (Signed)
Attempted to call pt but unable to reach. Left message for pt to return call. 

## 2019-04-28 NOTE — Telephone Encounter (Signed)
lmtcb for pt.  

## 2019-04-29 DIAGNOSIS — J4540 Moderate persistent asthma, uncomplicated: Secondary | ICD-10-CM | POA: Diagnosis not present

## 2019-04-29 NOTE — Telephone Encounter (Signed)
Patient is returning the call. CB [phone]

## 2019-04-29 NOTE — Telephone Encounter (Signed)
LMTCB x2 for pt 

## 2019-04-29 NOTE — Telephone Encounter (Signed)
Left message for patient to call back  

## 2019-04-30 ENCOUNTER — Other Ambulatory Visit: Payer: Self-pay

## 2019-04-30 ENCOUNTER — Ambulatory Visit (INDEPENDENT_AMBULATORY_CARE_PROVIDER_SITE_OTHER): Payer: Medicare Other

## 2019-04-30 DIAGNOSIS — J4540 Moderate persistent asthma, uncomplicated: Secondary | ICD-10-CM | POA: Diagnosis not present

## 2019-04-30 NOTE — Telephone Encounter (Signed)
LMTCB x3 for pt. We have attempted to contact pt several times with no success or call back from pt. Per triage protocol, message will be closed.   

## 2019-05-11 ENCOUNTER — Telehealth: Payer: Self-pay | Admitting: *Deleted

## 2019-05-11 NOTE — Telephone Encounter (Signed)
Pt called she says she has been wheezing and thinks it may be due to the weather. Last week she found out that her air conditioning in her bedroom has black mold which is 2 feet away from where she sleeps. She usually gets air conditioning unit cleaned every 6 months and it was cleaned with bleach and that chemical bothers her. 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 She wanted Dr. Ernst Bowler to be aware of her situation.

## 2019-05-12 NOTE — Telephone Encounter (Signed)
Noted. Let's call her back near the end of the week to see how she is doing.  Salvatore Marvel, MD [facility]

## 2019-05-13 DIAGNOSIS — J4540 Moderate persistent asthma, uncomplicated: Secondary | ICD-10-CM

## 2019-05-14 ENCOUNTER — Other Ambulatory Visit: Payer: Self-pay

## 2019-05-14 ENCOUNTER — Ambulatory Visit (INDEPENDENT_AMBULATORY_CARE_PROVIDER_SITE_OTHER): Payer: Medicare Other

## 2019-05-14 DIAGNOSIS — J4540 Moderate persistent asthma, uncomplicated: Secondary | ICD-10-CM | POA: Diagnosis not present

## 2019-05-14 NOTE — Telephone Encounter (Addendum)
Pt still wheezing off and on but using an air purifier. She said the damage is done but if she calls with any problems she just wanted Dr. Ernst Bowler to know why. She is coming in for xolair injection.

## 2019-05-19 NOTE — Progress Notes (Signed)
Synopsis: Former patient of Dr. McQuaid with asthma/COPD overlap, chronic hypoxic respiratory failure. Has Anne Rich referred to hospice previously.  Subjective:   PATIENT ID: Anne Rich GENDER: female DOB: May 25, 1951, MRN: [id number]  Chief Complaint  Patient presents with  . Follow-up    Patient is here for shortness of breath with exertion. Patient has noticed she needs to turn up her oxygen more recently.    Anne Rich is a 68 year old woman who presents for follow-up of chronic hypoxic respiratory failure and asthma/ COPD overlap.  Her symptoms have been stable since she was last seen.  She continues to receive omalizumab infusions per her allergist.  She was given prednisone taper in late October due to worsened respiratory symptoms and was started on low-dose Daliresp at that time.  She had some diarrhea, but felt that overall her symptoms improved with Daliresp.  She has been off for about 3 weeks due to not having her prescription at the pharmacy.  She continues to use her oxygen to 2 to 4 L/min; when using her portable concentrator the oxygen is pulsed.  She monitors her saturations and they have stayed about 88% or higher.  She continues to walk about 2.5 miles every morning in a group exercise session.  She has noted that her air-conditioner had mold in it.  She has an air purifier now, and is concerned about having had mold exposure. She reports that she cannot relocate to different housing.  She has continued her nebulized medications-Perforomist, Yupelri, budesonide inhalers.  She thinks that her Perforomist may be making her worse, wants to know if there is an alternative.  She is up-to-date on her flu and pneumonia vaccines.       OV 02/19/2019: Anne Rich is a 68 year old woman with a history of severe persistent asthma/COPD overlap syndrome, chronic hypoxic respiratory failure on 2 L home oxygen, and perennial allergic rhinitis. She quit smoking in 1997 after a 7.5 pack year history.  She is on omalizumab per her allergist Dr. Dellis Rich, Symbicort, and nebulized ICS, antimuscarinic, and beta agonist (Pulmicort, Yupelri, and Perforomist).  She has significant anxiety, which her psychiatrist has been treating with PRN benzodiazepines.  She requested hospice services in July 2020; recent notes from her primary care doctor at [facility] indicate hospice of [facility] has been in touch with them. Her baseline activity level is being independent with use of a walker.  She is able to walk 2 miles every morning. Was participating in pulmonary rehab through [facility] from mid-October 2 mid-December 2019.  She did not complete the program due to leg pain and personal reasons per their notes. She had exacerbations in February & April 2020, for which she was given prednisone.  In late August she was having worsened symptoms, and via tele-visit a 15-day prednisone taper was prescribed.  She was also referred to palliative care for assistance with symptom management and clarification of her goals of care moving forward.  She had a visit with them on 02/13/2019, where she declined their services.  In early August she declined the services of hospice due to feeling overwhelmed, but is willing to accept their services.  Home health care services have been prescribed.  Previously [facility] has requested a home ventilator for the patient, but she does not want to use the home ventilator.  Currently she is in the middle of her prednisone taper, taking 10 mg of prednisone daily.  She felt better at the 30 mg daily dose,  but does not feel like at the lower doses she has any benefit.  She has more energy at the higher doses.  Her cough and dyspnea are at baseline, which is occasional productive production of yellow sputum, and infrequent coughing. She has been complaint with her meds. She takes xopenex 3 puffs prior to walking. She has not received her xopenex neb solution yet, but she has the rescue  inhaler.  Her allergies are well controlled.  She has shortness of breath with activity, having to stop during her 2 mile walk.  She continues to exercise in a group setting every morning, 6 days/week.  She is still able to care for herself and takes great pride in doing things to help other people in her senior living community.  She has a lot of anxiety, especially associated with shortness of breath in the evenings.  She is anxious about her Ativan dose, and regrets that it is not the 2 mg dose that she was taking for years for her seizure disorder.  Her Psychiatrist decreased the dose, but she is reluctant to continue to follow-up with them because she does not want to do tele-visits.  She endorses frequent crying, but is looking forward to working with the social work Veterinary surgeoncounselor.  She Rich to eventually get back to pulmonary rehab at [facility], but reports that they are currently closed and she needs to get her back injection completed before she could participate.      Past Medical History:  Diagnosis Date  . Asthma      Family History  Problem Relation Age of Onset  . Allergic rhinitis Neg Hx   . Angioedema Neg Hx   . Asthma Neg Hx   . Eczema Neg Hx   . Immunodeficiency Neg Hx   . Urticaria Neg Hx      Past Surgical History:  Procedure Laterality Date  . ABDOMINAL HYSTERECTOMY    . APPENDECTOMY    . CESAREAN SECTION      Social History   Socioeconomic History  . Marital status: Married    Spouse name: Not on file  . Number of children: Not on file  . Years of education: Not on file  . Highest education level: Not on file  Occupational History  . Not on file  Tobacco Use  . Smoking status: Former Smoker    Packs/day: 0.50    Years: 15.00    Pack years: 7.50    Types: Cigarettes    Quit date: 06/12/1995    Years since quitting: 23.9  . Smokeless tobacco: Never Used  Substance and Sexual Activity  . Alcohol use: No  . Drug use: No  . Sexual activity: Not on  file  Other Topics Concern  . Not on file  Social History Narrative  . Not on file   Social Determinants of Health   Financial Resource Strain:   . Difficulty of Paying Living Expenses: Not on file  Food Insecurity:   . Worried About Programme researcher, broadcasting/film/videounning Out of Food in the Last Year: Not on file  . Ran Out of Food in the Last Year: Not on file  Transportation Needs:   . Lack of Transportation (Medical): Not on file  . Lack of Transportation (Non-Medical): Not on file  Physical Activity:   . Days of Exercise per Week: Not on file  . Minutes of Exercise per Session: Not on file  Stress:   . Feeling of Stress : Not on file  Social Connections:   .  Frequency of Communication with Friends and Family: Not on file  . Frequency of Social Gatherings with Friends and Family: Not on file  . Attends Religious Services: Not on file  . Active Member of Clubs or Organizations: Not on file  . Attends Banker Meetings: Not on file  . Marital Status: Not on file  Intimate Partner Violence:   . Fear of Current or Ex-Partner: Not on file  . Emotionally Abused: Not on file  . Physically Abused: Not on file  . Sexually Abused: Not on file     Allergies  Allergen Reactions  . Iodine Itching, Rash and Swelling    IV contrast   . Iodine-131 Itching and Swelling    Pt states causes severe itching Pt states causes severe itching   . Other Shortness Of Breath  . Penicillins Hives  . Pineapple Other (See Comments) and Swelling    Pt states causes her throat swelling Pt states causes her throat swelling   . Pravastatin     Pt states she couldn't lift her legs to walk  . Shellfish Allergy Rash and Swelling    Had reaction to cardiac cath dye  Had seizure ,rash ,itch Oct. 2012 Had reaction to cardiac cath dye  Had seizure ,rash ,itch Oct. 2012  Seizure during Cardiac Cath 2012  . 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 Azithromycin      INEFFECTIVE  .  Hydrocodone-Homatropine   . Levofloxacin   . Shellfish-Derived Products   . Iodinated Diagnostic Agents Rash     Immunization History  Administered Date(s) Administered  . Fluad Quad(high Dose 65+) 02/19/2019  . Influenza, High Dose Seasonal PF 03/15/2016, 03/25/2017, 03/06/2018  . Pneumococcal Conjugate-13 04/16/2014  . Pneumococcal Polysaccharide-23 01/29/2011, 06/28/2015  . Tdap 08/07/2014    Outpatient Medications Prior to Visit  Medication Sig Dispense Refill  . albuterol (VENTOLIN HFA) 108 (90 Base) MCG/ACT inhaler Inhale 2 puffs into the lungs every 4 (four) hours as needed for wheezing or shortness of breath. 18 g 5  . aspirin 81 MG tablet Take 81 mg by mouth daily.    Marland Kitchen b complex vitamins capsule Take 1 capsule by mouth daily.    . carbamazepine (TEGRETOL) 200 MG tablet Take 200 mg by mouth 2 (two) times daily. 1 tab in the morning and 2 at night    . cetirizine (ZYRTEC) 10 MG tablet Take 10 mg by mouth daily.    . cyclobenzaprine (FLEXERIL) 5 MG tablet Take 5 mg by mouth daily.    . diclofenac sodium (VOLTAREN) 1 % GEL Apply to affected area four times daily as needed for pain.    Marland Kitchen dicyclomine (BENTYL) 10 MG capsule TAKE 1 CAPSULE (10 MG TOTAL) BY MOUTH 4 TIMES DAILY BEFORE MEALS AND NIGHTLY. AS NEEDED    . EPINEPHrine (EPIPEN 2-PAK) 0.3 mg/0.3 mL IJ SOAJ injection Inject 0.3 mg into the muscle Once PRN.    Marland Kitchen estradiol (ESTRACE) 1 MG tablet Take 1 mg by mouth daily.  3  . formoterol (PERFOROMIST) 20 MCG/2ML nebulizer solution Take 2 mLs (20 mcg total) by nebulization 2 (two) times daily. This replaces symbicort 120 mL 6  . gabapentin (NEURONTIN) 600 MG tablet Take 600 mg by mouth 2 (two) times daily. 1 in the morning and 2 at bedtime    . levalbuterol (XOPENEX HFA) 45 MCG/ACT inhaler Inhale 1 puff into the lungs every 6 (six) hours as needed for wheezing  or shortness of breath. 1 Inhaler 12  . levalbuterol (XOPENEX) 0.63 MG/3ML nebulizer solution Take 3 mLs (0.63 mg total) by  nebulization every 6 (six) hours as needed for wheezing or shortness of breath. 360 mL 12  . levETIRAcetam (KEPPRA) 1000 MG tablet Take 1,000 mg by mouth 2 (two) times daily. 1 in the morning and 2 at night    . lidocaine (LIDODERM) 5 % APPLY PATCH TO PAINFUL AREA. PATCH MAY REMAIN IN PLACE FOR UP TO 12 HOURS IN A 24 HOUR PERIOD.    Marland Kitchen lisinopril (PRINIVIL,ZESTRIL) 10 MG tablet Take 10 mg by mouth daily.     Marland Kitchen LORazepam (ATIVAN) 1 MG tablet Take 1 mg by mouth 3 (three) times daily.    . meclizine (ANTIVERT) 25 MG tablet Take 25 mg by mouth 2 (two) times daily as needed.     . mirtazapine (REMERON) 30 MG tablet every evening.    . montelukast (SINGULAIR) 10 MG tablet TAKE 1 TABLET BY MOUTH EVERYDAY AT BEDTIME 90 tablet 1  . Multiple Vitamin (MULTIVITAMIN) tablet Take by mouth.    Marland Kitchen omeprazole (PRILOSEC) 40 MG capsule Take 40 mg by mouth 2 (two) times a day.    . ondansetron (ZOFRAN) 4 MG tablet Take 4 mg by mouth.    . OXYGEN Inhale 3 L into the lungs at bedtime.    . predniSONE (DELTASONE) 10 MG tablet Take 4 tabs po daily x 4 days; then 3 tabs daily x4 days; then 2 tabs daily x4 days; then 1 tab daily x 4 days 40 tablet 0  . Roflumilast (DALIRESP) 250 MCG TABS Take 1 tablet by mouth daily. 28 tablet 0  . budesonide (PULMICORT) 0.5 MG/2ML nebulizer solution Take 2 mLs (0.5 mg total) by nebulization 2 (two) times a day. This replaces symbicort 120 mL 6  . Revefenacin (YUPELRI) 175 MCG/3ML SOLN Inhale 3 mLs into the lungs daily. 90 mL 5   Facility-Administered Medications Prior to Visit  Medication Dose Route Frequency Provider Last Rate Last Admin  . Mepolizumab SOLR 100 mg  100 mg Subcutaneous Q28 days Hetty Blend, FNP   100 mg at 12/23/18 0920  . omalizumab Geoffry Paradise) injection 150 mg  150 mg Subcutaneous Q14 Days Alfonse Spruce, MD   150 mg at 05/14/19 0901    Review of Systems  Constitutional: Negative for chills and fever.  HENT: Negative for congestion and sore throat.   Eyes:  Negative.   Respiratory: Positive for cough, sputum production, shortness of breath and wheezing.   Cardiovascular: Negative for chest pain and leg swelling.  Gastrointestinal: Negative for heartburn, nausea and vomiting.  Skin: Negative for itching and rash.     Objective:   Vitals:   05/21/19 0907  BP: 120/70  Temp: (!) 97 F (36.1 C)  TempSrc: Temporal  SpO2: 93%  Weight: 157 lb 9.6 oz (71.5 kg)  Height:  (1.727 m)   93% on  4L pulse O2 BMI Readings from Last 3 Encounters:  05/21/19 23.96 kg/m  04/09/19 23.96 kg/m  03/16/19 23.84 kg/m   Wt Readings from Last 3 Encounters:  05/21/19 157 lb 9.6 oz (71.5 kg)  04/09/19 157 lb 9.6 oz (71.5 kg)  03/16/19 156 lb 12.8 oz (71.1 kg)    Physical Exam Vitals reviewed.  Constitutional:      General: She is not in acute distress.    Appearance: Normal appearance. She is not ill-appearing.  HENT:     Head: Normocephalic and atraumatic.  Nose:     Comments: Deferred due to masking requirement.    Mouth/Throat:     Comments: Deferred due to masking requirement. Eyes:     General: No scleral icterus. Cardiovascular:     Rate and Rhythm: Normal rate and regular rhythm.     Heart sounds: No murmur.  Pulmonary:     Comments: Breathing comfortably on 4 L pulsed oxygen.  No conversational dyspnea.  Clear to auscultation bilaterally, distant breath sounds. Abdominal:     General: There is no distension.     Palpations: Abdomen is soft.     Tenderness: There is no abdominal tenderness.  Musculoskeletal:        General: No swelling or deformity.     Cervical back: Neck supple. No rigidity.  Skin:    General: Skin is warm and dry.     Findings: No rash.  Neurological:     General: No focal deficit present.     Mental Status: She is alert.     Motor: No weakness.     Coordination: Coordination normal.  Psychiatric:        Mood and Affect: Mood normal.        Behavior: Behavior normal.      CBC    Component  Value Date/Time   WBC 9.7 09/23/2018 1021   RBC 4.77 09/23/2018 1021   HGB 14.7 09/23/2018 1021   HCT 44.3 09/23/2018 1021   PLT 380 09/23/2018 1021   MCV 93 09/23/2018 1021   MCH 30.8 09/23/2018 1021   MCHC 33.2 09/23/2018 1021   RDW 12.5 09/23/2018 1021   LYMPHSABS 2.7 09/23/2018 1021   EOSABS 0.2 09/23/2018 1021   BASOSABS 0.1 09/23/2018 1021     Chest Imaging- films reviewed: CT chest 06/19/2018- centrilobular emphysema, upper lobe predominant.  Peripheral opacity in the lingula with distal bronchiectasis- suspicious for scar from a previous pneumonia.  Bleb in the lingula.  Mild dependent groundglass opacities in the bases.  No significant adenopathy.  Expiratory films confirm severe dynamic collapse of the trachea and proximal bronchi.  Pulmonary Functions Testing Results: No flowsheet data found.  Office-based biometry from Dr. Gillermina Hu office-flow volume loop with severe obstruction.  FEV1 0.75 L.  Echocardiogram: LVEF 60 to 65% with normal diastolic function.  Normal LA and right a.  Normal RV systolic function and size.  Normal valves.  LHC 01/14/2018: EF 65%, no significant coronary disease.    Assessment & Plan:     ICD-10-CM   1. Asthma-COPD overlap syndrome ([place])  J44.9   2. Chronic respiratory failure with hypoxia ([facility])  J96.11   3. Stage 4 very severe COPD by GOLD classification ([place])  J44.9    COPD-end-stage.  Asthma overlap features. She has had frequent steroid exposure in the past. -Continue nebulized medications Yupelri and budesonide.  Will switch Perforomist to Serevent inhaler twice daily to see if this works better for her. -Continue SABA as needed for symptoms -Continue omalizumab per Allergy -Restarting Roflumilast.  We will start at low-dose for a month and increase to full dose assuming her symptoms are tolerable.  -Continue allergic rhinitis and GERD management -Congratulated her on her continued physical activity, which she should continue.  -Continue Covid precautions-social distancing, mask wearing, handwashing.  She is concerned about contracting Covid due to living in a apartment building, but I think this is less of a risk than exposures in a group exercise environment or other community exposures at this point. -I am unable to provide  a requested note recommending against mask wearing.  With her severe lung disease, she would likely not do well if she were to contract Covid, and all precautions should be taken to prevent this infection. -Recommend pulmonary rehab when she is able.  Currently she cannot exercise in a mask, and therefore would not be able to participate at this time.  Chronic hypoxic respiratory failure-requires 3 L of oxygen with sleep and 4 L with walking -Continue supplemental oxygen as needed.  If her oxygen needs increase, she may no longer be able to use portable concentrator's as these only provide pulse dose oxygen at higher amounts.   RTC in 3 months.   Current Outpatient Medications:  .  albuterol (VENTOLIN HFA) 108 (90 Base) MCG/ACT inhaler, Inhale 2 puffs into the lungs every 4 (four) hours as needed for wheezing or shortness of breath., Disp: 18 g, Rfl: 5 .  aspirin 81 MG tablet, Take 81 mg by mouth daily., Disp: , Rfl:  .  b complex vitamins capsule, Take 1 capsule by mouth daily., Disp: , Rfl:  .  budesonide (PULMICORT) 0.5 MG/2ML nebulizer solution, Take 2 mLs (0.5 mg total) by nebulization 2 (two) times daily. This replaces symbicort, Disp: 120 mL, Rfl: 11 .  carbamazepine (TEGRETOL) 200 MG tablet, Take 200 mg by mouth 2 (two) times daily. 1 tab in the morning and 2 at night, Disp: , Rfl:  .  cetirizine (ZYRTEC) 10 MG tablet, Take 10 mg by mouth daily., Disp: , Rfl:  .  cyclobenzaprine (FLEXERIL) 5 MG tablet, Take 5 mg by mouth daily., Disp: , Rfl:  .  diclofenac sodium (VOLTAREN) 1 % GEL, Apply to affected area four times daily as needed for pain., Disp: , Rfl:  .  dicyclomine (BENTYL) 10 MG  capsule, TAKE 1 CAPSULE (10 MG TOTAL) BY MOUTH 4 TIMES DAILY BEFORE MEALS AND NIGHTLY. AS NEEDED, Disp: , Rfl:  .  EPINEPHrine (EPIPEN 2-PAK) 0.3 mg/0.3 mL IJ SOAJ injection, Inject 0.3 mg into the muscle Once PRN., Disp: , Rfl:  .  estradiol (ESTRACE) 1 MG tablet, Take 1 mg by mouth daily., Disp: , Rfl: 3 .  formoterol (PERFOROMIST) 20 MCG/2ML nebulizer solution, Take 2 mLs (20 mcg total) by nebulization 2 (two) times daily. This replaces symbicort, Disp: 120 mL, Rfl: 6 .  gabapentin (NEURONTIN) 600 MG tablet, Take 600 mg by mouth 2 (two) times daily. 1 in the morning and 2 at bedtime, Disp: , Rfl:  .  levalbuterol (XOPENEX HFA) 45 MCG/ACT inhaler, Inhale 1 puff into the lungs every 6 (six) hours as needed for wheezing or shortness of breath., Disp: 1 Inhaler, Rfl: 12 .  levalbuterol (XOPENEX) 0.63 MG/3ML nebulizer solution, Take 3 mLs (0.63 mg total) by nebulization every 6 (six) hours as needed for wheezing or shortness of breath., Disp: 360 mL, Rfl: 12 .  levETIRAcetam (KEPPRA) 1000 MG tablet, Take 1,000 mg by mouth 2 (two) times daily. 1 in the morning and 2 at night, Disp: , Rfl:  .  lidocaine (LIDODERM) 5 %, APPLY PATCH TO PAINFUL AREA. PATCH MAY REMAIN IN PLACE FOR UP TO 12 HOURS IN A 24 HOUR PERIOD., Disp: , Rfl:  .  lisinopril (PRINIVIL,ZESTRIL) 10 MG tablet, Take 10 mg by mouth daily. , Disp: , Rfl:  .  LORazepam (ATIVAN) 1 MG tablet, Take 1 mg by mouth 3 (three) times daily., Disp: , Rfl:  .  meclizine (ANTIVERT) 25 MG tablet, Take 25 mg by mouth 2 (two) times daily  as needed. , Disp: , Rfl:  .  mirtazapine (REMERON) 30 MG tablet, every evening., Disp: , Rfl:  .  montelukast (SINGULAIR) 10 MG tablet, TAKE 1 TABLET BY MOUTH EVERYDAY AT BEDTIME, Disp: 90 tablet, Rfl: 1 .  Multiple Vitamin (MULTIVITAMIN) tablet, Take by mouth., Disp: , Rfl:  .  omeprazole (PRILOSEC) 40 MG capsule, Take 40 mg by mouth 2 (two) times a day., Disp: , Rfl:  .  ondansetron (ZOFRAN) 4 MG tablet, Take 4 mg by  mouth., Disp: , Rfl:  .  OXYGEN, Inhale 3 L into the lungs at bedtime., Disp: , Rfl:  .  predniSONE (DELTASONE) 10 MG tablet, Take 4 tabs po daily x 4 days; then 3 tabs daily x4 days; then 2 tabs daily x4 days; then 1 tab daily x 4 days, Disp: 40 tablet, Rfl: 0 .  Revefenacin (YUPELRI) 175 MCG/3ML SOLN, Inhale 3 mLs into the lungs daily., Disp: 90 mL, Rfl: 11 .  Roflumilast (DALIRESP) 250 MCG TABS, Take 1 tablet by mouth daily., Disp: 28 tablet, Rfl: 0 .  roflumilast (DALIRESP) 500 MCG TABS tablet, Take 1 tablet (500 mcg total) by mouth daily., Disp: 90 tablet, Rfl: 3 .  Roflumilast 250 MCG TABS, Take 250 mcg by mouth daily., Disp: 28 tablet, Rfl: 0 .  salmeterol (SEREVENT DISKUS) 50 MCG/DOSE diskus inhaler, Inhale 1 puff into the lungs 2 (two) times daily., Disp: 1 Inhaler, Rfl: 12  Current Facility-Administered Medications:  Marland Kitchen  Mepolizumab SOLR 100 mg, 100 mg, Subcutaneous, Q28 days, Hetty Blend, FNP, 100 mg at 12/23/18 0920 .  omalizumab Geoffry Paradise) injection 150 mg, 150 mg, Subcutaneous, Q14 Days, Alfonse Spruce, MD, 150 mg at 05/14/19 0901   Steffanie Dunn, DO [facility] 05/21/2019 12:45 PM

## 2019-05-21 ENCOUNTER — Ambulatory Visit (INDEPENDENT_AMBULATORY_CARE_PROVIDER_SITE_OTHER): Payer: Medicare Other | Admitting: Critical Care Medicine

## 2019-05-21 ENCOUNTER — Encounter: Payer: Self-pay | Admitting: Critical Care Medicine

## 2019-05-21 ENCOUNTER — Other Ambulatory Visit: Payer: Self-pay

## 2019-05-21 VITALS — BP 120/70 | Temp 97.0°F | Ht 68.0 in | Wt 157.6 lb

## 2019-05-21 DIAGNOSIS — J449 Chronic obstructive pulmonary disease, unspecified: Secondary | ICD-10-CM | POA: Diagnosis not present

## 2019-05-21 DIAGNOSIS — J4489 Other specified chronic obstructive pulmonary disease: Secondary | ICD-10-CM

## 2019-05-21 DIAGNOSIS — J9611 Chronic respiratory failure with hypoxia: Secondary | ICD-10-CM | POA: Diagnosis not present

## 2019-05-21 MED ORDER — BUDESONIDE 0.5 MG/2ML IN SUSP: 0.5000 mg | Freq: Two times a day (BID) | RESPIRATORY_TRACT | 11 refills | Status: DC

## 2019-05-21 MED ORDER — ROFLUMILAST 500 MCG PO TABS: 500.0000 ug | ORAL_TABLET | Freq: Every day | ORAL | 3 refills | Status: DC

## 2019-05-21 MED ORDER — ROFLUMILAST 250 MCG PO TABS: 250.0000 ug | ORAL_TABLET | Freq: Every day | ORAL | 0 refills | Status: DC

## 2019-05-21 MED ORDER — SEREVENT DISKUS 50 MCG/DOSE IN AEPB: 1.0000 | INHALATION_SPRAY | Freq: Two times a day (BID) | RESPIRATORY_TRACT | 12 refills | Status: DC

## 2019-05-21 MED ORDER — YUPELRI 175 MCG/3ML IN SOLN: 3.0000 mL | Freq: Every day | RESPIRATORY_TRACT | 11 refills | Status: DC

## 2019-05-21 NOTE — Patient Instructions (Addendum)
Thank you for visiting Dr. Carlis Abbott at [facility]. We recommend the following: No orders of the defined types were placed in this encounter.  No orders of the defined types were placed in this encounter.   Meds ordered this encounter  Medications  . Roflumilast 250 MCG TABS    Sig: Take 250 mcg by mouth daily.    Dispense:  28 tablet    Refill:  0  . roflumilast (DALIRESP) 500 MCG TABS tablet    Sig: Take 1 tablet (500 mcg total) by mouth daily.    Dispense:  90 tablet    Refill:  3  . salmeterol (SEREVENT DISKUS) 50 MCG/DOSE diskus inhaler    Sig: Inhale 1 puff into the lungs 2 (two) times daily.    Dispense:  1 Inhaler    Refill:  12  . Revefenacin (YUPELRI) 175 MCG/3ML SOLN    Sig: Inhale 3 mLs into the lungs daily.    Dispense:  90 mL    Refill:  11    Use DX: J44.9  . budesonide (PULMICORT) 0.5 MG/2ML nebulizer solution    Sig: Take 2 mLs (0.5 mg total) by nebulization 2 (two) times daily. This replaces symbicort    Dispense:  120 mL    Refill:  11    Return in about 3 months (around 08/19/2019).    Please do your part to reduce the spread of COVID-19.

## 2019-05-22 ENCOUNTER — Telehealth: Payer: Self-pay | Admitting: Critical Care Medicine

## 2019-05-22 NOTE — Telephone Encounter (Signed)
Spoke with patient. She stated that she went to the pharmacy to pickup her medications but was only able to receive the Daliresp 567mcg for $70 for a 90 day supply. The Daliresp 234mcg was $90 for a 30 day supply, which she stated she could not afford. The Serevent Diskus was $90, she can not afford this as well. She did attempt to call the insurance to get the list of alternatives but she was placed on a long hold. Advised her that I would attempt to find her formulary. I did verify her info over the phone. She still has [place].   Requested a copy of the formulary from [facility]. Will await the copy.

## 2019-05-25 MED ORDER — BUDESONIDE 0.5 MG/2ML IN SUSP: 0.5000 mg | Freq: Two times a day (BID) | RESPIRATORY_TRACT | 11 refills | Status: DC

## 2019-05-25 MED ORDER — YUPELRI 175 MCG/3ML IN SOLN: 3.0000 mL | Freq: Every day | RESPIRATORY_TRACT | 11 refills | Status: DC

## 2019-05-25 NOTE — Telephone Encounter (Signed)
Anne Rich needs to be called into [facility] phone number is [phone].  Budesonide needs to be called into [facility] [phone].  Patient phone number is [phone].

## 2019-05-25 NOTE — Telephone Encounter (Signed)
Spoke with patient.  Let her know we were waiting on paperwork and that nebulizer medication was sent to requested pharmacies.   Will keep open until we receive the formulary

## 2019-05-27 NOTE — Telephone Encounter (Signed)
Cloretta Ned, have you received patient's formulary yet?

## 2019-05-28 ENCOUNTER — Ambulatory Visit: Payer: Self-pay

## 2019-05-29 ENCOUNTER — Ambulatory Visit (INDEPENDENT_AMBULATORY_CARE_PROVIDER_SITE_OTHER): Payer: Medicare Other | Admitting: Pulmonary Disease

## 2019-05-29 ENCOUNTER — Other Ambulatory Visit: Payer: Self-pay

## 2019-05-29 ENCOUNTER — Encounter: Payer: Self-pay | Admitting: Pulmonary Disease

## 2019-05-29 DIAGNOSIS — R5381 Other malaise: Secondary | ICD-10-CM

## 2019-05-29 DIAGNOSIS — Z79899 Other long term (current) drug therapy: Secondary | ICD-10-CM | POA: Diagnosis not present

## 2019-05-29 DIAGNOSIS — J9611 Chronic respiratory failure with hypoxia: Secondary | ICD-10-CM

## 2019-05-29 DIAGNOSIS — J449 Chronic obstructive pulmonary disease, unspecified: Secondary | ICD-10-CM | POA: Diagnosis not present

## 2019-05-29 DIAGNOSIS — J3089 Other allergic rhinitis: Secondary | ICD-10-CM | POA: Diagnosis not present

## 2019-05-29 MED ORDER — DALIRESP 250 MCG PO TABS: 250.0000 ug | ORAL_TABLET | Freq: Every day | ORAL | 0 refills | Status: DC

## 2019-05-29 MED ORDER — PREDNISONE 10 MG PO TABS: ORAL_TABLET | ORAL | 0 refills | Status: DC

## 2019-05-29 NOTE — Assessment & Plan Note (Signed)
Plan: We will need to discuss referral back to pulmonary rehab at next office visit

## 2019-05-29 NOTE — Assessment & Plan Note (Signed)
Plan: Dolorous samples provided for the patient today Patient needs clinical pharmacy team visit to work with inhaler access for Serevent inhaler Patient also needs medication reconciliation Patient likely needs to have updated medication list

## 2019-05-29 NOTE — Patient Instructions (Addendum)
You were seen today by Lauraine Rinne, NP  for:   1. Asthma-COPD overlap syndrome (HCC)  - predniSONE (DELTASONE) 10 MG tablet; 4 tabs for 2 days, then 3 tabs for 2 days, 2 tabs for 2 days, then 1 tab for 2 days, then stop  Dispense: 20 tablet; Refill: 0  Continue budesonide nebs twice daily Continue Yupelri nebs daily  Start Daliresp 250 mcg daily, samples provided today, once samples are completed please start your 500 mcg daily dose  We will need to schedule a clinical pharmacy team visit to help patient with medication access to her Serevent inhaler  2. Chronic respiratory failure with hypoxia (HCC)  Continue oxygen therapy as prescribed  >>>maintain oxygen saturations greater than 88 percent  >>>if unable to maintain oxygen saturations please contact the office  >>>do not smoke with oxygen  >>>can use nasal saline gel or nasal saline rinses to moisturize nose if oxygen causes dryness  3. Perennial allergic rhinitis  Continue Zyrtec Continue Singulair Continue Xolair Continue follow-up with allergy  4. Medication management  Please present to our office in 1-2 weeks for an appointment with the clinical pharmacy team for:  Marland Kitchen Medication Management  . Medication reconciliation  . Medication Access - Serevent  . Inhaler teaching    5. Physical deconditioning  Need to consider referral back to pulmonary rehab as they are open and able to work with you  We recommend today:   Meds ordered this encounter  Medications  . predniSONE (DELTASONE) 10 MG tablet    Sig: 4 tabs for 2 days, then 3 tabs for 2 days, 2 tabs for 2 days, then 1 tab for 2 days, then stop    Dispense:  20 tablet    Refill:  0  . Roflumilast (DALIRESP) 250 MCG TABS    Sig: Take 250 mcg by mouth daily.    Dispense:  28 tablet    Refill:  0    Order Specific Question:   Lot Number?    Answer:   [id number]    Order Specific Question:   Expiration Date?    Answer:   10/08/2021    Order Specific Question:    Manufacturer?    Answer:   [employer] [[id number]]    Follow Up:    Needs 1 to 2-week appointment with clinical pharmacy team  Return in about 4 weeks (around 06/26/2019), or if symptoms worsen or fail to improve, for Follow up with Dr. Carlis Abbott, Follow up with Wyn Quaker FNP-C.   Please do your part to reduce the spread of COVID-19:      Reduce your risk of any infection  and COVID19 by using the similar precautions used for avoiding the common cold or flu:  Marland Kitchen Wash your hands often with soap and warm water for at least 20 seconds.  If soap and water are not readily available, use an alcohol-based hand sanitizer with at least 60% alcohol.  . If coughing or sneezing, cover your mouth and nose by coughing or sneezing into the elbow areas of your shirt or coat, into a tissue or into your sleeve (not your hands). Langley Gauss A MASK when in public  . Avoid shaking hands with others and consider head nods or verbal greetings only. . Avoid touching your eyes, nose, or mouth with unwashed hands.  . Avoid close contact with people who are sick. . Avoid places or events with large numbers of people in one location, like concerts or sporting events. Marland Kitchen  If you have some symptoms but not all symptoms, continue to monitor at home and seek medical attention if your symptoms worsen. . If you are having a medical emergency, call 911.   ADDITIONAL HEALTHCARE OPTIONS FOR PATIENTS  [facility] / e-Visit: [email or web address]         [facility]: [phone]  [facility]: [phone]                   [facility]: [phone]     It is flu season:   >>> Best ways to protect herself from the flu: Receive the yearly flu vaccine, practice good hand hygiene washing with soap and also using hand sanitizer when available, eat a nutritious meals, get adequate rest, hydrate appropriately   Please contact the office if your  symptoms worsen or you have concerns that you are not improving.   Thank you for choosing [facility] for your healthcare, and for allowing [place] to partner with you on your healthcare journey. I am thankful to be able to provide care to you today.   Elisha Headland FNP-C

## 2019-05-29 NOTE — Progress Notes (Signed)
Virtual Visit via Telephone Note  I connected with Anne Rich on 05/29/19 at  2:30 PM EST by telephone and verified that I am speaking with the correct person using two identifiers.  Location: Patient: Home Provider: Office [facility] - [address] [place], [address], [place], [place] [address]   I discussed the limitations, risks, security and privacy concerns of performing an evaluation and management service by telephone and the availability of in person appointments. I also discussed with the patient that there may be a patient responsible charge related to this service. The patient expressed understanding and agreed to proceed.  Patient consented to consult via telephone: Yes People present and their role in pt care: Pt    History of Present Illness: 68 year old female former smoker followed in our office for asthma COPD overlap syndrome  Past medical history: Anxiety, CAD, chronic benzodiazepine use, history of CVA, depression, latent tuberculosis, post polio syndrome, physical deconditioning Smoking history: Former smoker.  Quit 1997.  7.5 pack years Maintenance:Pulmicort nebs, Mikael Spray, Servent inhaler, Daliresp Patient of Dr. Chestine Spore  Chief complaint: Acute visit, televisit, short of breath   68 year old female former smoker followed in our office for asthma COPD overlap syndrome, chronic respiratory failure.  She was last seen in our office on 05/21/2019 she continues to be evaluated for ongoing dyspnea on exertion.  Patient was encouraged at that office visit to continue nebulized meds Yupelri and budesonide.  Performist nebs were stopped and patient was switched to a Serevent inhaler.  Continue rescue inhaler as needed.  Continue Xolair as managed by allergy, restarted Daliresp.  Encourage patient to continue increasing physical activity.  Encourage patient to continue oxygen therapy.  Patient was also encouraged to follow-up with Dr. Chestine Spore in 3 months.   Patient contacted our  office on 05/29/2019 reporting that she has had increased shortness of breath and cough.  Patient started the conversation today reporting that she is always short of breath but then reported that she was not short of breath on 05/21/2019 when she was seen by Dr. Chestine Spore.  She has had ongoing issues with access to medications since her 05/21/2019 office visit.  She reports that she still has not found coverage for Serevent inhaler.  She also does not have access to Daliresp 250 mcg for the 28-day dosing to start back on the Daliresp.  She was able to afford the 500 mcg dose.  Patient reports that her shortness of breath is worsened over the last week.  She also had a cough that started yesterday.  She typically walks 2 miles every morning but she has not done this due to her symptoms.  She reports she typically flares when weather/humidity changes like what happened last week.  She has not been utilizing her rescue inhaler as often because she thought that she should only use this whenever it is a matter of "life or death".  She reports she uses her rescue ~1 time daily.  She does have some occasional wheezing.  We will further review this today.  Observations/Objective:  Chest Imaging: CT chest 06/19/2018- centrilobular emphysema, upper lobe predominant.  Peripheral opacity in the lingula with distal bronchiectasis- suspicious for scar from a previous pneumonia.  Bleb in the lingula.  Mild dependent groundglass opacities in the bases.  No significant adenopathy.  Expiratory films confirm severe dynamic collapse of the trachea and proximal bronchi.  Pulmonary Functions Testing Results: No flowsheet data found.  Office-based biometry from Dr. Ellouise Newer office-flow volume loop with severe  obstruction.  FEV1 0.75 L.  Echocardiogram: LVEF 60 to 65% with normal diastolic function.  Normal LA and right a.  Normal RV systolic function and size.  Normal valves.  LHC 01/14/2018: EF 65%, no significant coronary  disease.  Social History   Tobacco Use  Smoking Status Former Smoker  . Packs/day: 0.50  . Years: 15.00  . Pack years: 7.50  . Types: Cigarettes  . Quit date: 06/12/1995  . Years since quitting: 23.9  Smokeless Tobacco Never Used   Immunization History  Administered Date(s) Administered  . Fluad Quad(high Dose 65+) 02/19/2019  . Influenza, High Dose Seasonal PF 03/15/2016, 03/25/2017, 03/06/2018  . Pneumococcal Conjugate-13 04/16/2014  . Pneumococcal Polysaccharide-23 01/29/2011, 06/28/2015  . Tdap 08/07/2014    Assessment and Plan:  Asthma-COPD overlap syndrome (HCC) Plan: Prednisone taper today Continue budesonide nebs Continue Yupelri nebs We will schedule an appointment the clinical pharmacy team to further assess and work with getting access to a Serevent inhaler Daliresp samples provided today to start [name], after completing samples transition to 500 mcg daily Continue Xolair injections as managed by allergy   Chronic respiratory failure with hypoxia ([place]) Plan: Continue to maintain oxygen saturations greater than 88 to 90%  Perennial allergic rhinitis Plan: Continue follow-up with allergy asthma Continue Zyrtec Continue Singulair Continue Xolair   Medication management Plan: [place] samples provided for the patient today Patient needs clinical pharmacy team visit to work with inhaler access for Serevent inhaler Patient also needs medication reconciliation Patient likely needs to have updated medication list  Physical deconditioning Plan: We will need to discuss referral back to pulmonary rehab at next office visit   Follow Up Instructions:  Return in about 4 weeks (around 06/26/2019), or if symptoms worsen or fail to improve, for Follow up with Dr. Carlis Abbott, Follow up with Wyn Quaker FNP-C.   I discussed the assessment and treatment plan with the patient. The patient was provided an opportunity to ask questions and all were answered. The patient  agreed with the plan and demonstrated an understanding of the instructions.   The patient was advised to call back or seek an in-person evaluation if the symptoms worsen or if the condition fails to improve as anticipated.  I provided 24 minutes of non-face-to-face time during this encounter.   Lauraine Rinne, NP

## 2019-05-29 NOTE — Assessment & Plan Note (Signed)
Plan: Continue follow-up with allergy asthma Continue Zyrtec Continue Singulair Continue Xolair

## 2019-05-29 NOTE — Assessment & Plan Note (Signed)
Plan: Continue to maintain oxygen saturations greater than 88 to 90%

## 2019-05-29 NOTE — Assessment & Plan Note (Signed)
Plan: Prednisone taper today Continue budesonide nebs Continue Yupelri nebs We will schedule an appointment the clinical pharmacy team to further assess and work with getting access to a Serevent inhaler Daliresp samples provided today to start [place], after completing samples transition to 500 mcg daily Continue Xolair injections as managed by allergy

## 2019-06-01 ENCOUNTER — Other Ambulatory Visit: Payer: Self-pay

## 2019-06-01 ENCOUNTER — Telehealth: Payer: Self-pay | Admitting: Pulmonary Disease

## 2019-06-01 ENCOUNTER — Ambulatory Visit (INDEPENDENT_AMBULATORY_CARE_PROVIDER_SITE_OTHER): Payer: Medicare Other

## 2019-06-01 DIAGNOSIS — J4540 Moderate persistent asthma, uncomplicated: Secondary | ICD-10-CM

## 2019-06-01 NOTE — Telephone Encounter (Signed)
Spoke with pt, she states Brian's nurse was supposed to call her to schedule an appointment with the pharmacy team. I made her an appt on 06/08/2019 at 11:30am. Nothing further is needed.

## 2019-06-01 NOTE — Telephone Encounter (Signed)
I have not received formulary yet, but patient has appt with pharmacy on 06/08/19 will close encounter for now.

## 2019-06-07 NOTE — Progress Notes (Signed)
Subjective:  Patient presents today to [facility] to see pharmacy team for financial assistance for Serevent inhaler. Patient was referred by Elisha Headland NP, on 05/29/2019. Past medical history includes  COPD-asthma overlap, anxiety, CAD, chronic benzodiazepine use, history of CVA, depression, latent tuberculosis, post polio syndrome, physical deconditioning, bilateral carotid artery stenosis, HTN, migraines, allergic rhinitis, GERD, hepatic steatosis, peripheral neuropathy, and osteopenia.  Patient presents today for initial appt with pharmacy team. She has trouble affording Serevent Diskus. She recently purchased an air purifier and humidifier. She states she has been set up with ABC plus to help afford her nebulized medications. She gets Yupelri  for $0 copay and  budesonide for $6 copay. She makes ~$3,100 per month, which is a total annual income of $~37,000. Patient states "Dr.Mcquaid told her that Symbicort would not be an optimal option for her due to how he does not think medication gets deep enough into her lungs". She is unable to use Respimat inhaler formulation because she cannot twist the inhaler to use it. She states she has been on formoterol (Performist), but had to stop because it "messed with her mind".  She states she originally received Daliresp 250 mg sample via [facility] office. She states her Daliresp 500 mg costs ~47 for 3 month supply and she would be willing to pay this. She did not pick up Daliresp 500 mg because she thought pharmacy appt would help her find a more affordable option. She has currently been off the Daliresp for about 1 month.  Of note, pharmacy technician Ronald Pippins ran test claims for this patient and determined that:  Serevent - # 1 inhaler for a 1 month supply through patient's insurance is $ 79.10.  Striverdi - # 1 inhaler for a 1 month supply through patient's insurance is $ 44.53.  Brovana - # 1 month supply through patient's  insurance is $ 424.97. Medicare D. Medication is non-formulary  Perforomist - # 1 month supply through patient's insurance is $ 210.37 part B/ $146.36 Medicare part B. Preferred.  Modesto Charon - requires Prior authorization  [facility]resp - [facility]ill too [facility]- [facility]  Spiriva - # 1 inhaler for a 1 month supply through patient's insurance is $ 34.99.  Stiolto - # 1 inhaler for a 1 month supply through patient's insurance is $ 9.99.  Mikael Spray - # 1 month supply through patient's insurance is $ 218.53- Part D/ $213.45 Medicare part B.  Respiratory Medications Current: Xolair, montelukast, Yupelri, Serevent Diskus (hasn't been able to pick up due to cost), Daliresp, albuterol prn,  budesonide nebs Tried in Past: Harrington Challenger (lacked efficacy), Symbicort (switched to budesonide nebs, lacked efficacy), Illa Level (states she has not taken this), Atrovent nebs (states she has not taken this), Spiriva Respimat (dexterity, cannot twist), xopenex nebs/inhaler (states she has never taken this), formoterol (states it messes up her mind) Patient reports no known adherence challenges  Asthma Questionnaire Triggers: exertion, mold (in apt and at mall she walks) Coughing at night 3 days/week Coughing when she exerts herself on her walk Reports Chest pain  ER visits in last 6 months: none Hospitalizations: none  Asthma Control (past 4 weeks)  Asthma gets in the way of finishing tasks (school, work, home): all of the time  Shortness of breath: more than once a day    Symptoms causing nighttime awakening: 2-3 nights/week   Frequency of rescue inhaler use: 1-2 times/day   Asthma control: somewhat   COPD Questionnaire  CAT ASSESSMENT  Rank each of the following items on a scale of 0 to 5 (with 5 being most severe) Write a # 0-5 in each box  I never cough (0) > I cough all the time (5) 3  I have no phlegm (mucus) in my chest (0) > My chest is completely full of phlegm (mucus) (5) 4  My chest does  not feel tight at all (0) > My chest feels very tight (5) 5  When I walk up a [place]man[place]al or one flight of stairs I am not breathless (0) > When I walk up a Strom or one flight of stairs I am very breathless (5) 5  I am not limited doing any activities at home (0) > I am very limited doing activities at home (5) 4  I am confident leaving my home despite my lung function (0) > I am not at all confident leaving my home because of my lung condition (5)  5  I sleep soundly (0) > I don't sleep soundly because of my lung condition (5) "she prays every night at her window to live throughout the night"  I have lots of energy (0) > I have no energy at all (5) 3   Total CAT Score: 29  Number of hospitilizations in the last year: 0  Number of COPD exacerbations in the last year: ~12  GOLD Stage 4, Group D  Objective: Allergies  Allergen Reactions  . Iodine Itching, Rash and Swelling    IV contrast   . Iodine-131 Itching and Swelling    Pt states causes severe itching Pt states causes severe itching   . Other Shortness Of Breath  . Penicillins Hives  . Pineapple Other (See Comments) and Swelling    Pt states causes her throat swelling Pt states causes her throat swelling   . Pravastatin     Pt states she couldn't lift her legs to walk  . Shellfish Allergy Rash and Swelling    Had reaction to cardiac cath dye  Had seizure ,rash ,itch Oct. 2012 Had reaction to cardiac cath dye  Had seizure ,rash ,itch Oct. 2012  Seizure during Cardiac Cath 2012  . 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 Azithromycin      INEFFECTIVE  . Hydrocodone-Homatropine   . Levofloxacin   . Shellfish-Derived Products   . Iodinated Diagnostic Agents Rash    Outpatient Encounter Medications as of 06/08/2019  Medication Sig  . albuterol (VENTOLIN HFA) 108 (90 Base) MCG/ACT inhaler Inhale 2 puffs into the lungs every 4 (four) hours as needed for wheezing or shortness of breath.  Marland Kitchen aspirin 81  MG tablet Take 81 mg by mouth daily.  Marland Kitchen b complex vitamins capsule Take 1 capsule by mouth daily.  . budesonide (PULMICORT) 0.5 MG/2ML nebulizer solution Take 2 mLs (0.5 mg total) by nebulization 2 (two) times daily. This replaces symbicort  . carbamazepine (TEGRETOL) 200 MG tablet Take 200 mg by mouth 2 (two) times daily. 1 tab in the morning and 2 at night  . cetirizine (ZYRTEC) 10 MG tablet Take 10 mg by mouth daily.  . cyclobenzaprine (FLEXERIL) 5 MG tablet Take 5 mg by mouth daily.  . diclofenac sodium (VOLTAREN) 1 % GEL Apply to affected area four times daily as needed for pain.  Marland Kitchen dicyclomine (BENTYL) 10 MG capsule TAKE 1 CAPSULE (10 MG TOTAL) BY MOUTH 4 TIMES DAILY BEFORE MEALS AND NIGHTLY. AS NEEDED  . EPINEPHrine (EPIPEN  2-PAK) 0.3 mg/0.3 mL IJ SOAJ injection Inject 0.3 mg into the muscle Once PRN.  Marland Kitchen estradiol (ESTRACE) 1 MG tablet Take 1 mg by mouth daily.  . formoterol (PERFOROMIST) 20 MCG/2ML nebulizer solution Take 2 mLs (20 mcg total) by nebulization 2 (two) times daily. This replaces symbicort  . gabapentin (NEURONTIN) 600 MG tablet Take 600 mg by mouth 2 (two) times daily. 1 in the morning and 2 at bedtime  . levalbuterol (XOPENEX HFA) 45 MCG/ACT inhaler Inhale 1 puff into the lungs every 6 (six) hours as needed for wheezing or shortness of breath.  . levalbuterol (XOPENEX) 0.63 MG/3ML nebulizer solution Take 3 mLs (0.63 mg total) by nebulization every 6 (six) hours as needed for wheezing or shortness of breath.  . levETIRAcetam (KEPPRA) 1000 MG tablet Take 1,000 mg by mouth 2 (two) times daily. 1 in the morning and 2 at night  . lidocaine (LIDODERM) 5 % APPLY PATCH TO PAINFUL AREA. PATCH MAY REMAIN IN PLACE FOR UP TO 12 HOURS IN A 24 HOUR PERIOD.  Marland Kitchen lisinopril (PRINIVIL,ZESTRIL) 10 MG tablet Take 10 mg by mouth daily.   Marland Kitchen LORazepam (ATIVAN) 1 MG tablet Take 1 mg by mouth 3 (three) times daily.  . meclizine (ANTIVERT) 25 MG tablet Take 25 mg by mouth 2 (two) times daily as needed.    . mirtazapine (REMERON) 30 MG tablet every evening.  . montelukast (SINGULAIR) 10 MG tablet TAKE 1 TABLET BY MOUTH EVERYDAY AT BEDTIME  . Multiple Vitamin (MULTIVITAMIN) tablet Take by mouth.  Marland Kitchen omeprazole (PRILOSEC) 40 MG capsule Take 40 mg by mouth 2 (two) times a day.  . ondansetron (ZOFRAN) 4 MG tablet Take 4 mg by mouth.  . OXYGEN Inhale 3 L into the lungs at bedtime.  . predniSONE (DELTASONE) 10 MG tablet Take 4 tabs po daily x 4 days; then 3 tabs daily x4 days; then 2 tabs daily x4 days; then 1 tab daily x 4 days  . predniSONE (DELTASONE) 10 MG tablet 4 tabs for 2 days, then 3 tabs for 2 days, 2 tabs for 2 days, then 1 tab for 2 days, then stop  . Revefenacin (YUPELRI) 175 MCG/3ML SOLN Inhale 3 mLs into the lungs daily.  . Roflumilast (DALIRESP) 250 MCG TABS Take 1 tablet by mouth daily.  . Roflumilast (DALIRESP) 250 MCG TABS Take 250 mcg by mouth daily.  . roflumilast (DALIRESP) 500 MCG TABS tablet Take 1 tablet (500 mcg total) by mouth daily.  . Roflumilast 250 MCG TABS Take 250 mcg by mouth daily.  . salmeterol (SEREVENT DISKUS) 50 MCG/DOSE diskus inhaler Inhale 1 puff into the lungs 2 (two) times daily.   Facility-Administered Encounter Medications as of 06/08/2019  Medication  . Mepolizumab SOLR 100 mg  . omalizumab Geoffry Paradise) injection 150 mg     Immunization History  Administered Date(s) Administered  . Fluad Quad(high Dose 65+) 02/19/2019  . Influenza, High Dose Seasonal PF 03/15/2016, 03/25/2017, 03/06/2018  . Pneumococcal Conjugate-13 04/16/2014  . Pneumococcal Polysaccharide-23 01/29/2011, 06/28/2015  . Tdap 08/07/2014     PFTs 03/16/2019 -- FVC 1.58, FEV1 0.60. Predicted FVC 3.55, predicted FEV1 2.70. Spirometry indicates severe restriction and severe airway obstruction. This is consistent with previous spirometry readings. 12/01/2015 -- FEV1 = 32%, FEV1/FVC = 56% 11/02/2014 --  FEV1 = 29%, FEV1/FVC = 44%.   Chest X-ray 06/19/2018 -- centrilobular emphysema, upper  lobe predominant.  Peripheral opacity in the lingula with distal bronchiectasis- suspicious for scar from a previous pneumonia.  Bleb in  the lingula.  Mild dependent groundglass opacities in the bases.  No significant adenopathy.  Expiratory films confirm severe dynamic collapse of the trachea and proximal bronchi.  Eosinophils Most recent blood eosinophil count was 0.2 cells/microL taken on 09/23/2018.    Assessment and Plan  1. Inhaler Optimization a. Patient currently receives Yupelri (LAMA) and budesonide (ICS) via [facility]. Finances are a barrier to receiving Serevent Diskus (LABA). LABA options for patient would be apply for patient assistance program through [employer] for Serevent Diskus, apply for patient assistance program through [facility] for Striverdi Respimat, formoterol (Perforomist), aformoterol (Brovana). Patient makes about $37,000 therefore would not qualify for patient assistance program for Serevent Diskus (requires you to make <$31,899 annually) or Striverdi Respimat (requires you make <$25,520 annually). Serevent Diskus and Striverdi are not optimal options considering patient likely cannot inhale deep enough breath to benefit from Serevent Diskus use and is not able to twist Striverdi Respimat inhaler. Patient is not willing to use formoterol (Perforomist) due to how it "messes up her mind". Best option would be to contact [facility] to determine if "ABC plus" coverage would apply for Brovana nebulized solution. Patient is willing to try Brovana. Educated patient on mechanism of action, side effects, dosing, and administration of Brovana. Patient does not qualify for patient assistance program for Roflumilast (GSK - requires you to make <$31,899 annually). However, pt is willing to pay $47 copay for 3 month supply for Roflumilast moving forward. Patient states she is low on finances at the moment and cannot afford copay for Roflumilast 250 mg daily. Provided patient with sample of  Roflumilast 250 mg daily to initiate Roflumilast (sample - Roflumilast 250 mg LOT [id number] EXP 11/08/2021). Patient verbalized understanding to the plan.  i. Plan to call [facility] to determine what copay for Rosalyn GessBrovana would be. If feasible copay, will initiate Brovana 15 mcg nebulized twice daily. ii. Start taking roflumilast 250 mg daily x 1 month then increase to roflumilast 500 mg daily  2. Medication Reconciliation a. A drug regimen assessment was performed, including review of allergies, interactions, disease-state management, dosing and immunization history. Medications were reviewed with the patient, including name, instructions, indication, goals of therapy, potential side effects, importance of adherence, and safe use. b. Discontinued i. Prednisone - finished course ii. Mepolizumab - lacked efficacy, switched to Xolair iii. Xopenex inhaler - never picked up  3. Immunizations a. Patient is indicated for the shingles vaccination. Encouraged patient to go to local pharmacy to obtain vaccination. Patient verbalized understanding.  Thank you for involving pharmacy to assist in providing Ms. Maino's care.   Zachery ConchMary Elesha Thedford, PharmD PGY2 Ambulatory Care Pharmacy Resident

## 2019-06-08 ENCOUNTER — Other Ambulatory Visit: Payer: Self-pay

## 2019-06-08 ENCOUNTER — Telehealth: Payer: Self-pay | Admitting: Pharmacy Technician

## 2019-06-08 ENCOUNTER — Ambulatory Visit (INDEPENDENT_AMBULATORY_CARE_PROVIDER_SITE_OTHER): Payer: Medicare Other | Admitting: Pharmacist

## 2019-06-08 ENCOUNTER — Ambulatory Visit: Payer: 59

## 2019-06-08 ENCOUNTER — Telehealth: Payer: Self-pay | Admitting: Pharmacist

## 2019-06-08 DIAGNOSIS — J449 Chronic obstructive pulmonary disease, unspecified: Secondary | ICD-10-CM | POA: Diagnosis not present

## 2019-06-08 MED ORDER — DALIRESP 250 MCG PO TABS: 250.0000 ug | ORAL_TABLET | Freq: Every day | ORAL | 0 refills | Status: DC

## 2019-06-08 MED ORDER — ARFORMOTEROL TARTRATE 15 MCG/2ML IN NEBU: 15.0000 ug | INHALATION_SOLUTION | Freq: Two times a day (BID) | RESPIRATORY_TRACT | 11 refills | Status: DC

## 2019-06-08 NOTE — Telephone Encounter (Signed)
Findings of benefits investigation via test claims at [facility]:   Insurance: [facility] [place]- Medicare Part D   Serevent - # 1 inhaler for a 1 month supply through patient's insurance is $ 79.10.  Striverdi - # 1 inhaler for a 1 month supply through patient's insurance is $ 44.53.  Brovana - # 1 month supply through patient's insurance is $ 424.97. Medicare D. Medication is non-formulary  Perforomist - # 1 month supply through patient's insurance is $ 210.37 part B/ $146.36 Medicare part B. Preferred.  Deirdre Evener - requires Prior authorization  Daliresp - refill too soon- [facility]  Spiriva - # 1 inhaler for a 1 month supply through patient's insurance is $ 34.99.  Stiolto - # 1 inhaler for a 1 month supply through patient's insurance is $ 9.99.  Maretta Bees - # 1 month supply through patient's insurance is $ 218.53- Part D/ $213.45 Medicare part B.   9:15 AM Beatriz Chancellor, CPhT

## 2019-06-08 NOTE — Patient Instructions (Addendum)
It was a pleasure seeing you in clinic today Ms. Anne Rich!  Today the plan is... 1. Start using roflumilast 250 mg daily   2. Continue Xolair, montelukast, Yupelri, albuterol prn, and budesonide nebulizer.   3. I Stanton Kidney) will be in contact with you about getting you Brovana nebulizer.   Please call the PharmD clinic at [phone] if you have any questions that you would like to speak with a pharmacist about Stanton Kidney, Museum/gallery conservator).

## 2019-06-08 NOTE — Telephone Encounter (Signed)
Called patient on 06/08/2019 at 5:07 PM   [place] to determine copay of [place]. [facility] representative stated that it appears there will be a $0 copay for Brovana -- similar to [place]. Representative stated that a pharmacy staff member will be reaching out to the patient to determine time of shipment.   Called patient to notify her of cost for [place]. Patient asked if she would be able to mix Brovana with other nebulized medications. Informed patient that she may mix Brovana with Xopenex and Pulmicort. She may NOT mix Yupelri with any other nebulized medications. Patient verbalized understanding and expressed appreciation.   Recommended patient reach out to pharmacy team in the future if she has any other issues related to costs for her respiratory medications. Patient verbalized understanding.   Drexel Iha, PharmD PGY2 Ambulatory Care Pharmacy Resident

## 2019-06-09 NOTE — Telephone Encounter (Signed)
Anne Rich,  Thanks for working with the patient on this.  I am glad that we are able to find a solution to help the patient.  I have CCed Dr. Carlis Abbott and this is she is aware the patient will be started on Brovana.  Thanks for all you do Larina Earthly

## 2019-06-10 NOTE — Telephone Encounter (Signed)
Thanks! She had requested to try to come off of that med to an inhaler, but I agree this is her best option. Thanks for all of your help!  LPC

## 2019-06-15 ENCOUNTER — Telehealth: Payer: Self-pay | Admitting: Critical Care Medicine

## 2019-06-15 ENCOUNTER — Other Ambulatory Visit: Payer: Self-pay

## 2019-06-15 ENCOUNTER — Encounter: Payer: Self-pay | Admitting: Critical Care Medicine

## 2019-06-15 ENCOUNTER — Ambulatory Visit (INDEPENDENT_AMBULATORY_CARE_PROVIDER_SITE_OTHER): Payer: Medicare Other

## 2019-06-15 ENCOUNTER — Ambulatory Visit (INDEPENDENT_AMBULATORY_CARE_PROVIDER_SITE_OTHER): Payer: Medicare Other | Admitting: Critical Care Medicine

## 2019-06-15 DIAGNOSIS — K219 Gastro-esophageal reflux disease without esophagitis: Secondary | ICD-10-CM

## 2019-06-15 DIAGNOSIS — R0600 Dyspnea, unspecified: Secondary | ICD-10-CM

## 2019-06-15 DIAGNOSIS — J4540 Moderate persistent asthma, uncomplicated: Secondary | ICD-10-CM | POA: Diagnosis not present

## 2019-06-15 DIAGNOSIS — J9611 Chronic respiratory failure with hypoxia: Secondary | ICD-10-CM

## 2019-06-15 DIAGNOSIS — J449 Chronic obstructive pulmonary disease, unspecified: Secondary | ICD-10-CM

## 2019-06-15 DIAGNOSIS — R0609 Other forms of dyspnea: Secondary | ICD-10-CM

## 2019-06-15 NOTE — Telephone Encounter (Signed)
Called and spoke with Patient.  Dr. Ophelia Charter recommendations given.  Patient scheduled tele visit at 4:30pm today with Dr. Chestine Spore.  Nothing further at this time.

## 2019-06-15 NOTE — Telephone Encounter (Signed)
Please make her a televisit with me.  Thanks   LPC

## 2019-06-15 NOTE — Patient Instructions (Addendum)
Thank you for visiting Dr. Chestine Spore at [facility]. We recommend the following:  Con't all current nebs and Daliresp. Con't your omeprazole twice daily as prescribed.  Please all GI to have them re-evaluate your reflux. I am concerned that this is part of why your breathing is worse. Prednisone can worsen acid reflux.   Return in about 1 week (around 06/22/2019).    Please do your part to reduce the spread of COVID-19.

## 2019-06-15 NOTE — Telephone Encounter (Signed)
Called and spoke with Patient.  Patient stated she has felt increased sob with activity.  Patient stated she has started Daliresp and Rosalyn Gess will come today or tomorrow through mail order pharmacy.   Patient stated she walks 2 miles a day and uses albuterol inhaler 3-4 puffs before starting walk. Patient stated she feels that she needs Prednisone to help with her increased sob. Patient [facility] to [facility] Dr. Rondall Allegra.  Message routed to Dr. Chestine Spore to advise

## 2019-06-15 NOTE — Telephone Encounter (Signed)
ATC Patient.  LM for patient to call back to schedule tele visit with Dr. Chestine Spore this afternoon.

## 2019-06-15 NOTE — Progress Notes (Signed)
Synopsis: Former patient of Dr. [name] with asthma/COPD overlap, chronic hypoxic respiratory failure. Has been referred to hospice previously.  Subjective:   PATIENT ID: Anne Rich GENDER: female DOB: [birth year], MRN: [id number]  Chief Complaint  Patient presents with  . Follow-up    Patient has increased shortness of breath that started last Wednesday. Patient states it gets worse when the humidity changes.     Virtual Visit via Telephone Note  I connected with Anne Rich on 06/15/19 at  4:30 PM EST by telephone and verified that I am speaking with the correct person using two identifiers.  Location: Patient: Home Provider: Office [facility] - [address] [place], [address], [place], [place] [address]   I discussed the limitations, risks, security and privacy concerns of performing an evaluation and management service by telephone and the availability of in person appointments. I also discussed with the patient that there may be a patient responsible charge related to this service. The patient expressed understanding and agreed to proceed.  Patient consented to onsult via telephone: Yes People present and their role in pt care: Pt   I discussed the assessment and treatment plan with the patient. The patient was provided an opportunity to ask questions and all were answered. The patient agreed with the plan and demonstrated an understanding of the instructions.   The patient was advised to call back or seek an in-person evaluation if the symptoms worsen or if the condition fails to improve as anticipated.  I provided 40 minutes of non-face-to-face time during this encounter.   Anne Rich is a 69 y/o woman who is being evaluated for SOB. She recently completed a course of prednisone for shortness of breath.  Her symptoms he began 5 days ago, and have limited her ability to walk her usual 2 miles per day.  She attributes this to the change in the weather.  She denies increasing  cough, sputum production, change in color of sputum, or other concerning symptoms.  She has noticed that she gets short of breath with talking too long.  She is concerned that he has someone living in her building on the floor below her has Covid.  She has not been around 10.  She wears a mask when she is outside of her apartment in the building.  She has no current infectious symptoms.  She continues to use her 3 L of oxygen at night and 4 L pulsed oxygen with her portable concentrator.  She is back on her Daliresp, but is only about 1 week in and last time did not see benefit in her symptoms for about 2.5 weeks on medication.  She mentioned that her reflux has been worse-several days per week she will wake up with refluxed gastric contents on her sheets.  She has been avoiding calling back to GI due to concern for being recommended to have a procedure to evaluate this.  She continues to use her omeprazole 40 mg twice daily.  She hardly eats anything for breakfast, eats lunch, and usually only eats a piece of toast for dinner.  She has a history of esophageal strictures which have required dilation in the past.  She is admitting to choking on pills and having trouble with swallowing recently.  She does not drink alcohol, has avoided chocolate, carbonated beverages, and only drinks 1 cup of coffee in the morning.  She does not eat late in the evenings.         OV 05/21/2019: Anne Rich is a 69 year old woman who presents for follow-up of chronic hypoxic respiratory failure and asthma/ COPD overlap.  Her symptoms have been stable since she was last seen.  She continues to receive omalizumab infusions per her allergist.  She was given prednisone taper in late October due to worsened respiratory symptoms and was started on low-dose Daliresp at that time.  She had some diarrhea, but felt that overall her symptoms improved with Daliresp.  She has been off for about 3 weeks due to not having her prescription at  the pharmacy.  She continues to use her oxygen to 2 to 4 L/min; when using her portable concentrator the oxygen is pulsed.  She monitors her saturations and they have stayed about 88% or higher.  She continues to walk about 2.5 miles every morning in a group exercise session.  She has noted that her air-conditioner had mold in it.  She has an air purifier now, and is concerned about having had mold exposure. She reports that she cannot relocate to different housing.  She has continued her nebulized medications-Perforomist, Yupelri, budesonide inhalers.  She thinks that her Perforomist may be making her worse, wants to know if there is an alternative.  She is up-to-date on her flu and pneumonia vaccines.     OV 02/19/2019: Anne Rich is a 69 year old woman with a history of severe persistent asthma/COPD overlap syndrome, chronic hypoxic respiratory failure on 2 L home oxygen, and perennial allergic rhinitis. She quit smoking in 1997 after a 7.5 pack year history. She is on omalizumab per her allergist Dr. Dellis Anes, Symbicort, and nebulized ICS, antimuscarinic, and beta agonist (Pulmicort, Yupelri, and Perforomist).  She has significant anxiety, which her psychiatrist has been treating with PRN benzodiazepines.  She requested hospice services in July 2020; recent notes from her primary care doctor at [facility] indicate hospice of [facility] has been in touch with them. Her baseline activity level is being independent with use of a walker.  She is able to walk 2 miles every morning. Was participating in pulmonary rehab through [facility] from mid-October 2 mid-December 2019.  She did not complete the program due to leg pain and personal reasons per their notes. She had exacerbations in February & April 2020, for which she was given prednisone.  In late August she was having worsened symptoms, and via tele-visit a 15-day prednisone taper was prescribed.  She was also referred to palliative care  for assistance with symptom management and clarification of her goals of care moving forward.  She had a visit with them on 02/13/2019, where she declined their services.  In early August she declined the services of hospice due to feeling overwhelmed, but is willing to accept their services.  Home health care services have been prescribed.  Previously [facility] has requested a home ventilator for the patient, but she does not want to use the home ventilator.  Currently she is in the middle of her prednisone taper, taking 10 mg of prednisone daily.  She felt better at the 30 mg daily dose, but does not feel like at the lower doses she has any benefit.  She has more energy at the higher doses.  Her cough and dyspnea are at baseline, which is occasional productive production of yellow sputum, and infrequent coughing. She has been complaint with her meds. She takes xopenex 3 puffs prior to walking. She has not received her xopenex neb solution yet, but she has the rescue inhaler.  Her  allergies are well controlled.  She has shortness of breath with activity, having to stop during her 2 mile walk.  She continues to exercise in a group setting every morning, 6 days/week.  She is still able to care for herself and takes great pride in doing things to help other people in her senior living community.  She has a lot of anxiety, especially associated with shortness of breath in the evenings.  She is anxious about her Ativan dose, and regrets that it is not the 2 mg dose that she was taking for years for her seizure disorder.  Her Psychiatrist decreased the dose, but she is reluctant to continue to follow-up with them because she does not want to do tele-visits.  She endorses frequent crying, but is looking forward to working with the social work Veterinary surgeoncounselor.  She hopes to eventually get back [facility], but reports that they are currently closed and she needs to get her back injection completed before  she could participate.      Past Medical History:  Diagnosis Date  . Asthma      Family History  Problem Relation Age of Onset  . Allergic rhinitis Neg Hx   . Angioedema Neg Hx   . Asthma Neg Hx   . Eczema Neg Hx   . Immunodeficiency Neg Hx   . Urticaria Neg Hx      Past Surgical History:  Procedure Laterality Date  . ABDOMINAL HYSTERECTOMY    . APPENDECTOMY    . CESAREAN SECTION      Social History   Socioeconomic History  . Marital status: Married    Spouse name: Not on file  . Number of children: Not on file  . Years of education: Not on file  . Highest education level: Not on file  Occupational History  . Not on file  Tobacco Use  . Smoking status: Former Smoker    Packs/day: 0.50    Years: 15.00    Pack years: 7.50    Types: Cigarettes    Quit date: 06/12/1995    Years since quitting: 24.0  . Smokeless tobacco: Never Used  Substance and Sexual Activity  . Alcohol use: No  . Drug use: No  . Sexual activity: Not on file  Other Topics Concern  . Not on file  Social History Narrative  . Not on file   Social Determinants of Health   Financial Resource Strain:   . Difficulty of Paying Living Expenses: Not on file  Food Insecurity:   . Worried About Programme researcher, broadcasting/film/videounning Out of Food in the Last Year: Not on file  . Ran Out of Food in the Last Year: Not on file  Transportation Needs:   . Lack of Transportation (Medical): Not on file  . Lack of Transportation (Non-Medical): Not on file  Physical Activity:   . Days of Exercise per Week: Not on file  . Minutes of Exercise per Session: Not on file  Stress:   . Feeling of Stress : Not on file  Social Connections:   . Frequency of Communication with Friends and Family: Not on file  . Frequency of Social Gatherings with Friends and Family: Not on file  . Attends Religious Services: Not on file  . Active Member of Clubs or Organizations: Not on file  . Attends BankerClub or Organization Meetings: Not on file  . Marital  Status: Not on file  Intimate Partner Violence:   . Fear of Current or  Ex-Partner: Not on file  . Emotionally Abused: Not on file  . Physically Abused: Not on file  . Sexually Abused: Not on file     Allergies  Allergen Reactions  . Iodine Itching, Rash and Swelling    IV contrast   . Iodine-131 Itching and Swelling    Pt states causes severe itching Pt states causes severe itching   . Penicillins Hives and Swelling    Swelling of the throat  . Pineapple Other (See Comments) and Swelling    Pt states causes her throat swelling Pt states causes her throat swelling   . Shellfish Allergy Rash and Swelling    Had reaction to cardiac cath dye  Had seizure ,rash ,itch Oct. 2012 Had reaction to cardiac cath dye  Had seizure ,rash ,itch Oct. 2012  Seizure during Cardiac Cath 2012  . 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 Pravastatin Other (See Comments)    Pt states she couldn't lift her legs to walk  . Shellfish-Derived Products   . Iodinated Diagnostic Agents Rash     Immunization History  Administered Date(s) Administered  . Fluad Quad(high Dose 65+) 02/19/2019  . Influenza, High Dose Seasonal PF 03/15/2016, 03/25/2017, 03/06/2018, 02/19/2019  . Pneumococcal Conjugate-13 04/16/2014  . Pneumococcal Polysaccharide-23 01/29/2011, 06/28/2015  . Tdap 08/07/2014    Outpatient Medications Prior to Visit  Medication Sig Dispense Refill  . albuterol (VENTOLIN HFA) 108 (90 Base) MCG/ACT inhaler Inhale 2 puffs into the lungs every 4 (four) hours as needed for wheezing or shortness of breath. 18 g 5  . arformoterol (BROVANA) 15 MCG/2ML NEBU Take 2 mLs (15 mcg total) by nebulization 2 (two) times daily. 120 mL 11  . aspirin 81 MG tablet Take 81 mg by mouth daily.    Marland Kitchen b complex vitamins capsule Take 1 capsule by mouth daily.    . budesonide (PULMICORT) 0.5 MG/2ML nebulizer solution Take 2 mLs (0.5 mg total) by nebulization 2 (two) times daily. This replaces  symbicort 120 mL 11  . carbamazepine (TEGRETOL) 200 MG tablet Take 200 mg by mouth 2 (two) times daily. 1 tab in the morning and 2 at night    . cetirizine (ZYRTEC) 10 MG tablet Take 10 mg by mouth daily.    . cyclobenzaprine (FLEXERIL) 5 MG tablet Take 5 mg by mouth daily.    . diclofenac sodium (VOLTAREN) 1 % GEL Apply to affected area four times daily as needed for pain.    Marland Kitchen dicyclomine (BENTYL) 10 MG capsule TAKE 1 CAPSULE (10 MG TOTAL) BY MOUTH 4 TIMES DAILY BEFORE MEALS AND NIGHTLY. AS NEEDED    . EPINEPHrine (EPIPEN 2-PAK) 0.3 mg/0.3 mL IJ SOAJ injection Inject 0.3 mg into the muscle Once PRN.    Marland Kitchen estradiol (ESTRACE) 1 MG tablet Take 1 mg by mouth daily.  3  . gabapentin (NEURONTIN) 600 MG tablet Take 600 mg by mouth 2 (two) times daily. 1 in the morning and 2 at bedtime    . levalbuterol (XOPENEX) 1.25 MG/3ML nebulizer solution Take 0.63 mg by nebulization every 6 (six) hours as needed for wheezing.     . levETIRAcetam (KEPPRA) 1000 MG tablet Take 1,000 mg by mouth 2 (two) times daily. 1 in the morning and 2 at night    . lidocaine (LIDODERM) 5 % APPLY PATCH TO PAINFUL AREA. PATCH MAY REMAIN IN PLACE FOR UP TO 12 HOURS IN A 24 HOUR PERIOD.    Marland Kitchen lisinopril (  PRINIVIL,ZESTRIL) 10 MG tablet Take 10 mg by mouth daily.     Marland Kitchen. loperamide (IMODIUM) 2 MG capsule Take 2 mg by mouth as needed for diarrhea or loose stools.    Marland Kitchen. LORazepam (ATIVAN) 1 MG tablet Take 1 mg by mouth 3 (three) times daily.    . meclizine (ANTIVERT) 25 MG tablet Take 25 mg by mouth 2 (two) times daily as needed.     . Melatonin 10 MG TABS Take 10 mg by mouth daily.    . mirtazapine (REMERON) 30 MG tablet every evening.    . montelukast (SINGULAIR) 10 MG tablet TAKE 1 TABLET BY MOUTH EVERYDAY AT BEDTIME 90 tablet 1  . Multiple Vitamin (MULTIVITAMIN) tablet Take by mouth.    Marland Kitchen. omeprazole (PRILOSEC) 40 MG capsule Take 40 mg by mouth 2 (two) times a day.    . ondansetron (ZOFRAN) 4 MG tablet Take 4 mg by mouth.    . OXYGEN  Inhale 3 L into the lungs at bedtime.    . Revefenacin (YUPELRI) 175 MCG/3ML SOLN Inhale 3 mLs into the lungs daily. 90 mL 11  . Roflumilast (DALIRESP) 250 MCG TABS Take 250 mcg by mouth daily for 28 days. SAMPLE PROVIDED - LOT [id number] EXP 11/08/2021 28 tablet 0  . salmeterol (SEREVENT DISKUS) 50 MCG/DOSE diskus inhaler Inhale 1 puff into the lungs 2 (two) times daily. 1 Inhaler 12   Facility-Administered Medications Prior to Visit  Medication Dose Route Frequency Provider Last Rate Last Admin  . omalizumab Geoffry Paradise(XOLAIR) injection 150 mg  150 mg Subcutaneous Q14 Days Alfonse SpruceGallagher, Joel Louis, MD   150 mg at 06/15/19 [id number]    Review of Systems  Constitutional: Negative for chills and fever.  HENT: Negative for congestion and sore throat.   Eyes: Negative.   Respiratory: Positive for cough, sputum production, shortness of breath and wheezing.   Cardiovascular: Negative for chest pain and leg swelling.  Gastrointestinal: Negative for heartburn, nausea and vomiting.  Skin: Negative for itching and rash.     Objective:   There were no vitals filed for this visit.   on  4L pulse O2 BMI Readings from Last 3 Encounters:  05/21/19 23.96 kg/m  04/09/19 23.96 kg/m  03/16/19 23.84 kg/m   Wt Readings from Last 3 Encounters:  05/21/19 157 lb 9.6 oz (71.5 kg)  04/09/19 157 lb 9.6 oz (71.5 kg)  03/16/19 156 lb 12.8 oz (71.1 kg)    Physical Exam- limited due to televisit.  No conversational dyspnea.  Tearful during the encounter.  Answering questions appropriately, normal speech.   CBC    Component Value Date/Time   WBC 9.7 09/23/2018 1021   RBC 4.77 09/23/2018 1021   HGB 14.7 09/23/2018 1021   HCT 44.3 09/23/2018 1021   PLT 380 09/23/2018 1021   MCV 93 09/23/2018 1021   MCH 30.8 09/23/2018 1021   MCHC 33.2 09/23/2018 1021   RDW 12.5 09/23/2018 1021   LYMPHSABS 2.7 09/23/2018 1021   EOSABS 0.2 09/23/2018 1021   BASOSABS 0.1 09/23/2018 1021     Chest Imaging- films reviewed: CT  chest 06/19/2018- centrilobular emphysema, upper lobe predominant.  Peripheral opacity in the lingula with distal bronchiectasis- suspicious for scar from a previous pneumonia.  Bleb in the lingula.  Mild dependent groundglass opacities in the bases.  No significant adenopathy.  Expiratory films confirm severe dynamic collapse of the trachea and proximal bronchi.  Pulmonary Functions Testing Results: No flowsheet data found.  Office-based biometry from Dr. Ellouise NewerGallagher's office-flow volume  loop with severe obstruction.  FEV1 0.75 L.  Echocardiogram: LVEF 60 to 65% with normal diastolic function.  Normal LA and right a.  Normal RV systolic function and size.  Normal valves.  LHC 01/14/2018: EF 65%, no significant coronary disease.    Assessment & Plan:     ICD-10-CM   1. DOE (dyspnea on exertion)  R06.00   2. Chronic respiratory failure with hypoxia (HCC)  J96.11   3. Asthma-COPD overlap syndrome ([place])  J44.9   4. Gastroesophageal reflux disease, unspecified whether esophagitis present  K21.9   Chronic dyspnea on exertion due to severe end-stage COPD and asthma overlap.  Seems like fairly normal variation for someone with severe obstructive lung disease.  She is lacking the features of an acute exacerbation or an acute infection. -Although she has requested prednisone, I am not convinced that this is going to improve her symptoms.  She has been on prednisone frequently, and I am very concerned about potential toxicity.  In light of her GI symptoms and very severe GERD, prednisone could be making her breathing worse rather than better if uncontrolled GERD is contributing to her symptoms. -Increase use of short acting bronchodilators to improve symptoms -Continue all long-acting bronchodilators -Continue Roflumilast as prescribed -Continue Xolair per allergist -Continue regular physical activity, but I encouraged her to adjust her activity and expectations to accept walking slightly less than her usual  on days when she is not feeling as well. -Continue COVID-19 precautions-social distancing, mask wearing, handwashing.  She has been walking with friends outside not wearing a mask, but they all wear masks to protect her.  She understands the importance of wearing mask anytime that she is inside and that Covid is very prevalent in the community right now, increasing her risk of exposure. -Continue allergic rhinitis treatment  Chronic hypoxic respiratory failure-requires 3 L of oxygen with sleep and 4 L with walking -Continue supplemental oxygen as needed.  We discussed that if her oxygen needs increase, which I anticipate they will over time, she will be unlikely to continue using a portable concentrator she will have to switch from pulsed to continuous oxygen.  Uncontrolled GERD, history of esophageal strictures -Continue omeprazole twice daily.  No baseline EKG to determine if adding an H2 blocker would be safe. -Strongly encouraged her to follow-up with GI.  Although she is high risk for sedating procedures, uncontrolled acid reflux could be worsening her pulmonary status, and this is a risk-benefit decision that likely would favor the low risk procedure of an EGD if it would contribute to better control her reflux.  Follow-up with televisit in 1 week.   Greater than 40 minutes of time spent in talking with the patient over the phone, reviewing her records, and charting.    Current Outpatient Medications:  .  albuterol (VENTOLIN HFA) 108 (90 Base) MCG/ACT inhaler, Inhale 2 puffs into the lungs every 4 (four) hours as needed for wheezing or shortness of breath., Disp: 18 g, Rfl: 5 .  arformoterol (BROVANA) 15 MCG/2ML NEBU, Take 2 mLs (15 mcg total) by nebulization 2 (two) times daily., Disp: 120 mL, Rfl: 11 .  aspirin 81 MG tablet, Take 81 mg by mouth daily., Disp: , Rfl:  .  b complex vitamins capsule, Take 1 capsule by mouth daily., Disp: , Rfl:  .  budesonide (PULMICORT) 0.5 MG/2ML  nebulizer solution, Take 2 mLs (0.5 mg total) by nebulization 2 (two) times daily. This replaces symbicort, Disp: 120 mL, Rfl: 11 .  carbamazepine (  TEGRETOL) 200 MG tablet, Take 200 mg by mouth 2 (two) times daily. 1 tab in the morning and 2 at night, Disp: , Rfl:  .  cetirizine (ZYRTEC) 10 MG tablet, Take 10 mg by mouth daily., Disp: , Rfl:  .  cyclobenzaprine (FLEXERIL) 5 MG tablet, Take 5 mg by mouth daily., Disp: , Rfl:  .  diclofenac sodium (VOLTAREN) 1 % GEL, Apply to affected area four times daily as needed for pain., Disp: , Rfl:  .  dicyclomine (BENTYL) 10 MG capsule, TAKE 1 CAPSULE (10 MG TOTAL) BY MOUTH 4 TIMES DAILY BEFORE MEALS AND NIGHTLY. AS NEEDED, Disp: , Rfl:  .  EPINEPHrine (EPIPEN 2-PAK) 0.3 mg/0.3 mL IJ SOAJ injection, Inject 0.3 mg into the muscle Once PRN., Disp: , Rfl:  .  estradiol (ESTRACE) 1 MG tablet, Take 1 mg by mouth daily., Disp: , Rfl: 3 .  gabapentin (NEURONTIN) 600 MG tablet, Take 600 mg by mouth 2 (two) times daily. 1 in the morning and 2 at bedtime, Disp: , Rfl:  .  levalbuterol (XOPENEX) 1.25 MG/3ML nebulizer solution, Take 0.63 mg by nebulization every 6 (six) hours as needed for wheezing. , Disp: , Rfl:  .  levETIRAcetam (KEPPRA) 1000 MG tablet, Take 1,000 mg by mouth 2 (two) times daily. 1 in the morning and 2 at night, Disp: , Rfl:  .  lidocaine (LIDODERM) 5 %, APPLY PATCH TO PAINFUL AREA. PATCH MAY REMAIN IN PLACE FOR UP TO 12 HOURS IN A 24 HOUR PERIOD., Disp: , Rfl:  .  lisinopril (PRINIVIL,ZESTRIL) 10 MG tablet, Take 10 mg by mouth daily. , Disp: , Rfl:  .  loperamide (IMODIUM) 2 MG capsule, Take 2 mg by mouth as needed for diarrhea or loose stools., Disp: , Rfl:  .  LORazepam (ATIVAN) 1 MG tablet, Take 1 mg by mouth 3 (three) times daily., Disp: , Rfl:  .  meclizine (ANTIVERT) 25 MG tablet, Take 25 mg by mouth 2 (two) times daily as needed. , Disp: , Rfl:  .  Melatonin 10 MG TABS, Take 10 mg by mouth daily., Disp: , Rfl:  .  mirtazapine (REMERON) 30 MG  tablet, every evening., Disp: , Rfl:  .  montelukast (SINGULAIR) 10 MG tablet, TAKE 1 TABLET BY MOUTH EVERYDAY AT BEDTIME, Disp: 90 tablet, Rfl: 1 .  Multiple Vitamin (MULTIVITAMIN) tablet, Take by mouth., Disp: , Rfl:  .  omeprazole (PRILOSEC) 40 MG capsule, Take 40 mg by mouth 2 (two) times a day., Disp: , Rfl:  .  ondansetron (ZOFRAN) 4 MG tablet, Take 4 mg by mouth., Disp: , Rfl:  .  OXYGEN, Inhale 3 L into the lungs at bedtime., Disp: , Rfl:  .  Revefenacin (YUPELRI) 175 MCG/3ML SOLN, Inhale 3 mLs into the lungs daily., Disp: 90 mL, Rfl: 11 .  Roflumilast (DALIRESP) 250 MCG TABS, Take 250 mcg by mouth daily for 28 days. SAMPLE PROVIDED - LOT [id number] EXP 11/08/2021, Disp: 28 tablet, Rfl: 0 .  salmeterol (SEREVENT DISKUS) 50 MCG/DOSE diskus inhaler, Inhale 1 puff into the lungs 2 (two) times daily., Disp: 1 Inhaler, Rfl: 12  Current Facility-Administered Medications:  .  omalizumab Geoffry Paradise) injection 150 mg, 150 mg, Subcutaneous, Q14 Days, Alfonse Spruce, MD, 150 mg at 06/15/19 0160   Steffanie Dunn, DO [facility] 06/15/2019 5:05 PM

## 2019-06-16 DIAGNOSIS — J4540 Moderate persistent asthma, uncomplicated: Secondary | ICD-10-CM | POA: Diagnosis not present

## 2019-06-17 ENCOUNTER — Telehealth: Payer: Self-pay

## 2019-06-17 ENCOUNTER — Encounter: Payer: Self-pay | Admitting: Family Medicine

## 2019-06-17 ENCOUNTER — Ambulatory Visit (INDEPENDENT_AMBULATORY_CARE_PROVIDER_SITE_OTHER): Payer: Medicare Other | Admitting: Family Medicine

## 2019-06-17 ENCOUNTER — Other Ambulatory Visit: Payer: Self-pay

## 2019-06-17 DIAGNOSIS — J449 Chronic obstructive pulmonary disease, unspecified: Secondary | ICD-10-CM

## 2019-06-17 DIAGNOSIS — Z9981 Dependence on supplemental oxygen: Secondary | ICD-10-CM

## 2019-06-17 DIAGNOSIS — J3089 Other allergic rhinitis: Secondary | ICD-10-CM

## 2019-06-17 DIAGNOSIS — J4550 Severe persistent asthma, uncomplicated: Secondary | ICD-10-CM | POA: Diagnosis not present

## 2019-06-17 DIAGNOSIS — K219 Gastro-esophageal reflux disease without esophagitis: Secondary | ICD-10-CM

## 2019-06-17 MED ORDER — LEVOCETIRIZINE DIHYDROCHLORIDE 5 MG PO TABS: 5.0000 mg | ORAL_TABLET | Freq: Every day | ORAL | 5 refills | Status: DC | PRN

## 2019-06-17 NOTE — Telephone Encounter (Signed)
Dewayne Hatch knows this patient very well. Does she had a phone/video visit open today?  Malachi Bonds, MD [facility]

## 2019-06-17 NOTE — Patient Instructions (Addendum)
Moderate persistent asthma with COPD overlap Continue Xolair 150 mg once every 2 weeks Continue with your O2 at 2L up to 4L as previously prescribed Continue Budesonide 0.5 twice a day + Perforomist 20 mg twice a day + Yupelri once daily via nebulizer to prevent cough or wheeze Continue montelukast 10 mg once a day to prevent cough or wheeze Continue Xopenex (levalbuterol) + Atrovent (ipratropium) mixed together every 6 hours as needed via nebulizer or instead albuterol 2 puffs every 4 hours as needed for cough or wheeze  Allergic rhinitis Stop cetirizine and begin Xyzal once a day as needed for a runny nose Begin saline nasal gel to relieve dry nostrils as needed Continue montelukast 10 mg once a day as above Continue saline spray as needed for nasal conmgestion For thick post nasal drainage, begin Mucinex [phone] mg twice a day If you develop discolored mucus or your symptoms worsen, call the clinic.   Reflux Continue omeprazole 40 mg twice a day as previously prescribed Continue dietary and lifestyle modifications as you have been  Follow up in 2 months or sooner if needed

## 2019-06-17 NOTE — Telephone Encounter (Signed)
Patient called to see if there is anything we could give her for her sinuses. She states, "feels like my head is busting". Thinks she might have a sinus infection or head cold. She is using sinus rinse daily; today it's burning her nose. Is also taking Singulair and Zyrtec. Does not want any prednisone called out. Please advise.

## 2019-06-17 NOTE — Telephone Encounter (Signed)
We have her on the phone now. Thank you

## 2019-06-17 NOTE — Progress Notes (Signed)
RE: Anne Rich MRN: [id number] DOB: Oct 16, 1950 Date of Telemedicine Visit: 06/17/2019  Referring provider: No ref. provider found Primary care provider: Patient, No Pcp Per  Chief Complaint: Sinusitis (pressure in sinus area.  Patient lives at [facility] on [address].  Staff is using a aerosol spray to clean for COVID. )   Telemedicine Follow Up Visit via Telephone: I connected with Anne Rich for a follow up on 06/17/19 by telephone and verified that I am speaking with the correct person using two identifiers.   I discussed the limitations, risks, security and privacy concerns of performing an evaluation and management service by telephone and the availability of in person appointments. I also discussed with the patient that there may be a patient responsible charge related to this service. The patient expressed understanding and agreed to proceed.  Patient is at home Provider is at the office.  Visit start time: 1:59 Visit end time: 2:30 Insurance consent/check in by: [facility] Medical consent and medical assistant/nurse: Clyda Greener  History of Present Illness: She is a 69 y.o. female, who is being followed for asthma/COPD overlap, oxygen therapy, allergic rhinitis, and reflux. Her previous allergy office visit was on 03/16/2019 with Thermon Leyland, FNP. At today's visit, she reports that she developed nasal congestion, sinus pressure, and thick post nasal drainage 2 days ago which she believes was aggravated by the wind and then further aggravated by disinfectant spray in her home. She denies fever and any nasal drainage at this time. 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 She is currently taking cetirizine which wears off after several hours and she has been taking a second cetirizine with modest relief. She began taking Mucinex twice a day yesterday. She reports her breathing is average and continues montelukast 10 mg  once a day along with budesonide twice a day, Perforomist twice a day and Yupelri once a day. She continues oxygen 4 liters via nasal canula during the daytime and 3 LNC at night. Reflux is reported as well controlled. Her current medications are listed in the chart.    Assessment and Plan: [name] is a 69 y.o. female with: Patient Instructions  Moderate persistent asthma with COPD overlap Continue Xolair 150 mg once every 2 weeks Continue with your O2 at 2L up to 4L as previously prescribed Continue Budesonide 0.5 twice a day + Perforomist 20 mg twice a day + Yupelri once daily via nebulizer to prevent cough or wheeze Continue montelukast 10 mg once a day to prevent cough or wheeze Continue Xopenex (levalbuterol) + Atrovent (ipratropium) mixed together every 6 hours as needed via nebulizer or instead albuterol 2 puffs every 4 hours as needed for cough or wheeze  Allergic rhinitis Stop cetirizine and begin Xyzal once a day as needed for a runny nose Begin saline nasal gel to relieve dry nostrils as needed Continue montelukast 10 mg once a day as above Continue saline spray as needed for nasal conmgestion For thick post nasal drainage, begin Mucinex [phone] mg twice a day If you develop discolored mucus or your symptoms worsen, call the clinic.   Reflux Continue omeprazole 40 mg twice a day as previously prescribed Continue dietary and lifestyle modifications as you have been  Follow up in 2 months or sooner if needed   Return in about 2 months (around 08/15/2019), or if symptoms worsen or fail to improve.  Meds ordered this encounter  Medications  .  levocetirizine (XYZAL) 5 MG tablet    Sig: Take 1 tablet (5 mg total) by mouth daily as needed for allergies (for runny nose).    Dispense:  30 tablet    Refill:  5    Medication List:  Current Outpatient Medications  Medication Sig Dispense Refill  . albuterol (VENTOLIN HFA) 108 (90 Base) MCG/ACT inhaler Inhale 2 puffs into the  lungs every 4 (four) hours as needed for wheezing or shortness of breath. 18 g 5  . arformoterol (BROVANA) 15 MCG/2ML NEBU Take 2 mLs (15 mcg total) by nebulization 2 (two) times daily. 120 mL 11  . aspirin 81 MG tablet Take 81 mg by mouth daily.    Marland Kitchen b complex vitamins capsule Take 1 capsule by mouth daily.    . budesonide (PULMICORT) 0.5 MG/2ML nebulizer solution Take 2 mLs (0.5 mg total) by nebulization 2 (two) times daily. This replaces symbicort 120 mL 11  . carbamazepine (TEGRETOL) 200 MG tablet Take 200 mg by mouth 2 (two) times daily. 1 tab in the morning and 2 at night    . cetirizine (ZYRTEC) 10 MG tablet Take 10 mg by mouth daily.    . cyclobenzaprine (FLEXERIL) 5 MG tablet Take 5 mg by mouth daily.    . diclofenac sodium (VOLTAREN) 1 % GEL Apply to affected area four times daily as needed for pain.    Marland Kitchen dicyclomine (BENTYL) 10 MG capsule TAKE 1 CAPSULE (10 MG TOTAL) BY MOUTH 4 TIMES DAILY BEFORE MEALS AND NIGHTLY. AS NEEDED    . EPINEPHrine (EPIPEN 2-PAK) 0.3 mg/0.3 mL IJ SOAJ injection Inject 0.3 mg into the muscle Once PRN.    Marland Kitchen estradiol (ESTRACE) 1 MG tablet Take 1 mg by mouth daily.  3  . gabapentin (NEURONTIN) 600 MG tablet Take 600 mg by mouth 2 (two) times daily. 1 in the morning and 2 at bedtime    . hydrOXYzine (VISTARIL) 25 MG capsule Take 25 mg by mouth 2 (two) times daily.    Marland Kitchen levalbuterol (XOPENEX) 1.25 MG/3ML nebulizer solution Take 0.63 mg by nebulization every 6 (six) hours as needed for wheezing.     . levETIRAcetam (KEPPRA) 1000 MG tablet Take 1,000 mg by mouth 2 (two) times daily. 1 in the morning and 2 at night    . lidocaine (LIDODERM) 5 % APPLY PATCH TO PAINFUL AREA. PATCH MAY REMAIN IN PLACE FOR UP TO 12 HOURS IN A 24 HOUR PERIOD.    Marland Kitchen lisinopril (PRINIVIL,ZESTRIL) 10 MG tablet Take 10 mg by mouth daily.     Marland Kitchen loperamide (IMODIUM) 2 MG capsule Take 2 mg by mouth as needed for diarrhea or loose stools.    Marland Kitchen LORazepam (ATIVAN) 1 MG tablet Take 1 mg by mouth 3  (three) times daily.    . meclizine (ANTIVERT) 25 MG tablet Take 25 mg by mouth 2 (two) times daily as needed.     . Melatonin 10 MG TABS Take 10 mg by mouth daily.    . mirtazapine (REMERON) 30 MG tablet every evening.    . montelukast (SINGULAIR) 10 MG tablet TAKE 1 TABLET BY MOUTH EVERYDAY AT BEDTIME 90 tablet 1  . Multiple Vitamin (MULTIVITAMIN) tablet Take by mouth.    Marland Kitchen omeprazole (PRILOSEC) 40 MG capsule Take 40 mg by mouth 2 (two) times a day.    . ondansetron (ZOFRAN) 4 MG tablet Take 4 mg by mouth.    . OXYGEN Inhale 3 L into the lungs at bedtime.    Marland Kitchen  Revefenacin (YUPELRI) 175 MCG/3ML SOLN Inhale 3 mLs into the lungs daily. 90 mL 11  . Roflumilast (DALIRESP) 250 MCG TABS Take 250 mcg by mouth daily for 28 days. SAMPLE PROVIDED - LOT [id number] EXP 11/08/2021 28 tablet 0  . salmeterol (SEREVENT DISKUS) 50 MCG/DOSE diskus inhaler Inhale 1 puff into the lungs 2 (two) times daily. 1 Inhaler 12  . levocetirizine (XYZAL) 5 MG tablet Take 1 tablet (5 mg total) by mouth daily as needed for allergies (for runny nose). 30 tablet 5  . promethazine-dextromethorphan (PROMETHAZINE-DM) 6.25-15 MG/5ML syrup SMARTSIG:2.5 Milliliter(s) By Mouth 4 Times Daily PRN     Current Facility-Administered Medications  Medication Dose Route Frequency Provider Last Rate Last Admin  . omalizumab Geoffry Paradise) injection 150 mg  150 mg Subcutaneous Q14 Days Alfonse Spruce, MD   150 mg at 06/15/19 0175   Allergies: Allergies  Allergen Reactions  . Iodine Itching, Rash and Swelling    IV contrast   . Iodine-131 Itching and Swelling    Pt states causes severe itching Pt states causes severe itching   . Penicillins Hives and Swelling    Swelling of the throat  . Pineapple Other (See Comments) and Swelling    Pt states causes her throat swelling Pt states causes her throat swelling   . Shellfish Allergy Rash and Swelling    Had reaction to cardiac cath dye  Had seizure ,rash ,itch Oct. 2012 Had reaction  to cardiac cath dye  Had seizure ,rash ,itch Oct. 2012  Seizure during Cardiac Cath 2012  . 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 Pravastatin Other (See Comments)    Pt states she couldn't lift her legs to walk  . Shellfish-Derived Products   . Iodinated Diagnostic Agents Rash   I reviewed her past medical history, social history, family history, and environmental history and no significant changes have been reported from previous visit on 03/16/2019.  Objective: Physical Exam  Constitutional: She appears well-developed.   Not obtained as encounter was done via telephone.   Previous notes and tests were reviewed.  I discussed the assessment and treatment plan with the patient. The patient was provided an opportunity to ask questions and all were answered. The patient agreed with the plan and demonstrated an understanding of the instructions.   The patient was advised to call back or seek an in-person evaluation if the symptoms worsen or if the condition fails to improve as anticipated.  I provided 31 minutes of non-face-to-face time during this encounter.  It was my pleasure to participate in [facility] care today. Please feel free to contact me with any questions or concerns.   Sincerely,  Thermon Leyland, FNP   I have provided oversight concerning Thermon Leyland' evaluation and treatment of this patient's health issues addressed during today's encounter. I agree with the assessment and therapeutic plan as outlined in the note.   Thank you for the opportunity to care for this patient.  Please do not hesitate to contact me with questions.  Tonette Bihari, M.D.  [facility] [address] [place], [place] [address] [phone]

## 2019-06-17 NOTE — Telephone Encounter (Signed)
Called and scheduled patient for a phone visit with Thurston Hole for this afternoon.

## 2019-06-19 ENCOUNTER — Other Ambulatory Visit: Payer: Self-pay

## 2019-06-19 ENCOUNTER — Ambulatory Visit (INDEPENDENT_AMBULATORY_CARE_PROVIDER_SITE_OTHER): Payer: Medicare Other | Admitting: Allergy & Immunology

## 2019-06-19 ENCOUNTER — Ambulatory Visit: Payer: Medicare Other

## 2019-06-19 ENCOUNTER — Telehealth: Payer: Self-pay

## 2019-06-19 DIAGNOSIS — J3089 Other allergic rhinitis: Secondary | ICD-10-CM

## 2019-06-19 DIAGNOSIS — J4551 Severe persistent asthma with (acute) exacerbation: Secondary | ICD-10-CM

## 2019-06-19 DIAGNOSIS — Z9981 Dependence on supplemental oxygen: Secondary | ICD-10-CM

## 2019-06-19 DIAGNOSIS — K219 Gastro-esophageal reflux disease without esophagitis: Secondary | ICD-10-CM | POA: Diagnosis not present

## 2019-06-19 DIAGNOSIS — Z20822 Contact with and (suspected) exposure to covid-19: Secondary | ICD-10-CM

## 2019-06-19 NOTE — Telephone Encounter (Signed)
OK I will route to [place] as well to keep her in the loop since she is on call this weekend.   Malachi Bonds, MD [facility]

## 2019-06-19 NOTE — Telephone Encounter (Signed)
Patient has been added on today from a same day tele visit.  Thanks
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
## 2019-06-19 NOTE — Telephone Encounter (Signed)
Confirmed encounter with Dr. Dellis Anes via [employer] Teams chat.  He will have [facility] call patient and set up a telehealth visit within the next hour.

## 2019-06-19 NOTE — Telephone Encounter (Signed)
PT called again to let us know she just took the test and wants to make sure Dr Dellis Anes calls to check on her breathing during the weekend.

## 2019-06-19 NOTE — Telephone Encounter (Signed)
Patient called.  She is very upset and crying.  Her neighbor was diagnosed with COVID a few minutes ago.  He is a very close friend that lives in the same assisted living center as her.  Patient has been around him on a daily and frequent basis without wearing a mask.  Patient developed a cough last night.  Patient would like to speak with Dr. Dellis Anes or Thurston Hole.  She is afraid she is going to die if she gets COVID.  Please call patient.  She wants our provider to call her ASAP.  Patient is waiting by her phone.  The number is [phone].  (I will call Dr. Dellis Anes now to inform of this encounter.)

## 2019-06-20 ENCOUNTER — Inpatient Hospital Stay (HOSPITAL_COMMUNITY): Payer: Medicare Other

## 2019-06-20 ENCOUNTER — Emergency Department (HOSPITAL_COMMUNITY): Payer: Medicare Other

## 2019-06-20 ENCOUNTER — Telehealth: Payer: Self-pay | Admitting: Pulmonary Disease

## 2019-06-20 ENCOUNTER — Encounter (HOSPITAL_COMMUNITY): Payer: Self-pay | Admitting: Internal Medicine

## 2019-06-20 ENCOUNTER — Telehealth: Payer: Self-pay | Admitting: Allergy

## 2019-06-20 ENCOUNTER — Inpatient Hospital Stay (HOSPITAL_COMMUNITY)
Admission: EM | Admit: 2019-06-20 | Discharge: 2019-06-22 | DRG: 177 | Disposition: A | Discharge status: Home Health | Payer: Medicare Other | Attending: Internal Medicine | Admitting: Internal Medicine

## 2019-06-20 ENCOUNTER — Encounter: Payer: Self-pay | Admitting: Allergy & Immunology

## 2019-06-20 DIAGNOSIS — Z79899 Other long term (current) drug therapy: Secondary | ICD-10-CM

## 2019-06-20 DIAGNOSIS — I639 Cerebral infarction, unspecified: Secondary | ICD-10-CM | POA: Diagnosis present

## 2019-06-20 DIAGNOSIS — J9611 Chronic respiratory failure with hypoxia: Secondary | ICD-10-CM

## 2019-06-20 DIAGNOSIS — F419 Anxiety disorder, unspecified: Secondary | ICD-10-CM | POA: Diagnosis present

## 2019-06-20 DIAGNOSIS — Z888 Allergy status to other drugs, medicaments and biological substances status: Secondary | ICD-10-CM | POA: Diagnosis not present

## 2019-06-20 DIAGNOSIS — J441 Chronic obstructive pulmonary disease with (acute) exacerbation: Secondary | ICD-10-CM | POA: Diagnosis present

## 2019-06-20 DIAGNOSIS — K580 Irritable bowel syndrome with diarrhea: Secondary | ICD-10-CM | POA: Diagnosis present

## 2019-06-20 DIAGNOSIS — Z91041 Radiographic dye allergy status: Secondary | ICD-10-CM

## 2019-06-20 DIAGNOSIS — J45901 Unspecified asthma with (acute) exacerbation: Secondary | ICD-10-CM | POA: Diagnosis not present

## 2019-06-20 DIAGNOSIS — Z90710 Acquired absence of both cervix and uterus: Secondary | ICD-10-CM | POA: Diagnosis not present

## 2019-06-20 DIAGNOSIS — I10 Essential (primary) hypertension: Secondary | ICD-10-CM | POA: Diagnosis present

## 2019-06-20 DIAGNOSIS — Z8673 Personal history of transient ischemic attack (TIA), and cerebral infarction without residual deficits: Secondary | ICD-10-CM

## 2019-06-20 DIAGNOSIS — J432 Centrilobular emphysema: Secondary | ICD-10-CM | POA: Diagnosis present

## 2019-06-20 DIAGNOSIS — U071 COVID-19: Principal | ICD-10-CM

## 2019-06-20 DIAGNOSIS — Z9981 Dependence on supplemental oxygen: Secondary | ICD-10-CM | POA: Diagnosis not present

## 2019-06-20 DIAGNOSIS — Z7982 Long term (current) use of aspirin: Secondary | ICD-10-CM | POA: Diagnosis not present

## 2019-06-20 DIAGNOSIS — Z880 Allergy status to penicillin: Secondary | ICD-10-CM | POA: Diagnosis not present

## 2019-06-20 DIAGNOSIS — Z87891 Personal history of nicotine dependence: Secondary | ICD-10-CM | POA: Diagnosis not present

## 2019-06-20 DIAGNOSIS — G8929 Other chronic pain: Secondary | ICD-10-CM | POA: Diagnosis present

## 2019-06-20 DIAGNOSIS — Z91013 Allergy to seafood: Secondary | ICD-10-CM

## 2019-06-20 DIAGNOSIS — J1282 Pneumonia due to coronavirus disease 2019: Secondary | ICD-10-CM | POA: Diagnosis present

## 2019-06-20 DIAGNOSIS — Z7951 Long term (current) use of inhaled steroids: Secondary | ICD-10-CM

## 2019-06-20 DIAGNOSIS — J449 Chronic obstructive pulmonary disease, unspecified: Secondary | ICD-10-CM | POA: Diagnosis present

## 2019-06-20 LAB — TRIGLYCERIDES: Triglycerides: 83 mg/dL (ref <150)

## 2019-06-20 LAB — CBC WITH DIFFERENTIAL/PLATELET
Abs Immature Granulocytes: 0.04 10*3/uL (ref 0.00–0.07)
Basophils Absolute: 0 10*3/uL (ref 0.0–0.1)
Basophils Relative: 0 %
Eosinophils Absolute: 0 10*3/uL (ref 0.0–0.5)
Eosinophils Relative: 0 %
HCT: 37.9 % (ref 36.0–46.0)
Hemoglobin: 12.2 g/dL (ref 12.0–15.0)
Immature Granulocytes: 0 %
Lymphocytes Relative: 5 %
Lymphs Abs: 0.5 10*3/uL — ABNORMAL LOW (ref 0.7–4.0)
MCH: 30.4 pg (ref 26.0–34.0)
MCHC: 32.2 g/dL (ref 30.0–36.0)
MCV: 94.5 fL (ref 80.0–100.0)
Monocytes Absolute: 0.3 10*3/uL (ref 0.1–1.0)
Monocytes Relative: 3 %
Neutro Abs: 9.3 10*3/uL — ABNORMAL HIGH (ref 1.7–7.7)
Neutrophils Relative %: 92 %
Platelets: 266 10*3/uL (ref 150–400)
RBC: 4.01 MIL/uL (ref 3.87–5.11)
RDW: 12.9 % (ref 11.5–15.5)
WBC: 10.1 10*3/uL (ref 4.0–10.5)
nRBC: 0 % (ref 0.0–0.2)

## 2019-06-20 LAB — LACTATE DEHYDROGENASE: LDH: 180 U/L (ref 98–192)

## 2019-06-20 LAB — C-REACTIVE PROTEIN: CRP: 6.6 mg/dL — ABNORMAL HIGH (ref <1.0)

## 2019-06-20 LAB — BASIC METABOLIC PANEL
Anion gap: 10 (ref 5–15)
BUN: 8 mg/dL (ref 8–23)
CO2: 27 mmol/L (ref 22–32)
Calcium: 8.2 mg/dL — ABNORMAL LOW (ref 8.9–10.3)
Chloride: 97 mmol/L — ABNORMAL LOW (ref 98–111)
Creatinine, Ser: 0.58 mg/dL (ref 0.44–1.00)
GFR calc Af Amer: >60 mL/min (ref >60)
GFR calc non Af Amer: >60 mL/min (ref >60)
Glucose, Bld: 101 mg/dL — ABNORMAL HIGH (ref 70–99)
Potassium: 4 mmol/L (ref 3.5–5.1)
Sodium: 134 mmol/L — ABNORMAL LOW (ref 135–145)

## 2019-06-20 LAB — POC SARS CORONAVIRUS 2 AG -  ED: SARS Coronavirus 2 Ag: POSITIVE — AB

## 2019-06-20 LAB — PROCALCITONIN: Procalcitonin: <0.1 ng/mL

## 2019-06-20 LAB — NOVEL CORONAVIRUS, NAA: SARS-CoV-2, NAA: DETECTED — AB

## 2019-06-20 LAB — FIBRINOGEN: Fibrinogen: 513 mg/dL — ABNORMAL HIGH (ref 210–475)

## 2019-06-20 LAB — D-DIMER, QUANTITATIVE: D-Dimer, Quant: 0.84 ug/mL-FEU — ABNORMAL HIGH (ref 0.00–0.50)

## 2019-06-20 LAB — FERRITIN: Ferritin: 114 ng/mL (ref 11–307)

## 2019-06-20 LAB — LACTIC ACID, PLASMA: Lactic Acid, Venous: 0.9 mmol/L (ref 0.5–1.9)

## 2019-06-20 MED ORDER — GABAPENTIN 300 MG PO CAPS
600.0000 mg | ORAL_CAPSULE | Freq: Every day | ORAL | Status: DC
  Administered 2019-06-21 – 2019-06-22 (×2): 600 mg via ORAL
  Filled 2019-06-20 (×2): qty 2

## 2019-06-20 MED ORDER — CARBAMAZEPINE 200 MG PO TABS
200.0000 mg | ORAL_TABLET | Freq: Two times a day (BID) | ORAL | Status: DC
  Administered 2019-06-20 – 2019-06-21 (×2): 200 mg via ORAL
  Filled 2019-06-20 (×3): qty 1

## 2019-06-20 MED ORDER — ASPIRIN 81 MG PO CHEW
81.0000 mg | CHEWABLE_TABLET | Freq: Every day | ORAL | Status: DC
  Administered 2019-06-21 – 2019-06-22 (×2): 81 mg via ORAL
  Filled 2019-06-20: qty 1

## 2019-06-20 MED ORDER — LORAZEPAM 0.5 MG PO TABS
1.0000 mg | ORAL_TABLET | Freq: Three times a day (TID) | ORAL | Status: DC
  Administered 2019-06-20 – 2019-06-22 (×6): 1 mg via ORAL
  Filled 2019-06-20: qty 2
  Filled 2019-06-20: qty 1
  Filled 2019-06-20 (×6): qty 2

## 2019-06-20 MED ORDER — ONDANSETRON HCL 4 MG/2ML IJ SOLN
4.0000 mg | Freq: Four times a day (QID) | INTRAMUSCULAR | Status: DC | PRN
  Administered 2019-06-20 – 2019-06-21 (×3): 4 mg via INTRAVENOUS
  Filled 2019-06-20 (×3): qty 2

## 2019-06-20 MED ORDER — ARFORMOTEROL TARTRATE 15 MCG/2ML IN NEBU: 15.0000 ug | INHALATION_SOLUTION | Freq: Two times a day (BID) | RESPIRATORY_TRACT | Status: DC

## 2019-06-20 MED ORDER — CYCLOBENZAPRINE HCL 10 MG PO TABS: 5.0000 mg | ORAL_TABLET | Freq: Every day | ORAL | Status: DC

## 2019-06-20 MED ORDER — HYDROCODONE-ACETAMINOPHEN 5-325 MG PO TABS: 1.0000 | ORAL_TABLET | ORAL | Status: DC | PRN

## 2019-06-20 MED ORDER — MONTELUKAST SODIUM 10 MG PO TABS
10.0000 mg | ORAL_TABLET | Freq: Every day | ORAL | Status: DC
  Administered 2019-06-20 – 2019-06-21 (×2): 10 mg via ORAL
  Filled 2019-06-20 (×3): qty 1

## 2019-06-20 MED ORDER — LISINOPRIL 10 MG PO TABS
10.0000 mg | ORAL_TABLET | Freq: Every day | ORAL | Status: DC
  Administered 2019-06-20 – 2019-06-21 (×2): 10 mg via ORAL
  Filled 2019-06-20 (×2): qty 1

## 2019-06-20 MED ORDER — GABAPENTIN 300 MG PO CAPS
1200.0000 mg | ORAL_CAPSULE | Freq: Every day | ORAL | Status: DC
  Administered 2019-06-20 – 2019-06-21 (×2): 1200 mg via ORAL
  Filled 2019-06-20 (×2): qty 4

## 2019-06-20 MED ORDER — SENNA 8.6 MG PO TABS
1.0000 | ORAL_TABLET | Freq: Two times a day (BID) | ORAL | Status: DC
  Administered 2019-06-20: 8.6 mg via ORAL
  Filled 2019-06-20 (×3): qty 1

## 2019-06-20 MED ORDER — MECLIZINE HCL 25 MG PO TABS
25.0000 mg | ORAL_TABLET | Freq: Two times a day (BID) | ORAL | Status: DC | PRN
  Filled 2019-06-20: qty 1

## 2019-06-20 MED ORDER — ACETAMINOPHEN 325 MG PO TABS
650.0000 mg | ORAL_TABLET | Freq: Four times a day (QID) | ORAL | Status: DC | PRN
  Administered 2019-06-22: 650 mg via ORAL
  Filled 2019-06-20: qty 2

## 2019-06-20 MED ORDER — MIRTAZAPINE 15 MG PO TABS
30.0000 mg | ORAL_TABLET | Freq: Every day | ORAL | Status: DC
  Administered 2019-06-20 – 2019-06-21 (×2): 30 mg via ORAL
  Filled 2019-06-20 (×2): qty 2

## 2019-06-20 MED ORDER — LEVALBUTEROL TARTRATE 45 MCG/ACT IN AERO
2.0000 | INHALATION_SPRAY | Freq: Four times a day (QID) | RESPIRATORY_TRACT | Status: DC | PRN
  Filled 2019-06-20 (×2): qty 15

## 2019-06-20 MED ORDER — LISINOPRIL 10 MG PO TABS: 10.0000 mg | ORAL_TABLET | Freq: Every day | ORAL | Status: DC

## 2019-06-20 MED ORDER — LORATADINE 10 MG PO TABS
10.0000 mg | ORAL_TABLET | Freq: Every day | ORAL | Status: DC
  Administered 2019-06-21: 10 mg via ORAL
  Filled 2019-06-20 (×2): qty 1

## 2019-06-20 MED ORDER — ONDANSETRON HCL 4 MG PO TABS: 4.0000 mg | ORAL_TABLET | Freq: Four times a day (QID) | ORAL | Status: DC | PRN

## 2019-06-20 MED ORDER — DICYCLOMINE HCL 10 MG PO CAPS
10.0000 mg | ORAL_CAPSULE | Freq: Three times a day (TID) | ORAL | Status: DC
  Administered 2019-06-22 (×2): 10 mg via ORAL
  Filled 2019-06-20 (×8): qty 1

## 2019-06-20 MED ORDER — SODIUM CHLORIDE 0.9 % IV SOLN
100.0000 mg | Freq: Every day | INTRAVENOUS | Status: DC
  Administered 2019-06-21 – 2019-06-22 (×2): 100 mg via INTRAVENOUS
  Filled 2019-06-20 (×2): qty 20

## 2019-06-20 MED ORDER — PANTOPRAZOLE SODIUM 40 MG PO TBEC
40.0000 mg | DELAYED_RELEASE_TABLET | Freq: Every day | ORAL | Status: DC
  Administered 2019-06-20 – 2019-06-22 (×3): 40 mg via ORAL
  Filled 2019-06-20 (×2): qty 1

## 2019-06-20 MED ORDER — ACETAMINOPHEN 650 MG RE SUPP: 650.0000 mg | Freq: Four times a day (QID) | RECTAL | Status: DC | PRN

## 2019-06-20 MED ORDER — LEVETIRACETAM 500 MG PO TABS
1000.0000 mg | ORAL_TABLET | Freq: Two times a day (BID) | ORAL | Status: DC
  Administered 2019-06-20 – 2019-06-21 (×2): 1000 mg via ORAL
  Filled 2019-06-20 (×2): qty 2

## 2019-06-20 MED ORDER — PROMETHAZINE HCL 25 MG/ML IJ SOLN: 12.5000 mg | INTRAMUSCULAR | Status: DC | PRN

## 2019-06-20 MED ORDER — CYCLOBENZAPRINE HCL 10 MG PO TABS
5.0000 mg | ORAL_TABLET | Freq: Every day | ORAL | Status: DC
  Administered 2019-06-20 – 2019-06-21 (×2): 5 mg via ORAL
  Filled 2019-06-20 (×2): qty 1

## 2019-06-20 MED ORDER — MELATONIN 5 MG PO TABS
10.0000 mg | ORAL_TABLET | Freq: Every day | ORAL | Status: DC
  Administered 2019-06-20 – 2019-06-21 (×2): 10 mg via ORAL
  Filled 2019-06-20 (×3): qty 2

## 2019-06-20 MED ORDER — SODIUM CHLORIDE 0.9 % IV SOLN
1000.0000 mL | INTRAVENOUS | Status: DC
  Administered 2019-06-20: 1000 mL via INTRAVENOUS

## 2019-06-20 MED ORDER — ENOXAPARIN SODIUM 40 MG/0.4ML ~~LOC~~ SOLN
40.0000 mg | SUBCUTANEOUS | Status: DC
  Administered 2019-06-20 – 2019-06-21 (×2): 40 mg via SUBCUTANEOUS
  Filled 2019-06-20 (×2): qty 0.4

## 2019-06-20 MED ORDER — ENOXAPARIN SODIUM 40 MG/0.4ML ~~LOC~~ SOLN: 40.0000 mg | SUBCUTANEOUS | Status: DC

## 2019-06-20 MED ORDER — DOCUSATE SODIUM 100 MG PO CAPS
100.0000 mg | ORAL_CAPSULE | Freq: Two times a day (BID) | ORAL | Status: DC
  Administered 2019-06-20: 100 mg via ORAL
  Filled 2019-06-20 (×3): qty 1

## 2019-06-20 MED ORDER — BUDESONIDE 0.5 MG/2ML IN SUSP: 0.5000 mg | Freq: Two times a day (BID) | RESPIRATORY_TRACT | Status: DC

## 2019-06-20 MED ORDER — GUAIFENESIN 100 MG/5ML PO SOLN
5.0000 mL | ORAL | Status: DC | PRN
  Administered 2019-06-20 – 2019-06-22 (×3): 100 mg via ORAL
  Filled 2019-06-20: qty 10
  Filled 2019-06-20 (×2): qty 15

## 2019-06-20 MED ORDER — SODIUM CHLORIDE 0.9 % IV SOLN
200.0000 mg | Freq: Once | INTRAVENOUS | Status: AC
  Administered 2019-06-20: 200 mg via INTRAVENOUS
  Filled 2019-06-20: qty 200

## 2019-06-20 MED ORDER — LEVALBUTEROL HCL 1.25 MG/3ML IN NEBU: 0.6300 mg | INHALATION_SOLUTION | Freq: Four times a day (QID) | RESPIRATORY_TRACT | Status: DC | PRN

## 2019-06-20 MED ORDER — REVEFENACIN 175 MCG/3ML IN SOLN: 3.0000 mL | Freq: Every day | RESPIRATORY_TRACT | Status: DC

## 2019-06-20 MED ORDER — METHYLPREDNISOLONE SODIUM SUCC 40 MG IJ SOLR
40.0000 mg | Freq: Two times a day (BID) | INTRAMUSCULAR | Status: DC
  Administered 2019-06-20 – 2019-06-21 (×3): 40 mg via INTRAVENOUS
  Filled 2019-06-20 (×3): qty 1

## 2019-06-20 NOTE — ED Triage Notes (Signed)
Arrived by [facility] from home. Patient c/o [facility]. O2 saturation 93% on 3L/[place] Tested for COVID yesterday, still awaiting results. EMS reports audible wheezing and Rhonchi. Patient received 2g mag IV, 125 solumedrol IV, and 4 g Zofran IV en route to this facility. A&O X4.

## 2019-06-20 NOTE — Progress Notes (Signed)
Pt is not obese, scr <1, Ddimer<5. We will use standard dose lovenox.  Lovenox 40mg  SQ qday Rx signs off  , PharmD, [place], AAHIVP, CPP Infectious Disease Pharmacist 06/20/2019 4:38 PM

## 2019-06-20 NOTE — Telephone Encounter (Signed)
Called by pt.  She states she had a recent Covid+ exposure and she was tested during the day herself and is awaiting those results.  She inquired about the monoclonal antibodies however I told her we do not yet know if she is Covid+ as test is pending.  She states she is "croupy" and have difficulty breathing due to the cough and is weak and "can hardly walk".   She lives alone.  She was very difficult to understand on the phone due to coughing and crying.  I advised she should call 911 for ED evaluation.  She was very adamant she wanted to wait and hear back from Dr. Dellis Anes (however he is not on call this weekend for my practice, I am) and I advised that she should not wait to hear back from Dr. Dellis Anes if she is having trouble breathing and ambulating.  She stated she understood my recommendation but she wanted to try use of her rescue nebulizer medications and to call her other specialists on-call.  I again re-iterated that I felt she should call 911 for evaluation.

## 2019-06-20 NOTE — ED Notes (Signed)
Attempted to call report to [facility]- Lyla RN stated she was unable to take report at this time. Will call back in 10 mins per her request

## 2019-06-20 NOTE — Telephone Encounter (Signed)
Called by Anne Rich who relates a recent exposure to a COVID positive patient. She is followed in pulmonary clinic for COPD on home O2 and asthma by Dr. Chestine Spore. She states that she has a cough and  feels extremely weak. She denies fever or myalgias. I recommended that she go to the emergency department at [facility] or [facility] for further evaluation and possible hospital admission.

## 2019-06-20 NOTE — ED Notes (Signed)
Pt requesting to sit in chair. Pt in chair.

## 2019-06-20 NOTE — Progress Notes (Signed)
Patient continues to suffer from N/V after given Zofran. Nurse has requested something different from on call attending.

## 2019-06-20 NOTE — ED Provider Notes (Signed)
[name]-EMERGENCY DEPT Provider Note   CSN: [id number] Arrival date & time: 06/20/19  0645     History Chief Complaint  Patient presents with  . Shortness of Breath    Anne Rich is a 69 y.o. female.  69 year old female with history of COPD on chronic oxygen who presents with increased shortness of breath times several days.  Had a Covid test a few days ago without result.  Today awoke with wheezing and dyspnea.  Called EMS and was given magnesium, Solu-Medrol, Zofran.  Feels much better at this time. Subjective fever and malaise.  No loss of taste.  No vomiting but some nausea.  Some watery diarrhea but no abdominal pain.        Past Medical History:  Diagnosis Date  . Allergic rhinitis   . Asthma   . COPD (chronic obstructive pulmonary disease) [facility])     Patient Active Problem List   Diagnosis Date Noted  . Medication management 05/29/2019  . Severe persistent asthma without complication 03/16/2019  . Physical deconditioning 01/06/2019  . Counseling regarding end of life decision making 01/06/2019  . Moderate persistent asthma with acute exacerbation 09/09/2018  . Lumbosacral dysfunction 09/04/2018  . Chronic respiratory failure with hypoxia (HCC) 05/01/2018  . Statin intolerance 02/13/2018  . Moderate persistent asthma, uncomplicated 01/22/2018  . Adverse effect of other drugs, medicaments and biological substances, initial encounter 01/01/2018  . Restrictive airway disease 12/09/2017  . Bilateral carotid artery stenosis 10/14/2017  . Spells of decreased attentiveness 09/10/2017  . Cortical age-related cataract of left eye 10/24/2016  . Nuclear sclerotic cataract of left eye 10/24/2016  . History of epistaxis 07/19/2016  . Advance directive in chart 04/04/2016  . Idiopathic peripheral neuropathy 03/12/2016  . Loss of appetite 03/12/2016  . Psychophysiological insomnia 03/12/2016  . Oxygen dependent 02/28/2016  . Easy bruising 12/26/2015   . Gastroesophageal reflux disease 12/02/2015  . Coronary artery calcification seen on CT scan 09/20/2015  . 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HCC) 09/20/2015  . Abnormal weight loss 09/06/2015  . Herniated lumbar intervertebral disc 07/21/2015  . Migraine without aura and without status migrainosus, not intractable 07/21/2015  . Anxiety 06/28/2015  . Chronic constipation 06/28/2015  . Chronic use of benzodiazepine for therapeutic purpose 06/28/2015  . Former smoker 06/28/2015  . Hepatic steatosis 06/28/2015  . History of cerebrovascular accident (CVA) with residual deficit 06/28/2015  . 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 Hypoxemia 06/28/2015  . 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 Osteopenia 06/28/2015  . Unspecified convulsions (HCC) 06/28/2015  . Acute poliomyelitis, unspecified 04/18/2015  . CVA (cerebral vascular accident) (HCC) 04/18/2015  . Essential hypertension 04/18/2015  . Other allergic rhinitis 02/10/2015  . 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 Hip pain 07/08/2013  . Melena 04/22/2013  . Hypercholesterolemia 02/12/2013  . Depressive disorder 11/15/2012  . 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 Low back pain 06/18/2012  . 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 Right foot pain 06/18/2012  . Onychomycosis 03/21/2012  . Pain, chronic 07/13/2011  . Shortness of breath 02/13/2011    Past Surgical History:  Procedure Laterality Date  . ABDOMINAL HYSTERECTOMY    . APPENDECTOMY    . CESAREAN SECTION       OB History   No obstetric history on file.     Family History  Problem Relation Age of Onset  . Allergic rhinitis Neg Hx   . Angioedema Neg Hx   . Asthma Neg Hx   . Eczema Neg  Hx   . Immunodeficiency Neg Hx   . Urticaria Neg Hx     Social History   Tobacco Use  . Smoking status: Former Smoker    Packs/day: 0.50    Years: 15.00    Pack years: 7.50    Types: Cigarettes    Quit date: 06/12/1995    Years since quitting: 24.0   . Smokeless tobacco: Never Used  Substance Use Topics  . Alcohol use: No  . Drug use: No    Home Medications Prior to Admission medications   Medication Sig Start Date End Date Taking? Authorizing Provider  albuterol (VENTOLIN HFA) 108 (90 Base) MCG/ACT inhaler Inhale 2 puffs into the lungs every 4 (four) hours as needed for wheezing or shortness of breath. 03/16/19   Hetty Blend, FNP  arformoterol (BROVANA) 15 MCG/2ML NEBU Take 2 mLs (15 mcg total) by nebulization 2 (two) times daily. 06/08/19   Coral Ceo, NP  aspirin 81 MG tablet Take 81 mg by mouth daily.    [provider]  b complex vitamins capsule Take 1 capsule by mouth daily.    [provider]  budesonide (PULMICORT) 0.5 MG/2ML nebulizer solution Take 2 mLs (0.5 mg total) by nebulization 2 (two) times daily. This replaces symbicort 05/25/19   Karie Fetch P, DO  carbamazepine (TEGRETOL) 200 MG tablet Take 200 mg by mouth 2 (two) times daily. 1 tab in the morning and 2 at night    [provider]  cetirizine (ZYRTEC) 10 MG tablet Take 10 mg by mouth daily.    [provider]  cyclobenzaprine (FLEXERIL) 5 MG tablet Take 5 mg by mouth daily.    [provider]  diclofenac sodium (VOLTAREN) 1 % GEL Apply to affected area four times daily as needed for pain. 11/10/18   [provider]  dicyclomine (BENTYL) 10 MG capsule TAKE 1 CAPSULE (10 MG TOTAL) BY MOUTH 4 TIMES DAILY BEFORE MEALS AND NIGHTLY. AS NEEDED 04/28/17   [provider]  EPINEPHrine (EPIPEN 2-PAK) 0.3 mg/0.3 mL IJ SOAJ injection Inject 0.3 mg into the muscle Once PRN.    [provider]  estradiol (ESTRACE) 1 MG tablet Take 1 mg by mouth daily. 11/19/17   [provider]  gabapentin (NEURONTIN) 600 MG tablet Take 600 mg by mouth 2 (two) times daily. 1 in the morning and 2 at bedtime 11/25/18   [provider]  hydrOXYzine (VISTARIL) 25 MG capsule Take 25 mg by mouth 2 (two) times daily.  03/30/19   [provider]  levalbuterol Pauline Aus) 1.25 MG/3ML nebulizer solution Take 0.63 mg by nebulization every 6 (six) hours as needed for wheezing.     [provider]  levETIRAcetam (KEPPRA) 1000 MG tablet Take 1,000 mg by mouth 2 (two) times daily. 1 in the morning and 2 at night    [provider]  levocetirizine (XYZAL) 5 MG tablet Take 1 tablet (5 mg total) by mouth daily as needed for allergies (for runny nose). 06/17/19   Ambs, Norvel Richards, FNP  lidocaine (LIDODERM) 5 % APPLY PATCH TO PAINFUL AREA. PATCH MAY REMAIN IN PLACE FOR UP TO 12 HOURS IN A 24 HOUR PERIOD. 12/11/18   [provider]  lisinopril (PRINIVIL,ZESTRIL) 10 MG tablet Take 10 mg by mouth daily.  12/10/17   [provider]  loperamide (IMODIUM) 2 MG capsule Take 2 mg by mouth as needed for diarrhea or loose stools.    [provider]  LORazepam (ATIVAN)  1 MG tablet Take 1 mg by mouth 3 (three) times daily. 12/01/18   [provider]  meclizine (ANTIVERT) 25 MG tablet Take 25 mg by mouth 2 (two) times daily as needed.  01/27/13   [provider]  Melatonin 10 MG TABS Take 10 mg by mouth daily.    [provider]  mirtazapine (REMERON) 30 MG tablet every evening. 12/06/18   [provider]  montelukast (SINGULAIR) 10 MG tablet TAKE 1 TABLET BY MOUTH EVERYDAY AT BEDTIME 11/17/18   Ambs, Kathrine Cords, FNP  Multiple Vitamin (MULTIVITAMIN) tablet Take by mouth.    [provider]  omeprazole (PRILOSEC) 40 MG capsule Take 40 mg by mouth 2 (two) times a day.    [provider]  ondansetron (ZOFRAN) 4 MG tablet Take 4 mg by mouth. 04/17/16   [provider]  OXYGEN Inhale 3 L into the lungs at bedtime.    [provider]  promethazine-dextromethorphan (PROMETHAZINE-DM) 6.25-15 MG/5ML syrup SMARTSIG:2.5 Milliliter(s) By Mouth 4 Times Daily PRN 04/02/19   [provider]  Revefenacin (YUPELRI) 175 MCG/3ML SOLN Inhale 3 mLs  into the lungs daily. 05/25/19   Julian Hy, DO  Roflumilast (DALIRESP) 250 MCG TABS Take 250 mcg by mouth daily for 28 days. SAMPLE PROVIDED - LOT [id number] EXP 11/08/2021 06/08/19 07/06/19  Lauraine Rinne, NP  salmeterol (SEREVENT DISKUS) 50 MCG/DOSE diskus inhaler Inhale 1 puff into the lungs 2 (two) times daily. 05/21/19   Julian Hy, DO    Allergies    Iodine, Iodine-131, Penicillins, Pineapple, Shellfish allergy, Molds & smuts, Pravastatin, Shellfish-derived products, and Iodinated diagnostic agents  Review of Systems   Review of Systems  All other systems reviewed and are negative.   Physical Exam Updated Vital Signs SpO2 94%   Physical Exam Vitals and nursing note reviewed.  Constitutional:      General: She is not in acute distress.    Appearance: Normal appearance. She is well-developed. She is not toxic-appearing.  HENT:     Head: Normocephalic and atraumatic.  Eyes:     General: Lids are normal.     Conjunctiva/sclera: Conjunctivae normal.     Pupils: Pupils are equal, round, and reactive to light.  Neck:     Thyroid: No thyroid mass.     Trachea: No tracheal deviation.  Cardiovascular:     Rate and Rhythm: Normal rate and regular rhythm.     Heart sounds: Normal heart sounds. No murmur. No gallop.   Pulmonary:     Effort: Pulmonary effort is normal. No respiratory distress.     Breath sounds: No stridor. Decreased breath sounds present. No wheezing, rhonchi or rales.  Abdominal:     General: Bowel sounds are normal. There is no distension.     Palpations: Abdomen is soft.     Tenderness: There is no abdominal tenderness. There is no rebound.  Musculoskeletal:        General: No tenderness. Normal range of motion.     Cervical back: Normal range of motion and neck supple.  Skin:    General: Skin is warm and dry.     Findings: No abrasion or rash.  Neurological:     Mental Status: She is alert and oriented to person, place, and time.     GCS: GCS eye  subscore is 4. GCS verbal subscore is 5. GCS motor subscore is 6.     Cranial Nerves: No cranial nerve deficit.     Sensory:  No sensory deficit.  Psychiatric:        Speech: Speech normal.        Behavior: Behavior normal.     ED Results / Procedures / Treatments   Labs (all labs ordered are listed, but only abnormal results are displayed) Labs Reviewed  CBC WITH DIFFERENTIAL/PLATELET  BASIC METABOLIC PANEL  POC SARS CORONAVIRUS 2 AG -  ED    EKG None  Radiology No results found.  Procedures Procedures (including critical care time)  Medications Ordered in ED Medications - No data to display  ED Course  I have reviewed the triage vital signs and the nursing notes.  Pertinent labs & imaging results that were available during my care of the patient were reviewed by me and considered in my medical decision making (see chart for details).    MDM Rules/Calculators/A&P                      Patient here with rapid Covid test that was positive.  Chest x-ray consistent with COPD.  Patient had significant exacerbation of COPD in the setting of Covid has now been using more oxygen than normal.  Will require admission for observation Final Clinical Impression(s) / ED Diagnoses Final diagnoses:  None    Rx / DC Orders ED Discharge Orders    None       Lorre Nick, MD 06/20/19 [phone]

## 2019-06-20 NOTE — ED Notes (Signed)
[facility] called for transport. 

## 2019-06-20 NOTE — ED Notes (Signed)
Pt 83% on RA. Pt placed on 4L o2 via [place]

## 2019-06-20 NOTE — H&P (Addendum)
History and Physical    Marshea Wisher KXF:[id number] DOB: [birth year] DOA: 06/20/2019  PCP: Patient, No Pcp Per  Patient coming from: home (senior apartment)  I have personally briefly reviewed patient's old medical records in [facility]  Chief Complaint: " I could not breathe"  HPI: Glenn Christo is a 69 y.o. 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 Pt has baseline SOB but usually responded to home Brovana, pulmicort neb, and PRN Xopenex neb. However, last night she's so SOB and wheezing that none of these medications helped. As the breathing situation did not improve this morning, as she felt fever and chills, having some loose stool (different from her usual IBS BM style), she decided to come to ER for further evaluation. She denies chest pain, sore throat, palpitation, loss of smell or taste, abdominal pain, dysuria, etc.   ED Course:  Tm 99.20F, HR 99-105, RR 17-27, BP 119/58. O2 sat 94% on 4L [place]. Sars-Covid test positive. Na 134, normal Cr and LFT, normal wbc, Hb 12.2. CXR showed changes of COPD stable, no edema or consolidation. After three rounds of duoneb and iv SoluMedrol, pt felt better but still SOB with exertion and a bit unsteady when standing up.    Review of Systems: As per HPI otherwise 10 point review of systems negative.     Past Medical History:  Diagnosis Date  . Allergic rhinitis   . Asthma   . COPD (chronic obstructive pulmonary disease) (HCC)     Past Surgical History:  Procedure Laterality Date  . ABDOMINAL HYSTERECTOMY    . APPENDECTOMY    . CESAREAN SECTION       reports that she quit smoking about 24 years ago. Her smoking use included cigarettes. She has a 7.50 pack-year smoking  history. She has never used smokeless tobacco. She reports that she does not drink alcohol or use drugs.  Allergies  Allergen Reactions  . Iodine Itching, Rash and Swelling    IV contrast   . Iodine-131 Itching and Swelling    Pt states causes severe itching Pt states causes severe itching   . Penicillins Hives and Swelling    Swelling of the throat  . Pineapple Other (See Comments) and Swelling    Pt states causes her throat swelling Pt states causes her throat swelling   . Shellfish Allergy Rash and Swelling    Had reaction to cardiac cath dye  Had seizure ,rash ,itch Oct. 2012 Had reaction to cardiac cath dye  Had seizure ,rash ,itch Oct. 2012  Seizure during Cardiac Cath 2012  . 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 Pravastatin Other (See Comments)    Pt states she couldn't lift her legs to walk  . Shellfish-Derived Products   . Iodinated Diagnostic Agents Rash    Family History  Problem Relation Age of Onset  . Allergic rhinitis Neg Hx   . Angioedema Neg Hx   . Asthma Neg Hx   . Eczema Neg Hx   . Immunodeficiency Neg Hx   . Urticaria Neg Hx    Family history reviewed and not pertinent Prior to Admission medications   Medication Sig Start Date End Date Taking? Authorizing Provider  albuterol (  VENTOLIN HFA) 108 (90 Base) MCG/ACT inhaler Inhale 2 puffs into the lungs every 4 (four) hours as needed for wheezing or shortness of breath. 03/16/19   Hetty Blend, FNP  arformoterol (BROVANA) 15 MCG/2ML NEBU Take 2 mLs (15 mcg total) by nebulization 2 (two) times daily. 06/08/19   Coral Ceo, NP  aspirin 81 MG tablet Take 81 mg by mouth daily.    [provider]  b complex vitamins capsule Take 1 capsule by mouth daily.    [provider]  budesonide (PULMICORT) 0.5 MG/2ML nebulizer solution Take 2 mLs (0.5 mg total) by nebulization 2 (two) times daily. This replaces symbicort 05/25/19   Karie Fetch P, DO  carbamazepine  (TEGRETOL) 200 MG tablet Take 200 mg by mouth 2 (two) times daily. 1 tab in the morning and 2 at night    [provider]  cetirizine (ZYRTEC) 10 MG tablet Take 10 mg by mouth daily.    [provider]  cyclobenzaprine (FLEXERIL) 5 MG tablet Take 5 mg by mouth daily.    [provider]  diclofenac sodium (VOLTAREN) 1 % GEL Apply to affected area four times daily as needed for pain. 11/10/18   [provider]  dicyclomine (BENTYL) 10 MG capsule TAKE 1 CAPSULE (10 MG TOTAL) BY MOUTH 4 TIMES DAILY BEFORE MEALS AND NIGHTLY. AS NEEDED 04/28/17   [provider]  diphenoxylate-atropine (LOMOTIL) 2.5-0.025 MG tablet Take 1 tablet by mouth 4 (four) times daily as needed for diarrhea or loose stools. 06/18/19   [provider]  EPINEPHrine (EPIPEN 2-PAK) 0.3 mg/0.3 mL IJ SOAJ injection Inject 0.3 mg into the muscle Once PRN.    [provider]  estradiol (ESTRACE) 1 MG tablet Take 1 mg by mouth daily. 11/19/17   [provider]  gabapentin (NEURONTIN) 600 MG tablet Take 600 mg by mouth 2 (two) times daily. 1 in the morning and 2 at bedtime 11/25/18   [provider]  hydrOXYzine (VISTARIL) 25 MG capsule Take 25 mg by mouth 2 (two) times daily. 03/30/19   [provider]  levalbuterol Pauline Aus) 1.25 MG/3ML nebulizer solution Take 0.63 mg by nebulization every 6 (six) hours as needed for wheezing.     [provider]  levETIRAcetam (KEPPRA) 1000 MG tablet Take 1,000 mg by mouth 2 (two) times daily. 1 in the morning and 2 at night    [provider]  levocetirizine (XYZAL) 5 MG tablet Take 1 tablet (5 mg total) by mouth daily as needed for allergies (for runny nose). 06/17/19   Ambs, Norvel Richards, FNP  lidocaine (LIDODERM) 5 % APPLY PATCH TO PAINFUL AREA. PATCH MAY REMAIN IN PLACE FOR UP TO 12 HOURS IN A 24 HOUR PERIOD. 12/11/18   [provider]  lisinopril (PRINIVIL,ZESTRIL) 10 MG tablet Take 10 mg by mouth  daily.  12/10/17   [provider]  loperamide (IMODIUM) 2 MG capsule Take 2 mg by mouth as needed for diarrhea or loose stools.    [provider]  LORazepam (ATIVAN) 1 MG tablet Take 1 mg by mouth 3 (three) times daily. 12/01/18   [provider]  meclizine (ANTIVERT) 25 MG tablet Take 25 mg by mouth 2 (two) times daily as needed.  01/27/13   [provider]  Melatonin 10 MG TABS Take 10 mg by mouth daily.    [provider]  mirtazapine (REMERON) 30 MG tablet every evening. 12/06/18   [provider]  montelukast (SINGULAIR) 10  MG tablet TAKE 1 TABLET BY MOUTH EVERYDAY AT BEDTIME 11/17/18   Ambs, Norvel Richards, FNP  Multiple Vitamin (MULTIVITAMIN) tablet Take by mouth.    [provider]  omeprazole (PRILOSEC) 40 MG capsule Take 40 mg by mouth 2 (two) times a day.    [provider]  ondansetron (ZOFRAN) 4 MG tablet Take 4 mg by mouth. 04/17/16   [provider]  OXYGEN Inhale 3 L into the lungs at bedtime.    [provider]  promethazine-dextromethorphan (PROMETHAZINE-DM) 6.25-15 MG/5ML syrup SMARTSIG:2.5 Milliliter(s) By Mouth 4 Times Daily PRN 04/02/19   [provider]  Revefenacin (YUPELRI) 175 MCG/3ML SOLN Inhale 3 mLs into the lungs daily. 05/25/19   Steffanie Dunn, DO  Roflumilast (DALIRESP) 250 MCG TABS Take 250 mcg by mouth daily for 28 days. SAMPLE PROVIDED - LOT [id number] EXP 11/08/2021 06/08/19 07/06/19  Coral Ceo, NP  salmeterol (SEREVENT DISKUS) 50 MCG/DOSE diskus inhaler Inhale 1 puff into the lungs 2 (two) times daily. 05/21/19   Steffanie Dunn, DO    Physical Exam: Vitals:   06/20/19 0729 06/20/19 0730 06/20/19 0800 06/20/19 0842  BP: 134/60 98/67 (!) 119/58   Pulse: (!) 101 99 (!) 105 (!) 102  Resp: 18 17 (!) 27 (!) 21  Temp: 99.3 F (37.4 C)     TempSrc: Oral     SpO2: 93% 91% 96% 94%    Constitutional: NAD, calm, comfortable Vitals:   06/20/19 0729 06/20/19 0730 06/20/19 0800  06/20/19 0842  BP: 134/60 98/67 (!) 119/58   Pulse: (!) 101 99 (!) 105 (!) 102  Resp: 18 17 (!) 27 (!) 21  Temp: 99.3 F (37.4 C)     TempSrc: Oral     SpO2: 93% 91% 96% 94%   Eyes: PERRL, lids and conjunctivae normal ENMT: Mucous membranes are moist. Posterior pharynx clear of any exudate or lesions.Normal dentition.  Neck: normal, supple, no masses, no thyromegaly Respiratory: diminished breath sound bilaterally, with expiratory wheezing and rhonchi. In mild respiratory distress and she is using accessory muscle use.  Cardiovascular: Regular rhythm but tachycardia, no murmurs / rubs / gallops. No extremity edema. 2+ pedal pulses. No carotid bruits.  Abdomen: no tenderness, no masses palpated. No hepatosplenomegaly. Bowel sounds positive.  Musculoskeletal: no clubbing / cyanosis. No joint deformity upper and lower extremities. Good ROM, no contractures. Normal muscle tone.  Skin: no rashes, lesions, ulcers. No induration Neurologic: CN 2-12 grossly intact. Sensation intact, DTR normal. Strength 5/5 in all 4.  Psychiatric: Normal judgment and insight. Alert and oriented x 3. Normal mood.     Labs on Admission: I have personally reviewed following labs and imaging studies  CBC: Recent Labs  Lab 06/20/19 0722  WBC 10.1  NEUTROABS 9.3*  HGB 12.2  HCT 37.9  MCV 94.5  PLT 266   Basic Metabolic Panel: Recent Labs  Lab 06/20/19 0722  NA 134*  K 4.0  CL 97*  CO2 27  GLUCOSE 101*  BUN 8  CREATININE 0.58  CALCIUM 8.2*   GFR: CrCl cannot be calculated (Unknown ideal weight.). Liver Function Tests: No results for input(s): AST, ALT, ALKPHOS, BILITOT, PROT, ALBUMIN in the last 168 hours. No results for input(s): LIPASE, AMYLASE in the last 168 hours. No results for input(s): AMMONIA in the last 168 hours. Coagulation Profile: No results for input(s): INR, PROTIME in the last 168 hours. Cardiac Enzymes: No results for input(s): CKTOTAL, CKMB, CKMBINDEX, TROPONINI in the  last 168 hours. BNP (  last 3 results) No results for input(s): PROBNP in the last 8760 hours. HbA1C: No results for input(s): HGBA1C in the last 72 hours. CBG: No results for input(s): GLUCAP in the last 168 hours. Lipid Profile: No results for input(s): CHOL, HDL, LDLCALC, TRIG, CHOLHDL, LDLDIRECT in the last 72 hours. Thyroid Function Tests: No results for input(s): TSH, T4TOTAL, FREET4, T3FREE, THYROIDAB in the last 72 hours. Anemia Panel: No results for input(s): VITAMINB12, FOLATE, FERRITIN, TIBC, IRON, RETICCTPCT in the last 72 hours. Urine analysis: No results found for: COLORURINE, APPEARANCEUR, [place], [place], GLUCOSEU, HGBUR, BILIRUBINUR, KETONESUR, PROTEINUR, UROBILINOGEN, NITRITE, LEUKOCYTESUR  Radiological Exams on Admission: DG Chest Port 1 View  Result Date: 06/20/2019 CLINICAL DATA:  Shortness of breath EXAM: PORTABLE CHEST 1 VIEW COMPARISON:  June 19, 2018 chest CT. Chest radiograph February 12, 2018 FINDINGS: Lungs appear hyperexpanded. Bullous disease in the upper lobes is better seen by CT. No edema or consolidation. The heart size and pulmonary vascularity are normal. No evident adenopathy. No bone lesions. IMPRESSION: Changes of COPD appear stable. No edema or consolidation. Stable cardiac silhouette. Electronically Signed   By: Lowella Grip III M.D.   On: 06/20/2019 08:40    EKG: Independently reviewed.   Assessment/Plan Principal Problem:   Asthma with COPD with exacerbation ([place]) Active Problems:   Asthma-COPD overlap syndrome (HCC)   Chronic respiratory failure with hypoxia ([place]), 3L home O2   CVA (cerebral vascular accident) ([place])   Pain, chronic   COVID-19 virus infection     Plan: - admit to med-tele unit with continuous pulse ox monitoring as well - for asthma/copd exacerbation, start with iv SoluMEdrol 40 mg bid, Xopenex neb q6h PRN, continue Brovana, pulmicort neb and Singulair. Start with guaifenesin PRN for cough. Monitor peak flow.   Consider pulmonology consult if no improvement - for Covid infection, will initiate iv Remdesivir given pt requires O2 and symptoms onset is <10 days. Will check CT chest wo contrast given CXR may not catch early glass ground changes. Pt is already on Solumedrol for asthma/COPD exacerbation, therefore no need to order decadron. Monitor daily inflammatory markers. Check D-dimer. As pt had severe allergic history to contrast in the past, will not check CTPA.  - as pt is afebrile and normal wbc, no clear evidence of bacterial infection other than Covid infection, will not initiate other antibiotics for now.  - PRN pain control - given her baseline severe asthma/COPD and low functional status, will consult palliative care for goal of care discussion  - PT/OT  DVT prophylaxis: lovenox  Code Status: pt clearly stated she wants CPR and everything else for resuscitation except no ventilation Family Communication: no family available to discuss Disposition Plan: to be determined Consults called: none Admission status: tele  Coding: total 70 min on this encounter with >50% time on direct patient care and plan formulation  Paticia Stack MD [facility] Pager [phone]  If 7PM-7AM, please contact night-coverage [email or web address] Password [facility]  06/20/2019, 10:52 AM

## 2019-06-21 DIAGNOSIS — J441 Chronic obstructive pulmonary disease with (acute) exacerbation: Secondary | ICD-10-CM

## 2019-06-21 DIAGNOSIS — J45901 Unspecified asthma with (acute) exacerbation: Secondary | ICD-10-CM

## 2019-06-21 LAB — CBC WITH DIFFERENTIAL/PLATELET
Abs Immature Granulocytes: 0.06 10*3/uL (ref 0.00–0.07)
Basophils Absolute: 0 10*3/uL (ref 0.0–0.1)
Basophils Relative: 0 %
Eosinophils Absolute: 0 10*3/uL (ref 0.0–0.5)
Eosinophils Relative: 0 %
HCT: 34.8 % — ABNORMAL LOW (ref 36.0–46.0)
Hemoglobin: 11.2 g/dL — ABNORMAL LOW (ref 12.0–15.0)
Immature Granulocytes: 1 %
Lymphocytes Relative: 15 %
Lymphs Abs: 1.9 10*3/uL (ref 0.7–4.0)
MCH: 30.3 pg (ref 26.0–34.0)
MCHC: 32.2 g/dL (ref 30.0–36.0)
MCV: 94.1 fL (ref 80.0–100.0)
Monocytes Absolute: 1 10*3/uL (ref 0.1–1.0)
Monocytes Relative: 8 %
Neutro Abs: 9.6 10*3/uL — ABNORMAL HIGH (ref 1.7–7.7)
Neutrophils Relative %: 76 %
Platelets: 269 10*3/uL (ref 150–400)
RBC: 3.7 MIL/uL — ABNORMAL LOW (ref 3.87–5.11)
RDW: 13.2 % (ref 11.5–15.5)
WBC: 12.7 10*3/uL — ABNORMAL HIGH (ref 4.0–10.5)
nRBC: 0 % (ref 0.0–0.2)

## 2019-06-21 LAB — BRAIN NATRIURETIC PEPTIDE: B Natriuretic Peptide: 74.5 pg/mL (ref 0.0–100.0)

## 2019-06-21 LAB — COMPREHENSIVE METABOLIC PANEL
ALT: 19 U/L (ref 0–44)
AST: 23 U/L (ref 15–41)
Albumin: 3.2 g/dL — ABNORMAL LOW (ref 3.5–5.0)
Alkaline Phosphatase: 58 U/L (ref 38–126)
Anion gap: 10 (ref 5–15)
BUN: 11 mg/dL (ref 8–23)
CO2: 26 mmol/L (ref 22–32)
Calcium: 8.4 mg/dL — ABNORMAL LOW (ref 8.9–10.3)
Chloride: 102 mmol/L (ref 98–111)
Creatinine, Ser: 0.53 mg/dL (ref 0.44–1.00)
GFR calc Af Amer: >60 mL/min (ref >60)
GFR calc non Af Amer: >60 mL/min (ref >60)
Glucose, Bld: 91 mg/dL (ref 70–99)
Potassium: 4 mmol/L (ref 3.5–5.1)
Sodium: 138 mmol/L (ref 135–145)
Total Bilirubin: 0.5 mg/dL (ref 0.3–1.2)
Total Protein: 6.1 g/dL — ABNORMAL LOW (ref 6.5–8.1)

## 2019-06-21 LAB — MAGNESIUM: Magnesium: 2.3 mg/dL (ref 1.7–2.4)

## 2019-06-21 LAB — EXPECTORATED SPUTUM ASSESSMENT W GRAM STAIN, RFLX TO RESP C

## 2019-06-21 LAB — C-REACTIVE PROTEIN: CRP: 15.9 mg/dL — ABNORMAL HIGH (ref <1.0)

## 2019-06-21 LAB — HIV ANTIBODY (ROUTINE TESTING W REFLEX): HIV Screen 4th Generation wRfx: NONREACTIVE

## 2019-06-21 LAB — PROCALCITONIN: Procalcitonin: <0.1 ng/mL

## 2019-06-21 LAB — D-DIMER, QUANTITATIVE: D-Dimer, Quant: 0.37 ug/mL-FEU (ref 0.00–0.50)

## 2019-06-21 MED ORDER — METHYLPREDNISOLONE SODIUM SUCC 40 MG IJ SOLR
40.0000 mg | Freq: Every day | INTRAMUSCULAR | Status: DC
  Filled 2019-06-21: qty 1

## 2019-06-21 MED ORDER — HYDROCOD POLST-CPM POLST ER 10-8 MG/5ML PO SUER
5.0000 mL | Freq: Two times a day (BID) | ORAL | Status: DC
  Administered 2019-06-21 – 2019-06-22 (×3): 5 mL via ORAL
  Filled 2019-06-21 (×3): qty 5

## 2019-06-21 MED ORDER — ENSURE ENLIVE PO LIQD
237.0000 mL | Freq: Two times a day (BID) | ORAL | Status: DC
  Administered 2019-06-21 – 2019-06-22 (×2): 237 mL via ORAL

## 2019-06-21 MED ORDER — CARBAMAZEPINE 200 MG PO TABS
200.0000 mg | ORAL_TABLET | Freq: Every morning | ORAL | Status: DC
  Administered 2019-06-22: 200 mg via ORAL
  Filled 2019-06-21: qty 1

## 2019-06-21 MED ORDER — POLYVINYL ALCOHOL 1.4 % OP SOLN
1.0000 [drp] | OPHTHALMIC | Status: DC | PRN
  Administered 2019-06-21: 1 [drp] via OPHTHALMIC
  Filled 2019-06-21: qty 15

## 2019-06-21 MED ORDER — LEVETIRACETAM 500 MG PO TABS
2000.0000 mg | ORAL_TABLET | Freq: Every day | ORAL | Status: DC
  Administered 2019-06-21: 2000 mg via ORAL
  Filled 2019-06-21: qty 4

## 2019-06-21 MED ORDER — CARBAMAZEPINE 200 MG PO TABS
400.0000 mg | ORAL_TABLET | Freq: Every day | ORAL | Status: DC
  Administered 2019-06-21: 400 mg via ORAL
  Filled 2019-06-21 (×2): qty 2

## 2019-06-21 MED ORDER — LEVETIRACETAM 500 MG PO TABS
1000.0000 mg | ORAL_TABLET | Freq: Every day | ORAL | Status: DC
  Administered 2019-06-22: 1000 mg via ORAL
  Filled 2019-06-21: qty 2

## 2019-06-21 MED ORDER — SALINE SPRAY 0.65 % NA SOLN
1.0000 | NASAL | Status: DC | PRN
  Administered 2019-06-21: 1 via NASAL
  Filled 2019-06-21: qty 44

## 2019-06-21 NOTE — Evaluation (Signed)
Physical Therapy Evaluation Patient Details Name: Anne Rich MRN: [id number] DOB: [birth year] Today's Date: 06/21/2019   History of Present Illness  69 year old female admitted with Stage IV COPD-- COVID/Pneumonia; PMH also includes asthma. She does use oxygen at home, but usually only at night- 2-3 LPM, [place].  Clinical Impression  Very pleasant female patient. She indicates that she also has history of epilepsy- and takes medication to prevent seizures. She lives in a Senior living apt setting with an Media planner. Has walk in shower, grab bars and elevated commode. Does not use any AD for ambulation. Does desat in the the 80's with min/mod exertion. She lives alone and was independent in all ADLs, and IADLs. Should benefit from PT to further address energy conservation, and general strengthening. She is receptive to PT.    Follow Up Recommendations No PT follow up    Equipment Recommendations       Recommendations for Other Services       Precautions / Restrictions Precautions Precautions: None Restrictions Weight Bearing Restrictions: No      Mobility  Bed Mobility Overal bed mobility: Independent                Transfers Overall transfer level: Independent Equipment used: None                Ambulation/Gait Ambulation/Gait assistance: Independent Gait Distance (Feet): 42 Feet Assistive device: None Gait Pattern/deviations: Narrow base of support;Shuffle Gait velocity: <1.34 ft/sec Gait velocity interpretation: <1.31 ft/sec, indicative of household Conservation officer, historic buildings [place] (Stroke Patients Only)       Balance Overall balance assessment: Independent                                           Pertinent Vitals/Pain      Home Living Family/patient expects to be discharged to:: Private residence Living Arrangements: Alone   Type of Home: Apartment(Senior Living Apts.) Home Access:  Elevator       Home Equipment: Grab bars - tub/shower;Hand held shower head      Prior Function Level of Independence: Independent               Hand Dominance   Dominant Hand: Right    Extremity/Trunk Assessment        Lower Extremity Assessment Lower Extremity Assessment: Defer to PT evaluation;Overall [facility] for tasks assessed    Cervical / Trunk Assessment Cervical / Trunk Assessment: Normal  Communication   Communication: No difficulties  Cognition Arousal/Alertness: Awake/alert   Overall Cognitive Status: Within Functional Limits for tasks assessed                                        General Comments General comments (skin integrity, edema, etc.): Good tone and turgor- no edema noted    Exercises Total Joint Exercises Ankle Circles/Pumps: AROM;Supine Heel Slides: AROM;Supine Hip ABduction/ADduction: AROM;Supine Other Exercises Other Exercises: Reviewed IS and able to achieve 100, 5 out of 7 attempts; Good return demo both with IS and Flutter- Productive coughing followed   Assessment/Plan    PT Assessment Patient needs continued PT services  PT Problem List Cardiopulmonary status limiting activity;Decreased activity tolerance;Decreased mobility  PT Treatment Interventions Gait training;Patient/family education;Therapeutic activities;Therapeutic exercise(Energy conservation)    PT Goals (Current goals can be found in the Care Plan section)  Acute Rehab PT Goals Patient Stated Goal: Just want to go home and be able to continue to be independent PT Goal Formulation: With patient Time For Goal Achievement: 07/05/19 Potential to Achieve Goals: Good    Frequency Min 3X/week   Barriers to discharge        Co-evaluation               AM-PAC PT "6 Clicks" Mobility  Outcome Measure Help needed turning from your back to your side while in a flat bed without using bedrails?: None Help needed moving from lying on your  back to sitting on the side of a flat bed without using bedrails?: None Help needed moving to and from a bed to a chair (including a wheelchair)?: None Help needed standing up from a chair using your arms (e.g., wheelchair or bedside chair)?: A Little Help needed to walk in hospital room?: None Help needed climbing 3-5 steps with a railing? : A Little 6 Click Score: 22    End of Session   Activity Tolerance: Other (comment)(stated she felt very short of breath, and spO2 was 86 at end of ambulation- recovery in 5 min to 92%)     PT Visit Diagnosis: (Reduced energy conservation/endurance)    Time: 6599-3570 PT Time Calculation (min) (ACUTE ONLY): 55 min   Charges:   PT Evaluation $PT Eval Moderate Complexity: 1 Mod PT Treatments $Gait Training: 8-22 mins $Therapeutic Exercise: 8-22 mins $Therapeutic Activity: 8-22 mins        06/21/2019  Youcef Klas P, PT # [phone] CGV cell :    Ephriam Jenkins 06/21/2019, 4:14 PM

## 2019-06-21 NOTE — Progress Notes (Signed)
Chart reviewed.   Discussed with Dr. Thedore Mins.  Consult discontinued.   Please feel free to re-consult PMT if we can be of assistance.  Norvel Richards, PA-C Palliative Medicine Office:  [phone]

## 2019-06-21 NOTE — Progress Notes (Signed)
PROGRESS NOTE                                                                                                                                                                                                             Patient Demographics:    Anne Rich, is a 69 y.o. female, DOB - 02-16-1951, BJS:[id number]  Outpatient Primary MD for the patient is Patient, No Pcp Per    LOS - 1  Admit date - 06/20/2019    Chief Complaint  Patient presents with   Shortness of Breath       Brief Narrative     Subjective:    Anne Rich today has, No headache, No chest pain, No abdominal pain - No Nausea, No new weakness tingling or numbness, mild cough - SOB.   Assessment  & Plan :     1.  Acute Covid 19 Viral Pneumonitis during the ongoing 2020 Covid 19 Pandemic - she mild COVID-19 pneumonia at best, currently on room air at daytime, she has advanced underlying COPD/asthma overlap with some hint of chronic bronchiectasis at baseline.  She is on steroids and remdesivir which will be continued.  Will check ambulatory pulse ox.  She also complains of some productive cough and is will check sputum Gram stain culture, encouraged the patient to sit up in chair in the daytime use I-S and flutter valve for pulmonary toiletry and then prone in bed when at night.   SpO2: 92 % O2 Flow Rate (L/min): 1 L/min  Recent Labs  Lab 06/19/19 1348 06/20/19 0910 06/21/19 0446  CRP  --  6.6* 15.9*  DDIMER  --  0.84* 0.37  FERRITIN  --  114  --   BNP  --   --  74.5  PROCALCITON  --  <0.10 <0.10  SARSCOV2NAA Detected*  --   --     Hepatic Function Latest Ref Rng & Units 06/21/2019  Total Protein 6.5 - 8.1 g/dL 6.1(L)  Albumin 3.5 - 5.0 g/dL 3.2(L)  AST 15 - 41 U/L 23  ALT 0 - 44 U/L 19  Alk Phosphatase 38 - 126 U/L 58  Total Bilirubin 0.3 - 1.2 mg/dL 0.5    2.  History of COPD/asthma.  Possible some underlying bronchiectasis.  Supportive  care.  Uses nighttime oxygen.  Continue.  3.  Underlying history  of anxiety.  Home dose lorazepam.  4.  Hypertension.  On lisinopril.  5.  Patient on Keppra and carbamazepine.  Unclear reason for now continue.     Condition - Fair  Family Communication  :    Code Status :  Full  Diet :   Diet Order            Diet regular Room service appropriate? Yes; Fluid consistency: Thin  Diet effective now               Disposition Plan  :  Home  Consults  :  None  Procedures  :    CT - . New mild patchy ground-glass opacities in both lungs. New small focus of patchy consolidation at the left lung base. Findings are compatible with COVID-19 pneumonia. 2. Severe centrilobular emphysema with bullous emphysema in the lingula and mild diffuse bronchial wall thickening, compatible with the reported history of COPD. 3. Two vessel coronary atherosclerosis. Aortic Atherosclerosis   PUD Prophylaxis : PPI  DVT Prophylaxis  :  Lovenox   Lab Results  Component Value Date   PLT 269 06/21/2019    Inpatient Medications  Scheduled Meds:  aspirin  81 mg Oral Daily   carbamazepine  200 mg Oral BID   chlorpheniramine-HYDROcodone  5 mL Oral Q12H   cyclobenzaprine  5 mg Oral QHS   dicyclomine  10 mg Oral TID AC   docusate sodium  100 mg Oral BID   enoxaparin (LOVENOX) injection  40 mg Subcutaneous Q24H   feeding supplement (ENSURE ENLIVE)  237 mL Oral BID BM   gabapentin  1,200 mg Oral QHS   gabapentin  600 mg Oral Daily   [START ON 06/22/2019] levETIRAcetam  1,000 mg Oral Daily   levETIRAcetam  2,000 mg Oral Q2200   lisinopril  10 mg Oral QHS   loratadine  10 mg Oral Daily   LORazepam  1 mg Oral TID   Melatonin  10 mg Oral QHS   [START ON 06/22/2019] methylPREDNISolone (SOLU-MEDROL) injection  40 mg Intravenous Daily   mirtazapine  30 mg Oral QHS   montelukast  10 mg Oral QHS   pantoprazole  40 mg Oral Daily   senna  1 tablet Oral BID   Continuous  Infusions:  remdesivir 100 mg in NS 100 mL 100 mg (06/21/19 0829)   PRN Meds:.acetaminophen **OR** acetaminophen, guaiFENesin, levalbuterol, meclizine, ondansetron **OR** ondansetron (ZOFRAN) IV, polyvinyl alcohol, promethazine, sodium chloride  Antibiotics  :    Anti-infectives (From admission, onward)   Start     Dose/Rate Route Frequency Ordered Stop   06/21/19 1000  remdesivir 100 mg in sodium chloride 0.9 % 100 mL IVPB     100 mg 200 mL/hr over 30 Minutes Intravenous Daily 06/20/19 1059 06/25/19 0959   06/20/19 1100  remdesivir 200 mg in sodium chloride 0.9% 250 mL IVPB     200 mg 580 mL/hr over 30 Minutes Intravenous Once 06/20/19 1059 06/20/19 1140       Time Spent in minutes  30   Lala Lund M.D on 06/21/2019 at 12:06 PM  To page go to [email or web address] - password [facility]  [facility] -  Office  [phone]    See all Orders from today for further details    Objective:   Vitals:   06/20/19 2000 06/21/19 0400 06/21/19 0717 06/21/19 1153  BP: (!) 145/81 (!) 103/46 (!) 113/51 117/80  Pulse: 96 82  88  Resp:  20  20  Temp:  98.1 F (36.7 C) 98.3 F (36.8 C) 99 F (37.2 C)  TempSrc:  Oral Oral Oral  SpO2: 92% 98%  92%  Weight:      Height:        Wt Readings from Last 3 Encounters:  06/20/19 68.6 kg  05/21/19 71.5 kg  04/09/19 71.5 kg    No intake or output data in the 24 hours ending 06/21/19 1206   Physical Exam  Awake Alert,   No new F.N deficits, anxiousaffect [place].AT,PERRAL Supple Neck,No JVD, No cervical lymphadenopathy appriciated.  Symmetrical Chest wall movement, Good air movement bilaterally, CTAB RRR,No Gallops,Rubs or new Murmurs, No Parasternal Heave +ve B.Sounds, Abd Soft, No tenderness, No organomegaly appriciated, No rebound - guarding or rigidity. No Cyanosis, Clubbing or edema, No new Rash or bruise       Data Review:    CBC Recent Labs  Lab 06/20/19 0722 06/21/19 0446  WBC 10.1 12.7*  HGB 12.2 11.2*  HCT 37.9 34.8*   PLT 266 269  MCV 94.5 94.1  MCH 30.4 30.3  MCHC 32.2 32.2  RDW 12.9 13.2  LYMPHSABS 0.5* 1.9  MONOABS 0.3 1.0  EOSABS 0.0 0.0  BASOSABS 0.0 0.0    Chemistries  Recent Labs  Lab 06/20/19 0722 06/21/19 0446  NA 134* 138  K 4.0 4.0  CL 97* 102  CO2 27 26  GLUCOSE 101* 91  BUN 8 11  CREATININE 0.58 0.53  CALCIUM 8.2* 8.4*  MG  --  2.3  AST  --  23  ALT  --  19  ALKPHOS  --  58  BILITOT  --  0.5   ------------------------------------------------------------------------------------------------------------------ Recent Labs    06/20/19 0910  TRIG 83    No results found for: HGBA1C ------------------------------------------------------------------------------------------------------------------ No results for input(s): TSH, T4TOTAL, T3FREE, THYROIDAB in the last 72 hours.  Invalid input(s): FREET3  Cardiac Enzymes No results for input(s): CKMB, TROPONINI, MYOGLOBIN in the last 168 hours.  Invalid input(s): CK ------------------------------------------------------------------------------------------------------------------    Component Value Date/Time   BNP 74.5 06/21/2019 0446    Micro Results Recent Results (from the past 240 hour(s))  Novel Coronavirus, NAA ([facility])     Status: Abnormal   Collection Time: 06/19/19  1:48 PM   Specimen: Nasopharyngeal(NP) swabs in vial transport medium   NASOPHARYNGE  TESTING  Result Value Ref Range Status   SARS-CoV-2, NAA Detected (A) Not Detected Final    Comment: This nucleic acid amplification test was developed and its performance characteristics determined by [employer]. Nucleic acid amplification tests include PCR and TMA. This test has not been FDA cleared or approved. This test has been authorized by FDA under an Emergency Use Authorization (EUA). This test is only authorized for the duration of time the declaration that circumstances exist justifying the authorization of the emergency use of in  vitro diagnostic tests for detection of SARS-CoV-2 virus and/or diagnosis of COVID-19 infection under section 564(b)(1) of the Act, 21 U.S.C. 233IDH-6(Y) (1), unless the authorization is terminated or revoked sooner. When diagnostic testing is negative, the possibility of a false negative result should be considered in the context of a patient's recent exposures and the presence of clinical signs and symptoms consistent with COVID-19. An individual without symptoms of COVID-19 and who is not shedding SARS-CoV-2 virus would  expect to have a negative (not detected) result in this assay.   Blood Culture (routine x 2)     Status: None (Preliminary result)   Collection Time: 06/20/19  9:10 AM  Specimen: BLOOD LEFT HAND  Result Value Ref Range Status   Specimen Description   Final    BLOOD LEFT HAND Performed at [facility] [address], [place], [place] [address]    Special Requests   Final    BOTTLES DRAWN AEROBIC AND ANAEROBIC Blood Culture adequate volume Performed at [facility] [address], [place], [place] [address]    Culture   Final    NO GROWTH < 24 HOURS Performed at [facility] [address], [place], [place] [address]    Report Status PENDING  Incomplete  Blood Culture (routine x 2)     Status: None (Preliminary result)   Collection Time: 06/20/19  9:15 AM   Specimen: Right Antecubital; Blood  Result Value Ref Range Status   Specimen Description   Final    RIGHT ANTECUBITAL Performed at [facility] [address], [place], [place] [address]    Special Requests   Final    BOTTLES DRAWN AEROBIC ONLY Blood Culture adequate volume Performed at [facility] [address] [place], [place] [address]    Culture   Final    NO GROWTH < 24 HOURS Performed at [facility] [address], [place], [place] [address]    Report Status PENDING  Incomplete    Radiology  Reports CT CHEST WO CONTRAST  Result Date: 06/20/2019 CLINICAL DATA:  COVID-19 positive.  Dyspnea.  COPD. EXAM: CT CHEST WITHOUT CONTRAST TECHNIQUE: Multidetector CT imaging of the chest was performed following the standard protocol without IV contrast. COMPARISON:  Chest radiograph from earlier today. 06/19/2018 high-resolution chest CT. FINDINGS: Cardiovascular: Normal heart size. No significant pericardial effusion/thickening. Left anterior descending and right coronary atherosclerosis. Atherosclerotic nonaneurysmal thoracic aorta. Normal caliber pulmonary arteries. Mediastinum/Nodes: No discrete thyroid nodules. Unremarkable esophagus. No pathologically enlarged axillary, mediastinal or hilar lymph nodes, noting limited sensitivity for the detection of hilar adenopathy on this noncontrast study. Lungs/Pleura: No pneumothorax. No pleural effusion. Severe centrilobular emphysema with bullous emphysema in the lingula. Mild diffuse bronchial wall thickening. New mild patchy ground-glass opacities in both lungs, most prominent in the peripheral/posterior right upper lobe and peripheral left upper lobe. New small focus of patchy consolidation and ground-glass opacity in the posteromedial basilar left lower lobe. No lung masses or significant pulmonary nodules. Upper abdomen: No acute abnormality. Musculoskeletal:  No aggressive appearing focal osseous lesions. IMPRESSION: 1. New mild patchy ground-glass opacities in both lungs. New small focus of patchy consolidation at the left lung base. Findings are compatible with COVID-19 pneumonia. 2. Severe centrilobular emphysema with bullous emphysema in the lingula and mild diffuse bronchial wall thickening, compatible with the reported history of COPD. 3. Two vessel coronary atherosclerosis. Aortic Atherosclerosis (ICD10-I70.0) and Emphysema (ICD10-J43.9). Electronically Signed   By: Ilona Sorrel M.D.   On: 06/20/2019 12:39   DG Chest Port 1 View  Result Date:  06/20/2019 CLINICAL DATA:  Shortness of breath EXAM: PORTABLE CHEST 1 VIEW COMPARISON:  June 19, 2018 chest CT. Chest radiograph February 12, 2018 FINDINGS: Lungs appear hyperexpanded. Bullous disease in the upper lobes is better seen by CT. No edema or consolidation. The heart size and pulmonary vascularity are normal. No evident adenopathy. No bone lesions. IMPRESSION: Changes of COPD appear stable. No edema or consolidation. Stable cardiac silhouette. Electronically Signed   By: Lowella Grip III M.D.   On: 06/20/2019 08:40

## 2019-06-21 NOTE — Progress Notes (Signed)
Initial Nutrition Assessment  RD working remotely.  DOCUMENTATION CODES:   Not applicable  INTERVENTION:   - Continue Regular diet order  - Pt receiving Ensure Enlive shake daily with breakfast meal which provides 350 kcals and 20 grams of protein. Pt also receiving Magic Cup BID with lunch and dinner meals, each supplement provides 290 kcal and 9 grams of protein, automatically on meal trays to optimize nutritional intake.  - Ensure Enlive po BID between meals, each supplement provides 350 kcal and 20 grams of protein  NUTRITION DIAGNOSIS:   Increased nutrient needs related to acute illness, catabolic illness (COVID-19) as evidenced by estimated needs.  GOAL:   Patient will meet greater than or equal to 90% of their needs  MONITOR:   PO intake, Supplement acceptance, Weight trends  REASON FOR ASSESSMENT:   Consult COPD Protocol  ASSESSMENT:   69 year old female who presented to the ED on 1/09 with SOB. PMH of COPD/asthma overlap syndrome with chronic respiratory failure on 3 L home oxygen, CVA, IBS. Pt tested positive for COVID-19.   Pt on a Regular diet. No meal completions recorded at this time.  Noted pt with N/V yesterday evening that did not resolve with Zofran.  Reviewed weight history in chart. Pt with a 2.9 kg weight loss since 05/21/19. This is a 4% weight loss in 1 month which not quite significant for timeframe but is concerning.  Unable to obtain diet and weight history from pt at this time.  RD will order Ensure Enlive between meals to aid pt in meeting kcal and protein needs.  Medications reviewed and include: colace, solu-medrol, Remeron 30 mg daily, protonix, senna, remdesivir  Labs reviewed.  NUTRITION - FOCUSED PHYSICAL EXAM:  Unable to complete at this time. RD working remotely.  Diet Order:   Diet Order            Diet regular Room service appropriate? Yes; Fluid consistency: Thin  Diet effective now              EDUCATION NEEDS:    No education needs have been identified at this time  Skin:  Skin Assessment: Reviewed RN Assessment  Last BM:  no documented BM  Height:   Ht Readings from Last 1 Encounters:  06/20/19 5\' 8"  (1.727 m)    Weight:   Wt Readings from Last 1 Encounters:  06/20/19 68.6 kg    Ideal Body Weight:  63.6 kg  BMI:  Body mass index is 23 kg/m.  Estimated Nutritional Needs:   Kcal:  1700-1900  Protein:  80-95 grams  Fluid:  >/= 1.7 L    08/18/19, MS, RD, LDN Inpatient Clinical Dietitian Pager: [phone] Weekend/After Hours: [phone]

## 2019-06-22 ENCOUNTER — Ambulatory Visit: Payer: Medicare Other | Admitting: Pulmonary Disease

## 2019-06-22 LAB — COMPREHENSIVE METABOLIC PANEL
ALT: 19 U/L (ref 0–44)
AST: 25 U/L (ref 15–41)
Albumin: 3.2 g/dL — ABNORMAL LOW (ref 3.5–5.0)
Alkaline Phosphatase: 59 U/L (ref 38–126)
Anion gap: 11 (ref 5–15)
BUN: 10 mg/dL (ref 8–23)
CO2: 26 mmol/L (ref 22–32)
Calcium: 8.4 mg/dL — ABNORMAL LOW (ref 8.9–10.3)
Chloride: 101 mmol/L (ref 98–111)
Creatinine, Ser: 0.61 mg/dL (ref 0.44–1.00)
GFR calc Af Amer: >60 mL/min (ref >60)
GFR calc non Af Amer: >60 mL/min (ref >60)
Glucose, Bld: 96 mg/dL (ref 70–99)
Potassium: 3.5 mmol/L (ref 3.5–5.1)
Sodium: 138 mmol/L (ref 135–145)
Total Bilirubin: 0.5 mg/dL (ref 0.3–1.2)
Total Protein: 6.2 g/dL — ABNORMAL LOW (ref 6.5–8.1)

## 2019-06-22 LAB — CBC WITH DIFFERENTIAL/PLATELET
Abs Immature Granulocytes: 0.02 10*3/uL (ref 0.00–0.07)
Basophils Absolute: 0 10*3/uL (ref 0.0–0.1)
Basophils Relative: 0 %
Eosinophils Absolute: 0 10*3/uL (ref 0.0–0.5)
Eosinophils Relative: 0 %
HCT: 36.2 % (ref 36.0–46.0)
Hemoglobin: 11.8 g/dL — ABNORMAL LOW (ref 12.0–15.0)
Immature Granulocytes: 0 %
Lymphocytes Relative: 21 %
Lymphs Abs: 2 10*3/uL (ref 0.7–4.0)
MCH: 30.7 pg (ref 26.0–34.0)
MCHC: 32.6 g/dL (ref 30.0–36.0)
MCV: 94.3 fL (ref 80.0–100.0)
Monocytes Absolute: 1 10*3/uL (ref 0.1–1.0)
Monocytes Relative: 10 %
Neutro Abs: 6.8 10*3/uL (ref 1.7–7.7)
Neutrophils Relative %: 69 %
Platelets: 292 10*3/uL (ref 150–400)
RBC: 3.84 MIL/uL — ABNORMAL LOW (ref 3.87–5.11)
RDW: 12.9 % (ref 11.5–15.5)
WBC: 9.9 10*3/uL (ref 4.0–10.5)
nRBC: 0 % (ref 0.0–0.2)

## 2019-06-22 LAB — D-DIMER, QUANTITATIVE: D-Dimer, Quant: 0.42 ug/mL-FEU (ref 0.00–0.50)

## 2019-06-22 LAB — MAGNESIUM: Magnesium: 1.9 mg/dL (ref 1.7–2.4)

## 2019-06-22 LAB — C-REACTIVE PROTEIN: CRP: 16.1 mg/dL — ABNORMAL HIGH (ref <1.0)

## 2019-06-22 LAB — BRAIN NATRIURETIC PEPTIDE: B Natriuretic Peptide: 87.2 pg/mL (ref 0.0–100.0)

## 2019-06-22 LAB — PROCALCITONIN: Procalcitonin: <0.1 ng/mL

## 2019-06-22 MED ORDER — METHYLPREDNISOLONE 4 MG PO TBPK: ORAL_TABLET | ORAL | 0 refills | Status: DC

## 2019-06-22 MED ORDER — BUTALBITAL-APAP-CAFFEINE 50-325-40 MG PO TABS
1.0000 | ORAL_TABLET | Freq: Four times a day (QID) | ORAL | Status: DC | PRN
  Administered 2019-06-22: 1 via ORAL
  Filled 2019-06-22: qty 1

## 2019-06-22 MED ORDER — METHYLPREDNISOLONE SODIUM SUCC 40 MG IJ SOLR
20.0000 mg | Freq: Every day | INTRAMUSCULAR | Status: DC
  Administered 2019-06-22: 20 mg via INTRAVENOUS

## 2019-06-22 MED FILL — METHYLPREDNISOLONE 4 MG DOS: 4 | 6 days supply | Qty: 21 | Fill #0

## 2019-06-22 NOTE — Telephone Encounter (Signed)
PT called to report she is covid positive and is currently admitted in the covid clinic. States she will let us know when she can come in for xolair and wants to to get the covid vaccine.

## 2019-06-22 NOTE — Progress Notes (Signed)
Patient complained around 4:00 of a headache. Nurse gave her Tylenol and after 90 minutes no change. Nurse put in requested for an additional treatment for continuing pain. Vital signs taken. See flow sheets.

## 2019-06-22 NOTE — Progress Notes (Signed)
Occupational Therapy Evaluation Patient Details Name: Anne Rich MRN: [id number] DOB: April 26, 1951 Today's Date: 06/22/2019    History of Present Illness 69 year old female admitted with Stage IV COPD-- COVID/Pneumonia; PMH also includes asthma. She does use oxygen at home, but usually only at night- 2-3 LPM, [place].   Clinical Impression   PTA pt lived alone in a senior living apartment, independent in all ADL, IADL, and mobility tasks. Pt reports utilizing 4L O2 only for daily 2 mile walk and is currently on room air at the hospital. Pt currently independent to supervision for self-care and mobility tasks. Pt able to ambulate to/from bathroom, complete toileting task, and stand 5 min at the sink to complete sponge bathing task. Noted 0 instances of loss of balance however pt unsteady on feet, holding onto furniture for support. SpO2 maintained in 90s throughout on room air. 2/4 DOE. Educated and provided pt with handouts regarding energy conservation, meal delivery services, and relaxation techniques. Pt demonstrates decreased strength, endurance, balance, standing tolerance, and activity tolerance impacting ability to complete self-care and functional transfer tasks. Recommend skilled OT services to address above deficits in order to promote function and prevent further decline. Recommend HH OT for continued rehab following hospital discharge.     Follow Up Recommendations  Home health OT    Equipment Recommendations  None recommended by OT    Recommendations for Other Services       Precautions / Restrictions Precautions Precautions: None Restrictions Weight Bearing Restrictions: No      Mobility Bed Mobility               General bed mobility comments: Pt seated in bedside chair upon OT arrival.  Transfers Overall transfer level: Needs assistance Equipment used: None Transfers: Sit to/from Stand;Stand Pivot Transfers Sit to Stand: Supervision Stand pivot transfers:  Supervision       General transfer comment: To ensure balance and safety    Balance Overall balance assessment: Mild deficits observed, not formally tested                                         ADL either performed or assessed with clinical judgement   ADL Overall ADL's : Needs assistance/impaired Eating/Feeding: Independent;Sitting   Grooming: Supervision/safety;Set up;Standing   Upper Body Bathing: Supervision/ safety;Set up;Standing   Lower Body Bathing: Supervison/ safety;Set up;Sit to/from stand   Upper Body Dressing : Supervision/safety;Set up;Sitting   Lower Body Dressing: Set up;Supervision/safety;Sit to/from stand   Toilet Transfer: Supervision/safety;Set up;Regular Toilet;Ambulation   Toileting- Clothing Manipulation and Hygiene: Supervision/safety;Set up;Sit to/from stand       Functional mobility during ADLs: Supervision/safety General ADL Comments: Pt able to ambulate to/from bathroom with supervision. Pt tolerated standing 1 x 1.5 min and 1 x 5 min at the sink to get cleaned up.      Vision Baseline Vision/History: Wears glasses Wears Glasses: Reading only       Perception     Praxis      Pertinent Vitals/Pain Pain Assessment: No/denies pain     Hand Dominance Right   Extremity/Trunk Assessment Upper Extremity Assessment Upper Extremity Assessment: Generalized weakness   Lower Extremity Assessment Lower Extremity Assessment: Defer to PT evaluation       Communication Communication Communication: No difficulties   Cognition Arousal/Alertness: Awake/alert Behavior During Therapy: WFL for tasks assessed/performed Overall Cognitive Status: Within Functional Limits for tasks assessed  General Comments       Exercises Exercises: Other exercises Other Exercises Other Exercises: Incentive spirometer x 10, pulling 1558mL Other Exercises: Flutter valve x 10 with min cues  on technique.   Shoulder Instructions      Home Living Family/patient expects to be discharged to:: Private residence Living Arrangements: Alone Available Help at Discharge: Neighbor(Pt has very limited help at home. No family) Type of Home: Apartment(Senior living Apts.) Home Access: Elevator     Home Layout: One level     Bathroom Shower/Tub: Occupational psychologist: Standard     Home Equipment: Grab bars - tub/shower;Hand held shower head;Shower seat;Grab bars - toilet;Walker - 4 wheels          Prior Functioning/Environment Level of Independence: Independent        Comments: Pt independent in all ADLs, IADLs, and mobility. Pt uses oxygen and 4WW only when going for daily 2 mile walk.  Pt reports 0 falls at home.         OT Problem List: Decreased strength;Decreased activity tolerance;Impaired balance (sitting and/or standing);Cardiopulmonary status limiting activity      OT Treatment/Interventions: Self-care/ADL training;Therapeutic exercise;Neuromuscular education;Energy conservation;DME and/or AE instruction;Therapeutic activities;Patient/family education;Balance training    OT Goals(Current goals can be found in the care plan section) Acute Rehab OT Goals Patient Stated Goal: To go home Time For Goal Achievement: 07/06/19 Potential to Achieve Goals: Good ADL Goals Pt Will Perform Grooming: Independently;standing Pt Will Perform Lower Body Bathing: Independently;sit to/from stand Pt Will Perform Lower Body Dressing: Independently;sit to/from stand Pt Will Transfer to Toilet: Independently;ambulating;regular height toilet Pt Will Perform Toileting - Clothing Manipulation and hygiene: Independently;sit to/from stand Additional ADL Goal #1: Pt to recall and verbalize 3 fall prevention strategies with 0 verbal cues. Additional ADL Goal #2: Pt to recall and verbalize 3 energy conservation techniques with 0 verbal cues. Additional ADL Goal #3: Pt to  tolerate standing up to 10 min independently with oxygen maintaining above 90%, in preparation for ADLs.  OT Frequency: Min 3X/week   Barriers to D/C:            Co-evaluation              AM-PAC OT "6 Clicks" Daily Activity     Outcome Measure Help from another person eating meals?: None Help from another person taking care of personal grooming?: A Little Help from another person toileting, which includes using toliet, bedpan, or urinal?: A Little Help from another person bathing (including washing, rinsing, drying)?: A Little Help from another person to put on and taking off regular upper body clothing?: A Little Help from another person to put on and taking off regular lower body clothing?: A Little 6 Click Score: 19   End of Session Equipment Utilized During Treatment: Other (comment)(none) Nurse Communication: Mobility status  Activity Tolerance: Patient limited by fatigue(Limited by SOB) Patient left: in chair;with call bell/phone within reach  OT Visit Diagnosis: Unsteadiness on feet (R26.81);Muscle weakness (generalized) (M62.81)                Time: 3382-5053 OT Time Calculation (min): 49 min Charges:  OT General Charges $OT Visit: 1 Visit OT Evaluation $OT Eval Low Complexity: 1 Low OT Treatments $Self Care/Home Management : 8-22 mins $Therapeutic Activity: 8-22 mins  Mauri Brooklyn OTR/L [phone]   Mauri Brooklyn 06/22/2019, 9:52 AM

## 2019-06-22 NOTE — Progress Notes (Signed)
Patient scheduled for outpatient Remdesivir infusion at 230PM on Tuesday 1/12 and Wednesday 1/13.  Please advise them to report to [facility] at [address]  Drive to the security guard and tell them you are here for an infusion. They will direct you to the front entrance where we will come and get you.  For questions call [phone].  Thanks

## 2019-06-22 NOTE — Progress Notes (Signed)
Pt d/c viz PTAR. Pt given AVS. D/C instructions given to pt. Pt verbalized understanding. IV removed. VSS. D/C with meds sent with pt

## 2019-06-22 NOTE — TOC Transition Note (Signed)
Transition of Care [facility]) - CM/SW Discharge Note   Patient Details  Name: Anne Rich MRN: [id number] Date of Birth: Aug 30, 1950  Transition of Care [facility]) CM/SW Contact:  Shade Flood, LCSW Phone Number: 06/22/2019, 11:05 AM   Clinical Narrative:     Pt stable for dc today per MD. Pt will return to [facility] for outpatient Remdesivir infusions for the next two days. Transport has been arranged through contracted vendor as pt does not have family/friend support available. She will need HH and PTAR transport home. OT also asking about MOW for pt.   Spoke with pt to discuss above. She is agreeable to all. Discussed HH CMS options with pt and referral made at pt request. [facility] rep Malachy Mood states that the [facility] branch will provide services for this patient. Updated Cheryl on pt's lack of family/friend support and she stated that she will contact the [place] office because they have some church and community resources that might be able to assist pt in getting groceries or other needs met. [facility] can also arrange for MSW from [facility] if needed.   Working on [employer] referral. [facility] has 6 week waiting list. [employer] [facility] [employer] only has a Dance movement psychotherapist option. Awaiting return call. Will make referral when they return the call.   Pt needs one new Rx at dc. [facility] and [facility] only [facility] through the [place]. Pt states she does not have anyone who can pick up the medication for her at dc. [facility] Pharmacy/Tony have arranged for the medication to be courriered over and it should be to [facility] within an hour.  Pt will need PTAR for transport home. Transport forms printed to the unit. Will call for transport when all dc issues resolved.  Updated RN.    Expected Discharge Plan: [place] Barriers to Discharge: Barriers Resolved   Patient Goals and CMS Choice   CMS Medicare.gov Compare Post Acute Care list provided to:: Patient Choice offered to / list  presented to : Patient  Expected Discharge Plan and Services Expected Discharge Plan: [place] In-house Referral: Clinical Social Work   Post Acute Care Choice: [place] arrangements for the past 2 months: Apartment Expected Discharge Date: 06/22/19                         HH Arranged: PT, RN [place] Agency: [place] Date [place]: 06/22/19 Time [place]: 1059 Representative spoke with at [place]: Malachy Mood  Prior Living Arrangements/Services Living arrangements for the past 2 months: [place] with:: Self Patient language and need for interpreter reviewed:: Yes Do you feel safe going back to the place where you live?: Yes      Need for Family Participation in Patient Care: No (Comment) Care giver support system in place?: No (comment)   Criminal Activity/Legal Involvement Pertinent to Current Situation/Hospitalization: No - Comment as needed  Activities of Daily Living Home Assistive Devices/Equipment: None ADL Screening (condition at time of admission) Patient's cognitive ability adequate to safely complete daily activities?: Yes Is the patient deaf or have difficulty hearing?: No Does the patient have difficulty seeing, even when wearing glasses/contacts?: No Does the patient have difficulty concentrating, remembering, or making decisions?: No Patient able to express need for assistance with ADLs?: No Does the patient have difficulty dressing or bathing?: No Independently performs ADLs?: No Communication: Independent Dressing (OT): Independent Grooming: Independent Feeding: Independent  Bathing: Independent Toileting: Independent In/Out Bed: Independent Walks in Home: Independent Does the patient have difficulty walking or climbing stairs?: No Weakness of Legs: None Weakness of Arms/Hands: None  Permission Sought/Granted Permission sought to share information with : Research scientist (medical) granted to share information with : Yes, Verbal Permission Granted     Permission granted to share info w AGENCY: HH        Emotional Assessment   Attitude/Demeanor/Rapport: Engaged Affect (typically observed): Tearful/Crying Orientation: : Oriented to Self, Oriented to Place, Oriented to  Time, Oriented to Situation Alcohol / Substance Use: Not Applicable Psych Involvement: No (comment)  Admission diagnosis:  Asthma with COPD with exacerbation ([place]) [J44.1, Z66.063] COVID-19 [U07.1] Patient Active Problem List   Diagnosis Date Noted  . COVID-19 virus infection 06/20/2019  . Asthma with COPD with exacerbation ([place]) 06/20/2019  . Medication management 05/29/2019  . Severe persistent asthma without complication 01/60/1093  . Physical deconditioning 01/06/2019  . Counseling regarding end of life decision making 01/06/2019  . Moderate persistent asthma with acute exacerbation 09/09/2018  . Lumbosacral dysfunction 09/04/2018  . Chronic respiratory failure with hypoxia ([place]), 3L home O2 05/01/2018  . Statin intolerance 02/13/2018  . Moderate persistent asthma, uncomplicated 23/55/7322  . Adverse effect of other drugs, medicaments and biological substances, initial encounter 01/01/2018  . Restrictive airway disease 12/09/2017  . Bilateral carotid artery stenosis 10/14/2017  . Spells of decreased attentiveness 09/10/2017  . Cortical age-related cataract of left eye 10/24/2016  . Nuclear sclerotic cataract of left eye 10/24/2016  . History of epistaxis 07/19/2016  . Advance directive in chart 04/04/2016  . Idiopathic peripheral neuropathy 03/12/2016  . Loss of appetite 03/12/2016  . Psychophysiological insomnia 03/12/2016  . Oxygen dependent 02/28/2016  . Easy bruising 12/26/2015  . Gastroesophageal reflux disease 12/02/2015  . Coronary artery calcification seen on CT scan 09/20/2015  . 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 Abnormal weight loss 09/06/2015  .  Herniated lumbar intervertebral disc 07/21/2015  . Migraine without aura and without status migrainosus, not intractable 07/21/2015  . Anxiety 06/28/2015  . Chronic constipation 06/28/2015  . Chronic use of benzodiazepine for therapeutic purpose 06/28/2015  . Former smoker 06/28/2015  . Hepatic steatosis 06/28/2015  . History of cerebrovascular accident (CVA) with residual deficit 06/28/2015  . 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 Hypoxemia 06/28/2015  . 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 Osteopenia 06/28/2015  . Unspecified convulsions ([place]) 06/28/2015  . Acute poliomyelitis, unspecified 04/18/2015  . CVA (cerebral vascular accident) ([place]) 04/18/2015  . Essential hypertension 04/18/2015  . Other allergic rhinitis 02/10/2015  . Asthma-COPD overlap syndrome ([place]) 02/10/2015  . Hip pain 07/08/2013  . Melena 04/22/2013  . Hypercholesterolemia 02/12/2013  . Depressive disorder 11/15/2012  . 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 Low back pain 06/18/2012  . 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 Right foot pain 06/18/2012  . Onychomycosis 03/21/2012  . Pain, chronic 07/13/2011  . Shortness of breath 02/13/2011   PCP:  Patient, No Pcp Per Pharmacy:   [id number] [place] [place] [facility] [address] [facility] [address] [place]Phone: [phone]: [phone] [facility] [place] [address] [place] [address]Phone: [phone]: [address] W[address] BorgEThe Green[facility]rier ClinicDr[address] WMelina Modena  [place] [address] Phone: [phone] Fax: [place], [place] - [place] [facility] [address] [place] [address] Phone: [phone] Fax: [place], [place] - [address] [place] [address] Phone: [phone] Fax: [phone]     Social Determinants of Health (SDOH) Interventions    Readmission Risk Interventions Readmission Risk Prevention Plan 06/22/2019  Transportation Screening Complete  Home Care Screening Complete  Medication Review (RN CM) Complete  Some recent data might be hidden     Final next level of care: Home w Home Health Services Barriers to Discharge: Barriers Resolved   Patient Goals and CMS Choice   CMS Medicare.gov Compare Post Acute Care list provided to:: Patient Choice offered to / list presented to : Patient  Discharge Placement                       Discharge Plan and Services In-house Referral: Clinical Social Work   Post Acute Care Choice: Home Health                    HH Arranged: PT, RN [facility] Agency: [place] Date [place]: 06/22/19 Time [place]: 1059 Representative spoke with at [place]: [place] Determinants of Health ([place]) Interventions     Readmission Risk Interventions Readmission Risk Prevention Plan 06/22/2019  Transportation Screening Complete  Home Care Screening Complete  Medication Review (RN CM) Complete  Some recent data might be hidden

## 2019-06-22 NOTE — Telephone Encounter (Signed)
Pt admitteed 1/9 and is still hospitalized.  Nothing further needed at this time- will close encounter.

## 2019-06-22 NOTE — Discharge Summary (Signed)
Anne Rich JSR:[id number] DOB: [birth year] DOA: 06/20/2019  PCP: Patient, No Pcp Per  Admit date: 06/20/2019  Discharge date: 06/22/2019  Admitted From: Home  Disposition:  Home   Recommendations for Outpatient Follow-up:   Follow up with PCP in 1-2 weeks  PCP Please obtain BMP/CBC, 2 view CXR in 1week,  (see Discharge instructions)   PCP Please follow up on the following pending results:    Home Health: PT, RN Equipment/Devices: has home o2  Consultations: None  Discharge Condition: Stable    CODE STATUS: Full    Diet Recommendation: Heart Healthy   Diet Order            Diet - low sodium heart healthy        Diet regular Room service appropriate? Yes; Fluid consistency: Thin  Diet effective now               Chief Complaint  Patient presents with  . Shortness of Breath     Brief history of present illness from the day of admission and additional interim summary     Anne Rich today has, No headache, No chest pain, No abdominal pain - No Nausea, No new weakness tingling or numbness, mild cough - SOB.                                                                 Hospital Course   1.  Acute Covid 19 Viral Pneumonitis during the ongoing 2020 Covid 19 Pandemic - she mild COVID-19 pneumonia at best, currently on room air at daytime, she has advanced underlying COPD/asthma overlap with some hint of chronic bronchiectasis at baseline.    She will finish her remdesivir course in the clinic and will get a steroid taper, completely symptom-free, her main issue appears to be intense anxiety, will have her follow with PCP and her primary pulmonologist post discharge.  Also ordered home health and PT, of note patient uses 3 L nasal cannula oxygen at nighttime when she sleeps, currently she is on room air and  completely symptom-free.   Recent Labs  Lab 06/19/19 1348 06/20/19 0910 06/21/19 0446 06/22/19 0750  CRP  --  6.6* 15.9* 16.1*  DDIMER  --  0.84* 0.37 0.42  FERRITIN  --  114  --   --   BNP  --   --  74.5 87.2  PROCALCITON  --  <0.10 <0.10 <0.10  SARSCOV2NAA Detected*  --   --   --     Hepatic Function Latest Ref Rng & Units 06/22/2019 06/21/2019  Total Protein 6.5 - 8.1 g/dL 6.2(L) 6.1(L)  Albumin 3.5 - 5.0 g/dL 3.2(L) 3.2(L)  AST 15 - 41 U/L 25 23  ALT 0 - 44 U/L 19 19  Alk Phosphatase 38 - 126 U/L 59 58  Total Bilirubin 0.3 - 1.2 mg/dL 0.5 0.5    2.  History of COPD/asthma.  Possible some underlying bronchiectasis.  Supportive care.  Uses nighttime oxygen.  Continue.  3.  Underlying history of anxiety.  Home dose lorazepam.  4.  Hypertension.  On lisinopril.  5.    History of CVA and seizures.  Continue antiplatelet therapy and antiseizure medications continue outpatient follow-up with her neurologist..    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  [facility] to [facility] at [address]  Drive to the security guard and tell them you are here for an infusion. They will direct you to the front entrance where we will come and get you.  For questions call [phone].  Thanks   Follow with Primary MD in 7 days   Get CBC, CMP, 2 view Chest X ray -  checked next visit within 1 week by Primary MD   Activity: As tolerated with  Full fall precautions use walker/cane & assistance as needed  Disposition Home    Diet: Heart Healthy    Special Instructions: If you have smoked or chewed Tobacco  in the last 2 yrs please stop smoking, stop any regular Alcohol  and or any Recreational drug use.  On your next visit with your primary care physician please Get Medicines reviewed and adjusted.  Please request your Prim.MD to go over all Hospital Tests and Procedure/Radiological results at the follow up, please get all Hospital records sent to your Prim MD by signing hospital release before you go home.  If you experience worsening of your admission symptoms, develop shortness of breath, life threatening emergency, suicidal or homicidal thoughts you must seek medical attention immediately by calling 911 or calling your MD immediately  if symptoms less severe.  You Must read complete instructions/literature along with all the possible adverse reactions/side effects for all the Medicines you take and that have been prescribed to you. Take any new Medicines after you have completely understood and accpet all the possible adverse reactions/side effects.   Increase activity slowly   Complete by: As directed    MyChart COVID-19 home monitoring program   Complete by: Jun 22, 2019    Is the patient willing to use the [facility] for home monitoring?: Yes   Temperature monitoring   Complete by: Jun 22, 2019    After how many days would you like to receive a notification of this patient's flowsheet entries?: 1      Discharge Medications   Allergies as of 06/22/2019      Reactions   Iodine Itching, Rash, Swelling   IV contrast   Iodine-131 Itching, Swelling   Pt states causes severe itching Pt states causes severe itching   Penicillins Hives, Swelling   Swelling of the throat   Pineapple Other (See Comments), Swelling   Pt states causes her throat swelling Pt states  causes her throat swelling   Shellfish Allergy Rash,  Swelling   Had reaction to cardiac cath dye  Had seizure ,rash ,itch Oct. 2012 Had reaction to cardiac cath dye  Had seizure ,rash ,itch Oct. 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   arformoterol 15 MCG/2ML Nebu Commonly known as: BROVANA Take 2 mLs (15 mcg total) by nebulization 2 (two) times daily.   aspirin 81 MG tablet Take 81 mg by mouth daily.   b complex vitamins capsule Take 1 capsule by mouth daily.   budesonide 0.5 MG/2ML nebulizer solution Commonly known as: Pulmicort Take 2 mLs (0.5 mg total) by nebulization 2 (two) times daily. This replaces symbicort   carbamazepine 200 MG tablet Commonly known as: TEGRETOL Take 200-400 mg by mouth 2 (two) times daily. 263m tab in the morning and 4017mat night   cyclobenzaprine 5 MG tablet Commonly known as: FLEXERIL Take 5 mg by mouth daily.   Daliresp 250 MCG Tabs Generic drug: Roflumilast Take 250 mcg by mouth daily for 28 days. SAMPLE PROVIDED - LOT [id number] 11/08/2021   diclofenac sodium 1 % Gel Commonly known as: VOLTAREN Apply 2 g topically 4 (four) times daily as needed (knee pain).   dicyclomine 10 MG capsule Commonly known as: BENTYL Take 10 mg by mouth 4 (four) times daily -  before meals and at bedtime.   diphenoxylate-atropine 2.5-0.025 MG tablet Commonly known as: LOMOTIL Take 1 tablet by mouth 4 (four) times daily as needed for diarrhea or loose stools.   EpiPen 2-Pak 0.3 mg/0.3 mL Soaj injection Generic drug: EPINEPHrine Inject 0.3 mg into the muscle Once PRN.     estradiol 1 MG tablet Commonly known as: ESTRACE Take 1 mg by mouth daily.   gabapentin 600 MG tablet Commonly known as: NEURONTIN Take 600-1,200 mg by mouth 2 (two) times daily. 60012mn the morning and 1200m34m bedtime   hydrOXYzine 25 MG capsule Commonly known as: VISTARIL Take 25 mg by mouth 2 (two) times daily.   levalbuterol 1.25 MG/3ML nebulizer solution Commonly known as: XOPENEX Take 0.63 mg by nebulization every 6 (six) hours as needed for wheezing.   levETIRAcetam 1000 MG tablet Commonly known as: KEPPRA Take 1,000-2,000 mg by mouth 2 (two) times daily. 1000mg8mthe morning and 2000mg 78might   levocetirizine 5 MG tablet Commonly known as: XYZAL Take 1 tablet (5 mg total) by mouth daily as needed for allergies (for runny nose). What changed: when to take this   lidocaine 5 % Commonly known as: LIDODERM Place 1 patch onto the skin daily.   lisinopril 10 MG tablet Commonly known as: ZESTRIL Take 10 mg by mouth daily.   LORazepam 1 MG tablet Commonly known as: ATIVAN Take 1 mg by mouth 3 (three) times daily.   meclizine 25 MG tablet Commonly known as: ANTIVERT Take 25 mg by mouth 2 (two) times daily as needed for dizziness or nausea.   Melatonin 10 MG Tabs Take 10 mg by mouth  daily.   methylPREDNISolone 4 MG Tbpk tablet Commonly known as: MEDROL DOSEPAK follow package directions   mirtazapine 30 MG tablet Commonly known as: REMERON Take 30 mg by mouth every evening.   montelukast 10 MG tablet Commonly known as: SINGULAIR TAKE 1 TABLET BY MOUTH EVERYDAY AT BEDTIME What changed:   how much to take  how to take this  when to take this   multivitamin tablet Take 1 tablet by mouth daily.   omeprazole 40 MG capsule Commonly known as: PRILOSEC Take 40 mg by mouth 2 (two) times a day.   OXYGEN Inhale 3 L into the lungs at bedtime.   polyvinyl alcohol 1.4 % ophthalmic solution Commonly known as: LIQUIFILM TEARS Place 1 drop into both eyes  as needed for dry eyes.   promethazine-dextromethorphan 6.25-15 MG/5ML syrup Commonly known as: PROMETHAZINE-DM Take 2.5 mLs by mouth 4 (four) times daily as needed for cough.   Serevent Diskus 50 MCG/DOSE diskus inhaler Generic drug: salmeterol Inhale 1 puff into the lungs 2 (two) times daily.   sodium chloride 0.65 % Soln nasal spray Commonly known as: OCEAN Place 1 spray into both nostrils as needed for congestion.   Yupelri 175 MCG/3ML Soln Generic drug: Revefenacin Inhale 3 mLs into the lungs daily.       Follow-up Information    Your PCP and your pulmonary physician. Schedule an appointment as soon as possible for a visit in 1 week(s).           Major procedures and Radiology Reports - PLEASE review detailed and final reports thoroughly  -        CT CHEST WO CONTRAST  Result Date: 06/20/2019 CLINICAL DATA:  COVID-19 positive.  Dyspnea.  COPD. EXAM: CT CHEST WITHOUT CONTRAST TECHNIQUE: Multidetector CT imaging of the chest was performed following the standard protocol without IV contrast. COMPARISON:  Chest radiograph from earlier today. 06/19/2018 high-resolution chest CT. FINDINGS: Cardiovascular: Normal heart size. No significant pericardial effusion/thickening. Left anterior descending and right coronary atherosclerosis. Atherosclerotic nonaneurysmal thoracic aorta. Normal caliber pulmonary arteries. Mediastinum/Nodes: No discrete thyroid nodules. Unremarkable esophagus. No pathologically enlarged axillary, mediastinal or hilar lymph nodes, noting limited sensitivity for the detection of hilar adenopathy on this noncontrast study. Lungs/Pleura: No pneumothorax. No pleural effusion. Severe centrilobular emphysema with bullous emphysema in the lingula. Mild diffuse bronchial wall thickening. New mild patchy ground-glass opacities in both lungs, most prominent in the peripheral/posterior right upper lobe and peripheral left upper lobe. New small focus of patchy consolidation  and ground-glass opacity in the posteromedial basilar left lower lobe. No lung masses or significant pulmonary nodules. Upper abdomen: No acute abnormality. Musculoskeletal:  No aggressive appearing focal osseous lesions. IMPRESSION: 1. New mild patchy ground-glass opacities in both lungs. New small focus of patchy consolidation at the left lung base. Findings are compatible with COVID-19 pneumonia. 2. Severe centrilobular emphysema with bullous emphysema in the lingula and mild diffuse bronchial wall thickening, compatible with the reported history of COPD. 3. Two vessel coronary atherosclerosis. Aortic Atherosclerosis (ICD10-I70.0) and Emphysema (ICD10-J43.9). Electronically Signed   By: Ilona Sorrel M.D.   On: 06/20/2019 12:39   DG Chest Port 1 View  Result Date: 06/20/2019 CLINICAL DATA:  Shortness of breath EXAM: PORTABLE CHEST 1 VIEW COMPARISON:  June 19, 2018 chest CT. Chest radiograph February 12, 2018 FINDINGS: Lungs appear hyperexpanded. Bullous disease in the upper lobes is better seen by CT. No edema or consolidation. The heart size and pulmonary vascularity are normal. No evident adenopathy. No  bone lesions. IMPRESSION: Changes of COPD appear stable. No edema or consolidation. Stable cardiac silhouette. Electronically Signed   By: Lowella Grip III M.D.   On: 06/20/2019 08:40    Micro Results     Recent Results (from the past 240 hour(s))  Novel Coronavirus, NAA ([facility])     Status: Abnormal   Collection Time: 06/19/19  1:48 PM   Specimen: Nasopharyngeal(NP) swabs in vial transport medium   NASOPHARYNGE  TESTING  Result Value [facility] Status   SARS-CoV-2, NAA Detected (A) Not Detected Final    Comment: This nucleic acid amplification test was developed and its performance characteristics determined by [employer]. Nucleic acid amplification tests include PCR and TMA. This test has not been FDA cleared or approved. This test has been authorized by FDA under  an Emergency Use Authorization (EUA). This test is only authorized for the duration of time the declaration that circumstances exist justifying the authorization of the emergency use of in vitro diagnostic tests for detection of SARS-CoV-2 virus and/or diagnosis of COVID-19 infection under section 564(b)(1) of the Act, 21 U.S.C. 518ACZ-6(S) (1), unless the authorization is terminated or revoked sooner. When diagnostic testing is negative, the possibility of a false negative result should be considered in the context of a patient's recent exposures and the presence of clinical signs and symptoms consistent with COVID-19. An individual without symptoms of COVID-19 and who is not shedding SARS-CoV-2 virus would  expect to have a negative (not detected) result in this assay.   Blood Culture (routine x 2)     Status: None (Preliminary result)   Collection Time: 06/20/19  9:10 AM   Specimen: BLOOD LEFT HAND  Result Value Ref [facility] Status   Specimen Description   Final    BLOOD LEFT HAND Performed at [facility] [address], [place], [place] [address]    Special Requests   Final    BOTTLES DRAWN AEROBIC AND ANAEROBIC Blood Culture adequate volume Performed at [facility] [address], [place], [place] [address]    Culture   Final    NO GROWTH 2 DAYS Performed at [facility] [address] [place], [place] [address]    Report Status PENDING  Incomplete  Blood Culture (routine x 2)     Status: None (Preliminary result)   Collection Time: 06/20/19  9:15 AM   Specimen: BLOOD  Result Value Ref Range Status   Specimen Description   Final    BLOOD RIGHT ANTECUBITAL Performed at [facility], [place] [address], [place], [place] [address]    Special Requests   Final    BOTTLES DRAWN AEROBIC ONLY Blood Culture adequate volume Performed at [facility] [address], [place], [place] [address]    Culture   Final     NO GROWTH 2 DAYS Performed at [facility] [address] [place], [place] [address]    Report Status PENDING  Incomplete  Culture, expectorated sputum-assessment     Status: None   Collection Time: 06/21/19  8:44 AM   Specimen: Sputum  Result Value Ref Range Status   Specimen Description SPUTUM  Final   Special Requests NONE  Final   Sputum evaluation   Final    Sputum specimen not acceptable for testing.  Please recollect.   NOTIFIED FERRERIN,L RN _0  ON 06/21/2019 JACKSON,K Performed at [facility], [place] [address], [place], [place] [address]  Report Status 06/21/2019 FINAL  Final    Today   Subjective    Anne Rich today has no headache,no chest abdominal pain,no new weakness tingling or numbness, feels much better    Objective   Blood pressure 113/73, pulse 91, temperature 98.7 F (37.1 C), temperature source Oral, resp. rate 19, height _0  (1.727 m), weight 68.6 kg, SpO2 91 %.   Intake/Output Summary (Last 24 hours) at 06/22/2019 1027 Last data filed at 06/21/2019 1558 Gross per 24 hour  Intake 440 ml  Output 500 ml  Net -60 ml    Exam Awake Alert,  No new F.N deficits, ++ anxious affect [place].AT,PERRAL Supple Neck,No JVD, No cervical lymphadenopathy appriciated.  Symmetrical Chest wall movement, Good air movement bilaterally, CTAB RRR,No Gallops,Rubs or new Murmurs, No Parasternal Heave +ve B.Sounds, Abd Soft, Non tender, No organomegaly appriciated, No rebound -guarding or rigidity. No Cyanosis, Clubbing or edema, No new Rash or bruise   Data Review   CBC w Diff:  Lab Results  Component Value Date   WBC 9.9 06/22/2019   HGB 11.8 (L) 06/22/2019   HGB 14.7 09/23/2018   HCT 36.2 06/22/2019   HCT 44.3 09/23/2018   PLT 292 06/22/2019   PLT 380 09/23/2018   LYMPHOPCT 21 06/22/2019   MONOPCT 10 06/22/2019   EOSPCT 0 06/22/2019   BASOPCT 0 06/22/2019    CMP:  Lab Results  Component Value Date   NA 138 06/22/2019   K  3.5 06/22/2019   CL 101 06/22/2019   CO2 26 06/22/2019   BUN 10 06/22/2019   CREATININE 0.61 06/22/2019   PROT 6.2 (L) 06/22/2019   ALBUMIN 3.2 (L) 06/22/2019   BILITOT 0.5 06/22/2019   ALKPHOS 59 06/22/2019   AST 25 06/22/2019   ALT 19 06/22/2019  .   Total Time in preparing paper work, data evaluation and todays exam - 47 minutes  Lala Lund M.D on 06/22/2019 at 10:27 AM  [facility]   Office  [phone]

## 2019-06-22 NOTE — Discharge Instructions (Signed)
You are scheduled for an outpatient infusion of Remdesivir at 230PM on Tuesday 1/12 and Wednesday 1/13.  Please report to [facility] at [address]  Drive to the security guard and tell them you are here for an infusion. They will direct you to the front entrance where we will come and get you.  For questions call [phone].  Thanks   Follow with Primary MD in 7 days   Get CBC, CMP, 2 view Chest X ray -  checked next visit within 1 week by Primary MD   Activity: As tolerated with Full fall precautions use walker/cane & assistance as needed  Disposition Home    Diet: Heart Healthy    Special Instructions: If you have smoked or chewed Tobacco  in the last 2 yrs please stop smoking, stop any regular Alcohol  and or any Recreational drug use.  On your next visit with your primary care physician please Get Medicines reviewed and adjusted.  Please request your Prim.MD to go over all Hospital Tests and Procedure/Radiological results at the follow up, please get all Hospital records sent to your Prim MD by signing hospital release before you go home.  If you experience worsening of your admission symptoms, develop shortness of breath, life threatening emergency, suicidal or homicidal thoughts you must seek medical attention immediately by calling 911 or calling your MD immediately  if symptoms less severe.  You Must read complete instructions/literature along with all the possible adverse reactions/side effects for all the Medicines you take and that have been prescribed to you. Take any new Medicines after you have completely understood and accpet all the possible adverse reactions/side effects.          Person Under Monitoring Name: Anne Rich  Location: [address] Boneta Lucks [address] [place] [address]   Infection Prevention Recommendations for Individuals Confirmed to have, or Being Evaluated for, 2019 Novel Coronavirus (COVID-19) Infection Who Receive Care at  Home  Individuals who are confirmed to have, or are being evaluated for, COVID-19 should follow the prevention steps below until a healthcare provider or local or state health department says they can return to normal activities.  Stay home except to get medical care You should restrict activities outside your home, except for getting medical care. Do not go to work, school, or public areas, and do not use public transportation or taxis.  Call ahead before visiting your doctor Before your medical appointment, call the healthcare provider and tell them that you have, or are being evaluated for, COVID-19 infection. This will help the healthcare provider's office take steps to keep other people from getting infected. Ask your healthcare provider to call the local or state health department.  Monitor your symptoms Seek prompt medical attention if your illness is worsening (e.g., difficulty breathing). Before going to your medical appointment, call the healthcare provider and tell them that you have, or are being evaluated for, COVID-19 infection. Ask your healthcare provider to call the local or state health department.  Wear a facemask You should wear a facemask that covers your nose and mouth when you are in the same room with other people and when you visit a healthcare provider. People who live with or visit you should also wear a facemask while they are in the same room with you.  Separate yourself from other people in your home As much as possible, you should stay in a different room from other people in your home. Also, you should  use a separate bathroom, if available.  Avoid sharing household items You should not share dishes, drinking glasses, cups, eating utensils, towels, bedding, or other items with other people in your home. After using these items, you should wash them thoroughly with soap and water.  Cover your coughs and sneezes Cover your mouth and nose with a tissue  when you cough or sneeze, or you can cough or sneeze into your sleeve. Throw used tissues in a lined trash can, and immediately wash your hands with soap and water for at least 20 seconds or use an alcohol-based hand rub.  Wash your [employer] your hands often and thoroughly with soap and water for at least 20 seconds. You can use an alcohol-based hand sanitizer if soap and water are not available and if your hands are not visibly dirty. Avoid touching your eyes, nose, and mouth with unwashed hands.   Prevention Steps for Caregivers and Household Members of Individuals Confirmed to have, or Being Evaluated for, COVID-19 Infection Being Cared for in the Home  If you live with, or provide care at home for, a person confirmed to have, or being evaluated for, COVID-19 infection please follow these guidelines to prevent infection:  Follow healthcare provider's instructions Make sure that you understand and can help the patient follow any healthcare provider instructions for all care.  Provide for the patient's basic needs You should help the patient with basic needs in the home and provide support for getting groceries, prescriptions, and other personal needs.  Monitor the patient's symptoms If they are getting sicker, call his or her medical provider and tell them that the patient has, or is being evaluated for, COVID-19 infection. This will help the healthcare provider's office take steps to keep other people from getting infected. Ask the healthcare provider to call the local or state health department.  Limit the number of people who have contact with the patient  If possible, have only one caregiver for the patient.  Other household members should stay in another home or place of residence. If this is not possible, they should stay  in another room, or be separated from the patient as much as possible. Use a separate bathroom, if available.  Restrict visitors who do not have an  essential need to be in the home.  Keep older adults, very young children, and other sick people away from the patient Keep older adults, very young children, and those who have compromised immune systems or chronic health conditions away from the patient. This includes people with chronic heart, lung, or kidney conditions, diabetes, and cancer.  Ensure good ventilation Make sure that shared spaces in the home have good air flow, such as from an air conditioner or an opened window, weather permitting.  Wash your hands often  Wash your hands often and thoroughly with soap and water for at least 20 seconds. You can use an alcohol based hand sanitizer if soap and water are not available and if your hands are not visibly dirty.  Avoid touching your eyes, nose, and mouth with unwashed hands.  Use disposable paper towels to dry your hands. If not available, use dedicated cloth towels and replace them when they become wet.  Wear a facemask and gloves  Wear a disposable facemask at all times in the room and gloves when you touch or have contact with the patient's blood, body fluids, and/or secretions or excretions, such as sweat, saliva, sputum, nasal mucus, vomit, urine, or feces.  Ensure  the mask fits over your nose and mouth tightly, and do not touch it during use.  Throw out disposable facemasks and gloves after using them. Do not reuse.  Wash your hands immediately after removing your facemask and gloves.  If your personal clothing becomes contaminated, carefully remove clothing and launder. Wash your hands after handling contaminated clothing.  Place all used disposable facemasks, gloves, and other waste in a lined container before disposing them with other household waste.  Remove gloves and wash your hands immediately after handling these items.  Do not share dishes, glasses, or other household items with the patient  Avoid sharing household items. You should not share dishes,  drinking glasses, cups, eating utensils, towels, bedding, or other items with a patient who is confirmed to have, or being evaluated for, COVID-19 infection.  After the person uses these items, you should wash them thoroughly with soap and water.  Wash laundry thoroughly  Immediately remove and wash clothes or bedding that have blood, body fluids, and/or secretions or excretions, such as sweat, saliva, sputum, nasal mucus, vomit, urine, or feces, on them.  Wear gloves when handling laundry from the patient.  Read and follow directions on labels of laundry or clothing items and detergent. In general, wash and dry with the warmest temperatures recommended on the label.  Clean all areas the individual has used often  Clean all touchable surfaces, such as counters, tabletops, doorknobs, bathroom fixtures, toilets, phones, keyboards, tablets, and bedside tables, every day. Also, clean any surfaces that may have blood, body fluids, and/or secretions or excretions on them.  Wear gloves when cleaning surfaces the patient has come in contact with.  Use a diluted bleach solution (e.g., dilute bleach with 1 part bleach and 10 parts water) or a household disinfectant with a label that says EPA-registered for coronaviruses. To make a bleach solution at home, add 1 tablespoon of bleach to 1 quart (4 cups) of water. For a larger supply, add  cup of bleach to 1 gallon (16 cups) of water.  Read labels of cleaning products and follow recommendations provided on product labels. Labels contain instructions for safe and effective use of the cleaning product including precautions you should take when applying the product, such as wearing gloves or eye protection and making sure you have good ventilation during use of the product.  Remove gloves and wash hands immediately after cleaning.  Monitor yourself for signs and symptoms of illness Caregivers and household members are considered close contacts, should  monitor their health, and will be asked to limit movement outside of the home to the extent possible. Follow the monitoring steps for close contacts listed on the symptom monitoring form.   ? If you have additional questions, contact your local health department or call the epidemiologist on call at [phone] (available 24/7). ? This guidance is subject to change. For the most up-to-date guidance from [facility], please refer to their website: [email or web address].pl

## 2019-06-23 ENCOUNTER — Ambulatory Visit (HOSPITAL_COMMUNITY)
Admission: RE | Admit: 2019-06-23 | Discharge: 2019-06-23 | Disposition: A | Discharge status: Home / Self Care | Payer: Medicare Other | Source: Ambulatory Visit | Attending: Pulmonary Disease | Admitting: Pulmonary Disease

## 2019-06-23 VITALS — BP 115/91 | HR 93 | Temp 98.8°F | Resp 18

## 2019-06-23 DIAGNOSIS — U071 COVID-19: Secondary | ICD-10-CM | POA: Insufficient documentation

## 2019-06-23 MED ORDER — FAMOTIDINE IN NACL 20-0.9 MG/50ML-% IV SOLN: 20.0000 mg | Freq: Once | INTRAVENOUS | Status: DC | PRN

## 2019-06-23 MED ORDER — ALBUTEROL SULFATE HFA 108 (90 BASE) MCG/ACT IN AERS: 2.0000 | INHALATION_SPRAY | Freq: Once | RESPIRATORY_TRACT | Status: DC | PRN

## 2019-06-23 MED ORDER — METHYLPREDNISOLONE SODIUM SUCC 125 MG IJ SOLR: 125.0000 mg | Freq: Once | INTRAMUSCULAR | Status: DC | PRN

## 2019-06-23 MED ORDER — SODIUM CHLORIDE 0.9 % IV SOLN: 100.0000 mg | Freq: Once | INTRAVENOUS | Status: AC

## 2019-06-23 MED ORDER — EPINEPHRINE 0.3 MG/0.3ML IJ SOAJ: 0.3000 mg | Freq: Once | INTRAMUSCULAR | Status: DC | PRN

## 2019-06-23 MED ORDER — SODIUM CHLORIDE 0.9 % IV SOLN
INTRAVENOUS | Status: AC
  Administered 2019-06-23: 14:00:00 100 mg via INTRAVENOUS
  Filled 2019-06-23: qty 20

## 2019-06-23 MED ORDER — SODIUM CHLORIDE 0.9 % IV SOLN
INTRAVENOUS | Status: DC | PRN
  Administered 2019-06-23: 250 mL via INTRAVENOUS

## 2019-06-23 MED ORDER — DIPHENHYDRAMINE HCL 50 MG/ML IJ SOLN: 50.0000 mg | Freq: Once | INTRAMUSCULAR | Status: DC | PRN

## 2019-06-23 NOTE — Progress Notes (Signed)
  Diagnosis: COVID-19  Physician: Dr. Shan Levans  Procedure: [facility] Infusion Clinic Med: remdesivir infusion.  Complications: No immediate complications noted.  Discharge: Discharged home   Anne Rich 06/23/2019

## 2019-06-23 NOTE — Telephone Encounter (Signed)
I called Randell this morning.  She was discharged yesterday and was actually talking in more complete sentences than when I talked to her on Friday.  Evidently, she did receive monoclonal antibodies and is scheduled to come in today for another infusion of these.  She also has methylprednisolone which was prescribed by the hospital.  She has not started this, but I did recommend that she go ahead and do this.  We can restart her Xolair in 2 to 3 weeks.  She is going to call and remake that appointment.  She also wanted me to let our nurse practitioner, Thurston Hole, know that she was doing better.  Malachi Bonds, MD [facility]

## 2019-06-24 ENCOUNTER — Telehealth: Payer: Self-pay | Admitting: Allergy & Immunology

## 2019-06-24 ENCOUNTER — Ambulatory Visit (HOSPITAL_COMMUNITY)
Admit: 2019-06-24 | Discharge: 2019-06-24 | Disposition: A | Discharge status: Home / Self Care | Payer: Medicare Other | Attending: Pulmonary Disease | Admitting: Pulmonary Disease

## 2019-06-24 DIAGNOSIS — U071 COVID-19: Secondary | ICD-10-CM

## 2019-06-24 MED ORDER — BENZONATATE 200 MG PO CAPS: 200.0000 mg | ORAL_CAPSULE | Freq: Three times a day (TID) | ORAL | 0 refills | Status: DC | PRN

## 2019-06-24 MED ORDER — SODIUM CHLORIDE 0.9 % IV SOLN
INTRAVENOUS | Status: AC
  Filled 2019-06-24: qty 20

## 2019-06-24 MED ORDER — SODIUM CHLORIDE 0.9 % IV SOLN: INTRAVENOUS | Status: DC | PRN

## 2019-06-24 MED ORDER — METHYLPREDNISOLONE SODIUM SUCC 125 MG IJ SOLR: 125.0000 mg | Freq: Once | INTRAMUSCULAR | Status: DC | PRN

## 2019-06-24 MED ORDER — FAMOTIDINE IN NACL 20-0.9 MG/50ML-% IV SOLN: 20.0000 mg | Freq: Once | INTRAVENOUS | Status: DC | PRN

## 2019-06-24 MED ORDER — SODIUM CHLORIDE 0.9 % IV SOLN
100.0000 mg | Freq: Once | INTRAVENOUS | Status: AC
  Administered 2019-06-24: 14:00:00 100 mg via INTRAVENOUS

## 2019-06-24 MED ORDER — DIPHENHYDRAMINE HCL 50 MG/ML IJ SOLN: 50.0000 mg | Freq: Once | INTRAMUSCULAR | Status: DC | PRN

## 2019-06-24 MED ORDER — EPINEPHRINE 0.3 MG/0.3ML IJ SOAJ: 0.3000 mg | Freq: Once | INTRAMUSCULAR | Status: DC | PRN

## 2019-06-24 MED ORDER — ALBUTEROL SULFATE HFA 108 (90 BASE) MCG/ACT IN AERS: 2.0000 | INHALATION_SPRAY | Freq: Once | RESPIRATORY_TRACT | Status: DC | PRN

## 2019-06-24 NOTE — Telephone Encounter (Signed)
We can send in Tessalon pearls Q8 hours PRN. My thumb print device is not working if she wants something stronger (with opioids).   Malachi Bonds, MD [facility]

## 2019-06-24 NOTE — Telephone Encounter (Addendum)
Need to verify which strength of Tessalon Perles, 100 mg or 200 mg?  Please advise.  Left message on patients VM that Dr. Dellis Anes will send in Tessalon perles Q8 hours PRN.  Informed patient to call the clinic if she wants something stronger. (with opioids).

## 2019-06-24 NOTE — Telephone Encounter (Signed)
Let's do the 200mg . Thank you!   , MD [facility]

## 2019-06-24 NOTE — Telephone Encounter (Signed)
Keerstin called to leave message Dr Dellis Anes. She cannot stop coughing. She received her last infusion today 1/13. PT coughing all day and night. Would like medicine to help her cough.

## 2019-06-24 NOTE — Addendum Note (Signed)
Addended byClyda Greener M on: 06/24/2019 02:01 PM   Modules accepted: Orders

## 2019-06-24 NOTE — Progress Notes (Signed)
  Diagnosis: COVID-19  Physician: Dr. Wright Procedure: [facility] Infusion Clinic Med: remdesivir infusion.  Complications: No immediate complications noted.  Discharge: Discharged home   Hancel Ion L 06/24/2019   

## 2019-06-24 NOTE — Telephone Encounter (Signed)
Sent in rx for Tessalon Perles 200 mg Q8 hours PRN to [facility] on [address].   Patient informed.

## 2019-06-25 ENCOUNTER — Telehealth: Payer: Self-pay | Admitting: Critical Care Medicine

## 2019-06-25 LAB — CULTURE, BLOOD (ROUTINE X 2)
Culture: NO GROWTH
Culture: NO GROWTH
Special Requests: ADEQUATE
Special Requests: ADEQUATE

## 2019-06-25 NOTE — Telephone Encounter (Signed)
Called and spoke with Rosann Auerbach stating to her that just this once Dr. Chestine Spore will sign off on the paperwork/home health. Stated to her that pt needs to get a PCP. Per Rosann Auerbach, pt does have a new PCP but has not been able to go for an appt due to having covid. She said she would call the office to see if they could get something arranged for pt to get an appt scheduled with PCP. Nothing further needed.

## 2019-06-25 NOTE — Telephone Encounter (Signed)
Sure, but she needs a new PCP. Please place a referral to internal medicine.   Steffanie Dunn, DO 06/25/19 4:56 PM [facility]

## 2019-06-25 NOTE — Telephone Encounter (Signed)
Pt was at [facility] 1/9-1/11 and received the infusion at [facility] 1/12 and on 1/13.  Called [facility] and spoke with Trish.  Due to pt recently being hospitalized due to covid, Trish see if Dr. Chestine Spore would follow and sign off for home health for pt as pt does not have a PCP.  Dr. Chestine Spore please advise on this as they currently just need a verbal in regards to this.

## 2019-06-29 ENCOUNTER — Ambulatory Visit: Payer: Self-pay

## 2019-06-30 ENCOUNTER — Telehealth: Payer: Self-pay | Admitting: Allergy & Immunology

## 2019-06-30 NOTE — Telephone Encounter (Signed)
Another telephone encounter was entered today for same issue and addressed by Dr. Beaulah Dinning. Per Dr. Beaulah Dinning: She could try Robitussin-DM.  It is over-the-counter.  She should continue on all her medications but if she has shortness of breath she should be seen by an urgent care center such as [facility] health in [place] to make sure she does not have COVID-19 in her lungs  Left message on patients voice mail regarding Dr. Beaulah Dinning recommendations.

## 2019-06-30 NOTE — Telephone Encounter (Addendum)
Patient called.  States the cough medication she is using is not working.  I called patient to ask what cough medication she is taking and if she took Tessalon Perles 200 mg.  Left message for patient to call clinic.

## 2019-06-30 NOTE — Telephone Encounter (Signed)
Another encounter was entered in Epic today for same issue.  Left message on patients VM to call clinic to review Dr. Beaulah Dinning recommendations.

## 2019-06-30 NOTE — Telephone Encounter (Signed)
Left pt. A message. It looks like she is taking the tessalon pearls 200 mg capsules for her cough. She has been diagnosed with COVID-19 She would like something else for her cough.

## 2019-06-30 NOTE — Telephone Encounter (Signed)
She could try Robitussin-DM.  It is over-the-counter.  She should continue on all her medications but if she has shortness of breath she should be seen by an urgent care center such as [facility] health in [place] to make sure she does not have COVID-19 in her lungs

## 2019-06-30 NOTE — Telephone Encounter (Signed)
PT called to see if she can get a new cough medicine because the one she is using now is not working & she is throwing up.

## 2019-07-01 NOTE — Telephone Encounter (Signed)
Calling to find out how patient is breathing and if she is still coughing. Left a message for her to call the clinic.

## 2019-07-01 NOTE — Telephone Encounter (Signed)
Per Thurston Hole (note from additional encounter today):  Calling to find out how patient is breathing and if she is still coughing. Left a message for her to call the clinic.

## 2019-07-02 NOTE — Telephone Encounter (Addendum)
Patient returned call.  She is feeling some better.  She does have bad coughing spells at times.  A lot of green mucus when she does cough.  Nausea when eating.  Her breathing is okay as long as she does not walk long distances.  She is only walking around her apartment and was able to walk down the hall at her assisted living complex today and as long as she has her oxygen on, seems to do okay.  No fever. No chills. Her assisted living team checks her temperature 4 times a week. Patient does feel a little better as to feeling stronger since COVID diagnosis.  Patient feels she needs to cough this green mucus up and does not want a medication to suppress the cough now. Patient would like to find out when she can resume her Xolair injections. Please advise.

## 2019-07-02 NOTE — Telephone Encounter (Signed)
Per Thurston Hole, try to call patient emergency contact as we have been unable to contact patient.  Returning call from patient on 06/30/2019 regarding her coughing. Left message on emergency contact, Nehemiah Massed, patient son, voice mail for him to call our office or have patient call our office for update on her status.

## 2019-07-02 NOTE — Telephone Encounter (Signed)
Left message x 3 for patient to call clinic back per Thermon Leyland to find out how patient is breathing and status on cough.

## 2019-07-02 NOTE — Telephone Encounter (Signed)
Per Thurston Hole, try to call patient emergency contact as we have been unable to contact patient.  Returning call from patient on 06/30/2019 regarding her coughing. Left message on emergency contact, Nehemiah Massed, patient son, voice mail for him to call our office or have patient call our office for update on her status.   WILL CLOSE THIS ENCOUNTER OUT.  PLEASE SEE TELEPHONE ENCOUNTER FROM 06/30/2019 FOR UPDATES ON THIS TELEPHONE CALL TO PATIENT.

## 2019-07-02 NOTE — Telephone Encounter (Signed)
Left message x 3 for patient to call clinic back per Anne Ambs to find out how patient is breathing and status on cough. 

## 2019-07-03 NOTE — Telephone Encounter (Signed)
Please have her call next week with how she is feeling. If she is asymptomatic she can get Xolair next week. Thank you

## 2019-07-03 NOTE — Telephone Encounter (Signed)
Called patient.  Gave information.  She will call clinic next week to schedule Xolair injection as long as she is feeling well.

## 2019-07-03 NOTE — Telephone Encounter (Addendum)
Patient called this morning to check if Thurston Hole had any additional therapy the patient should do.  She is scheduled to see her PCP today.  She thinks he may do another COVID test.  Should patient continue Mucinex to help her cough up the green mucus from her lungs?  Resume Xolair? Please call patient.  Her phone somehow blocks our office number.  She states we need to call her from a cell phone to contact her.

## 2019-07-24 ENCOUNTER — Ambulatory Visit: Payer: Self-pay

## 2019-07-31 ENCOUNTER — Ambulatory Visit: Payer: Self-pay

## 2019-08-04 DIAGNOSIS — J4550 Severe persistent asthma, uncomplicated: Secondary | ICD-10-CM

## 2019-08-05 ENCOUNTER — Ambulatory Visit (INDEPENDENT_AMBULATORY_CARE_PROVIDER_SITE_OTHER): Payer: Medicare Other

## 2019-08-05 ENCOUNTER — Other Ambulatory Visit: Payer: Self-pay

## 2019-08-05 DIAGNOSIS — J4550 Severe persistent asthma, uncomplicated: Secondary | ICD-10-CM | POA: Diagnosis not present

## 2019-08-05 DIAGNOSIS — J4551 Severe persistent asthma with (acute) exacerbation: Secondary | ICD-10-CM

## 2019-08-18 DIAGNOSIS — J4540 Moderate persistent asthma, uncomplicated: Secondary | ICD-10-CM | POA: Diagnosis not present

## 2019-08-19 ENCOUNTER — Ambulatory Visit (INDEPENDENT_AMBULATORY_CARE_PROVIDER_SITE_OTHER): Payer: Medicare Other

## 2019-08-19 ENCOUNTER — Other Ambulatory Visit: Payer: Self-pay

## 2019-08-19 DIAGNOSIS — J4540 Moderate persistent asthma, uncomplicated: Secondary | ICD-10-CM

## 2019-09-01 DIAGNOSIS — J4540 Moderate persistent asthma, uncomplicated: Secondary | ICD-10-CM | POA: Diagnosis not present

## 2019-09-02 ENCOUNTER — Other Ambulatory Visit: Payer: Self-pay

## 2019-09-02 ENCOUNTER — Ambulatory Visit (INDEPENDENT_AMBULATORY_CARE_PROVIDER_SITE_OTHER): Payer: Medicare Other

## 2019-09-02 DIAGNOSIS — J4540 Moderate persistent asthma, uncomplicated: Secondary | ICD-10-CM

## 2019-09-11 DIAGNOSIS — J4540 Moderate persistent asthma, uncomplicated: Secondary | ICD-10-CM | POA: Diagnosis not present

## 2019-09-14 ENCOUNTER — Ambulatory Visit: Payer: Medicare Other | Admitting: Allergy & Immunology

## 2019-09-14 ENCOUNTER — Other Ambulatory Visit: Payer: Self-pay

## 2019-09-14 ENCOUNTER — Encounter: Payer: Self-pay | Admitting: Allergy & Immunology

## 2019-09-14 ENCOUNTER — Ambulatory Visit (INDEPENDENT_AMBULATORY_CARE_PROVIDER_SITE_OTHER): Payer: Medicare Other

## 2019-09-14 VITALS — BP 144/70 | HR 106 | Temp 97.8°F | Resp 16 | Ht 68.0 in | Wt 148.4 lb

## 2019-09-14 DIAGNOSIS — J449 Chronic obstructive pulmonary disease, unspecified: Secondary | ICD-10-CM

## 2019-09-14 DIAGNOSIS — Z9981 Dependence on supplemental oxygen: Secondary | ICD-10-CM | POA: Diagnosis not present

## 2019-09-14 DIAGNOSIS — J3089 Other allergic rhinitis: Secondary | ICD-10-CM

## 2019-09-14 DIAGNOSIS — K219 Gastro-esophageal reflux disease without esophagitis: Secondary | ICD-10-CM

## 2019-09-14 DIAGNOSIS — J4540 Moderate persistent asthma, uncomplicated: Secondary | ICD-10-CM

## 2019-09-14 MED ORDER — ALBUTEROL SULFATE HFA 108 (90 BASE) MCG/ACT IN AERS: 2.0000 | INHALATION_SPRAY | RESPIRATORY_TRACT | 1 refills | Status: DC | PRN

## 2019-09-14 NOTE — Progress Notes (Signed)
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 Moderate persistent asthma with COPD overlap - Lung testing looked low again today, but this is your new baseline.  - Daily controller medication(s): Pulmicort + Brovana twice daily via nebulizer + Yupelri once daily via nebulizer + albuterol twice daily via nebulizer + Xolair every two weeks  - Rescue medications: Xopenex (levalbuterol) + Atrovent (ipratropium) mixed together every 6 hours as needed via nebulizer - Asthma control goals:  * Full participation in all desired activities (may need albuterol before activity) * Albuterol use two time or less a week on average (not counting use with activity) * Cough interfering with sleep two time or less a month * Oral steroids no more than once a year * No hospitalizations  2. Allergic rhinitis - did allergy shots for three years - Continue with montelukast 10mg  daily.  3. Return in about 4 months (around 01/14/2020). This can be an in-person, a virtual Webex or a telephone follow up visit.  Subjective:   James Lafalce is a 69 y.o. female presenting today for follow up of  Chief Complaint  Patient presents with  . Allergic Rhinitis   . Asthma  . Gastroesophageal Reflux    Kimbery Harwood has a history of the following: Patient Active Problem List   Diagnosis Date Noted  . COVID-19 virus infection 06/20/2019  . Asthma with COPD with exacerbation (HCC) 06/20/2019  . Medication management 05/29/2019  . Severe persistent asthma without complication 03/16/2019  . Physical deconditioning 01/06/2019  . Counseling regarding end of life decision making 01/06/2019  .  Moderate persistent asthma with acute exacerbation 09/09/2018  . Lumbosacral dysfunction 09/04/2018  . Chronic respiratory failure with hypoxia (HCC), 3L home O2 05/01/2018  . Statin intolerance 02/13/2018  . Moderate persistent asthma, uncomplicated 01/22/2018  . Adverse effect of other drugs, medicaments and biological substances, initial encounter 01/01/2018  . Restrictive airway disease 12/09/2017  . Bilateral carotid artery stenosis 10/14/2017  . Spells of decreased attentiveness 09/10/2017  . Cortical age-related cataract of left eye 10/24/2016  . Nuclear sclerotic cataract of left eye 10/24/2016  . History of epistaxis 07/19/2016  . Advance directive in chart 04/04/2016  . Idiopathic peripheral neuropathy 03/12/2016  . Loss of appetite 03/12/2016  . Psychophysiological insomnia 03/12/2016  . Oxygen dependent 02/28/2016  . Easy bruising 12/26/2015  . Gastroesophageal reflux disease 12/02/2015  . Coronary artery calcification seen on CT scan 09/20/2015  . 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HCC) 09/20/2015  . Abnormal weight loss 09/06/2015  . Herniated lumbar intervertebral disc 07/21/2015  . Migraine without aura and without status migrainosus, not intractable 07/21/2015  . Anxiety 06/28/2015  . Chronic constipation 06/28/2015  . Chronic use of benzodiazepine for therapeutic purpose 06/28/2015  . Former smoker 06/28/2015  . Hepatic steatosis 06/28/2015  . History of cerebrovascular accident (CVA) with residual deficit 06/28/2015  . 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 Hypoxemia 06/28/2015  . 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 Osteopenia 06/28/2015  . Unspecified convulsions (HCC) 06/28/2015  . Acute poliomyelitis, unspecified 04/18/2015  . CVA (cerebral vascular accident) (HCC) 04/18/2015  . Essential hypertension 04/18/2015  . Other allergic rhinitis 02/10/2015  . 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  Hip pain 07/08/2013  . Melena 04/22/2013  .  Hypercholesterolemia 02/12/2013  . Depressive disorder 11/15/2012  . 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 Low back pain 06/18/2012  . 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 Right foot pain 06/18/2012  . Onychomycosis 03/21/2012  . Pain, chronic 07/13/2011  . Shortness of breath 02/13/2011    History obtained from: chart review and patient.  Adelei is a 69 y.o. female presenting for a follow up visit.  She was last seen in January 2021 via a televisit.  At that time, we spent a lot of time calming her down.  She had a known COVID-19 exposure.  She did get tested and after the visit was diagnosed as positive.  We did get her set up for remdesivir infusions.  She had a persistent cough for several weeks after that.  However, she did not need to be hospitalized for a prolonged period of time.  Since the last visit, she has mostly done well. Now that she has recovered from her COVID19 infection, she is doing rather well. She is somewhat short of breath today because she helped with bingo last night.  Asthma/Respiratory Symptom History: She is now seeing Dr. Malvin Johns at [facility]. She did try to see Dr. Su Monks but she did not have openings. She has see Dr. Malvin Johns three times now. She was told that she needs oxygen throughout the day. She carries her oxygen but she does not use it. She is doing the Pulmicort + Brovana twice daily as well as Yupelri daily via nebulizer. She has NOT had her COVID19 vaccine. She was told to wait three months. She is wanting to get it as soon as she can.   Allergic Rhinitis Symptom History: She remains on the Xyzal 5 mg daily.  She is also on Singulair as well as nasal saline rinses.  She also has Mucinex to use as needed.  She is wondering about restarting her allergy shots today.  She does feel worse when she is off of them.  However, when we discussed her lung function she decided to hold off.  Otherwise, there have been no changes to  her past medical history, surgical history, family history, or social history.    Review of Systems  Constitutional: Negative.  Negative for chills, fever, malaise/fatigue and weight loss.  HENT: Negative.  Negative for congestion, ear discharge, ear pain, sinus pain and sore throat.   Eyes: Negative for pain, discharge and redness.  Respiratory: Positive for cough, shortness of breath and wheezing. Negative for sputum production.   Cardiovascular: Negative.  Negative for chest pain and palpitations.  Gastrointestinal: Negative for abdominal pain, constipation, diarrhea, heartburn, nausea and vomiting.  Skin: Negative.  Negative for itching and rash.  Neurological: Negative for dizziness and headaches.  Endo/Heme/Allergies: Negative for environmental allergies. Does not bruise/bleed easily.       Objective:   Blood pressure (!) 144/70, pulse (!) 106, temperature 97.8 F (36.6 C), temperature source Oral, resp. rate 16, height 5\' 8"  (1.727 m), weight 148 lb 5.9 oz (67.3 kg), SpO2 94 %. Body mass index is 22.56 kg/m.   Physical Exam:  Physical Exam  Constitutional: She appears well-developed.  Well coiffed female. Has an oxygen tank with her but she is not using it at all today.   HENT:  Head: Normocephalic and atraumatic.  Right Ear: Tympanic membrane, external ear and ear canal normal.  Left Ear: Tympanic membrane, external ear and ear canal normal.  Nose: Rhinorrhea present. No mucosal edema, nasal deformity or septal deviation. No epistaxis. Right sinus exhibits no maxillary sinus tenderness and no frontal sinus tenderness. Left sinus exhibits no maxillary sinus tenderness and no frontal sinus tenderness.  Mouth/Throat: Uvula is midline and oropharynx is clear and moist. Mucous membranes are not pale and not dry.  Eyes: Pupils are equal, round, and reactive to light. Conjunctivae and EOM are normal. Right eye exhibits no chemosis and no discharge. Left eye exhibits no chemosis  and no discharge. Right conjunctiva is not injected. Left conjunctiva is not injected.  Cardiovascular: Normal rate, regular rhythm and normal heart sounds.  Respiratory: Effort normal and breath sounds normal. No accessory muscle usage. No tachypnea. No respiratory distress. She has no wheezes. She has no rhonchi. She has no rales. She exhibits no tenderness.  Decrease air movement at the bases, isolated expiratory and inspiratory wheezing, but certainly not to the same extent that I have heard it in the past.   Lymphadenopathy:    She has no cervical adenopathy.  Neurological: She is alert.  Skin: No abrasion, no petechiae and no rash noted. Rash is not papular, not vesicular and not urticarial. No erythema. No pallor.  Psychiatric: She has a normal mood and affect.     Diagnostic studies:    Spirometry: results abnormal (FEV1: 0.73/27%, FVC: 1.87/53%, FEV1/FVC: 39%).    Spirometry consistent with mixed obstructive and restrictive disease.   Allergy Studies: none        Salvatore Marvel, MD  [facility] [place] [facility]

## 2019-09-14 NOTE — Patient Instructions (Addendum)
1. Moderate persistent asthma with COPD overlap - Lung testing looked low again today, but this is your new baseline.  - Daily controller medication(s): Pulmicort + Brovana twice daily via nebulizer + Yupelri once daily via nebulizer + albuterol twice daily via nebulizer + Xolair every two weeks  - Rescue medications: Xopenex (levalbuterol) + Atrovent (ipratropium) mixed together every 6 hours as needed via nebulizer - Asthma control goals:  * Full participation in all desired activities (may need albuterol before activity) * Albuterol use two time or less a week on average (not counting use with activity) * Cough interfering with sleep two time or less a month * Oral steroids no more than once a year * No hospitalizations  2. Allergic rhinitis - did allergy shots for three years - Continue with montelukast 10mg  daily.  3. Return in about 4 months (around 01/14/2020). This can be an in-person, a virtual Webex or a telephone follow up visit.   Please inform 03/15/2020 of any Emergency Department visits, hospitalizations, or changes in symptoms. Call us before going to the ED for breathing or allergy symptoms since we might be able to fit you in for a sick visit. Feel free to contact us anytime with any questions, problems, or concerns.  It was a pleasure to see you again today!  Websites that have reliable patient information: 1. American Academy of Asthma, Allergy, and Immunology: www.aaaai.org 2. Food Allergy Research and Education (FARE): foodallergy.org 3. Mothers of Asthmatics: http://www.asthmacommunitynetwork.org 4. American College of Allergy, Asthma, and Immunology: www.acaai.org   COVID-19 Vaccine Information can be found at: [place] For questions related to vaccine distribution or appointments, please email [email or web address] or call [phone].     "Like" [phone] on Facebook and Instagram for our latest updates!         HAPPY SPRING!  Make sure you are registered to vote! If you have moved or changed any of your contact information, you will need to get this updated before voting!  In some cases, you MAY be able to register to vote online: [place]

## 2019-09-16 ENCOUNTER — Ambulatory Visit: Payer: Self-pay

## 2019-09-16 ENCOUNTER — Other Ambulatory Visit: Payer: Self-pay | Admitting: Allergy & Immunology

## 2019-09-16 DIAGNOSIS — J449 Chronic obstructive pulmonary disease, unspecified: Secondary | ICD-10-CM

## 2019-09-25 DIAGNOSIS — J4540 Moderate persistent asthma, uncomplicated: Secondary | ICD-10-CM | POA: Diagnosis not present

## 2019-09-28 ENCOUNTER — Other Ambulatory Visit: Payer: Self-pay

## 2019-09-28 ENCOUNTER — Ambulatory Visit (INDEPENDENT_AMBULATORY_CARE_PROVIDER_SITE_OTHER): Payer: Medicare Other

## 2019-09-28 DIAGNOSIS — J4540 Moderate persistent asthma, uncomplicated: Secondary | ICD-10-CM

## 2019-10-08 ENCOUNTER — Telehealth: Payer: Self-pay | Admitting: Allergy & Immunology

## 2019-10-08 NOTE — Telephone Encounter (Signed)
Called and left a voicemail for patient to call back to get more information.

## 2019-10-08 NOTE — Telephone Encounter (Signed)
Pt has coughing and feels bad taking all prescribed meds. Please advise.

## 2019-10-09 DIAGNOSIS — J4540 Moderate persistent asthma, uncomplicated: Secondary | ICD-10-CM

## 2019-10-09 NOTE — Telephone Encounter (Signed)
Left a voicemail for pt to return call. What medications is she taking? Symptoms?

## 2019-10-12 ENCOUNTER — Other Ambulatory Visit: Payer: Self-pay

## 2019-10-12 ENCOUNTER — Ambulatory Visit (INDEPENDENT_AMBULATORY_CARE_PROVIDER_SITE_OTHER): Payer: Medicare Other | Admitting: *Deleted

## 2019-10-12 DIAGNOSIS — J4540 Moderate persistent asthma, uncomplicated: Secondary | ICD-10-CM

## 2019-10-16 ENCOUNTER — Telehealth: Payer: Self-pay | Admitting: Allergy & Immunology

## 2019-10-16 NOTE — Telephone Encounter (Signed)
Patient asks for refill of Promethazine-dm.  You can call using home or cell  and leave a message if necessary.  [phone](UTML)    Pharmacy is  [facility] [address]

## 2019-10-16 NOTE — Telephone Encounter (Signed)
Can we give refill for this?

## 2019-10-19 ENCOUNTER — Other Ambulatory Visit: Payer: Self-pay

## 2019-10-19 MED ORDER — PROMETHAZINE-DM 6.25-15 MG/5ML PO SYRP: 2.5000 mL | ORAL_SOLUTION | Freq: Four times a day (QID) | ORAL | 0 refills | Status: DC | PRN

## 2019-10-19 NOTE — Telephone Encounter (Signed)
Yes that is fine, but with no refills.   Malachi Bonds, MD [facility]

## 2019-10-19 NOTE — Telephone Encounter (Signed)
Sent in rx with no refills 

## 2019-10-26 ENCOUNTER — Ambulatory Visit: Payer: Self-pay

## 2019-11-02 ENCOUNTER — Ambulatory Visit: Payer: Self-pay

## 2019-11-03 DIAGNOSIS — J4540 Moderate persistent asthma, uncomplicated: Secondary | ICD-10-CM

## 2019-11-04 ENCOUNTER — Ambulatory Visit (INDEPENDENT_AMBULATORY_CARE_PROVIDER_SITE_OTHER): Payer: Medicare Other | Admitting: *Deleted

## 2019-11-04 ENCOUNTER — Other Ambulatory Visit: Payer: Self-pay

## 2019-11-04 DIAGNOSIS — J4540 Moderate persistent asthma, uncomplicated: Secondary | ICD-10-CM | POA: Diagnosis not present

## 2019-11-07 ENCOUNTER — Other Ambulatory Visit: Payer: Self-pay | Admitting: Allergy

## 2019-11-07 NOTE — Progress Notes (Signed)
She reports she has been having trouble breathing.  She states her AC has been out for a month and it was just fixed on Thursday.  When it finally started working she has been having difficulty breathing.  She also states Friday she got choked up on a medication.   2 weeks ago she states she was treated by her PCP with antibiotic 10 days and 5mg  prednisone.   She states she was coughing up "green stuff".  She does not remember which antibiotic.  She is still taking the 5mg  prednisone.  She leaves in a living facility and states there are several residents there with PNA.   She is having trouble breathing and states she was not able to do her regular walk.  She needed to use her rescue inhaler this morning.   No fevers.   She states she has 10mg  prednisone tabs at home.  Advised her to do an increased steroid burst of 20mg  BID x 2 days, 10mg  BID x 2 days and 10mg  once and done.   Advised will have office check on her Tuesday.

## 2019-11-10 ENCOUNTER — Other Ambulatory Visit: Payer: Self-pay

## 2019-11-10 ENCOUNTER — Encounter: Payer: Self-pay | Admitting: Allergy & Immunology

## 2019-11-10 ENCOUNTER — Ambulatory Visit (INDEPENDENT_AMBULATORY_CARE_PROVIDER_SITE_OTHER): Payer: Medicare Other | Admitting: Allergy & Immunology

## 2019-11-10 DIAGNOSIS — J441 Chronic obstructive pulmonary disease with (acute) exacerbation: Secondary | ICD-10-CM

## 2019-11-10 DIAGNOSIS — J4550 Severe persistent asthma, uncomplicated: Secondary | ICD-10-CM

## 2019-11-10 DIAGNOSIS — Z9981 Dependence on supplemental oxygen: Secondary | ICD-10-CM

## 2019-11-10 DIAGNOSIS — J45901 Unspecified asthma with (acute) exacerbation: Secondary | ICD-10-CM | POA: Diagnosis not present

## 2019-11-10 DIAGNOSIS — K219 Gastro-esophageal reflux disease without esophagitis: Secondary | ICD-10-CM

## 2019-11-10 MED ORDER — PREDNISONE 10 MG PO TABS: ORAL_TABLET | ORAL | 0 refills | Status: AC

## 2019-11-10 NOTE — Progress Notes (Signed)
Called and spoke with patient and she was still having some chest tightness and hoarse. She has been scheduled for a televisit today with Dr. Dellis Anes.

## 2019-11-10 NOTE — Progress Notes (Signed)
RE: Anne Rich MRN: [id number] DOB: 08-Aug-1950 Date of Telemedicine Visit: 11/10/2019  Referring provider: No ref. provider found Primary care provider: Doreen Salvage, PA-C  Chief Complaint: Asthma (sob for about a month, a/c not working in home )   Telemedicine Follow Up Visit via Telephone: I connected with Anne Rich for a follow up on 11/10/19 by telephone and verified that I am speaking with the correct person using two identifiers.   I discussed the limitations, risks, security and privacy concerns of performing an evaluation and management service by telephone and the availability of in person appointments. I also discussed with the patient that there may be a patient responsible charge related to this service. The patient expressed understanding and agreed to proceed.  Patient is at home.  Provider is at the office.  Visit start time: 9:55 PM Visit end time: 10:20 AM Insurance consent/check in by: DeShaunia Medical consent and medical assistant/nurse: DeShaunia  History of Present Illness:  She is a 69 y.o. female, who is being followed for asthma COPD overlap as well as perennial allergic rhinitis and reflux. Her previous allergy office visit was in April 2021 with myself.  At that visit, her lung testing was still low again.  However, we felt that this was a new baseline.  We continued Pulmicort plus Brovana twice daily as well as Yupelri once daily and albuterol twice daily.  She also remained on Xolair every 2 weeks.  We continued her montelukast for her allergic rhinitis.  Over the weekend, she called the on-call doctor and was started on prednisone.  She was already on an antibiotic. She was actually on prednisone as well, but only 5 mg daily for a number of days.  She did not feel that the 5 mg was doing much of anything. She felt better after starting the 40mg  of prednisone and she felt better after that. But she tells me that she felt worse today with wheezing and  generalized malaise.  She thinks she might need a more prolonged taper.  She tells me that she started having problems with the [facility] stopped working in the middle of May. She was asking the manager to fix the [facility] unit but was having trouble getting the work done. She reports that her apartment manager is now involved and it has been fixed. She is no longer living in the muggy conditions but she reports that she had an "infection in [her] lungs".  Otherwise, there have been no changes to her past medical history, surgical history, family history, or social history.  Assessment and Plan:  Anne Rich is a 69 y.o. female with:  Severe persistent asthmawith COPD overlapwith acute exacerbation  Oxygen dependent-followed by pulmonology  Perennial allergic rhinitis- s/p3 years of allergen immunotherapy  Gastroesophageal reflux disease  COVID-19 positive in January 2021 - s/p remdesivir    Anne Rich is doing somewhat better after getting started on prednisone over the weekend, but with the wean on the prednisone her symptoms have returned.  We are to put her on a slower prednisone taper: 40 mg daily for 4 days, 30 mg daily for 4 days, 20 mg daily for 4 days, 10 mg daily for 4 days, and then stop.  We will not make any changes to her maintenance medications.  We will see her again in a couple of months to make sure she is still doing well.  She has received both COVID-19 vaccines.   Diagnostics: None.  Medication List:  Current Outpatient Medications  Medication Sig Dispense Refill   albuterol (VENTOLIN HFA) 108 (90 Base) MCG/ACT inhaler Inhale 2 puffs into the lungs every 4 (four) hours as needed for wheezing or shortness of breath. 36 g 1   arformoterol (BROVANA) 15 MCG/2ML NEBU Take 2 mLs (15 mcg total) by nebulization 2 (two) times daily. 120 mL 11   aspirin 81 MG tablet Take 81 mg by mouth daily.     b complex vitamins capsule Take 1 capsule by mouth daily.     budesonide  (PULMICORT) 0.5 MG/2ML nebulizer solution Take 2 mLs (0.5 mg total) by nebulization 2 (two) times daily. This replaces symbicort 120 mL 11   carbamazepine (TEGRETOL) 200 MG tablet Take 200-400 mg by mouth 2 (two) times daily. 200mg  tab in the morning and 400mg  at night     cyclobenzaprine (FLEXERIL) 5 MG tablet Take 5 mg by mouth daily.     diclofenac sodium (VOLTAREN) 1 % GEL Apply 2 g topically 4 (four) times daily as needed (knee pain).      dicyclomine (BENTYL) 10 MG capsule Take 10 mg by mouth 4 (four) times daily -  before meals and at bedtime.      diphenoxylate-atropine (LOMOTIL) 2.5-0.025 MG tablet Take 1 tablet by mouth 4 (four) times daily as needed for diarrhea or loose stools.     EPINEPHrine (EPIPEN 2-PAK) 0.3 mg/0.3 mL IJ SOAJ injection Inject 0.3 mg into the muscle Once PRN.     estradiol (ESTRACE) 1 MG tablet Take 1 mg by mouth daily.  3   gabapentin (NEURONTIN) 600 MG tablet Take 600-1,200 mg by mouth 2 (two) times daily. 600mg  in the morning and 1200mg  at bedtime     hydrOXYzine (VISTARIL) 25 MG capsule Take 25 mg by mouth 2 (two) times daily.     levalbuterol (XOPENEX) 1.25 MG/3ML nebulizer solution Take 0.63 mg by nebulization every 6 (six) hours as needed for wheezing.      lidocaine (LIDODERM) 5 % Place 1 patch onto the skin daily.      lisinopril (PRINIVIL,ZESTRIL) 10 MG tablet Take 10 mg by mouth daily.      LORazepam (ATIVAN) 1 MG tablet Take 1 mg by mouth 3 (three) times daily.     meclizine (ANTIVERT) 25 MG tablet Take 25 mg by mouth 2 (two) times daily as needed for dizziness or nausea.      Melatonin 10 MG TABS Take 10 mg by mouth daily.     montelukast (SINGULAIR) 10 MG tablet TAKE 1 TABLET BY MOUTH EVERYDAY AT BEDTIME 90 tablet 1   Multiple Vitamin (MULTIVITAMIN) tablet Take 1 tablet by mouth daily.      omeprazole (PRILOSEC) 40 MG capsule Take 40 mg by mouth 2 (two) times a day.     OXYGEN Inhale 3 L into the lungs at bedtime. 4 L when walking       polyvinyl alcohol (LIQUIFILM TEARS) 1.4 % ophthalmic solution Place 1 drop into both eyes as needed for dry eyes.     promethazine-dextromethorphan (PROMETHAZINE-DM) 6.25-15 MG/5ML syrup Take 2.5 mLs by mouth 4 (four) times daily as needed for cough. 118 mL 0   Revefenacin (YUPELRI) 175 MCG/3ML SOLN Inhale 3 mLs into the lungs daily. 90 mL 11   sodium chloride (OCEAN) 0.65 % SOLN nasal spray Place 1 spray into both nostrils as needed for congestion.     benzonatate (TESSALON) 200 MG capsule Take 1 capsule (200 mg total) by mouth every 8 (eight) hours as needed for cough. (  Patient not taking: Reported on 09/14/2019) 20 capsule 0   levETIRAcetam (KEPPRA) 1000 MG tablet Take 1,000-2,000 mg by mouth 2 (two) times daily. 1000mg  in the morning and 2000mg  at night     levocetirizine (XYZAL) 5 MG tablet Take 1 tablet (5 mg total) by mouth daily as needed for allergies (for runny nose). (Patient taking differently: Take 5 mg by mouth daily. ) 30 tablet 5   methylPREDNISolone (MEDROL DOSEPAK) 4 MG TBPK tablet follow package directions 21 tablet 0   mirtazapine (REMERON) 30 MG tablet Take 30 mg by mouth every evening.      Roflumilast (DALIRESP) 250 MCG TABS Take 250 mcg by mouth daily for 28 days. SAMPLE PROVIDED - LOT EXP 11/08/2021 28 tablet 0   salmeterol (SEREVENT DISKUS) 50 MCG/DOSE diskus inhaler Inhale 1 puff into the lungs 2 (two) times daily. (Patient not taking: Reported on 11/10/2019) 1 Inhaler 12   Current Facility-Administered Medications  Medication Dose Route Frequency Provider Last Rate Last Admin   omalizumab 11/10/2021) injection 150 mg  150 mg Subcutaneous Q14 Days 01/10/2020, MD   150 mg at 11/04/19 Alfonse Spruce   Allergies: Allergies  Allergen Reactions   Iodine Itching, Rash and Swelling    IV contrast    Iodine-131 Itching and Swelling    Pt states causes severe itching Pt states causes severe itching    Penicillins Hives and Swelling    Swelling of the  throat   Pineapple Other (See Comments) and Swelling    Pt states causes her throat swelling Pt states causes her throat swelling    Shellfish Allergy Rash and Swelling    Had reaction to cardiac cath dye  Had seizure ,rash ,itch Oct. 2012 Had reaction to cardiac cath dye  Had seizure ,rash ,itch Oct. 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  Review of Systems  Constitutional: Negative for activity change, appetite change, chills, fatigue and fever.  HENT: Negative for congestion, postnasal drip, rhinorrhea, sinus pressure, sinus pain, sneezing and sore throat.   Eyes: Negative for pain, discharge, redness and itching.  Respiratory: Positive for shortness of breath and wheezing. Negative for stridor.   Gastrointestinal: Negative for diarrhea, nausea and vomiting.  Endocrine: Negative for cold intolerance and heat intolerance.  Musculoskeletal: Negative for arthralgias, joint swelling and myalgias.  Skin: Negative for rash.  Allergic/Immunologic: Negative for environmental allergies and food allergies.    Objective:  Physical exam not obtained as encounter was done via telephone.   Previous notes and tests were reviewed.  I discussed the assessment and treatment plan with the patient. The patient was provided an opportunity to ask questions and all were answered. The patient agreed with the plan and demonstrated an understanding of the instructions.   The patient was advised to call back or seek an in-person evaluation if the symptoms worsen or if the condition fails to improve as anticipated.  I  provided 25 minutes of non-face-to-face time during this encounter.  It was my pleasure to participate in [facility] care today. Please feel free to contact me with any questions  or concerns.   Sincerely,  Alfonse Spruce, MD

## 2019-11-11 ENCOUNTER — Encounter: Payer: Self-pay | Admitting: Allergy & Immunology

## 2019-11-11 NOTE — Progress Notes (Signed)
Yes, I talked to her. Please call her to see how she is doing today.   Malachi Bonds, MD [facility]

## 2019-11-11 NOTE — Progress Notes (Signed)
Called and left a message for patient to call the office in regards to seeing how she is feeling today.

## 2019-11-13 ENCOUNTER — Telehealth: Payer: Self-pay | Admitting: Allergy & Immunology

## 2019-11-13 NOTE — Progress Notes (Signed)
Per Fae Pippin patient called back and informed her that her back and ribs are sore, hard to breate, took prednisone and mucinex, was able to walk but still sore.

## 2019-11-13 NOTE — Telephone Encounter (Signed)
Dr. Dellis Anes has been informed via orders only encounter on 11/07/2019. Please see the encounter on 11/07/2019 for more information in regards to this matter.

## 2019-11-13 NOTE — Telephone Encounter (Signed)
back and ribs are sore, hard to breate, took prednisone and mucinex, was able to walk but still sore

## 2019-11-17 DIAGNOSIS — J4550 Severe persistent asthma, uncomplicated: Secondary | ICD-10-CM | POA: Diagnosis not present

## 2019-11-18 ENCOUNTER — Ambulatory Visit (INDEPENDENT_AMBULATORY_CARE_PROVIDER_SITE_OTHER): Payer: Medicare Other | Admitting: *Deleted

## 2019-11-18 DIAGNOSIS — J4550 Severe persistent asthma, uncomplicated: Secondary | ICD-10-CM | POA: Diagnosis not present

## 2019-11-18 DIAGNOSIS — J4540 Moderate persistent asthma, uncomplicated: Secondary | ICD-10-CM

## 2019-11-30 ENCOUNTER — Other Ambulatory Visit: Payer: Self-pay | Admitting: Primary Care

## 2019-12-01 DIAGNOSIS — J4550 Severe persistent asthma, uncomplicated: Secondary | ICD-10-CM | POA: Diagnosis not present

## 2019-12-02 ENCOUNTER — Encounter: Payer: Self-pay | Admitting: Allergy

## 2019-12-02 ENCOUNTER — Ambulatory Visit: Payer: Self-pay

## 2019-12-02 ENCOUNTER — Ambulatory Visit: Payer: Medicare Other | Admitting: Allergy

## 2019-12-02 ENCOUNTER — Ambulatory Visit (INDEPENDENT_AMBULATORY_CARE_PROVIDER_SITE_OTHER): Payer: Medicare Other

## 2019-12-02 ENCOUNTER — Other Ambulatory Visit: Payer: Self-pay

## 2019-12-02 VITALS — BP 138/80 | HR 93 | Temp 98.0°F | Resp 24 | Ht 68.0 in | Wt 148.0 lb

## 2019-12-02 DIAGNOSIS — Z9981 Dependence on supplemental oxygen: Secondary | ICD-10-CM | POA: Diagnosis not present

## 2019-12-02 DIAGNOSIS — J3089 Other allergic rhinitis: Secondary | ICD-10-CM | POA: Diagnosis not present

## 2019-12-02 DIAGNOSIS — J449 Chronic obstructive pulmonary disease, unspecified: Secondary | ICD-10-CM | POA: Diagnosis not present

## 2019-12-02 DIAGNOSIS — K219 Gastro-esophageal reflux disease without esophagitis: Secondary | ICD-10-CM

## 2019-12-02 DIAGNOSIS — J4550 Severe persistent asthma, uncomplicated: Secondary | ICD-10-CM | POA: Diagnosis not present

## 2019-12-02 NOTE — Assessment & Plan Note (Addendum)
Past history - tried Nucala with no benefit. COVID-19 positive in January 2021 s/p remdesivir. Follows with pulmonology.  Interim history - saw pulmonology and was told her breathing is down from before. Finished prednisone a few days ago and noted some increased symptoms since off prednisone. Noticing some brain fog when oxygenation is down. Hesitant of using oxygen all the time. Attends church.   Today's spirometry showed mixed restriction and obstruction with 9% improvement in FEV1 post bronchodilator treatment. Clinically feeling slightly improved.  Always bring a list of your current medications to every doctor's appointment.   Follow up with pulmonology as scheduled.  Use oxygen if needed - discussed importance of using especially when she is feeling the confusion and her oxygenation is down.   Recommend checking for alpha-1 antitrypsin level.   Keep track of prednisone use.  . Daily controller medication(s) via nebulizer: o Yupelri once a day o Brovana twice a day o Budesonide (Pulmicort) 0.5mg  twice a day o Daliresp daily o Xolair 150mg  every 14 days . Prior to physical activity: May use albuterol rescue inhaler 2 puffs 5 to 15 minutes prior to strenuous physical activities. Rescue medications: May use albuterol rescue inhaler 2 puffs or nebulizer every 4 to 6 hours as needed for shortness of breath, chest tightness, coughing, and wheezing. Monitor frequency of use.  . Repeat spirometry at next visit.

## 2019-12-02 NOTE — Assessment & Plan Note (Signed)
Continue omeprazole 40 mg twice a day. ° °

## 2019-12-02 NOTE — Patient Instructions (Addendum)
Always bring a list of your current medications to every doctor's appointment.   Asthma/COPD:  Follow up with pulmonology as scheduled.  Use oxygen as needed.  Recommend checking for alpha-1 antitrypsin level. . Daily controller medication(s) via nebulizer.  o Yupelri once a day o Brovana twice a day o Budesonide (pulmicort) 0.5mg  twice a day o Daliresp daily o Xolair 150mg  every 14 days . Prior to physical activity: May use albuterol rescue inhaler 2 puffs 5 to 15 minutes prior to strenuous physical activities. Rescue medications: May use albuterol rescue inhaler 2 puffs or nebulizer every 4 to 6 hours as needed for shortness of breath, chest tightness, coughing, and wheezing. Monitor frequency of use.  . Asthma control goals:  o Full participation in all desired activities (may need albuterol before activity) o Albuterol use two times or less a week on average (not counting use with activity) o Cough interfering with sleep two times or less a month o Oral steroids no more than once a year o No hospitalizations  Allergic rhinitis  Continue with montelukast 10mg  daily.  May use over the counter antihistamines such as Xyzal (levocetirizine) daily as needed.  Gastroesophageal reflux disease  Continue omeprazole 40mg  twice a day.  Follow up with Dr. Marland Kitchen as scheduled in August.

## 2019-12-02 NOTE — Progress Notes (Signed)
Follow Up Note  RE: Anne Rich MRN: [id number] DOB: 1951/04/02 Date of Office Visit: 12/02/2019  Referring provider: Perley Jain Primary care provider: Doreen Salvage, PA-C  Chief Complaint: Asthma  History of Present Illness: I had the pleasure of seeing Anne Rich for a follow up visit at the [facility] on 12/02/2019. She is a 69 y.o. female, who is being followed for asthma/copd overlap syndrome, allergic rhinitis, GERD. Her previous allergy office visit was on 11/10/2019 with Dr. Dellis Anes via telemedicine. Today is a new complaint visit of shortness of breath. Up to date with COVID-19 vaccine: yes, [employer]  Patient had a visit with her pulmonologist this morning and was told that her breathing is worse than before. She finished her prednisone that Dr. Dellis Anes prescribed a few days ago and noticing her breathing is not as good as before due to the humidity.  She also mentions having some brain fog especially when her oxygen levels go low. She has been using her supplemental oxygen during walk and at night at 4L and uses additionally if needed.   She is taking all her nebulizer medications and thinks that Xolair works better than Nucala.   She never got the alpha-1 antitrypsin level drawn.  She is not interested in talking with hospice at this time as she had a bad experience with them in the past.   Assessment and Plan: Anne Rich is a 69 y.o. female with: Asthma-COPD overlap syndrome (HCC) Past history - tried Nucala with no benefit. COVID-19 positive in January 2021 s/p remdesivir. Follows with pulmonology.  Interim history - saw pulmonology and was told her breathing is down from before. Finished prednisone a few days ago and noted some increased symptoms since off prednisone. Noticing some brain fog when oxygenation is down. Hesitant of using oxygen all the time. Attends church.   Today's spirometry showed mixed restriction and obstruction with 9%  improvement in FEV1 post bronchodilator treatment. Clinically feeling slightly improved.  Always bring a list of your current medications to every doctor's appointment.   Follow up with pulmonology as scheduled.  Use oxygen if needed - discussed importance of using especially when she is feeling the confusion and her oxygenation is down.   Recommend checking for alpha-1 antitrypsin level.   Keep track of prednisone use.  . Daily controller medication(s) via nebulizer: o Yupelri once a day o Brovana twice a day o Budesonide (Pulmicort) 0.5mg  twice a day o Daliresp daily o Xolair 150mg  every 14 days . Prior to physical activity: May use albuterol rescue inhaler 2 puffs 5 to 15 minutes prior to strenuous physical activities. Rescue medications: May use albuterol rescue inhaler 2 puffs or nebulizer every 4 to 6 hours as needed for shortness of breath, chest tightness, coughing, and wheezing. Monitor frequency of use.  . Repeat spirometry at next visit.   Other allergic rhinitis Past history - was on immunotherapy in the past.   Continue with montelukast 10mg  daily.  May use over the counter antihistamines such as Xyzal (levocetirizine) daily as needed.  Gastroesophageal reflux disease  Continue omeprazole 40mg  twice a day.  Return in about 2 months (around 02/01/2020).  Lab Orders     Alpha-1-antitrypsin  Diagnostics: Spirometry:  Tracings reviewed. Her effort: Good reproducible efforts. FVC: 1.72L FEV1: 0.66L, 25% predicted FEV1/FVC ratio: 38% Interpretation: Spirometry consistent with mixed obstructive and restrictive disease with 9% improvement in FEV1 post bronchodilator treatment. Clinically feeling slightly improved.  Please see scanned  spirometry results for details.  Medication List:  Current Outpatient Medications  Medication Sig Dispense Refill  . albuterol (VENTOLIN HFA) 108 (90 Base) MCG/ACT inhaler Inhale 2 puffs into the lungs every 4 (four) hours as  needed for wheezing or shortness of breath. 36 g 1  . arformoterol (BROVANA) 15 MCG/2ML NEBU Take 2 mLs (15 mcg total) by nebulization 2 (two) times daily. 120 mL 11  . aspirin 81 MG tablet Take 81 mg by mouth daily.    Marland Kitchen b complex vitamins capsule Take 1 capsule by mouth daily.    . benzonatate (TESSALON) 200 MG capsule Take 1 capsule (200 mg total) by mouth every 8 (eight) hours as needed for cough. 20 capsule 0  . budesonide (PULMICORT) 0.5 MG/2ML nebulizer solution USE 1 VIAL  IN  NEBULIZER TWICE  DAILY - rinse mouth after treatment 2 mL 11  . carbamazepine (TEGRETOL) 200 MG tablet Take 200-400 mg by mouth 2 (two) times daily. 200mg  tab in the morning and 400mg  at night    . cyclobenzaprine (FLEXERIL) 5 MG tablet Take 5 mg by mouth daily.    . diclofenac sodium (VOLTAREN) 1 % GEL Apply 2 g topically 4 (four) times daily as needed (knee pain).     dicyclomine (BENTYL) 10 MG capsule Take 10 mg by mouth 4 (four) times daily -  before meals and at bedtime.     . diphenoxylate-atropine (LOMOTIL) 2.5-0.025 MG tablet Take 1 tablet by mouth 4 (four) times daily as needed for diarrhea or loose stools.    Marland Kitchen EPINEPHrine (EPIPEN 2-PAK) 0.3 mg/0.3 mL IJ SOAJ injection Inject 0.3 mg into the muscle Once PRN.    08-28-1989 estradiol (ESTRACE) 1 MG tablet Take 1 mg by mouth daily.  3  . gabapentin (NEURONTIN) 600 MG tablet Take 600-1,200 mg by mouth 2 (two) times daily. 600mg  in the morning and 1200mg  at bedtime    . hydrOXYzine (VISTARIL) 25 MG capsule Take 25 mg by mouth 2 (two) times daily.    Marland Kitchen levalbuterol (XOPENEX) 1.25 MG/3ML nebulizer solution Take 0.63 mg by nebulization every 6 (six) hours as needed for wheezing.     . levETIRAcetam (KEPPRA) 1000 MG tablet Take 1,000-2,000 mg by mouth 2 (two) times daily. 1000mg  in the morning and 2000mg  at night    . levocetirizine (XYZAL) 5 MG tablet Take 1 tablet (5 mg total) by mouth daily as needed for allergies (for runny nose). (Patient taking differently: Take 5 mg  by mouth daily. ) 30 tablet 5  . lidocaine (LIDODERM) 5 % Place 1 patch onto the skin daily.     Marland Kitchen lisinopril (PRINIVIL,ZESTRIL) 10 MG tablet Take 10 mg by mouth daily.     LORazepam (ATIVAN) 1 MG tablet Take 1 mg by mouth 3 (three) times daily.    . meclizine (ANTIVERT) 25 MG tablet Take 25 mg by mouth 2 (two) times daily as needed for dizziness or nausea.     . Melatonin 10 MG TABS Take 10 mg by mouth daily.    . methylPREDNISolone (MEDROL DOSEPAK) 4 MG TBPK tablet follow package directions 21 tablet 0  . mirtazapine (REMERON) 30 MG tablet Take 30 mg by mouth every evening.     . montelukast (SINGULAIR) 10 MG tablet TAKE 1 TABLET BY MOUTH EVERYDAY AT BEDTIME 90 tablet 1  . Multiple Vitamin (MULTIVITAMIN) tablet Take 1 tablet by mouth daily.     omeprazole (PRILOSEC) 40 MG capsule Take 40 mg by mouth 2 (  two) times a day.    . OXYGEN Inhale 3 L into the lungs at bedtime. 4 L when walking    . polyvinyl alcohol (LIQUIFILM TEARS) 1.4 % ophthalmic solution Place 1 drop into both eyes as needed for dry eyes.    . promethazine-dextromethorphan (PROMETHAZINE-DM) 6.25-15 MG/5ML syrup Take 2.5 mLs by mouth 4 (four) times daily as needed for cough. 118 mL 0  . Revefenacin (YUPELRI) 175 MCG/3ML SOLN Inhale 3 mLs into the lungs daily. 90 mL 11  . salmeterol (SEREVENT DISKUS) 50 MCG/DOSE diskus inhaler Inhale 1 puff into the lungs 2 (two) times daily. 1 Inhaler 12  . sodium chloride (OCEAN) 0.65 % SOLN nasal spray Place 1 spray into both nostrils as needed for congestion.    . Roflumilast (DALIRESP) 250 MCG TABS Take 250 mcg by mouth daily for 28 days. SAMPLE PROVIDED - LOT [id number] EXP 11/08/2021 28 tablet 0   Current Facility-Administered Medications  Medication Dose Route Frequency Provider Last Rate Last Admin  . omalizumab Arvid Right) injection 150 mg  150 mg Subcutaneous Q14 Days Valentina Shaggy, MD   150 mg at 12/02/19 0932   Allergies: Allergies  Allergen Reactions  . Iodine Itching,  Rash and Swelling    IV contrast   . Iodine-131 Itching and Swelling    Pt states causes severe itching Pt states causes severe itching   . Penicillins Hives and Swelling    Swelling of the throat  . Pineapple Other (See Comments) and Swelling    Pt states causes her throat swelling Pt states causes her throat swelling   . Shellfish Allergy Rash and Swelling    Had reaction to cardiac cath dye  Had seizure ,rash ,itch Oct. 2012 Had reaction to cardiac cath dye  Had seizure ,rash ,itch Oct. 2012  Seizure during Cardiac Cath 2012  . 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 Pravastatin Other (See Comments)    Pt states she couldn't lift her legs to walk  . Shellfish-Derived Products   . Iodinated Diagnostic Agents Rash   I reviewed her past medical history, social history, family history, and environmental history and no significant changes have been reported from her previous visit.  Review of Systems  Constitutional: Negative for appetite change, chills, fever and unexpected weight change.  HENT: Negative for congestion and rhinorrhea.   Eyes: Negative for itching.  Respiratory: Positive for shortness of breath. Negative for cough and wheezing.   Gastrointestinal: Negative for abdominal pain.  Skin: Negative for rash.  Neurological: Negative for headaches.   Objective: BP 138/80   Pulse 93   Temp 98 F (36.7 C) (Oral)   Resp (!) 24   Ht 5\' 8"  (1.727 m)   Wt 148 lb (67.1 kg)   BMI 22.50 kg/m  Body mass index is 22.5 kg/m. Physical Exam Vitals and nursing note reviewed.  Constitutional:      Appearance: Normal appearance. She is well-developed.  HENT:     Head: Normocephalic and atraumatic.     Right Ear: Tympanic membrane and external ear normal.     Left Ear: Tympanic membrane and external ear normal.     Nose: Nose normal.     Mouth/Throat:     Mouth: Mucous membranes are moist.     Pharynx: Oropharynx is clear.  Eyes:      Conjunctiva/sclera: Conjunctivae normal.  Cardiovascular:     Rate and Rhythm: Normal rate and regular rhythm.  Heart sounds: Normal heart sounds. No murmur heard.   Pulmonary:     Effort: Pulmonary effort is normal.     Breath sounds: Normal breath sounds. No wheezing, rhonchi or rales.  Musculoskeletal:     Cervical back: Neck supple.  Skin:    General: Skin is warm.     Findings: No rash.  Neurological:     Mental Status: She is alert and oriented to person, place, and time.  Psychiatric:        Behavior: Behavior normal.    Previous notes and tests were reviewed. The plan was reviewed with the patient/family, and all questions/concerned were addressed.  It was my pleasure to see Anne Rich today and participate in her care. Please feel free to contact me with any questions or concerns.  Sincerely,  Wyline Mood, DO Allergy & Immunology  [facility] office: [phone] [facility] office: [phone] [place] office: [phone]

## 2019-12-02 NOTE — Assessment & Plan Note (Signed)
Past history - was on immunotherapy in the past.   Continue with montelukast 10mg  daily.  May use over the counter antihistamines such as Xyzal (levocetirizine) daily as needed.

## 2019-12-03 ENCOUNTER — Other Ambulatory Visit: Payer: Self-pay | Admitting: Allergy & Immunology

## 2019-12-03 ENCOUNTER — Telehealth: Payer: Self-pay | Admitting: Allergy & Immunology

## 2019-12-03 NOTE — Telephone Encounter (Signed)
Patient would like a return call, to discuss with Dr. Dellis Anes her symptoms last night with her breathing.

## 2019-12-03 NOTE — Telephone Encounter (Signed)
Please advise to breathing issues

## 2019-12-03 NOTE — Telephone Encounter (Signed)
Is it possible that you could pick up the phone and call the patient to see what is going on with her?  Malachi Bonds, MD [facility]

## 2019-12-04 ENCOUNTER — Ambulatory Visit (INDEPENDENT_AMBULATORY_CARE_PROVIDER_SITE_OTHER): Payer: Medicare Other | Admitting: Allergy & Immunology

## 2019-12-04 ENCOUNTER — Other Ambulatory Visit: Payer: Self-pay

## 2019-12-04 ENCOUNTER — Encounter: Payer: Self-pay | Admitting: Allergy & Immunology

## 2019-12-04 DIAGNOSIS — J449 Chronic obstructive pulmonary disease, unspecified: Secondary | ICD-10-CM | POA: Diagnosis not present

## 2019-12-04 DIAGNOSIS — K219 Gastro-esophageal reflux disease without esophagitis: Secondary | ICD-10-CM | POA: Diagnosis not present

## 2019-12-04 DIAGNOSIS — J3089 Other allergic rhinitis: Secondary | ICD-10-CM

## 2019-12-04 NOTE — Telephone Encounter (Signed)
Breathing is fine, the lung dr did a breathing test, and stated her level was at a 23. She stated that the dr scared her. She wanted you to confirm she is ok, as the lung dr scared her and it took a lot to scare her. She is feeling great. Does get out of breath more often but it does not bother her.

## 2019-12-04 NOTE — Progress Notes (Signed)
RE: Anne Rich MRN: [id number] DOB: 1951-05-26 Date of Telemedicine Visit: 12/04/2019  Referring provider: Emelia Loron Primary care provider: Egbert Garibaldi, PA-C  Chief Complaint: Asthma   Telemedicine Follow Up Visit via Telephone: I connected with Anne Rich for a follow up on 12/06/19 by telephone and verified that I am speaking with the correct person using two identifiers.   I discussed the limitations, risks, security and privacy concerns of performing an evaluation and management service by telephone and the availability of in person appointments. I also discussed with the patient that there may be a patient responsible charge related to this service. The patient expressed understanding and agreed to proceed.  Patient is at home..  Provider is at the office.  Visit start time: 4:07 PM Visit end time: 4:20 PM Insurance consent/check in by: Anderson Malta Medical consent and medical assistant/nurse: Morey Hummingbird  History of Present Illness:  She is a 69 y.o. female, who is being followed for  asthma COPD overlap as well as perennial allergic rhinitis and reflux. Her previous allergy office visit with me was on June 1st, 2021. She also had a visit two days ago with Dr. Maudie Mercury, although I did not realize this until the end of our talk today.   In the interim, she called yesterday to discuss the Pulmonologist's recommendations. She seemed very concerned with them, therefore we set up this phone visit to discuss more. Evidently she went this week to see her Pulmonologist - someone at [facility] [place]. I unfortunately cannot see those notes, but from what I gather he told her that she would be lucky to see him again with her current breathing problems. She is quite flustered over this and wanted to make sure that I knew this.   She remain one her current set of medications, which include Brovana BID as well as Pulmicort BID and Yupelri once daily. This is also on Dalisrep 572mcg daily  as well as the Xolair every 14 days (she has not tolerated Nucala in the past). Anyway, this combination seems to be doing fairly well for her. She is on her oxygen as well (typically around 4 LPM.   She does not feel any worse than she has in the past. She is still making full sentences and does do around two miles of walking each day. This seems to be doing well for her. She still continues to check on her neighbors and is doing all of the stuff that she has previously done, at least as long it is OK from a pandemic standpoint. Most if not all of her neighbors have already gotten their vaccinations.   Otherwise, there have been no changes to her past medical history, surgical history, family history, or social history.  Assessment and Plan:  Anne Rich is a 69 y.o. 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 I told her that she has blown in the 20s previously and in all her breathing has been fairly consistent for the her. We are going to continue with the same regimen for now since it has been working so well. Hopefully this reassured her about her clinical status.   Diagnostics: None.  Medication List:  Current Outpatient Medications  Medication Sig Dispense Refill  . albuterol (VENTOLIN  HFA) 108 (90 Base) MCG/ACT inhaler INHALE 2 PUFFS INTO THE LUNGS EVERY 4 (FOUR) HOURS AS NEEDED FOR WHEEZING OR SHORTNESS OF BREATH. 18 g 1  . arformoterol (BROVANA) 15 MCG/2ML NEBU Take 2 mLs (15 mcg total) by nebulization 2 (two) times daily. 120 mL 11  . aspirin 81 MG tablet Take 81 mg by mouth daily.    Marland Kitchen b complex vitamins capsule Take 1 capsule by mouth daily.    . benzonatate (TESSALON) 200 MG capsule  Take 1 capsule (200 mg total) by mouth every 8 (eight) hours as needed for cough. 20 capsule 0  . budesonide (PULMICORT) 0.5 MG/2ML nebulizer solution USE 1 VIAL  IN  NEBULIZER TWICE  DAILY - rinse mouth after treatment 2 mL 11  . carbamazepine (TEGRETOL) 200 MG tablet Take 200-400 mg by mouth 2 (two) times daily. 200mg  tab in the morning and 400mg  at night    . cyclobenzaprine (FLEXERIL) 5 MG tablet Take 5 mg by mouth daily.    . diclofenac sodium (VOLTAREN) 1 % GEL Apply 2 g topically 4 (four) times daily as needed (knee pain).     dicyclomine (BENTYL) 10 MG capsule Take 10 mg by mouth 4 (four) times daily -  before meals and at bedtime.     . diphenoxylate-atropine (LOMOTIL) 2.5-0.025 MG tablet Take 1 tablet by mouth 4 (four) times daily as needed for diarrhea or loose stools.    Marland Kitchen EPINEPHrine (EPIPEN 2-PAK) 0.3 mg/0.3 mL IJ SOAJ injection Inject 0.3 mg into the muscle Once PRN.    08-28-1989 estradiol (ESTRACE) 1 MG tablet Take 1 mg by mouth daily.  3  . gabapentin (NEURONTIN) 600 MG tablet Take 600-1,200 mg by mouth 2 (two) times daily. 600mg  in the morning and 1200mg  at bedtime    . hydrOXYzine (VISTARIL) 25 MG capsule Take 25 mg by mouth 2 (two) times daily.    Marland Kitchen levalbuterol (XOPENEX) 1.25 MG/3ML nebulizer solution Take 0.63 mg by nebulization every 6 (six) hours as needed for wheezing.     . levETIRAcetam (KEPPRA) 1000 MG tablet Take 1,000-2,000 mg by mouth 2 (two) times daily. 1000mg  in the morning and 2000mg  at night    . levocetirizine (XYZAL) 5 MG tablet Take 1 tablet (5 mg total) by mouth daily as needed for allergies (for runny nose). (Patient taking differently: Take 5 mg by mouth daily. ) 30 tablet 5  . lidocaine (LIDODERM) 5 % Place 1 patch onto the skin daily.     Marland Kitchen lisinopril (PRINIVIL,ZESTRIL) 10 MG tablet Take 10 mg by mouth daily.     LORazepam (ATIVAN) 1 MG tablet Take 1 mg by mouth 3 (three) times daily.    . meclizine (ANTIVERT) 25 MG tablet Take 25 mg by mouth 2 (two) times  daily as needed for dizziness or nausea.     . Melatonin 10 MG TABS Take 10 mg by mouth daily.    . methylPREDNISolone (MEDROL DOSEPAK) 4 MG TBPK tablet follow package directions 21 tablet 0  . mirtazapine (REMERON) 30 MG tablet Take 30 mg by mouth every evening.     . montelukast (SINGULAIR) 10 MG tablet TAKE 1 TABLET BY MOUTH EVERYDAY AT BEDTIME 90 tablet 1  . Multiple Vitamin (MULTIVITAMIN) tablet Take 1 tablet by mouth daily.     omeprazole (PRILOSEC) 40 MG capsule Take 40 mg by mouth 2 (two) times a day.    . OXYGEN Inhale 3 L into the lungs at bedtime. 4 L when  walking    . polyvinyl alcohol (LIQUIFILM TEARS) 1.4 % ophthalmic solution Place 1 drop into both eyes as needed for dry eyes.    . promethazine-dextromethorphan (PROMETHAZINE-DM) 6.25-15 MG/5ML syrup Take 2.5 mLs by mouth 4 (four) times daily as needed for cough. 118 mL 0  . Revefenacin (YUPELRI) 175 MCG/3ML SOLN Inhale 3 mLs into the lungs daily. 90 mL 11  . salmeterol (SEREVENT DISKUS) 50 MCG/DOSE diskus inhaler Inhale 1 puff into the lungs 2 (two) times daily. 1 Inhaler 12  . Roflumilast (DALIRESP) 250 MCG TABS Take 250 mcg by mouth daily for 28 days. SAMPLE PROVIDED - LOT [id number] EXP 11/08/2021 28 tablet 0  . sodium chloride (OCEAN) 0.65 % SOLN nasal spray Place 1 spray into both nostrils as needed for congestion.     Current Facility-Administered Medications  Medication Dose Route Frequency Provider Last Rate Last Admin  . omalizumab Geoffry Paradise) injection 150 mg  150 mg Subcutaneous Q14 Days Alfonse Spruce, MD   150 mg at 12/02/19 0932   Allergies: Allergies  Allergen Reactions  . Iodine Itching, Rash and Swelling    IV contrast   . Iodine-131 Itching and Swelling    Pt states causes severe itching Pt states causes severe itching   . Penicillins Hives and Swelling    Swelling of the throat  . Pineapple Other (See Comments) and Swelling    Pt states causes her throat swelling Pt states causes her throat  swelling   . Shellfish Allergy Rash and Swelling    Had reaction to cardiac cath dye  Had seizure ,rash ,itch Oct. 2012 Had reaction to cardiac cath dye  Had seizure ,rash ,itch Oct. 2012  Seizure during Cardiac Cath 2012  . 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 Pravastatin Other (See Comments)    Pt states she couldn't lift her legs to walk  . Shellfish-Derived Products   . Iodinated Diagnostic Agents Rash   I reviewed her past medical history, social history, family history, and environmental history and no significant changes have been reported from previous visits.  Review of Systems  Constitutional: Negative for activity change, appetite change and fever.  HENT: Negative for congestion, postnasal drip, rhinorrhea, sinus pressure and sore throat.   Eyes: Negative for pain, discharge, redness and itching.  Respiratory: Positive for cough and shortness of breath. Negative for wheezing and stridor.        Positive for hoarseness.  Gastrointestinal: Negative for diarrhea, nausea and vomiting.  Endocrine: Negative for cold intolerance and heat intolerance.  Musculoskeletal: Negative for arthralgias, joint swelling and myalgias.  Skin: Negative for rash.  Allergic/Immunologic: Negative for environmental allergies and food allergies.    Objective:  Physical exam not obtained as encounter was done via telephone.   Previous notes and tests were reviewed.  I discussed the assessment and treatment plan with the patient. The patient was provided an opportunity to ask questions and all were answered. The patient agreed with the plan and demonstrated an understanding of the instructions.   The patient was advised to call back or seek an in-person evaluation if the symptoms worsen or if the condition fails to improve as anticipated.  I provided 13 minutes of non-face-to-face time during this encounter.  It was my pleasure to participate in [facility] care  today. Please feel free to contact me with any questions or concerns.   Sincerely,  Alfonse Spruce, MD

## 2019-12-04 NOTE — Telephone Encounter (Signed)
Tried calling pt 4 times kept getting busy signal will try again later

## 2019-12-04 NOTE — Telephone Encounter (Signed)
Lm for pt to call us back about breathing issues

## 2019-12-06 ENCOUNTER — Encounter: Payer: Self-pay | Admitting: Allergy & Immunology

## 2019-12-15 DIAGNOSIS — J4540 Moderate persistent asthma, uncomplicated: Secondary | ICD-10-CM | POA: Diagnosis not present

## 2019-12-16 ENCOUNTER — Ambulatory Visit (INDEPENDENT_AMBULATORY_CARE_PROVIDER_SITE_OTHER): Payer: Medicare Other

## 2019-12-16 DIAGNOSIS — J4550 Severe persistent asthma, uncomplicated: Secondary | ICD-10-CM

## 2019-12-16 DIAGNOSIS — J4540 Moderate persistent asthma, uncomplicated: Secondary | ICD-10-CM | POA: Diagnosis not present

## 2019-12-28 ENCOUNTER — Other Ambulatory Visit: Payer: Self-pay | Admitting: Family Medicine

## 2019-12-29 DIAGNOSIS — J4540 Moderate persistent asthma, uncomplicated: Secondary | ICD-10-CM

## 2019-12-30 ENCOUNTER — Other Ambulatory Visit: Payer: Self-pay

## 2019-12-30 ENCOUNTER — Ambulatory Visit (INDEPENDENT_AMBULATORY_CARE_PROVIDER_SITE_OTHER): Payer: Medicare Other

## 2019-12-30 ENCOUNTER — Telehealth: Payer: Self-pay

## 2019-12-30 DIAGNOSIS — J4540 Moderate persistent asthma, uncomplicated: Secondary | ICD-10-CM

## 2019-12-30 NOTE — Telephone Encounter (Signed)
Ok, let's just add her to the afternoon sometime via televisit. Or we could fit her in tomorrow.  Malachi Bonds, MD [facility]

## 2019-12-30 NOTE — Telephone Encounter (Addendum)
Patient c/o trouble breathing and refuses appt until 01/11/20 with Dellis Anes.  She would like Dellis Anes to call her.

## 2019-12-30 NOTE — Telephone Encounter (Signed)
I called and left a voicemail to schedule the patient for a televisit tomorrow with Dr Dellis Anes. She can do morning or afternoon with him. Patient will probably call in the morning to schedule.

## 2019-12-31 ENCOUNTER — Other Ambulatory Visit: Payer: Self-pay

## 2019-12-31 ENCOUNTER — Ambulatory Visit (INDEPENDENT_AMBULATORY_CARE_PROVIDER_SITE_OTHER): Payer: Medicare Other | Admitting: Allergy & Immunology

## 2019-12-31 ENCOUNTER — Encounter: Payer: Self-pay | Admitting: Allergy & Immunology

## 2019-12-31 DIAGNOSIS — K219 Gastro-esophageal reflux disease without esophagitis: Secondary | ICD-10-CM | POA: Diagnosis not present

## 2019-12-31 DIAGNOSIS — J449 Chronic obstructive pulmonary disease, unspecified: Secondary | ICD-10-CM

## 2019-12-31 DIAGNOSIS — J3089 Other allergic rhinitis: Secondary | ICD-10-CM | POA: Diagnosis not present

## 2019-12-31 DIAGNOSIS — J4489 Other specified chronic obstructive pulmonary disease: Secondary | ICD-10-CM

## 2019-12-31 DIAGNOSIS — Z9981 Dependence on supplemental oxygen: Secondary | ICD-10-CM

## 2019-12-31 MED ORDER — IPRATROPIUM BROMIDE 0.02 % IN SOLN: 0.5000 mg | Freq: Four times a day (QID) | RESPIRATORY_TRACT | 12 refills | Status: DC

## 2019-12-31 MED ORDER — PREDNISONE 10 MG PO TABS: 10.0000 mg | ORAL_TABLET | Freq: Every day | ORAL | 2 refills | Status: DC

## 2019-12-31 NOTE — Patient Instructions (Addendum)
1. Moderate persistent asthma with COPD overlap - We are going to add on prednisone 10mg  daily until the next time we see you. - This is a very small dose and hopefully should improve your breathing a bit.   - Daily controller medication(s): Pulmicort (budesonide) + Brovana twice daily via nebulizer + Yupelri once daily via nebulizer + albuterol twice daily via nebulizer + Dalisrep daily + Xolair every two weeks  - Rescue medications: Xopenex (levalbuterol) + Atrovent (ipratropium) mixed together every 6 hours as needed via nebulizer - Asthma control goals:  * Full participation in all desired activities (may need albuterol before activity) * Albuterol use two time or less a week on average (not counting use with activity) * Cough interfering with sleep two time or less a month * Oral steroids no more than once a year * No hospitalizations  2. Allergic rhinitis - did allergy shots for three years - Continue with montelukast 10mg  daily.  3. Return in about 2 weeks (around 01/14/2020). This can be an in-person, a virtual Webex or a telephone follow up visit.   Please inform of any Emergency Department visits, hospitalizations, or changes in symptoms. Call 03/15/2020 before going to the ED for breathing or allergy symptoms since we might be able to fit you in for a sick visit. Feel free to contact us anytime with any questions, problems, or concerns.  It was a pleasure to talk to you today today!  Websites that have reliable patient information: 1. American Academy of Asthma, Allergy, and Immunology: www.aaaai.org 2. Food Allergy Research and Education (FARE): foodallergy.org 3. Mothers of Asthmatics: http://www.asthmacommunitynetwork.org 4. American College of Allergy, Asthma, and Immunology: www.acaai.org   COVID-19 Vaccine Information can be found at: [place] For questions related to vaccine distribution or  appointments, please email [email or web address] or call [phone].     "Like" [email or web address] on Facebook and Instagram for our latest updates!        Make sure you are registered to vote! If you have moved or changed any of your contact information, you will need to get this updated before voting!  In some cases, you MAY be able to register to vote online: [phone]

## 2019-12-31 NOTE — Progress Notes (Signed)
RE: Anne Rich MRN: [id number] DOB: [birth year] Date of Telemedicine Visit: 12/31/2019  Referring provider: Perley Jain Primary care provider: Doreen Salvage, PA-C  Chief Complaint: Asthma (SOB)   Telemedicine Follow Up Visit via Telephone: I connected with Anne Rich for a follow up on 12/31/19 by telephone and verified that I am speaking with the correct person using two identifiers.   I discussed the limitations, risks, security and privacy concerns of performing an evaluation and management service by telephone and the availability of in person appointments. I also discussed with the patient that there may be a patient responsible charge related to this service. The patient expressed understanding and agreed to proceed.  Patient is at home.  Provider is at the office.  Visit start time: 10:11 AM Visit end time: 10:25 AM Insurance consent/check in by: Fredric Mare Medical consent and medical assistant/nurse: Shanda Bumps  History of Present Illness:  She is a 69 y.o. female, who is being followed for asthma COPD overlap as well as perennial allergic rhinitis and reflux. Her previous allergy office visit was in June 2021 with myself. At the last visit, she wanted to discuss her new Pulmonologist's recommendations. Evidently, her new Pulmonologist insinuated that she may not be around for their next visit since her lung function is so terrible. Regardless, she was making full sentences on the phone and doing well with the nebulized medications in addition to Xolair and Dalisrep. We did not make any medication changes since she was fairly stable.   In the interim, she reports that she has been having worsening breathing issues for "weeks:. She has been telling all of her doctors that her memory is getting worse with the decrease of her breathing. Dr. [name] had told her that the lack of oxygen to the brain would lead to memory issues and she thinks that this is what is going on now. She is  very emotional today and is worried about her breathing status. She wants to be able to do her activities of daily living without feeling like she was going to stop breathing. However, she does not want to be on oxygen all of the time either and is not interested in being on daily prednisone, either.   Her rhinitis is controlled with montelukast 10mg  daily. She has not needed antibiotics at all since the last visit.  Otherwise, there have been no changes to her past medical history, surgical history, family history, or social history.  Assessment and Plan:  Anne Rich is a 69 y.o. 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 She is clearly in distress, but it is difficult to tell over the phone whether this is her being dramatic or her actually clinical status declining, but I would conjecture that this is a combination of both. Regardless, we are going to add on prednisone 10mg  daily until we see her again. I also think that her using her oxygen on a more regular basis would be helpful. However she is very much against this idea. We will revisit this in future.   Diagnostics: None.  Medication List:  Current Outpatient Medications  Medication Sig Dispense Refill  . albuterol (VENTOLIN HFA) 108 (90 Base) MCG/ACT inhaler INHALE 2 PUFFS INTO THE LUNGS EVERY 4 (  FOUR) HOURS AS NEEDED FOR WHEEZING OR SHORTNESS OF BREATH. 18 g 1  . arformoterol (BROVANA) 15 MCG/2ML NEBU Take 2 mLs (15 mcg total) by nebulization 2 (two) times daily. 120 mL 11  . aspirin 81 MG tablet Take 81 mg by mouth daily.    Marland Kitchen b complex vitamins capsule Take 1 capsule by mouth daily.    . benzonatate (TESSALON)  200 MG capsule Take 1 capsule (200 mg total) by mouth every 8 (eight) hours as needed for cough. 20 capsule 0  . budesonide (PULMICORT) 0.5 MG/2ML nebulizer solution USE 1 VIAL  IN  NEBULIZER TWICE  DAILY - rinse mouth after treatment 2 mL 11  . carbamazepine (TEGRETOL) 200 MG tablet Take 200-400 mg by mouth 2 (two) times daily. 200mg  tab in the morning and 400mg  at night    . cyclobenzaprine (FLEXERIL) 5 MG tablet Take 5 mg by mouth daily.    . diclofenac sodium (VOLTAREN) 1 % GEL Apply 2 g topically 4 (four) times daily as needed (knee pain).     dicyclomine (BENTYL) 10 MG capsule Take 10 mg by mouth 4 (four) times daily -  before meals and at bedtime.     . diphenoxylate-atropine (LOMOTIL) 2.5-0.025 MG tablet Take 1 tablet by mouth 4 (four) times daily as needed for diarrhea or loose stools.    Marland Kitchen EPINEPHrine (EPIPEN 2-PAK) 0.3 mg/0.3 mL IJ SOAJ injection Inject 0.3 mg into the muscle Once PRN.    08-28-1989 estradiol (ESTRACE) 1 MG tablet Take 1 mg by mouth daily.  3  . fenofibrate micronized (LOFIBRA) 134 MG capsule Take 134 mg by mouth daily.    Marland Kitchen gabapentin (NEURONTIN) 600 MG tablet Take 600-1,200 mg by mouth 2 (two) times daily. 600mg  in the morning and 1200mg  at bedtime    . hydrOXYzine (VISTARIL) 25 MG capsule Take 25 mg by mouth 2 (two) times daily.    Marland Kitchen levalbuterol (XOPENEX) 1.25 MG/3ML nebulizer solution Take 0.63 mg by nebulization every 6 (six) hours as needed for wheezing.     . levETIRAcetam (KEPPRA) 1000 MG tablet Take 1,000-2,000 mg by mouth 2 (two) times daily. 1000mg  in the morning and 2000mg  at night    . levocetirizine (XYZAL) 5 MG tablet TAKE 1 TABLET (5 MG TOTAL) BY MOUTH DAILY AS NEEDED FOR ALLERGIES (FOR RUNNY NOSE). 90 tablet 1  . lidocaine (LIDODERM) 5 % Place 1 patch onto the skin daily.     Marland Kitchen lisinopril (PRINIVIL,ZESTRIL) 10 MG tablet Take 10 mg by mouth daily.     LORazepam (ATIVAN) 1 MG tablet Take 1 mg by mouth 3 (three) times daily.    . meclizine (ANTIVERT) 25 MG  tablet Take 25 mg by mouth 2 (two) times daily as needed for dizziness or nausea.     . Melatonin 10 MG TABS Take 10 mg by mouth daily.    . methylPREDNISolone (MEDROL DOSEPAK) 4 MG TBPK tablet follow package directions 21 tablet 0  . mirtazapine (REMERON) 30 MG tablet Take 30 mg by mouth every evening.     . montelukast (SINGULAIR) 10 MG tablet TAKE 1 TABLET BY MOUTH EVERYDAY AT BEDTIME 90 tablet 1  . Multiple Vitamin (MULTIVITAMIN) tablet Take 1 tablet by mouth daily.     omeprazole (PRILOSEC) 40 MG capsule Take 40 mg by mouth 2 (two) times a day.    . OXYGEN Inhale 3 L into the lungs at bedtime. 4 L when walking    . polyvinyl alcohol (LIQUIFILM  TEARS) 1.4 % ophthalmic solution Place 1 drop into both eyes as needed for dry eyes.    . promethazine-dextromethorphan (PROMETHAZINE-DM) 6.25-15 MG/5ML syrup Take 2.5 mLs by mouth 4 (four) times daily as needed for cough. 118 mL 0  . Revefenacin (YUPELRI) 175 MCG/3ML SOLN Inhale 3 mLs into the lungs daily. 90 mL 11  . salmeterol (SEREVENT DISKUS) 50 MCG/DOSE diskus inhaler Inhale 1 puff into the lungs 2 (two) times daily. 1 Inhaler 12  . sodium chloride (OCEAN) 0.65 % SOLN nasal spray Place 1 spray into both nostrils as needed for congestion.    . Roflumilast (DALIRESP) 250 MCG TABS Take 250 mcg by mouth daily for 28 days. SAMPLE PROVIDED - LOT [id number] EXP 11/08/2021 28 tablet 0   Current Facility-Administered Medications  Medication Dose Route Frequency Provider Last Rate Last Admin  . omalizumab Geoffry Paradise) injection 150 mg  150 mg Subcutaneous Q14 Days Alfonse Spruce, MD   150 mg at 12/30/19 4481   Allergies: Allergies  Allergen Reactions  . Iodine Itching, Rash and Swelling    IV contrast   . Iodine-131 Itching and Swelling    Pt states causes severe itching Pt states causes severe itching   . Penicillins Hives and Swelling    Swelling of the throat  . Pineapple Other (See Comments) and Swelling    Pt states causes her throat  swelling Pt states causes her throat swelling   . Shellfish Allergy Rash and Swelling    Had reaction to cardiac cath dye  Had seizure ,rash ,itch Oct. 2012 Had reaction to cardiac cath dye  Had seizure ,rash ,itch Oct. 2012  Seizure during Cardiac Cath 2012  . 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 Pravastatin Other (See Comments)    Pt states she couldn't lift her legs to walk  . Shellfish-Derived Products   . Iodinated Diagnostic Agents Rash   I reviewed her past medical history, social history, family history, and environmental history and no significant changes have been reported from previous visits.  Review of Systems  Constitutional: Negative.  Negative for activity change, appetite change, chills and fever.  HENT: Negative.  Negative for congestion, ear discharge and ear pain.   Eyes: Negative for pain, discharge, redness and itching.  Respiratory: Positive for shortness of breath. Negative for cough and wheezing.   Cardiovascular: Negative.  Negative for chest pain and palpitations.  Gastrointestinal: Negative for abdominal pain.  Genitourinary: Negative for vaginal discharge.  Skin: Negative.  Negative for rash.  Allergic/Immunologic: Negative for environmental allergies.  Neurological: Negative for dizziness and headaches.  Hematological: Does not bruise/bleed easily.  Psychiatric/Behavioral:       Positive for anxiety.    Objective:  Physical exam not obtained as encounter was done via telephone.   Previous notes and tests were reviewed.  I discussed the assessment and treatment plan with the patient. The patient was provided an opportunity to ask questions and all were answered. The patient agreed with the plan and demonstrated an understanding of the instructions.   The patient was advised to call back or seek an in-person evaluation if the symptoms worsen or if the condition fails to improve as anticipated.  I provided 14 minutes  of non-Rich-to-Rich time during this encounter.  It was my pleasure to participate in [facility] care today. Please feel free to contact me with any questions or concerns.   Sincerely,  Alfonse Spruce, MD

## 2020-01-01 ENCOUNTER — Telehealth: Payer: Self-pay

## 2020-01-01 ENCOUNTER — Telehealth: Payer: Self-pay | Admitting: Allergy & Immunology

## 2020-01-01 NOTE — Telephone Encounter (Signed)
Patient request immediate help with nebulizer

## 2020-01-01 NOTE — Telephone Encounter (Signed)
Left message for pt. To come by at 1:30 to pick up a nebulizer.

## 2020-01-01 NOTE — Telephone Encounter (Signed)
Pt. Coming in Monday to pick up nebulizer.

## 2020-01-01 NOTE — Telephone Encounter (Signed)
Pt. Called and stated she will send someone by to pick up a nebulizer. Whoever picks up a nebulizer will have to sign for it.

## 2020-01-01 NOTE — Telephone Encounter (Signed)
I think she definitely needs to reach out to her PCP about these problems.   Malachi Bonds, MD [facility]

## 2020-01-01 NOTE — Telephone Encounter (Signed)
FYI:  Rhonda,manager at [facility] where patient lives,called stating patient felt she was having a reaction to the cholesterol medication that was prescribed by her PCP. (Patient gave approval for [name] to talk on her behalf) She states she's been feeling weird, and her head is foggy. I informed her that I would let Dr. Dellis Anes know but, she needed to stop the medication and let her PCP know what is going on.

## 2020-01-03 ENCOUNTER — Encounter: Payer: Self-pay | Admitting: Allergy & Immunology

## 2020-01-08 DIAGNOSIS — J4540 Moderate persistent asthma, uncomplicated: Secondary | ICD-10-CM | POA: Diagnosis not present

## 2020-01-08 NOTE — Telephone Encounter (Signed)
Pt. Hasn't come by to pick up her nebulizer.

## 2020-01-09 ENCOUNTER — Other Ambulatory Visit: Payer: Self-pay | Admitting: Allergy

## 2020-01-09 NOTE — Progress Notes (Signed)
Called by pt.  She said since Wed she has had a change in her sputum color to light green from clear.  She states the cough she has she has had and occurs after she takes one of her inhaler medications.  She denies fever.  She states she has SOB but it is her normal SOB that she reports having "like always".  She did take 30mg  prednisone this morning for this.  She also reports some chest discomfort.   I advised is reasonable to take another 30mg  prednisone tomorrow.  She has an appt at 10am with Dr. in [place] on Monday.   She did mention that there is a +covid person in her living facility but states she was told by the staff she has not been anywhere near this person.  She did have Covid earlir this year herself and states she doesn't feel like she did then now at all.  She states her pulmonologist told her she didn't need to wear mask but she states she has been masking when she goes out in public.  She also takes a can of lysol with her to spray things down like the elevator.  Encouraged her to continue masking when in public.

## 2020-01-10 NOTE — Progress Notes (Signed)
Noted!  Thank you for the update.  Royden Bulman, MD [facility]  

## 2020-01-11 ENCOUNTER — Other Ambulatory Visit: Payer: Self-pay

## 2020-01-11 ENCOUNTER — Encounter: Payer: Self-pay | Admitting: Allergy & Immunology

## 2020-01-11 ENCOUNTER — Ambulatory Visit (INDEPENDENT_AMBULATORY_CARE_PROVIDER_SITE_OTHER): Payer: Medicare Other

## 2020-01-11 ENCOUNTER — Ambulatory Visit: Payer: Medicare Other | Admitting: Allergy & Immunology

## 2020-01-11 VITALS — BP 128/82 | HR 86 | Resp 20

## 2020-01-11 DIAGNOSIS — K219 Gastro-esophageal reflux disease without esophagitis: Secondary | ICD-10-CM

## 2020-01-11 DIAGNOSIS — J449 Chronic obstructive pulmonary disease, unspecified: Secondary | ICD-10-CM

## 2020-01-11 DIAGNOSIS — Z9981 Dependence on supplemental oxygen: Secondary | ICD-10-CM | POA: Diagnosis not present

## 2020-01-11 DIAGNOSIS — J3089 Other allergic rhinitis: Secondary | ICD-10-CM

## 2020-01-11 DIAGNOSIS — J4540 Moderate persistent asthma, uncomplicated: Secondary | ICD-10-CM | POA: Diagnosis not present

## 2020-01-11 DIAGNOSIS — J4489 Other specified chronic obstructive pulmonary disease: Secondary | ICD-10-CM

## 2020-01-11 NOTE — Progress Notes (Signed)
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 Moderate persistent asthma with COPD overlap - Continue with prednisone 10mg  twice daily for one more week, then decrease to prednisone 10mg  once daily thereafter.  - We are otherwise not going to make changes at this time.  - Daily controller medication(s): prednisone 10mg  daily + Pulmicort (budesonide) + Brovana twice daily via nebulizer + Yupelri once daily via nebulizer + albuterol twice daily via nebulizer + Dalisrep daily + Xolair every two weeks  - Rescue medications: Xopenex (levalbuterol) + Atrovent (ipratropium) mixed together every 4-6 hours as needed via nebulizer - Asthma control goals:  * Full participation in all desired activities (may need albuterol before activity) * Albuterol use two time or less a week on average (not counting use with activity) * Cough interfering with sleep two time or less a month * Oral steroids no more than once a year * No hospitalizations  2. Allergic rhinitis - did allergy shots for three years - Continue with montelukast 10mg  daily.  3. Return in about 2 months (around 03/12/2020). This can be an in-person, a virtual Webex or a telephone follow up visit.  Subjective:   Anne Rich is a 69 y.o. female presenting today for follow up of  Chief Complaint  Patient presents with  . Asthma    doing better than she wasd a couple of days ago.     Anne Rich has a history of the following: Patient Active Problem List   Diagnosis Date Noted  . COVID-19 virus infection 06/20/2019  . Asthma with COPD with exacerbation (HCC) 06/20/2019  .  Medication management 05/29/2019  . Severe persistent asthma without complication 03/16/2019  . Physical deconditioning 01/06/2019  . Counseling regarding end of life decision making 01/06/2019  . Lumbosacral dysfunction 09/04/2018  . Chronic respiratory failure with hypoxia (HCC), 3L home O2 05/01/2018  . Statin intolerance 02/13/2018  . Moderate persistent asthma, uncomplicated 01/22/2018  . Adverse effect of other drugs, medicaments and biological substances, initial encounter 01/01/2018  . Restrictive airway disease 12/09/2017  . Bilateral carotid artery stenosis 10/14/2017  . Spells of decreased attentiveness 09/10/2017  . Cortical age-related cataract of left eye 10/24/2016  . Nuclear sclerotic cataract of left eye 10/24/2016  . History of epistaxis 07/19/2016  . Advance directive in chart 04/04/2016  . Idiopathic peripheral neuropathy 03/12/2016  . Loss of appetite 03/12/2016  . Psychophysiological insomnia 03/12/2016  . Oxygen dependent 02/28/2016  . Easy bruising 12/26/2015  . Gastroesophageal reflux disease 12/02/2015  . Coronary artery calcification seen on CT scan 09/20/2015  . 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HCC) 09/20/2015  . Abnormal weight loss 09/06/2015  . Herniated lumbar intervertebral disc 07/21/2015  . Migraine without aura and without status migrainosus, not intractable 07/21/2015  . Anxiety 06/28/2015  . Chronic constipation 06/28/2015  . Chronic use of benzodiazepine for therapeutic purpose 06/28/2015  . Former smoker 06/28/2015  . Hepatic steatosis 06/28/2015  . History of cerebrovascular accident (CVA) with residual deficit 06/28/2015  . 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 Hypoxemia 06/28/2015  . 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 Osteopenia 06/28/2015  . Unspecified convulsions (HCC) 06/28/2015  . Acute poliomyelitis, unspecified 04/18/2015  . CVA (cerebral vascular accident) (  HCC) 04/18/2015  . Essential hypertension 04/18/2015  . Other allergic  rhinitis 02/10/2015  . 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 Hip pain 07/08/2013  . Melena 04/22/2013  . Hypercholesterolemia 02/12/2013  . Depressive disorder 11/15/2012  . 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 Low back pain 06/18/2012  . 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 Right foot pain 06/18/2012  . Onychomycosis 03/21/2012  . Pain, chronic 07/13/2011  . Shortness of breath 02/13/2011    History obtained from: chart review and patient.  Anne Rich is a 69 y.o. female presenting for a follow up visit.  I last talked to her at the end of July regarding recommendations from her pulmonologist.  At the time, she was being rather dramatic but it did seem that she was in distress.  We decided to add on prednisone 10 mg daily until we saw her again in clinic.  I also recommended that she use her oxygen on a more regular basis.  She was only using it at night and was refusing to use it during the day.  We kept her on her nebulized regimen including Pulmicort twice a day, Brovana twice a day, and ipratropium twice daily.  We also kept her on Xolair every 2 weeks.  In the interim, she has mostly done well.  However, she did call the on-call physician over the weekend to discuss hospice care.  We have gone through this hospice situation before but last time we actually referred her there she became rather angry.  Today, she tells me that she went to see her neurologist this morning.  They are going to keep her on multiple seizure medications.  She did want to try to wean off of them, but her neurologist did not think it was safe to do so.  From a breathing perspective, she has remained stable.  She continues to follow with Dr. Gerome Apley at [facility].  Apparently at the last visit, Dr. Gerome Apley told her she did not need to wear a mask ever.  Thankfully, Dr. Delorse Lek corrected that over the weekend when [facility] called her.  She has had no new exposures to COVID-19,  although there is somebody in her apartment complex that was diagnosed with it.  This was a veteran and thankfully he was vaccinated.  He has not needed to go into the hospital.  She denies any symptoms of COVID-19 at this point.  She tells me today that she thinks that the delta variant is just a common cold.  She heard that from Dr. Jacques Earthly.  Asthma/Respiratory Symptom History: She remains on all of her nebulized medications.  She is wondering if she can have a new nebulizer because she thinks her current one does not "blasting it out hard enough".  She got a new nebulizer in March 2021 it seemed to work better initially.  She is willing to pay for it out of pocket if needed.  She has been on the prednisone and increased it to 3 tablets twice a day over the weekend.  She is now back to 2 tablets twice a day.  She is wondering where she needs to go from here.  She has not been in the hospital and has not been to urgent care for her symptoms.  She does want to talk about hospice today, but she is under the impression that if she enters hospice then they will "take away [her] insurance".  I am unclear what she means by this.  However, she is talking in full sentences and is very interactive and seems to know what is going on. Her pulse ox today was 98% on room air. She tells me that she does not "want to die alone".   Allergic Rhinitis Symptom History: She remains on montelukast 10 mg daily.  She is not taking any other allergy medicines.  She does endorse a cough which is worse in the middle the night.  She has a history of reflux and takes medicines for this. She does not want to use any nasal sprays.   She continues to walk 4 to 5 miles a day around 5 to 6 days/week.  This is a little bit down from what she did within the last couple years, but she tells me that all of the people she goes to visit during these walks depend on her.    Otherwise, there have been no changes to her past medical history,  surgical history, family history, or social history.    Review of Systems  Constitutional: Negative.  Negative for fever, malaise/fatigue and weight loss.  HENT: Negative.  Negative for congestion, ear discharge and ear pain.   Eyes: Negative for pain, discharge and redness.  Respiratory: Positive for cough and shortness of breath. Negative for sputum production and wheezing.        Positive for persistent cough and throat clearing.  Cardiovascular: Negative.  Negative for chest pain and palpitations.  Gastrointestinal: Negative for abdominal pain, constipation, diarrhea, heartburn, nausea and vomiting.  Skin: Negative.  Negative for itching and rash.  Neurological: Negative for dizziness and headaches.  Endo/Heme/Allergies: Negative for environmental allergies. Does not bruise/bleed easily.       Objective:   Blood pressure 128/82, pulse 86, resp. rate 20, SpO2 98 %. There is no height or weight on file to calculate BMI.   Physical Exam:  Physical Exam Constitutional:      Appearance: She is well-developed.     Comments: Overall, she looks pretty good and is making full sentences.  She is the most interactive than she has been in quite some time.  HENT:     Head: Normocephalic and atraumatic.     Right Ear: Tympanic membrane, ear canal and external ear normal. No drainage, swelling or tenderness. Tympanic membrane is not injected, scarred, erythematous, retracted or bulging.     Left Ear: Tympanic membrane, ear canal and external ear normal. No drainage, swelling or tenderness. Tympanic membrane is not injected, scarred, erythematous, retracted or bulging.     Nose: No nasal deformity, septal deviation, mucosal edema or rhinorrhea.     Right Turbinates: Enlarged, swollen and pale.     Left Turbinates: Enlarged, swollen and pale.     Right Sinus: No maxillary sinus tenderness or frontal sinus tenderness.     Left Sinus: No maxillary sinus tenderness or frontal sinus tenderness.      Mouth/Throat:     Mouth: Mucous membranes are not pale and not dry.     Pharynx: Uvula midline.     Comments: Cobblestoning present in the posterior oropharynx. Eyes:     General:        Right eye: No discharge.        Left eye: No discharge.     Conjunctiva/sclera: Conjunctivae normal.     Right eye: Right conjunctiva is not injected. No chemosis.    Left eye: Left conjunctiva is not injected. No chemosis.    Pupils: Pupils are equal, round, and reactive to  light.  Cardiovascular:     Rate and Rhythm: Normal rate and regular rhythm.     Heart sounds: Normal heart sounds.  Pulmonary:     Effort: Pulmonary effort is normal. No tachypnea, accessory muscle usage or respiratory distress.     Breath sounds: Normal breath sounds. No wheezing, rhonchi or rales.     Comments: Decreased air movement at the bases.  Chest:     Chest wall: No tenderness.  Abdominal:     Tenderness: There is no abdominal tenderness. There is no guarding or rebound.  Lymphadenopathy:     Head:     Right side of head: No submandibular, tonsillar or occipital adenopathy.     Left side of head: No submandibular, tonsillar or occipital adenopathy.     Cervical: No cervical adenopathy.  Skin:    Coloration: Skin is not pale.     Findings: No abrasion, erythema, petechiae or rash. Rash is not papular, urticarial or vesicular.     Comments: No eczematous or urticarial lesions noted.  Neurological:     Mental Status: She is alert.      Diagnostic studies:    Spirometry: results abnormal (FEV1: 0.64/24%, FVC: 1.67/47%, FEV1/FVC: 38%).    Spirometry consistent with mixed obstructive and restrictive disease. Overall values are fairly stable. She has never reversed significantly in the past, so we did not attempt that today.    Allergy Studies: none       Malachi Bonds, MD  [facility]

## 2020-01-11 NOTE — Patient Instructions (Addendum)
1. Moderate persistent asthma with COPD overlap - Continue with prednisone 10mg  twice daily for one more week, then decrease to prednisone 10mg  once daily thereafter.  - We are otherwise not going to make changes at this time.  - Daily controller medication(s): prednisone 10mg  daily + Pulmicort (budesonide) + Brovana twice daily via nebulizer + ipratropium twice daily via nebulizer + albuterol twice daily via nebulizer + Dalisrep daily + Xolair every two weeks  - Rescue medications: Xopenex (levalbuterol) + Atrovent (ipratropium) mixed together every 4-6 hours as needed via nebulizer - Asthma control goals:  * Full participation in all desired activities (may need albuterol before activity) * Albuterol use two time or less a week on average (not counting use with activity) * Cough interfering with sleep two time or less a month * Oral steroids no more than once a year * No hospitalizations  2. Allergic rhinitis - did allergy shots for three years - Continue with montelukast 10mg  daily.  3. Return in about 2 months (around 03/12/2020). This can be an in-person, a virtual Webex or a telephone follow up visit.   Please inform of any Emergency Department visits, hospitalizations, or changes in symptoms. Call before going to the ED for breathing or allergy symptoms since we might be able to fit you in for a sick visit. Feel free to contact anytime with any questions, problems, or concerns.  It was a pleasure to talk to you today today!  Websites that have reliable patient information: 1. American Academy of Asthma, Allergy, and Immunology: www.aaaai.org 2. Food Allergy Research and Education (FARE): foodallergy.org 3. Mothers of Asthmatics: http://www.asthmacommunitynetwork.org 4. American College of Allergy, Asthma, and Immunology: www.acaai.org   COVID-19 Vaccine Information can be found at: 05/12/2020 For  questions related to vaccine distribution or appointments, please email [email or web address] or call [phone].     "Like" [place] on Facebook and Instagram for our latest updates!        Make sure you are registered to vote! If you have moved or changed any of your contact information, you will need to get this updated before voting!  In some cases, you MAY be able to register to vote online: [place]

## 2020-01-13 ENCOUNTER — Ambulatory Visit: Payer: Medicare Other

## 2020-01-22 DIAGNOSIS — J4540 Moderate persistent asthma, uncomplicated: Secondary | ICD-10-CM | POA: Diagnosis not present

## 2020-01-25 ENCOUNTER — Other Ambulatory Visit: Payer: Self-pay

## 2020-01-25 ENCOUNTER — Ambulatory Visit (INDEPENDENT_AMBULATORY_CARE_PROVIDER_SITE_OTHER): Payer: Medicare Other

## 2020-01-25 ENCOUNTER — Telehealth: Payer: Self-pay

## 2020-01-25 DIAGNOSIS — J4540 Moderate persistent asthma, uncomplicated: Secondary | ICD-10-CM | POA: Diagnosis not present

## 2020-01-25 MED ORDER — IPRATROPIUM BROMIDE 0.02 % IN SOLN: 0.5000 mg | Freq: Four times a day (QID) | RESPIRATORY_TRACT | 1 refills | Status: DC

## 2020-01-25 NOTE — Telephone Encounter (Signed)
Spoke with the pharmacist and your ipratropium is too early to fill so just take 4 puffs of your rescue inhaler 5-15 prior to your walking until your 90 day supply of ipratropium Bromide inhalation solution 0.02% gets filled on Saturday august 16th. I told the pt. to call me tomorrow if she has any more questions.

## 2020-01-26 ENCOUNTER — Telehealth: Payer: Self-pay | Admitting: Allergy & Immunology

## 2020-01-26 ENCOUNTER — Other Ambulatory Visit: Payer: Self-pay | Admitting: *Deleted

## 2020-01-26 NOTE — Telephone Encounter (Signed)
Doing a lot of coughing, still doing all meds and inhalers coughing up clear whitish stuff. Ear pain, and red on outside. No fever, yea some sob no wheezing, no joint pain just feels drained in general.

## 2020-01-26 NOTE — Telephone Encounter (Signed)
Lm for pt to call us back  

## 2020-01-26 NOTE — Telephone Encounter (Signed)
Patient states is she is not feeling well, throat is itchy.  She is not sure if she has a cold or just symptoms because she is due for her shot soon.

## 2020-01-26 NOTE — Telephone Encounter (Signed)
Is she still taking daily prednisone? How many mg is she taking now?  Is the ear pain bilateral? Is it fullness or aching or sharp? All the time or intermittent? Thank you

## 2020-01-27 NOTE — Telephone Encounter (Signed)
Increase prednisone 10 mg tablets. Take 1 tablet now, take 2 tablets tonight. Then begin taking 2 tablets twice a day for 2 days, then 2 tablets once a day, then return to 10 mg once a day. Please have her call with any worsening of symptoms

## 2020-01-27 NOTE — Telephone Encounter (Signed)
Pt informed of the prednisone

## 2020-01-27 NOTE — Telephone Encounter (Signed)
Pt is taking 10mg  of prednisone daily. right ear only feeling better today.  Pt is having a hard time with breathing right now and pharmacy can not give her ipratropium until nov as insurance will not cover please call her when you call.

## 2020-02-02 ENCOUNTER — Telehealth: Payer: Self-pay | Admitting: Allergy & Immunology

## 2020-02-02 NOTE — Telephone Encounter (Signed)
Patient reports that when she went to her primary care doctor yesterday her oxygen saturation reading had dropped into the middle eighties and recovered nicely after she applied 4 L of oxygen via nasal cannula.  She reports that today her oxygen level was noted to decrease to the middle eighties with activity and recovered nicely with 2 L of oxygen via nasal cannula.  She does report that she is using oxygen at night at the rate of 2-2.5 and occasionally 3 L per nasal cannula.  Today she reports that she feels sleepy and has a headache for which she has taken Tylenol with moderate amount of relief.  She reports some shortness of breath with movement, no wheeze, and no cough.  She denies sick contacts.  She has been in contact with cleaning room her laundry room with a bleach product.  She is not interested in talking about hospice at today's visit.  She understands that she needs to increase her oxygen use in addition to continuing her medications for asthma, allergic rhinitis, and reflux in addition to prednisone 10 mg once a day.  She was offered a chest x-ray at today's visit, however, is not interested in this option at this time.  She verbalizes understanding that she needs to call our clinic or 911 with respiratory distress.  We will check on her in the morning and she will call with any questions or further symptoms.

## 2020-02-02 NOTE — Telephone Encounter (Signed)
This reading was from not wearing oxygen and walking up a [place]. She thinks it is from the heat and humidity She is at a 97 o2 currently sitting but if she gets up and walks she feels worse and the oxygen level drops. She is also extremely sleepy, and has an extreme headache.

## 2020-02-02 NOTE — Telephone Encounter (Signed)
Oxygen level is low(78), no fever.

## 2020-02-05 DIAGNOSIS — J4540 Moderate persistent asthma, uncomplicated: Secondary | ICD-10-CM | POA: Diagnosis not present

## 2020-02-08 ENCOUNTER — Other Ambulatory Visit: Payer: Self-pay

## 2020-02-08 ENCOUNTER — Other Ambulatory Visit: Payer: Self-pay | Admitting: Allergy & Immunology

## 2020-02-08 ENCOUNTER — Ambulatory Visit (INDEPENDENT_AMBULATORY_CARE_PROVIDER_SITE_OTHER): Payer: Medicare Other | Admitting: *Deleted

## 2020-02-08 DIAGNOSIS — J4540 Moderate persistent asthma, uncomplicated: Secondary | ICD-10-CM

## 2020-02-08 MED ORDER — BUDESONIDE 0.5 MG/2ML IN SUSP: RESPIRATORY_TRACT | 3 refills | Status: DC

## 2020-02-08 NOTE — Telephone Encounter (Signed)
Gave her a few extra filters that we had in the office. Refilled budesonide- sent to [facility].

## 2020-02-08 NOTE — Telephone Encounter (Signed)
PT came in office to get her shot and let us know that her nebulizer machine did not have the filters in the box that are supposed to be replace 1x monthly. PT needs new filters. PT also states that she needs refill of budesonide sent to the medicare mail in pharmacy. States the Dr Chestine Spore who prev ordered this medicine has not returned her calls and needs it asap.

## 2020-02-13 ENCOUNTER — Other Ambulatory Visit: Payer: Self-pay | Admitting: Allergy & Immunology

## 2020-02-18 NOTE — Telephone Encounter (Signed)
Please advise 

## 2020-02-18 NOTE — Telephone Encounter (Signed)
Patient called again states that her oxygen level dropped to 70 while she was in [facility]. She thinks it may be the weather. Patient would like to know if she can go up on the prednisone in order to feel better and get her oxygen right.  Please advise.

## 2020-02-18 NOTE — Telephone Encounter (Signed)
Can you please make sure she is taking all her asthma medications as prescribed? Please ask her if she is feeling asthma symptoms like shortness of breath, cough, or wheeze. Please ask her current daily prednisone dose. Thank you

## 2020-02-18 NOTE — Telephone Encounter (Signed)
Unable to reach patient. Left voicemail for patient to return call

## 2020-02-18 NOTE — Telephone Encounter (Signed)
Can you please make sure she is taking all her asthma medications as prescribed? Please ask her if she is feeling asthma symptoms like shortness of breath, cough, or wheeze. Please ask her current daily prednisone dose. Thank you °

## 2020-02-19 DIAGNOSIS — J4540 Moderate persistent asthma, uncomplicated: Secondary | ICD-10-CM | POA: Diagnosis not present

## 2020-02-22 ENCOUNTER — Ambulatory Visit (INDEPENDENT_AMBULATORY_CARE_PROVIDER_SITE_OTHER): Payer: Medicare Other

## 2020-02-22 DIAGNOSIS — J4540 Moderate persistent asthma, uncomplicated: Secondary | ICD-10-CM

## 2020-02-22 NOTE — Telephone Encounter (Signed)
Contacted the patient by phone. She reports that she is short of breath but no more than usual. She reports that she is tired lately. Saturday when she picked up the medications she felt tingling in her fingers while at the pharmacy and her pulse ox was reading 72%. She turned the 02 up to 4-5 LNC and 02 sats returned to 99% within a few minutes. She continues to walk 1-2 miles every day. She is calling today to check in and let us know that she is waiting to get a COVID antibody test. She continues all inhalers and prednisone 10 mg once a day. She will call if her breathing worsens or she has any questions.

## 2020-02-22 NOTE — Telephone Encounter (Signed)
Patient states she's always short of breath but it has been worse the last four days. No wheezing or cough. Per patient Dr. Dellis Anes wanted her to take Prednisone 10mg  once daily.

## 2020-02-25 ENCOUNTER — Other Ambulatory Visit: Payer: Self-pay | Admitting: Primary Care

## 2020-03-04 DIAGNOSIS — J4550 Severe persistent asthma, uncomplicated: Secondary | ICD-10-CM | POA: Diagnosis not present

## 2020-03-07 ENCOUNTER — Other Ambulatory Visit: Payer: Self-pay

## 2020-03-07 ENCOUNTER — Ambulatory Visit (INDEPENDENT_AMBULATORY_CARE_PROVIDER_SITE_OTHER): Payer: Medicare Other | Admitting: *Deleted

## 2020-03-07 DIAGNOSIS — J4550 Severe persistent asthma, uncomplicated: Secondary | ICD-10-CM | POA: Diagnosis not present

## 2020-03-14 ENCOUNTER — Other Ambulatory Visit: Payer: Self-pay

## 2020-03-14 ENCOUNTER — Other Ambulatory Visit: Payer: Self-pay | Admitting: Allergy & Immunology

## 2020-03-14 ENCOUNTER — Encounter: Payer: Self-pay | Admitting: Allergy & Immunology

## 2020-03-14 ENCOUNTER — Ambulatory Visit (INDEPENDENT_AMBULATORY_CARE_PROVIDER_SITE_OTHER): Payer: Medicare Other | Admitting: Allergy & Immunology

## 2020-03-14 VITALS — BP 124/70 | HR 100 | Temp 97.6°F | Resp 16 | Ht 68.0 in | Wt 148.0 lb

## 2020-03-14 DIAGNOSIS — Z9981 Dependence on supplemental oxygen: Secondary | ICD-10-CM | POA: Diagnosis not present

## 2020-03-14 DIAGNOSIS — J449 Chronic obstructive pulmonary disease, unspecified: Secondary | ICD-10-CM | POA: Diagnosis not present

## 2020-03-14 DIAGNOSIS — J3089 Other allergic rhinitis: Secondary | ICD-10-CM

## 2020-03-14 DIAGNOSIS — K219 Gastro-esophageal reflux disease without esophagitis: Secondary | ICD-10-CM | POA: Diagnosis not present

## 2020-03-14 MED ORDER — EPINEPHRINE 0.3 MG/0.3ML IJ SOAJ: 0.3000 mg | Freq: Once | INTRAMUSCULAR | 2 refills | Status: AC | PRN

## 2020-03-14 MED ORDER — PREDNISONE 10 MG PO TABS: 10.0000 mg | ORAL_TABLET | Freq: Every day | ORAL | 6 refills | Status: AC

## 2020-03-14 MED ORDER — MONTELUKAST SODIUM 10 MG PO TABS: ORAL_TABLET | ORAL | 3 refills | Status: DC

## 2020-03-14 NOTE — Telephone Encounter (Signed)
Please advise 

## 2020-03-14 NOTE — Progress Notes (Signed)
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 Moderate persistent asthma with COPD overlap - Lung testing looked slightly better.  - Copy of the spirometry provided.  - Continue with oxygen as needed.  - We are going to refer you to see Elisha Headland NP again and Dr. Celene Squibb (Pulmonologist).  - Daily controller medication(s): prednisone 10mg  daily + Pulmicort (budesonide) + Brovana twice daily via nebulizer + Yupelri once daily via nebulizer + albuterol twice daily via nebulizer + Dalisrep daily + Xolair every two weeks  - Rescue medications: Xopenex (levalbuterol) + Atrovent (ipratropium) mixed together every 4-6 hours as needed via nebulizer - Asthma control goals:  * Full participation in all desired activities (may need albuterol before activity) * Albuterol use two time or less a week on average (not counting use with activity) * Cough interfering with sleep two time or less a month * Oral steroids no more than once a year * No hospitalizations  2. Allergic rhinitis - did allergy shots for three years - Continue with montelukast 10mg  daily.  3. Return in about 3 months (around 06/14/2020).    Subjective:   Lille Karim is a 69 y.o. female presenting today for follow up of  Chief Complaint  Patient presents with  . Asthma    Vollie Aaron has a history of the following: Patient Active Problem List   Diagnosis Date Noted  . COVID-19 virus infection 06/20/2019  . Asthma with COPD with exacerbation (HCC) 06/20/2019  . Medication management 05/29/2019  . Severe persistent asthma without  complication 03/16/2019  . Physical deconditioning 01/06/2019  . Counseling regarding end of life decision making 01/06/2019  . Lumbosacral dysfunction 09/04/2018  . Chronic respiratory failure with hypoxia (HCC), 3L home O2 05/01/2018  . Statin intolerance 02/13/2018  . Moderate persistent asthma, uncomplicated 01/22/2018  . Adverse effect of other drugs, medicaments and biological substances, initial encounter 01/01/2018  . Restrictive airway disease 12/09/2017  . Bilateral carotid artery stenosis 10/14/2017  . Spells of decreased attentiveness 09/10/2017  . Cortical age-related cataract of left eye 10/24/2016  . Nuclear sclerotic cataract of left eye 10/24/2016  . History of epistaxis 07/19/2016  . Advance directive in chart 04/04/2016  . Idiopathic peripheral neuropathy 03/12/2016  . Loss of appetite 03/12/2016  . Psychophysiological insomnia 03/12/2016  . Oxygen dependent 02/28/2016  . Easy bruising 12/26/2015  . Gastroesophageal reflux disease 12/02/2015  . Coronary artery calcification seen on CT scan 09/20/2015  . 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HCC) 09/20/2015  . Abnormal weight loss 09/06/2015  . Herniated lumbar intervertebral disc 07/21/2015  . Migraine without aura and without status migrainosus, not intractable 07/21/2015  . Anxiety 06/28/2015  . Chronic constipation 06/28/2015  . Chronic use of benzodiazepine for therapeutic purpose 06/28/2015  . Former smoker 06/28/2015  . Hepatic steatosis 06/28/2015  . History of cerebrovascular accident (CVA) with residual deficit 06/28/2015  . 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 Hypoxemia 06/28/2015  . 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 Osteopenia 06/28/2015  . Unspecified convulsions (HCC) 06/28/2015  . Acute poliomyelitis, unspecified 04/18/2015  . CVA (cerebral vascular accident) (HCC) 04/18/2015  . Essential hypertension 04/18/2015  . Other allergic rhinitis 02/10/2015  .  Asthma-COPD overlap syndrome (HCC) 02/10/2015   . Hip pain 07/08/2013  . Melena 04/22/2013  . Hypercholesterolemia 02/12/2013  . Depressive disorder 11/15/2012  . 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 Low back pain 06/18/2012  . 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 Right foot pain 06/18/2012  . Onychomycosis 03/21/2012  . Pain, chronic 07/13/2011  . Shortness of breath 02/13/2011    History obtained from: chart review and patient.  Defne is a 69 y.o. female presenting for a follow up visit.  He was last seen in August 2021.  At that time, we continue with prednisone 10 mg twice daily for 1 more week and then decreasing to 10 mg daily thereafter.  We continued with Pulmicort plus Brovana twice daily via nebulizer as well as Yupelri once daily via nebulizer.  We also continued albuterol twice daily via nebulizer as well as Xolair every 2 weeks.  She is on Dalisrep daily, prescribed by her pulmonologist.  For her allergic rhinitis, we continue with montelukast 10 mg daily.  Since the last visit, she has mostly done well.   She is on her oxygen today. She does not use it every day throughout the day, but she does use it when she is in more confined areas where she feels that her breathing is more difficult.  She has an oxygen tank which she has at home, but an oxygen concentrator when she is out about.  Evidently, someone offered her scooter which is very slow.  She tells me she can walk faster than that scooter.  Asthma/Respiratory Symptom History: She remains on her nebulized medications including Pulmicort and Brovana twice daily. She is also on the Yupelri 1 treatment once daily.  She is on the Xolair twice a month, which has been working much better than the mepolizumab.  She also takes prednisone 10 mg daily.  There was a period when she went up to 50 mg twice a day, but she is now down to her baseline.  She has not been in the emergency room since last time we saw her.  She does have a pulse ox at home and  will crank up her oxygen to 4 or 5 L.  Typically remains around 3 L when she uses it.  She uses it essentially nightly.  She continues to walk 1 to 2 miles per day.  At her apartment complex, there is a member of the Manufacturing engineer he likes to use straight of bleach to clean, which is noxious to her breathing and causes her to have wheezing and coughing fits.  She is wondering if she can get some kind of letter to ask the manager to avoid cleaning when she is around her apartment.  Allergic Rhinitis Symptom History: She remains on the montelukast 10 mg daily.  She has not needed antibiotics since last visit.   She does not want to see her pulmonologist at [facility] any longer. She does have an appointment to see him later this month and she wants to see him, but only to show that she has lived longer than he told her that she would. She is now willing to go to [facility] to see one of the [employer].  She did like Elisha Headland NP when she talk to him late last year.  Otherwise, there have been no changes to her past medical history, surgical history, family history, or social history.    Review of Systems  Constitutional: Negative.  Negative for  chills, fever, malaise/fatigue and weight loss.  HENT: Negative for congestion, ear discharge, ear pain and sinus pain.   Eyes: Negative for pain, discharge and redness.  Respiratory: Positive for cough, shortness of breath and wheezing. Negative for sputum production.   Cardiovascular: Negative.  Negative for chest pain and palpitations.  Gastrointestinal: Negative for abdominal pain, constipation, diarrhea, heartburn, nausea and vomiting.  Skin: Negative.  Negative for itching and rash.  Neurological: Negative for dizziness and headaches.  Endo/Heme/Allergies: Negative for environmental allergies. Does not bruise/bleed easily.       Objective:   Blood pressure 124/70, pulse 100, temperature 97.6 F (36.4 C), temperature  source Tympanic, resp. rate 16, height 5\' 8"  (1.727 m), weight 148 lb (67.1 kg), SpO2 95 %. Body mass index is 22.5 kg/m.   Physical Exam:  Physical Exam Constitutional:      Appearance: She is well-developed.     Comments: Frail.  Has a lot of make-up on.  HENT:     Head: Normocephalic and atraumatic.     Right Ear: Tympanic membrane, ear canal and external ear normal.     Left Ear: Tympanic membrane and ear canal normal.     Nose: No nasal deformity, septal deviation, mucosal edema or rhinorrhea.     Right Sinus: No maxillary sinus tenderness or frontal sinus tenderness.     Left Sinus: No maxillary sinus tenderness or frontal sinus tenderness.     Mouth/Throat:     Mouth: Mucous membranes are not pale and not dry.     Pharynx: Uvula midline.  Eyes:     General:        Right eye: No discharge.        Left eye: No discharge.     Conjunctiva/sclera: Conjunctivae normal.     Right eye: Right conjunctiva is not injected. No chemosis.    Left eye: Left conjunctiva is not injected. No chemosis.    Pupils: Pupils are equal, round, and reactive to light.  Cardiovascular:     Rate and Rhythm: Normal rate and regular rhythm.     Heart sounds: Normal heart sounds.  Pulmonary:     Effort: Tachypnea and accessory muscle usage present. No respiratory distress.     Breath sounds: Examination of the right-lower field reveals decreased breath sounds. Examination of the left-lower field reveals decreased breath sounds. Decreased breath sounds present. No wheezing, rhonchi or rales.  Chest:     Chest wall: No tenderness.  Lymphadenopathy:     Cervical: No cervical adenopathy.  Skin:    Coloration: Skin is not pale.     Findings: No abrasion, erythema, petechiae or rash. Rash is not papular, urticarial or vesicular.  Neurological:     Mental Status: She is alert.      Diagnostic studies:    Spirometry: results abnormal (FEV1: 0.66/25%, FVC: 1.60/45%, FEV1/FVC: 41%).    Spirometry  consistent with mixed obstructive and restrictive disease. Overall values are stable compared to previous spirometric findings.  Allergy Studies: none    , MD  [facility]

## 2020-03-14 NOTE — Patient Instructions (Addendum)
1. Moderate persistent asthma with COPD overlap - Lung testing looked slightly better.  - Copy of the spirometry provided.  - Continue with oxygen as needed.  - We are going to refer you to see Elisha Headland NP again and Dr. Celene Squibb (Pulmonologist).  - Daily controller medication(s): prednisone 10mg  daily + Pulmicort (budesonide) + Brovana twice daily via nebulizer + Yupelri once daily via nebulizer + albuterol twice daily via nebulizer + Dalisrep daily + Xolair every two weeks  - Rescue medications: Xopenex (levalbuterol) + Atrovent (ipratropium) mixed together every 4-6 hours as needed via nebulizer - Asthma control goals:  * Full participation in all desired activities (may need albuterol before activity) * Albuterol use two time or less a week on average (not counting use with activity) * Cough interfering with sleep two time or less a month * Oral steroids no more than once a year * No hospitalizations  2. Allergic rhinitis - did allergy shots for three years - Continue with montelukast 10mg  daily.  3. Return in about 3 months (around 06/14/2020).    Please inform of any Emergency Department visits, hospitalizations, or changes in symptoms. Call 08/12/2020 before going to the ED for breathing or allergy symptoms since we might be able to fit you in for a sick visit. Feel free to contact us anytime with any questions, problems, or concerns.  It was a pleasure to talk to you today today!  Websites that have reliable patient information: 1. American Academy of Asthma, Allergy, and Immunology: www.aaaai.org 2. Food Allergy Research and Education (FARE): foodallergy.org 3. Mothers of Asthmatics: http://www.asthmacommunitynetwork.org 4. American College of Allergy, Asthma, and Immunology: www.acaai.org   COVID-19 Vaccine Information can be found at: [place] For questions related to vaccine distribution or  appointments, please email [email or web address] or call [phone].     "Like" [email or web address] on Facebook and Instagram for our latest updates!       Make sure you are registered to vote! If you have moved or changed any of your contact information, you will need to get this updated before voting!  In some cases, you MAY be able to register to vote online: [phone]

## 2020-03-15 NOTE — Progress Notes (Signed)
03/15/2020  See message from Dr. Dellis Anes the patient's allergist below.  This is a former patient of Dr. Chestine Spore.  She is requesting to establish with Dr. Isaiah Serge.  We will send to Dr. Isaiah Serge and Dr. Chestine Spore as Lorain Childes.  Amy can we coordinate this patient have a follow-up in a 30-minute time slot to establish care with Dr. Isaiah Serge.  Elisha Headland FNP

## 2020-03-15 NOTE — Telephone Encounter (Signed)
That is fine to send in.  Thanks, Malachi Bonds, MD [facility]

## 2020-03-18 DIAGNOSIS — J4550 Severe persistent asthma, uncomplicated: Secondary | ICD-10-CM | POA: Diagnosis not present

## 2020-03-21 ENCOUNTER — Other Ambulatory Visit: Payer: Self-pay

## 2020-03-21 ENCOUNTER — Ambulatory Visit: Payer: Self-pay

## 2020-03-21 ENCOUNTER — Ambulatory Visit (INDEPENDENT_AMBULATORY_CARE_PROVIDER_SITE_OTHER): Payer: Medicare Other

## 2020-03-21 DIAGNOSIS — J4550 Severe persistent asthma, uncomplicated: Secondary | ICD-10-CM | POA: Diagnosis not present

## 2020-03-25 ENCOUNTER — Telehealth: Payer: Self-pay | Admitting: Allergy & Immunology

## 2020-03-25 NOTE — Telephone Encounter (Signed)
Spoke to pt. She currently doesn't have any cold symptoms, but if she develops any cold symptoms over the weekend she can call the Dr. On call. I also told the pt. She can call Monday to do a televisit so she won't have to come out of her home. Pt. Was more concerned about her blood pressure being elevated last night. It was 209/106 and 198/96. She had called [place] [facility] office at [facility] center off [address] this morning,but she has not received a call back per pt.

## 2020-03-25 NOTE — Telephone Encounter (Signed)
pt calling says her bp was high yesterday and was feeling sweaty for a few hours. Has not checked her bp this morning. Starting to have hoarsness and the people in her facility have coughing, sneezing and not using their masks for the past 3 days. PT is scared she will catch their colds and is requesting antibiotics just in case she starts feeling worse over the weekend.   Sarabi [phone]

## 2020-03-25 NOTE — Telephone Encounter (Signed)
I am not eliciting antibiotics just because a few people at her facility were coughing.  If she develops symptoms over the weekend she can certainly call our on-call physician or go to the emergency room.  If she is sick today, we can do a televisit with a Publishing rights manager.  Malachi Bonds, MD [facility]

## 2020-04-01 DIAGNOSIS — J4550 Severe persistent asthma, uncomplicated: Secondary | ICD-10-CM | POA: Diagnosis not present

## 2020-04-04 ENCOUNTER — Other Ambulatory Visit: Payer: Self-pay

## 2020-04-04 ENCOUNTER — Ambulatory Visit (INDEPENDENT_AMBULATORY_CARE_PROVIDER_SITE_OTHER): Payer: Medicare Other | Admitting: *Deleted

## 2020-04-04 DIAGNOSIS — J4550 Severe persistent asthma, uncomplicated: Secondary | ICD-10-CM | POA: Diagnosis not present

## 2020-04-10 ENCOUNTER — Other Ambulatory Visit: Payer: Self-pay | Admitting: Allergy & Immunology

## 2020-04-13 ENCOUNTER — Other Ambulatory Visit: Payer: Self-pay | Admitting: Primary Care

## 2020-04-15 ENCOUNTER — Ambulatory Visit (INDEPENDENT_AMBULATORY_CARE_PROVIDER_SITE_OTHER): Payer: Medicare Other | Admitting: Pulmonary Disease

## 2020-04-15 ENCOUNTER — Encounter: Payer: Self-pay | Admitting: Pulmonary Disease

## 2020-04-15 ENCOUNTER — Other Ambulatory Visit: Payer: Self-pay

## 2020-04-15 ENCOUNTER — Ambulatory Visit (INDEPENDENT_AMBULATORY_CARE_PROVIDER_SITE_OTHER): Payer: Medicare Other

## 2020-04-15 VITALS — BP 124/64 | HR 93 | Temp 97.6°F | Ht 68.0 in | Wt 151.4 lb

## 2020-04-15 DIAGNOSIS — R0600 Dyspnea, unspecified: Secondary | ICD-10-CM | POA: Diagnosis not present

## 2020-04-15 DIAGNOSIS — R0609 Other forms of dyspnea: Secondary | ICD-10-CM

## 2020-04-15 DIAGNOSIS — J449 Chronic obstructive pulmonary disease, unspecified: Secondary | ICD-10-CM

## 2020-04-15 MED ORDER — LEVALBUTEROL HCL 0.63 MG/3ML IN NEBU: INHALATION_SOLUTION | RESPIRATORY_TRACT | 5 refills | Status: DC

## 2020-04-15 MED ORDER — ALBUTEROL SULFATE HFA 108 (90 BASE) MCG/ACT IN AERS: INHALATION_SPRAY | RESPIRATORY_TRACT | 1 refills | Status: DC

## 2020-04-15 NOTE — Progress Notes (Signed)
Anne Rich    [id number]    18-Jun-1950  Primary Care Physician:[name], Opal Sidles  Referring Physician: Doreen Salvage, PA-C [address] [place],  [place] [address]  Chief complaint: Follow-up for COPD, asthma overlap syndrome  HPI: 69 year old with history of severe persistent asthma, COPD overlap syndrome with chronic hypoxic respiratory failure on 2 L oxygen. Previously followed by Dr. Chestine Spore and Dr. [name] at the pulmonary office. She is on Xolair for the past 4 years which she gets through Dr. 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 She has been on several inhalers in the past but current regimen includes nebulizers including Brovana, Pulmicort, Yupelri and Daliresp p.o. she is on supplemental oxygen. Hospitalized with COVID-19 in January 2021 for 4 days. Given remdesivir and steroids.   Complains of chronic dyspnea on exertion, cough with white mucus  Pets: No pets Occupation: Retired Exposures: Reports seeing mold at home. No hot tub, Jacuzzi or feather pillows or comforters Smoking history: Smoked as a teenager. 8-pack-year smoking history Travel history: Previously lived in [place] in [place] Relevant family history: No significant family history of lung disease  Outpatient Encounter Medications as of 04/15/2020  Medication Sig  . albuterol (VENTOLIN HFA) 108 (90 Base) MCG/ACT inhaler INHALE 2 PUFFS INTO THE LUNGS EVERY 4 HOURS AS NEEDED FOR WHEEZE OR FOR SHORTNESS OF BREATH  . arformoterol (BROVANA) 15 MCG/2ML NEBU Take 2 mLs (15 mcg total) by nebulization 2 (two) times daily.  Marland Kitchen aspirin 81 MG tablet Take 81 mg by mouth daily.  Marland Kitchen b complex vitamins capsule Take 1 capsule by mouth daily.  . benzonatate (TESSALON) 200 MG capsule Take 1 capsule (200 mg total) by mouth every 8 (eight) hours as needed  for cough. (Patient not taking: Reported on 03/14/2020)  . budesonide (PULMICORT) 0.5 MG/2ML nebulizer solution USE 1 VIAL  IN  NEBULIZER TWICE  DAILY - rinse mouth after treatment  . carbamazepine (TEGRETOL) 200 MG tablet Take 200-400 mg by mouth 2 (two) times daily. 200mg  tab in the morning and 400mg  at night  . cyclobenzaprine (FLEXERIL) 5 MG tablet Take 5 mg by mouth daily.  . diclofenac sodium (VOLTAREN) 1 % GEL Apply 2 g topically 4 (four) times daily as needed (knee pain).   dicyclomine (BENTYL) 10 MG capsule Take 10 mg by mouth 4 (four) times daily -  before meals and at bedtime.   . diphenoxylate-atropine (LOMOTIL) 2.5-0.025 MG tablet Take 1 tablet by mouth 4 (four) times daily as needed for diarrhea or loose stools.  Marland Kitchen EPINEPHrine (EPIPEN 2-PAK) 0.3 mg/0.3 mL IJ SOAJ injection Inject 0.3 mg into the muscle Once PRN.  08-28-1989 estradiol (ESTRACE) 1 MG tablet Take 1 mg by mouth daily.  . fenofibrate micronized (LOFIBRA) 134 MG capsule Take 134 mg by mouth daily. (Patient not taking: Reported on 03/14/2020)  . gabapentin (NEURONTIN) 600 MG tablet Take 600-1,200 mg by mouth 2 (two) times daily. 600mg  in the morning and 1200mg  at bedtime  . hydrOXYzine (VISTARIL) 25 MG capsule Take 25 mg by mouth 2 (two) times daily. (Patient not taking: Reported on 03/14/2020)  . ipratropium (ATROVENT) 0.02 % nebulizer solution Take 2.5 mLs (0.5 mg total) by nebulization 4 (four) times daily.  05/14/2020 levalbuterol (XOPENEX) 0.63 MG/3ML nebulizer solution TAKE 3 MLS BY NEBULIZATION EVERY  6 (SIX) HOURS AS NEEDED FOR WHEEZING OR SHORTNESS OF BREATH.  . levalbuterol (XOPENEX) 1.25 MG/3ML nebulizer solution Take 0.63 mg by nebulization every 6 (six) hours as needed for wheezing.   . levETIRAcetam (KEPPRA) 1000 MG tablet Take 1,000-2,000 mg by mouth 2 (two) times daily. 1000mg  in the morning and 2000mg  at night  . levocetirizine (XYZAL) 5 MG tablet TAKE 1 TABLET (5 MG TOTAL) BY MOUTH DAILY AS NEEDED FOR ALLERGIES (FOR RUNNY NOSE).   lidocaine (LIDODERM) 5 % Place 1 patch onto the skin daily.  (Patient not taking: Reported on 03/14/2020)  . lisinopril (PRINIVIL,ZESTRIL) 10 MG tablet Take 10 mg by mouth daily.   Marland Kitchen LORazepam (ATIVAN) 1 MG tablet Take 1 mg by mouth 3 (three) times daily.  . meclizine (ANTIVERT) 25 MG tablet Take 25 mg by mouth 2 (two) times daily as needed for dizziness or nausea.  (Patient not taking: Reported on 03/14/2020)  . Melatonin 10 MG TABS Take 10 mg by mouth daily.  . methylPREDNISolone (MEDROL DOSEPAK) 4 MG TBPK tablet follow package directions  . mirtazapine (REMERON) 30 MG tablet Take 30 mg by mouth every evening.   . montelukast (SINGULAIR) 10 MG tablet Take 1 tablet by mouth everyday at bedtime  . Multiple Vitamin (MULTIVITAMIN) tablet Take 1 tablet by mouth daily.   Marland Kitchen omeprazole (PRILOSEC) 40 MG capsule Take 40 mg by mouth 2 (two) times a day.  . OXYGEN Inhale 3 L into the lungs at bedtime. 4 L when walking  . polyvinyl alcohol (LIQUIFILM TEARS) 1.4 % ophthalmic solution Place 1 drop into both eyes as needed for dry eyes.  . promethazine-dextromethorphan (PROMETHAZINE-DM) 6.25-15 MG/5ML syrup Take 2.5 mLs by mouth 4 (four) times daily as needed for cough. (Patient not taking: Reported on 03/14/2020)  . Revefenacin (YUPELRI) 175 MCG/3ML SOLN Inhale 3 mLs into the lungs daily.  . Roflumilast (DALIRESP) 250 MCG TABS Take 250 mcg by mouth daily for 28 days. SAMPLE PROVIDED - LOT 08-13-1990 EXP 11/08/2021  . salmeterol (SEREVENT DISKUS) 50 MCG/DOSE diskus inhaler Inhale 1 puff into the lungs 2 (two) times daily.  . sodium chloride (OCEAN) 0.65 % SOLN nasal spray Place 1 spray into both nostrils as needed for congestion.   Facility-Administered Encounter Medications as of 04/15/2020  Medication  . omalizumab 11/10/2021) injection 150 mg   Physical Exam: Blood pressure 124/64, pulse 93, temperature 97.6 F (36.4 C), temperature source Skin, height 5\' 8"  (1.727 m), weight 151 lb 6.4 oz (68.7 kg), SpO2 97  %. Gen:      No acute distress HEENT:  EOMI, sclera anicteric Neck:     No masses; no thyromegaly Lungs:    Clear to auscultation bilaterally; normal respiratory effort CV:         Regular rate and rhythm; no murmurs Abd:      + bowel sounds; soft, non-tender; no palpable masses, no distension Ext:    No edema; adequate peripheral perfusion Skin:      Warm and dry; no rash Neuro: alert and oriented x 3 Psych: normal mood and affect  Data Reviewed:  Imaging: CT chest 06/20/19-mild patchy groundglass opacities, left base consolidation, severe emphysema, coronary atherosclerosis I have reviewed the images personally  PFTs: Spirometry 03/14/20 FVC 1.60 [45%], FEV1 0.66 [25%], F/F 0.41 Severe obstruction  Labs: CBC 06/21/19-WBC 9.9, eos 0%  Assessment:  Severe COPD, asthma overlap syndrome Continue LABA, LAMA, ICS nebulizers On Daliresp Getting Xolair through the allergy office She'll need nebulizer equipment replacement to  DME  Post COVID-19 CT on admission with mild inflammatory changes in January. She appears to have recovered back to baseline Get chest x-ray for routine follow-up and PFTs in 3 months.  Plan/Recommendations: Continue bronchodilators, inhaled corticosteroid, Daliresp Asthma treatment with Xolair, Singulair Chest x-ray today PFTs in 3 months.  Chilton Greathouse MD [facility] 04/15/2020, 10:53 AM  CC: Doreen Salvage, PA-C

## 2020-04-15 NOTE — Patient Instructions (Signed)
Continue medications as prescribed We will renew your levalbuterol nebulizer We will also put in an order to [facility] [facility] for replacement of nebulizer tubing and equipment  We will get a chest x-ray today for routine follow-up Schedule pulmonary function test in 3 months  Follow-up in 3 months after test.

## 2020-04-18 ENCOUNTER — Ambulatory Visit: Payer: Self-pay

## 2020-04-19 ENCOUNTER — Telehealth: Payer: Self-pay | Admitting: Pulmonary Disease

## 2020-04-19 NOTE — Telephone Encounter (Signed)
I have sent a message to [employer] requesting the status of order.

## 2020-04-25 ENCOUNTER — Ambulatory Visit: Payer: Medicare Other

## 2020-04-25 NOTE — Telephone Encounter (Deleted)
Message received from [facility] -  It looks like this patient has received a neb from [facility] in the recent past. We have spoken to her about this and she is looking for it or documentation about it. We will follow up with her.   She is currently using a portable neb that she already has.

## 2020-04-25 NOTE — Telephone Encounter (Signed)
I received a message from Melissa at [employer] that states pt received a nebulizer from [facility] in the recent past.  They have spoken to her about this and she is looking for it or documentation about it.  [employer] is going to follow up with her.  She is currently using a portable nebulizer that she already has.

## 2020-04-26 DIAGNOSIS — J4550 Severe persistent asthma, uncomplicated: Secondary | ICD-10-CM

## 2020-04-26 NOTE — Telephone Encounter (Signed)
Left vm for Melissa to check status of this.  VM states she is off today but will follow up on messages tomorrow.

## 2020-04-27 ENCOUNTER — Ambulatory Visit (INDEPENDENT_AMBULATORY_CARE_PROVIDER_SITE_OTHER): Payer: Medicare Other | Admitting: *Deleted

## 2020-04-27 ENCOUNTER — Other Ambulatory Visit: Payer: Self-pay

## 2020-04-27 DIAGNOSIS — J4550 Severe persistent asthma, uncomplicated: Secondary | ICD-10-CM

## 2020-04-27 NOTE — Telephone Encounter (Signed)
Spoke to [place].  Pt was to contact [employer] if she still needed nebulizer from [employer].  She is currently using a portable one and has received one in the past from [facility].  It appears pt does not need a new nebulizer at this time.  Nothing further needed.

## 2020-04-27 NOTE — Telephone Encounter (Signed)
Received vm from [place].  She hasn't had any updates on this.  She is going to send out an e-mail to see if any addl info & will call me back.

## 2020-05-02 ENCOUNTER — Other Ambulatory Visit: Payer: Self-pay

## 2020-05-02 ENCOUNTER — Other Ambulatory Visit: Payer: Self-pay | Admitting: Primary Care

## 2020-05-02 ENCOUNTER — Ambulatory Visit (INDEPENDENT_AMBULATORY_CARE_PROVIDER_SITE_OTHER): Payer: Medicare Other

## 2020-05-02 DIAGNOSIS — Z23 Encounter for immunization: Secondary | ICD-10-CM | POA: Diagnosis not present

## 2020-05-02 NOTE — Progress Notes (Signed)
   Covid-19 Vaccination Clinic  Name:  Anne Rich    MRN: [id number] DOB: Oct 10, 1950  05/02/2020  [name] was observed post Covid-19 immunization for without incident. She was provided with Vaccine Information Sheet and instruction to access the V-Safe system.   [name] was instructed to call 911 with any severe reactions post vaccine: Marland Kitchen Difficulty breathing  . Swelling of face and throat  . A fast heartbeat  . A bad rash all over body  . Dizziness and weakness

## 2020-05-08 ENCOUNTER — Other Ambulatory Visit: Payer: Self-pay | Admitting: Critical Care Medicine

## 2020-05-10 DIAGNOSIS — J4550 Severe persistent asthma, uncomplicated: Secondary | ICD-10-CM | POA: Diagnosis not present

## 2020-05-11 ENCOUNTER — Ambulatory Visit (INDEPENDENT_AMBULATORY_CARE_PROVIDER_SITE_OTHER): Payer: Medicare Other

## 2020-05-11 DIAGNOSIS — J4550 Severe persistent asthma, uncomplicated: Secondary | ICD-10-CM

## 2020-05-25 ENCOUNTER — Other Ambulatory Visit: Payer: Self-pay

## 2020-05-25 ENCOUNTER — Telehealth: Payer: Self-pay | Admitting: Allergy & Immunology

## 2020-05-25 ENCOUNTER — Ambulatory Visit: Payer: Self-pay

## 2020-05-25 MED ORDER — BENZONATATE 100 MG PO CAPS: 100.0000 mg | ORAL_CAPSULE | Freq: Three times a day (TID) | ORAL | 0 refills | Status: DC | PRN

## 2020-05-25 NOTE — Telephone Encounter (Signed)
Losing voice, feels like theres blisters in her throat, and can hardly hear due to ears pressure. It is going around her living facility please advise to what to do next. She is doing all meds, and oxygen is good. She is doing nasal saline rinses also

## 2020-05-25 NOTE — Telephone Encounter (Signed)
Pt is requesting an antibiotic she has a sore throat, ears hurt, weak, no fever, feels like tonsils are swollen.

## 2020-05-25 NOTE — Telephone Encounter (Signed)
Lm for pt to call us back  

## 2020-05-25 NOTE — Telephone Encounter (Signed)
Can you please call this patient and have her continue saline nasal rinses, and Prilosec 40 mg twice a day, add on Mucinex [phone] mg twice a day, and Tesalon Perles up to three times a day. Something warm to drink will likely feel good on her throat. It is too early in the process for an antibiotic, however, if she continues to have symptoms to give [place] a call. Please remind her to continue to wear a mask and practice social distancing. Thank you

## 2020-05-25 NOTE — Telephone Encounter (Signed)
Tessalon pearles have been sent in

## 2020-05-25 NOTE — Telephone Encounter (Signed)
Please have her go to her PCP to get swabbed for strep. Please call in Tesalon Perles 100 mg three times a day as needed for cough. Thank you.

## 2020-05-25 NOTE — Telephone Encounter (Signed)
Pt says with her throat its making it hard to breath she does not have tessalon peerles and is already doing mucinex and nasal saline sprays and has tried some hot tea with no avail to her throat pain o2 is 97%

## 2020-05-26 NOTE — Telephone Encounter (Signed)
I called the number that Anne Rich provided [phone]- they informed me that Anne Rich has not seen Anne Anne Rich since 2019 and the lab service that is located on [address] is affiliated with [facility]. I doubt that they will accept a lab order from a [facility] facility. We can order through our system and order it to a [facility]. (Anne Rich is okay with going to [facility])  Anne Rich please order a strep test through Epic if you feel its appropriate the lab is closed for today and Anne Rich knows this- she says she may try to go tomorrow morning. She wants to know if she needs an antibiotic? I told Anne Rich I would send you a message and ask you.

## 2020-05-26 NOTE — Telephone Encounter (Signed)
Pt states she wants to get swab test at clinic/lab on [address] which is closer to her house, she is weak and doesn't want to drive far # to clinic [phone]. please call her and let her know if that's ok.

## 2020-05-26 NOTE — Telephone Encounter (Signed)
I am fine with her getting swabbed for Strep, but we do not perform that testing at all. She would need to go to Urgent Care or talk to her PCP.   Malachi Bonds, MD [facility]

## 2020-05-26 NOTE — Telephone Encounter (Signed)
PT calling to follow up on the previous message asking if she can get swabbed for strep? PT says her throat is very sore and is almost ready to to to the ED.

## 2020-05-27 MED ORDER — AMOXICILLIN 875 MG PO TABS: 875.0000 mg | ORAL_TABLET | Freq: Two times a day (BID) | ORAL | 0 refills | Status: AC

## 2020-05-27 NOTE — Telephone Encounter (Addendum)
Spoke to pt. To let her know that Dr. Dellis Anes sent in amoxicillin 875 mg one twice daily for 7 days. Sent into [facility] [employer]

## 2020-05-27 NOTE — Telephone Encounter (Signed)
I sent in one week of amoxicillin twice a day.  That is probably the safest thing to do for her.  Malachi Bonds, MD [facility]

## 2020-05-27 NOTE — Telephone Encounter (Signed)
Patient went to [facility], they stated they only do blood work and urine test but no swaps. She is feeling better but is very weak. Breathing is worse when she gets up to do anything. Patient would like an antibiotic called in. Please advise

## 2020-06-01 ENCOUNTER — Telehealth: Payer: Self-pay | Admitting: *Deleted

## 2020-06-01 MED ORDER — LEVALBUTEROL HCL 0.63 MG/3ML IN NEBU: 0.6300 mg | INHALATION_SOLUTION | RESPIRATORY_TRACT | 5 refills | Status: DC | PRN

## 2020-06-01 MED ORDER — LEVALBUTEROL TARTRATE 45 MCG/ACT IN AERO: 2.0000 | INHALATION_SPRAY | RESPIRATORY_TRACT | 2 refills | Status: DC | PRN

## 2020-06-01 MED ORDER — PROMETHAZINE-DM 6.25-15 MG/5ML PO SYRP: 2.5000 mL | ORAL_SOLUTION | Freq: Four times a day (QID) | ORAL | 0 refills | Status: DC | PRN

## 2020-06-01 NOTE — Telephone Encounter (Signed)
Chasity called and spoke with Fleet Contras she is laboring to breath and coughing. She is upset crying and says she can not leave the bathroom because when she coughs, she pees.  I spoke with Georgiann on the phone- she does not want to go anywhere- just wants to stay home. I told her to use her Xopenex + ipratropium in the neb every 4 hours. She is taking prednisone 10mg  daily- should she up the dose? what else should she do? she says all of this was brought on by being around a cat.   Per Dr. - Increase prednisone to 20mg  BID for three days, 10mg  BID for three days, then back to 10mg  daily. Refilled Promethazine cough syrup per Dr. Dellis Anes. Sent to [facility] [address]. Verneice also needed refills on Levalbuterol HFA and for neb.   Marchia was informed and okay with this plan. She wanted me to tell Dr. to have a merry Christmas!

## 2020-06-02 NOTE — Telephone Encounter (Signed)
Anne Rich called to let everyone know she is feeling better and she request a call back from a nurse

## 2020-06-03 ENCOUNTER — Other Ambulatory Visit: Payer: Self-pay | Admitting: Allergy & Immunology

## 2020-06-03 ENCOUNTER — Emergency Department (HOSPITAL_BASED_OUTPATIENT_CLINIC_OR_DEPARTMENT_OTHER)
Admission: EM | Admit: 2020-06-03 | Discharge: 2020-06-03 | Disposition: A | Discharge status: Home / Self Care | Payer: Medicare Other | Attending: Emergency Medicine | Admitting: Emergency Medicine

## 2020-06-03 ENCOUNTER — Emergency Department (HOSPITAL_BASED_OUTPATIENT_CLINIC_OR_DEPARTMENT_OTHER): Payer: Medicare Other

## 2020-06-03 ENCOUNTER — Encounter (HOSPITAL_BASED_OUTPATIENT_CLINIC_OR_DEPARTMENT_OTHER): Payer: Self-pay

## 2020-06-03 ENCOUNTER — Other Ambulatory Visit: Payer: Self-pay

## 2020-06-03 DIAGNOSIS — Z20822 Contact with and (suspected) exposure to covid-19: Secondary | ICD-10-CM | POA: Insufficient documentation

## 2020-06-03 DIAGNOSIS — J45901 Unspecified asthma with (acute) exacerbation: Secondary | ICD-10-CM | POA: Diagnosis not present

## 2020-06-03 DIAGNOSIS — I2584 Coronary atherosclerosis due to calcified coronary lesion: Secondary | ICD-10-CM | POA: Diagnosis not present

## 2020-06-03 DIAGNOSIS — Z79899 Other long term (current) drug therapy: Secondary | ICD-10-CM | POA: Diagnosis not present

## 2020-06-03 DIAGNOSIS — I25811 Atherosclerosis of native coronary artery of transplanted heart without angina pectoris: Secondary | ICD-10-CM | POA: Insufficient documentation

## 2020-06-03 DIAGNOSIS — R0602 Shortness of breath: Secondary | ICD-10-CM | POA: Diagnosis present

## 2020-06-03 DIAGNOSIS — Z8616 Personal history of COVID-19: Secondary | ICD-10-CM | POA: Insufficient documentation

## 2020-06-03 DIAGNOSIS — Z87891 Personal history of nicotine dependence: Secondary | ICD-10-CM | POA: Insufficient documentation

## 2020-06-03 DIAGNOSIS — I10 Essential (primary) hypertension: Secondary | ICD-10-CM | POA: Insufficient documentation

## 2020-06-03 LAB — RESP PANEL BY RT-PCR (FLU A&B, COVID) ARPGX2
Influenza A by PCR: NEGATIVE
Influenza B by PCR: NEGATIVE
SARS Coronavirus 2 by RT PCR: NEGATIVE

## 2020-06-03 MED ORDER — DEXAMETHASONE SODIUM PHOSPHATE 10 MG/ML IJ SOLN
8.0000 mg | Freq: Once | INTRAMUSCULAR | Status: AC
  Administered 2020-06-03: 8 mg via INTRAMUSCULAR
  Filled 2020-06-03: qty 1

## 2020-06-03 NOTE — ED Provider Notes (Signed)
[facility] EMERGENCY DEPARTMENT Provider Note   CSN: [id number] Arrival date & time: 06/03/20  1521     History Chief Complaint  Patient presents with  . Shortness of Breath    Anne Rich is a 69 y.o. female past medical history of COPD, asthma, polio CVA, presenting to the emergency department via EMS with complaint of shortness of breath and asthma exacerbation.  Patient states she opened the oven today and the heat caused her breathing to instantly worsen.  She treated with albuterol without significant relief therefore called EMS.  She states last week she began developing symptoms of a cold with sore throat.  On Friday she began having coughing and some shortness of breath with wheezing.  Her asthma specialist instructed she combine her ipratropium and albuterol in her nebulizer machine and use them at the same time which provided quite a bit of relief.  She states overall her cold symptoms have improved significantly.  She states her asthma has been very easily aggravated with simple things such as the heat from the other than in her face or fragrance when she walks past somebody.  She wears 3 L of oxygen supplementation at home with exertion, and at bedtime.  She was put on oxygen upon arrival to the ED, however on evaluation she is remove the nasal cannula and is satting at 97% on room air.  No fevers or chills at home, no Covid exposures.  She is vaccinated against Covid  The history is provided by the patient.       Past Medical History:  Diagnosis Date  . Allergic rhinitis   . Asthma   . COPD (chronic obstructive pulmonary disease) [facility](HCC)     Patient Active Problem List   Diagnosis Date Noted  . COVID-19 virus infection 06/20/2019  . Asthma with COPD with exacerbation (HCC) 06/20/2019  . Medication management 05/29/2019  . Severe persistent asthma without complication 03/16/2019  . Physical deconditioning 01/06/2019  . Counseling regarding end of life  decision making 01/06/2019  . Lumbosacral dysfunction 09/04/2018  . Chronic respiratory failure with hypoxia (HCC), 3L home O2 05/01/2018  . Statin intolerance 02/13/2018  . Moderate persistent asthma, uncomplicated 01/22/2018  . Adverse effect of other drugs, medicaments and biological substances, initial encounter 01/01/2018  . Restrictive airway disease 12/09/2017  . Bilateral carotid artery stenosis 10/14/2017  . Spells of decreased attentiveness 09/10/2017  . Cortical age-related cataract of left eye 10/24/2016  . Nuclear sclerotic cataract of left eye 10/24/2016  . History of epistaxis 07/19/2016  . Advance directive in chart 04/04/2016  . Idiopathic peripheral neuropathy 03/12/2016  . Loss of appetite 03/12/2016  . Psychophysiological insomnia 03/12/2016  . Oxygen dependent 02/28/2016  . Easy bruising 12/26/2015  . Gastroesophageal reflux disease 12/02/2015  . Coronary artery calcification seen on CT scan 09/20/2015  . 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HCC) 09/20/2015  . Abnormal weight loss 09/06/2015  . Herniated lumbar intervertebral disc 07/21/2015  . Migraine without aura and without status migrainosus, not intractable 07/21/2015  . Anxiety 06/28/2015  . Chronic constipation 06/28/2015  . Chronic use of benzodiazepine for therapeutic purpose 06/28/2015  . Former smoker 06/28/2015  . Hepatic steatosis 06/28/2015  . History of cerebrovascular accident (CVA) with residual deficit 06/28/2015  . 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 Hypoxemia 06/28/2015  . 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 Osteopenia 06/28/2015  . Unspecified convulsions (HCC) 06/28/2015  . Acute poliomyelitis, unspecified 04/18/2015  . CVA (cerebral vascular accident) (HCC) 04/18/2015  . Essential  hypertension 04/18/2015  . Other allergic rhinitis 02/10/2015  . 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 Hip pain 07/08/2013  . Melena 04/22/2013  . Hypercholesterolemia 02/12/2013  . Depressive  disorder 11/15/2012  . 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 Low back pain 06/18/2012  . 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 Right foot pain 06/18/2012  . Onychomycosis 03/21/2012  . Pain, chronic 07/13/2011  . Shortness of breath 02/13/2011    Past Surgical History:  Procedure Laterality Date  . ABDOMINAL HYSTERECTOMY    . APPENDECTOMY    . CESAREAN SECTION       OB History   No obstetric history on file.     Family History  Problem Relation Age of Onset  . Allergic rhinitis Neg Hx   . Angioedema Neg Hx   . Asthma Neg Hx   . Eczema Neg Hx   . Immunodeficiency Neg Hx   . Urticaria Neg Hx     Social History   Tobacco Use  . Smoking status: Former Smoker    Packs/day: 0.50    Years: 15.00    Pack years: 7.50    Types: Cigarettes    Quit date: 06/12/1995    Years since quitting: 24.9  . Smokeless tobacco: Never Used  Vaping Use  . Vaping Use: Never used  Substance Use Topics  . Alcohol use: No  . Drug use: No    Home Medications Prior to Admission medications   Medication Sig Start Date End Date Taking? Authorizing Provider  albuterol (VENTOLIN HFA) 108 (90 Base) MCG/ACT inhaler INHALE 2 PUFFS INTO THE LUNGS EVERY 4 HOURS AS NEEDED FOR WHEEZE OR FOR SHORTNESS OF BREATH 04/15/20   Mannam, Colbert Coyer, MD  amoxicillin (AMOXIL) 875 MG tablet Take 1 tablet (875 mg total) by mouth 2 (two) times daily for 7 days. 05/27/20 06/03/20  Alfonse Spruce, MD  arformoterol (BROVANA) 15 MCG/2ML NEBU Take 2 mLs (15 mcg total) by nebulization 2 (two) times daily. 06/08/19   Coral Ceo, NP  aspirin 81 MG tablet Take 81 mg by mouth daily.    [provider]  b complex vitamins capsule Take 1 capsule by mouth daily.    [provider]  benzonatate (TESSALON PERLES) 100 MG capsule Take 1 capsule (100 mg total) by mouth 3 (three) times daily as needed for cough. 05/25/20   Hetty Blend, FNP  benzonatate (TESSALON) 200 MG capsule Take 1  capsule (200 mg total) by mouth every 8 (eight) hours as needed for cough. 06/24/19   Alfonse Spruce, MD  budesonide (PULMICORT) 0.5 MG/2ML nebulizer solution USE 1 VIAL  IN  NEBULIZER TWICE  DAILY - rinse mouth after treatment 02/08/20   Alfonse Spruce, MD  carbamazepine (TEGRETOL) 200 MG tablet Take 200-400 mg by mouth 2 (two) times daily.  tab in the morning and  at night    [provider]  cyclobenzaprine (FLEXERIL) 5 MG tablet Take 5 mg by mouth daily.    [provider]  diclofenac sodium (VOLTAREN) 1 % GEL Apply 2 g topically 4 (four) times daily as needed (knee pain).  11/10/18   [provider]  dicyclomine (BENTYL) 10 MG capsule Take 10 mg by mouth 4 (four) times daily -  before meals and at bedtime.  04/28/17   [provider]  diphenoxylate-atropine (LOMOTIL) 2.5-0.025 MG tablet Take 1 tablet by mouth 4 (four) times daily as needed for diarrhea or loose stools. 06/18/19   [provider]  EPINEPHrine (EPIPEN 2-PAK) 0.3 mg/0.3 mL IJ SOAJ injection Inject 0.3 mg into the muscle Once PRN. 03/14/20   Alfonse Spruce, MD  estradiol (ESTRACE) 1 MG tablet Take 1 mg by mouth daily. 11/19/17   [provider]  fenofibrate micronized (LOFIBRA) 134 MG capsule Take 134 mg by mouth daily.  12/22/19   [provider]  gabapentin (NEURONTIN) 600 MG tablet Take 600-1,200 mg by mouth 2 (two) times daily. 600mg  in the morning and 1200mg  at bedtime 11/25/18   [provider]  hydrOXYzine (VISTARIL) 25 MG capsule Take 25 mg by mouth 2 (two) times daily.  03/30/19   [provider]  ipratropium (ATROVENT) 0.02 % nebulizer solution Take 2.5 mLs (0.5 mg total) by nebulization 4 (four) times daily. 01/25/20   04/01/19, MD  levalbuterol [facility] HFA) 45 MCG/ACT inhaler Inhale 2 puffs into the lungs every 4 (four) hours as needed for wheezing. 06/01/20   [facility], MD  levalbuterol 06/03/20)  0.63 MG/3ML nebulizer solution Take 3 mLs (0.63 mg total) by nebulization every 4 (four) hours as needed. Mix with Ipratropium. 06/01/20   Pauline Aus, MD  levalbuterol 06/03/20) 1.25 MG/3ML nebulizer solution Take 0.63 mg by nebulization every 6 (six) hours as needed for wheezing.     [provider]  levETIRAcetam (KEPPRA) 1000 MG tablet Take 1,000-2,000 mg by mouth 2 (two) times daily. 1000mg  in the morning and 2000mg  at night    [provider]  levocetirizine (XYZAL) 5 MG tablet TAKE 1 TABLET (5 MG TOTAL) BY MOUTH DAILY AS NEEDED FOR ALLERGIES (FOR RUNNY NOSE). 12/28/19   Pauline Aus, MD  lidocaine (LIDODERM) 5 % Place 1 patch onto the skin daily.  12/11/18   [provider]  lisinopril (PRINIVIL,ZESTRIL) 10 MG tablet Take 10 mg by mouth daily.  12/10/17   [provider]  LORazepam (ATIVAN) 1 MG tablet Take 1 mg by mouth 3 (three) times daily. 12/01/18   [provider]  meclizine (ANTIVERT) 25 MG tablet Take 25 mg by mouth 2 (two) times daily as needed for dizziness or nausea.  01/27/13   [provider]  Melatonin 10 MG TABS Take 10 mg by mouth daily.    [provider]  mirtazapine (REMERON) 30 MG tablet Take 30 mg by mouth every evening.  12/06/18   [provider]  montelukast (SINGULAIR) 10 MG tablet Take 1 tablet by mouth everyday at bedtime 03/14/20   12/03/18, MD  Multiple Vitamin (MULTIVITAMIN) tablet Take 1 tablet by mouth daily.     [provider]  omeprazole (PRILOSEC) 40 MG capsule Take 40 mg by mouth 2 (two) times a day.    [provider]  OXYGEN Inhale 3 L into the lungs at bedtime. 4 L when walking    [provider]  polyvinyl alcohol (LIQUIFILM TEARS) 1.4 % ophthalmic solution Place 1 drop into both eyes as needed for dry eyes.    [provider]  promethazine-dextromethorphan (PROMETHAZINE-DM) 6.25-15 MG/5ML syrup Take 2.5 mLs by mouth 4 (four)  times daily as needed for cough. 06/01/20   05/14/20, MD  Revefenacin (YUPELRI) 175 MCG/3ML SOLN Inhale 3 mLs into the lungs daily. 05/25/19   08-13-1990, DO  Roflumilast (DALIRESP) 250 MCG TABS Take 250 mcg by mouth daily for 28 days. SAMPLE PROVIDED - LOT 06/03/20 EXP 11/08/2021 06/08/19 07/06/19  [id number], NP  salmeterol (SEREVENT DISKUS) 50 MCG/DOSE  diskus inhaler Inhale 1 puff into the lungs 2 (two) times daily. 05/21/19   Steffanie Dunn, DO  sodium chloride (OCEAN) 0.65 % SOLN nasal spray Place 1 spray into both nostrils as needed for congestion.    [provider]    Allergies    Iodine, Iodine-131, Penicillins, Pineapple, Shellfish allergy, Molds & smuts, Pravastatin, Shellfish-derived products, and Iodinated diagnostic agents  Review of Systems   Review of Systems  All other systems reviewed and are negative.   Physical Exam Updated Vital Signs BP (!) 158/87   Pulse 90   Temp 98.2 F (36.8 C) (Oral)   Resp (!) 22   Ht 5\' 8"  (1.727 m)   Wt 66.7 kg   SpO2 100%   BMI 22.35 kg/m   Physical Exam Vitals and nursing note reviewed.  Constitutional:      General: She is not in acute distress.    Appearance: She is well-developed and well-nourished. She is not ill-appearing.  HENT:     Head: Normocephalic and atraumatic.     Mouth/Throat:     Mouth: Mucous membranes are moist.     Pharynx: Oropharynx is clear. No oropharyngeal exudate or posterior oropharyngeal erythema.  Eyes:     Conjunctiva/sclera: Conjunctivae normal.  Cardiovascular:     Rate and Rhythm: Normal rate and regular rhythm.  Pulmonary:     Effort: Pulmonary effort is normal.     Breath sounds: Wheezing present.     Comments: Speaking in full sentences with normal work of breathing.  O2 sat 97% on room air.  Expiratory wheezes bilaterally Abdominal:     Palpations: Abdomen is soft.  Skin:    General: Skin is warm.  Neurological:     Mental Status: She is alert.   Psychiatric:        Mood and Affect: Mood and affect normal.        Behavior: Behavior normal.     ED Results / Procedures / Treatments   Labs (all labs ordered are listed, but only abnormal results are displayed) Labs Reviewed  RESP PANEL BY RT-PCR (FLU A&B, COVID) [id number]    EKG EKG Interpretation  Date/Time:  Friday June 03 2020 15:36:08 EST Ventricular Rate:  97 PR Interval:  124 QRS Duration: 72 QT Interval:  348 QTC Calculation: 441 R Axis:   70 Text Interpretation: Sinus rhythm with occasional Premature ventricular complexes nonspecific T waves, seem improved since Jan 2021 Confirmed by Feb 2021 [phone]) on 06/03/2020 4:00:34 PM   Radiology DG Chest 2 View  Result Date: 06/03/2020 CLINICAL DATA:  Shortness of breath, COPD EXAM: CHEST - 2 VIEW.  Patient is rotated. COMPARISON:  Chest x-ray 04/15/2020, CT chest 06/20/2019. FINDINGS: The heart size and mediastinal contours are within normal limits. Hyperinflation of the lungs with flattening of the hemidiaphragms consistent with emphysematous changes. No focal consolidation. No pulmonary edema. No pleural effusion. No pneumothorax. 1.3 cm round density overlying the right lower lobe likely represents a nipple shadow. Left nipple shadow overlies a rib and the left chest wall. No acute osseous abnormality.  Old healed rib fractures. IMPRESSION: No active cardiopulmonary disease in a patient with emphysematous changes. Electronically Signed   By: 08/18/2019 M.D.   On: 06/03/2020 17:54    Procedures Procedures (including critical care time)  Medications Ordered in ED Medications  dexamethasone (DECADRON) injection 8 mg (8 mg Intramuscular Given 06/03/20 1846)    ED Course  I have reviewed the triage vital signs and the  nursing notes.  Pertinent labs & imaging results that were available during my care of the patient were reviewed by me and considered in my medical decision making (see chart for  details).    MDM Rules/Calculators/A&P                          Patient presenting with symptoms consistent with likely asthma exacerbation.  She had cold symptoms last week which have mostly resolved.  She states today when she opened the oven and heat rest her face she immediately felt chest tightness and shortness of breath.  Her symptoms did not improve with albuterol therefore she called EMS for transport to the ED.  Upon arrival she was placed on her baseline O2 supplementation via nasal cannula, however on evaluation she had removed her oxygen and was satting 97% on room air with normal work of breathing, speaking in full sentences.  She does have some wheezes bilaterally though overall is well-appearing and in no acute distress.  X-ray is negative for acute infiltrate.  Covid swab sent and is negative for Covid and influenza.  She is given a dose of Decadron here.  She has both ipratropium and albuterol nebulizer at home.  She also has additional inhalers as needed. States she is in the middle of a prednisone taper then will go back to 10mg  daily maintenance prednisone.  Return if worsening.  Discussed results, findings, treatment and follow up. Patient advised of return precautions. Patient verbalized understanding and agreed with plan.  Final Clinical Impression(s) / ED Diagnoses Final diagnoses:  Exacerbation of asthma, unspecified asthma severity, unspecified whether persistent    Rx / DC Orders ED Discharge Orders    None       Zinia Innocent, N, PA-C 06/03/20 06/05/20, MD 06/03/20 2141

## 2020-06-03 NOTE — Discharge Instructions (Addendum)
Continue your prednisone taper as directed.  Follow closely outpatient with your doctor. Use your breathing treatments at home as needed. Return to emergency department if your breathing treatments do not provide significant relief of your shortness of breath.

## 2020-06-03 NOTE — ED Triage Notes (Signed)
Pt arrives with c/o chronic SOB pt has COPD reports using her nebs today at home

## 2020-06-03 NOTE — ED Notes (Signed)
Discharge instructions discussed. Pt verbalized understanding. Attempting to find ride home in stable condition.

## 2020-06-03 NOTE — ED Notes (Signed)
When patient arrived back to the room, her SAT was 74%. She stated that she wears 3L at home and 4L when being exerted. Did not mention this to anyone in triage, and was fine at that time. Placed on 3LNC.

## 2020-06-13 ENCOUNTER — Ambulatory Visit: Payer: Self-pay | Admitting: Allergy & Immunology

## 2020-06-13 ENCOUNTER — Telehealth: Payer: Self-pay | Admitting: Allergy & Immunology

## 2020-06-13 NOTE — Telephone Encounter (Signed)
PT INFORMED of this and will call about antibodies

## 2020-06-13 NOTE — Telephone Encounter (Signed)
Unfortunately, the antiviral approved for treatment of COVID-19 is not available in the pharmacies yet.  I would recommend that she call to see if she will qualify for monoclonal antibodies for prophylaxis, although I think those are limited right now during the current surge.  It cannot hurt to call.  See number below.  [phone] (leave your name and they will call you back)  I would also recommend that she get tested 3 to 5 days after her last exposure to her boyfriend to see if she ends up testing positive for it.  Thankfully, she is fully vaccinated.  Malachi Bonds, MD [facility]

## 2020-06-13 NOTE — Telephone Encounter (Signed)
PT calling was exposed to boyfriend who was diagnosed with covid today. Close contact last night and last few days during holiday festivities. PT is requested pill to stop covid transmission. PT is going to get covid test.

## 2020-06-14 ENCOUNTER — Other Ambulatory Visit: Payer: Self-pay

## 2020-06-14 ENCOUNTER — Telehealth: Payer: Self-pay | Admitting: Pulmonary Disease

## 2020-06-14 ENCOUNTER — Encounter (HOSPITAL_COMMUNITY): Payer: Self-pay | Admitting: Emergency Medicine

## 2020-06-14 ENCOUNTER — Emergency Department (HOSPITAL_COMMUNITY)
Admission: EM | Admit: 2020-06-14 | Discharge: 2020-06-15 | Disposition: A | Discharge status: Home / Self Care | Payer: Medicare Other | Attending: Emergency Medicine | Admitting: Emergency Medicine

## 2020-06-14 DIAGNOSIS — R1013 Epigastric pain: Secondary | ICD-10-CM

## 2020-06-14 DIAGNOSIS — J4550 Severe persistent asthma, uncomplicated: Secondary | ICD-10-CM | POA: Insufficient documentation

## 2020-06-14 DIAGNOSIS — Z7982 Long term (current) use of aspirin: Secondary | ICD-10-CM | POA: Insufficient documentation

## 2020-06-14 DIAGNOSIS — Z20822 Contact with and (suspected) exposure to covid-19: Secondary | ICD-10-CM | POA: Diagnosis not present

## 2020-06-14 DIAGNOSIS — J441 Chronic obstructive pulmonary disease with (acute) exacerbation: Secondary | ICD-10-CM | POA: Diagnosis not present

## 2020-06-14 DIAGNOSIS — I10 Essential (primary) hypertension: Secondary | ICD-10-CM | POA: Insufficient documentation

## 2020-06-14 DIAGNOSIS — Z79899 Other long term (current) drug therapy: Secondary | ICD-10-CM | POA: Insufficient documentation

## 2020-06-14 DIAGNOSIS — Z87891 Personal history of nicotine dependence: Secondary | ICD-10-CM | POA: Diagnosis not present

## 2020-06-14 DIAGNOSIS — R195 Other fecal abnormalities: Secondary | ICD-10-CM

## 2020-06-14 DIAGNOSIS — K921 Melena: Secondary | ICD-10-CM | POA: Diagnosis present

## 2020-06-14 LAB — COMPREHENSIVE METABOLIC PANEL
ALT: 18 U/L (ref 0–44)
AST: 20 U/L (ref 15–41)
Albumin: 3.5 g/dL (ref 3.5–5.0)
Alkaline Phosphatase: 57 U/L (ref 38–126)
Anion gap: 9 (ref 5–15)
BUN: 6 mg/dL — ABNORMAL LOW (ref 8–23)
CO2: 28 mmol/L (ref 22–32)
Calcium: 9.1 mg/dL (ref 8.9–10.3)
Chloride: 97 mmol/L — ABNORMAL LOW (ref 98–111)
Creatinine, Ser: 0.63 mg/dL (ref 0.44–1.00)
GFR, Estimated: >60 mL/min (ref >60)
Glucose, Bld: 97 mg/dL (ref 70–99)
Potassium: 4.3 mmol/L (ref 3.5–5.1)
Sodium: 134 mmol/L — ABNORMAL LOW (ref 135–145)
Total Bilirubin: 0.4 mg/dL (ref 0.3–1.2)
Total Protein: 6.1 g/dL — ABNORMAL LOW (ref 6.5–8.1)

## 2020-06-14 LAB — RESP PANEL BY RT-PCR (RSV, FLU A&B, COVID)  RVPGX2
Influenza A by PCR: NEGATIVE
Influenza B by PCR: NEGATIVE
Resp Syncytial Virus by PCR: NEGATIVE
SARS Coronavirus 2 by RT PCR: NEGATIVE

## 2020-06-14 LAB — CBC
HCT: 39.1 % (ref 36.0–46.0)
Hemoglobin: 12.4 g/dL (ref 12.0–15.0)
MCH: 30.2 pg (ref 26.0–34.0)
MCHC: 31.7 g/dL (ref 30.0–36.0)
MCV: 95.4 fL (ref 80.0–100.0)
Platelets: 426 10*3/uL — ABNORMAL HIGH (ref 150–400)
RBC: 4.1 MIL/uL (ref 3.87–5.11)
RDW: 12.7 % (ref 11.5–15.5)
WBC: 9.4 10*3/uL (ref 4.0–10.5)
nRBC: 0 % (ref 0.0–0.2)

## 2020-06-14 LAB — TYPE AND SCREEN
ABO/RH(D): O POS
Antibody Screen: NEGATIVE

## 2020-06-14 NOTE — ED Triage Notes (Signed)
To ED via [facility] from [facility]--- recently had a friend pass from covid at nursing home---, had covid in January-  Started having dark stools on Sunday night. Had one today.

## 2020-06-14 NOTE — Telephone Encounter (Signed)
Spoke with pt, aware of recs.  Nothing further needed at this time- will close encounter.   

## 2020-06-14 NOTE — Telephone Encounter (Signed)
Agree that she needs to get tested.  We cannot offer any outpatient therapies unless we have confirmation of COVID-19 infection.

## 2020-06-14 NOTE — Telephone Encounter (Signed)
Primary Pulmonologist: Mannam Last office visit and with whom: 04/15/20 with Dr. Isaiah Serge What do we see them for (pulmonary problems): Asthma-COPD overlap   Patient called she was around someone who tested positive. She was around the person Saturday and Sunday. Person tested positive yesterday. Patient is having a cough and really tired this morning. She said she doesn't nap and she wanted to sleep all day yesterday.   She wants to know what she should do. I told her she needed to get tested herself and see if she was positive.  Dr. Isaiah Serge please advise.   Allergies  Allergen Reactions  . Iodine Itching, Rash and Swelling    IV contrast   . Iodine-131 Itching and Swelling    Pt states causes severe itching Pt states causes severe itching   . Penicillins Hives and Swelling    Swelling of the throat  . Pineapple Other (See Comments) and Swelling    Pt states causes her throat swelling Pt states causes her throat swelling   . Shellfish Allergy Rash and Swelling    Had reaction to cardiac cath dye  Had seizure ,rash ,itch Oct. 2012 Had reaction to cardiac cath dye  Had seizure ,rash ,itch Oct. 2012  Seizure during Cardiac Cath 2012  . 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 Pravastatin Other (See Comments)    Pt states she couldn't lift her legs to walk  . Shellfish-Derived Products   . Iodinated Diagnostic Agents Rash    Immunization History  Administered Date(s) Administered  . Fluad Quad(high Dose 65+) 02/19/2019  . Influenza, High Dose Seasonal PF 03/15/2016, 03/25/2017, 03/06/2018, 02/19/2019  . Janssen (J&J) SARS-COV-2 Vaccination 09/21/2019, 05/02/2020  . Pneumococcal Conjugate-13 04/16/2014  . Pneumococcal Polysaccharide-23 01/29/2011, 06/28/2015  . Tdap 08/07/2014

## 2020-06-15 ENCOUNTER — Other Ambulatory Visit: Payer: Self-pay

## 2020-06-15 DIAGNOSIS — K921 Melena: Secondary | ICD-10-CM | POA: Diagnosis not present

## 2020-06-15 LAB — HEMOGLOBIN AND HEMATOCRIT, BLOOD
HCT: 39.4 % (ref 36.0–46.0)
Hemoglobin: 13.2 g/dL (ref 12.0–15.0)

## 2020-06-15 LAB — POC OCCULT BLOOD, ED: Fecal Occult Bld: NEGATIVE

## 2020-06-15 LAB — ABO/RH: ABO/RH(D): O POS

## 2020-06-15 MED ORDER — PANTOPRAZOLE SODIUM 40 MG PO TBEC
40.0000 mg | DELAYED_RELEASE_TABLET | Freq: Once | ORAL | Status: AC
  Administered 2020-06-15: 40 mg via ORAL
  Filled 2020-06-15: qty 1

## 2020-06-15 MED ORDER — LIDOCAINE VISCOUS HCL 2 % MT SOLN
15.0000 mL | Freq: Once | OROMUCOSAL | Status: AC
  Administered 2020-06-15: 15 mL via ORAL
  Filled 2020-06-15: qty 15

## 2020-06-15 MED ORDER — YUPELRI 175 MCG/3ML IN SOLN: 175.0000 ug | Freq: Every day | RESPIRATORY_TRACT | 11 refills | Status: DC

## 2020-06-15 MED ORDER — ALUM & MAG HYDROXIDE-SIMETH 200-200-20 MG/5ML PO SUSP
30.0000 mL | Freq: Once | ORAL | Status: AC
  Administered 2020-06-15: 30 mL via ORAL
  Filled 2020-06-15: qty 30

## 2020-06-15 NOTE — Discharge Instructions (Addendum)
Today for abdominal pain and dark stools.  Make sure that you are taking your omeprazole at home.  Follow-up with your GI doctor.  At this time there is no evidence of active or ongoing bleeding.  You also tested negative for COVID-19.

## 2020-06-15 NOTE — ED Notes (Signed)
Pt verbalized understanding. Pt called for a ride. Pt was taking out into the lobby in good condition.

## 2020-06-15 NOTE — ED Provider Notes (Signed)
[facility] EMERGENCY DEPARTMENT Provider Note   CSN: [id number] Arrival date & time: 06/14/20  1321     History Chief Complaint  Patient presents with  . GI Bleeding  . Fever    Anne Rich is a 71 y.o. female.  HPI     This is a 70 year old female with a history of COPD, reflux who presents with dark stools.  Patient reports she started having dark stools on Sunday night.  She has had multiple dark tarry stools.  She has not noted any gross blood.  No vomiting or hematemesis.  She does report some burning epigastric pain.  She reports that she is on 2 reflux medications.  She is not had any fevers.  She does report that she may have recently been exposed to someone with Covid.  Denies dizziness.  She has a GI doctor at [facility] and was encouraged to be evaluated.  She states that she has not been able to have any recent procedures because she is so high risk from a respiratory standpoint.  Patient is not on any blood thinners.  Past Medical History:  Diagnosis Date  . Allergic rhinitis   . Asthma   . COPD (chronic obstructive pulmonary disease) [facility])     Patient Active Problem List   Diagnosis Date Noted  . COVID-19 virus infection 06/20/2019  . Asthma with COPD with exacerbation (HCC) 06/20/2019  . Medication management 05/29/2019  . Severe persistent asthma without complication 03/16/2019  . Physical deconditioning 01/06/2019  . Counseling regarding end of life decision making 01/06/2019  . Lumbosacral dysfunction 09/04/2018  . Chronic respiratory failure with hypoxia (HCC), 3L home O2 05/01/2018  . Statin intolerance 02/13/2018  . Moderate persistent asthma, uncomplicated 01/22/2018  . Adverse effect of other drugs, medicaments and biological substances, initial encounter 01/01/2018  . Restrictive airway disease 12/09/2017  . Bilateral carotid artery stenosis 10/14/2017  . Spells of decreased attentiveness 09/10/2017  . Cortical age-related  cataract of left eye 10/24/2016  . Nuclear sclerotic cataract of left eye 10/24/2016  . History of epistaxis 07/19/2016  . Advance directive in chart 04/04/2016  . Idiopathic peripheral neuropathy 03/12/2016  . Loss of appetite 03/12/2016  . Psychophysiological insomnia 03/12/2016  . Oxygen dependent 02/28/2016  . Easy bruising 12/26/2015  . Gastroesophageal reflux disease 12/02/2015  . Coronary artery calcification seen on CT scan 09/20/2015  . 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HCC) 09/20/2015  . Abnormal weight loss 09/06/2015  . Herniated lumbar intervertebral disc 07/21/2015  . Migraine without aura and without status migrainosus, not intractable 07/21/2015  . Anxiety 06/28/2015  . Chronic constipation 06/28/2015  . Chronic use of benzodiazepine for therapeutic purpose 06/28/2015  . Former smoker 06/28/2015  . Hepatic steatosis 06/28/2015  . History of cerebrovascular accident (CVA) with residual deficit 06/28/2015  . 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 Hypoxemia 06/28/2015  . 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 Osteopenia 06/28/2015  . Unspecified convulsions (HCC) 06/28/2015  . Acute poliomyelitis, unspecified 04/18/2015  . CVA (cerebral vascular accident) (HCC) 04/18/2015  . Essential hypertension 04/18/2015  . Other allergic rhinitis 02/10/2015  . 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 Hip pain 07/08/2013  . Melena 04/22/2013  . Hypercholesterolemia 02/12/2013  . Depressive disorder 11/15/2012  . 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 Low back pain 06/18/2012  . 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 Right foot pain 06/18/2012  . Onychomycosis 03/21/2012  . Pain, chronic 07/13/2011  . Shortness of breath 02/13/2011  Past Surgical History:  Procedure Laterality Date  . ABDOMINAL HYSTERECTOMY    . APPENDECTOMY    . CESAREAN SECTION       OB History   No obstetric history on file.     Family History  Problem Relation Age of Onset   . Allergic rhinitis Neg Hx   . Angioedema Neg Hx   . Asthma Neg Hx   . Eczema Neg Hx   . Immunodeficiency Neg Hx   . Urticaria Neg Hx     Social History   Tobacco Use  . Smoking status: Former Smoker    Packs/day: 0.50    Years: 15.00    Pack years: 7.50    Types: Cigarettes    Quit date: 06/12/1995    Years since quitting: 25.0  . Smokeless tobacco: Never Used  Vaping Use  . Vaping Use: Never used  Substance Use Topics  . Alcohol use: No  . Drug use: No    Home Medications Prior to Admission medications   Medication Sig Start Date End Date Taking? Authorizing Provider  albuterol (VENTOLIN HFA) 108 (90 Base) MCG/ACT inhaler INHALE 2 PUFFS INTO THE LUNGS EVERY 4 HOURS AS NEEDED FOR WHEEZE OR FOR SHORTNESS OF BREATH 04/15/20   Mannam, Praveen, MD  arformoterol (BROVANA) 15 MCG/2ML NEBU Take 2 mLs (15 mcg total) by nebulization 2 (two) times daily. 06/08/19   Coral Ceo, NP  aspirin 81 MG tablet Take 81 mg by mouth daily.    [provider]  b complex vitamins capsule Take 1 capsule by mouth daily.    [provider]  benzonatate (TESSALON PERLES) 100 MG capsule Take 1 capsule (100 mg total) by mouth 3 (three) times daily as needed for cough. 05/25/20   Hetty Blend, FNP  benzonatate (TESSALON) 200 MG capsule Take 1 capsule (200 mg total) by mouth every 8 (eight) hours as needed for cough. 06/24/19   Alfonse Spruce, MD  budesonide (PULMICORT) 0.5 MG/2ML nebulizer solution USE 1 VIAL  IN  NEBULIZER TWICE  DAILY - rinse mouth after treatment 02/08/20   Alfonse Spruce, MD  carbamazepine (TEGRETOL) 200 MG tablet Take 200-400 mg by mouth 2 (two) times daily. 200mg  tab in the morning and 400mg  at night    [provider]  cyclobenzaprine (FLEXERIL) 5 MG tablet Take 5 mg by mouth daily.    [provider]  diclofenac sodium (VOLTAREN) 1 % GEL Apply 2 g topically 4 (four) times daily as needed (knee pain).  11/10/18   [provider]   dicyclomine (BENTYL) 10 MG capsule Take 10 mg by mouth 4 (four) times daily -  before meals and at bedtime.  04/28/17   [provider]  diphenoxylate-atropine (LOMOTIL) 2.5-0.025 MG tablet Take 1 tablet by mouth 4 (four) times daily as needed for diarrhea or loose stools. 06/18/19   [provider]  EPINEPHrine (EPIPEN 2-PAK) 0.3 mg/0.3 mL IJ SOAJ injection Inject 0.3 mg into the muscle Once PRN. 03/14/20   08/16/19, MD  estradiol (ESTRACE) 1 MG tablet Take 1 mg by mouth daily. 11/19/17   [provider]  fenofibrate micronized (LOFIBRA) 134 MG capsule Take 134 mg by mouth daily.  12/22/19   [provider]  gabapentin (NEURONTIN) 600 MG tablet Take 600-1,200 mg by mouth 2 (two) times daily. 600mg  in the morning and 1200mg  at bedtime 11/25/18   [provider]  hydrOXYzine (VISTARIL) 25 MG capsule Take 25 mg by mouth  2 (two) times daily.  03/30/19   [provider]  ipratropium (ATROVENT) 0.02 % nebulizer solution TAKE 2.5 MLS (0.5 MG TOTAL) BY NEBULIZATION 4 (FOUR) TIMES DAILY. 06/07/20   Alfonse Spruce, MD  levalbuterol [facility] HFA) 45 MCG/ACT inhaler Inhale 2 puffs into the lungs every 4 (four) hours as needed for wheezing. 06/01/20   Alfonse Spruce, MD  levalbuterol Pauline Aus) 0.63 MG/3ML nebulizer solution Take 3 mLs (0.63 mg total) by nebulization every 4 (four) hours as needed. Mix with Ipratropium. 06/01/20   Alfonse Spruce, MD  levalbuterol Pauline Aus) 1.25 MG/3ML nebulizer solution Take 0.63 mg by nebulization every 6 (six) hours as needed for wheezing.     [provider]  levETIRAcetam (KEPPRA) 1000 MG tablet Take 1,000-2,000 mg by mouth 2 (two) times daily. 1000mg  in the morning and 2000mg  at night    [provider]  levocetirizine (XYZAL) 5 MG tablet TAKE 1 TABLET (5 MG TOTAL) BY MOUTH DAILY AS NEEDED FOR ALLERGIES (FOR RUNNY NOSE). 12/28/19   , MD  lidocaine (LIDODERM)  5 % Place 1 patch onto the skin daily.  12/11/18   [provider]  lisinopril (PRINIVIL,ZESTRIL) 10 MG tablet Take 10 mg by mouth daily.  12/10/17   [provider]  LORazepam (ATIVAN) 1 MG tablet Take 1 mg by mouth 3 (three) times daily. 12/01/18   [provider]  meclizine (ANTIVERT) 25 MG tablet Take 25 mg by mouth 2 (two) times daily as needed for dizziness or nausea.  01/27/13   [provider]  Melatonin 10 MG TABS Take 10 mg by mouth daily.    [provider]  mirtazapine (REMERON) 30 MG tablet Take 30 mg by mouth every evening.  12/06/18   [provider]  montelukast (SINGULAIR) 10 MG tablet Take 1 tablet by mouth everyday at bedtime 03/14/20   12/08/18, MD  Multiple Vitamin (MULTIVITAMIN) tablet Take 1 tablet by mouth daily.     [provider]  omeprazole (PRILOSEC) 40 MG capsule Take 40 mg by mouth 2 (two) times a day.    [provider]  OXYGEN Inhale 3 L into the lungs at bedtime. 4 L when walking    [provider]  polyvinyl alcohol (LIQUIFILM TEARS) 1.4 % ophthalmic solution Place 1 drop into both eyes as needed for dry eyes.    [provider]  promethazine-dextromethorphan (PROMETHAZINE-DM) 6.25-15 MG/5ML syrup Take 2.5 mLs by mouth 4 (four) times daily as needed for cough. 06/01/20   08-13[birth year]1990, MD  Revefenacin (YUPELRI) 175 MCG/3ML SOLN Inhale 3 mLs into the lungs daily. 05/25/19   Alfonse Spruce, DO  Roflumilast (DALIRESP) 250 MCG TABS Take 250 mcg by mouth daily for 28 days. SAMPLE PROVIDED - LOT 05/27/19 EXP 11/08/2021 06/08/19 07/06/19  06/10/19, NP  salmeterol (SEREVENT DISKUS) 50 MCG/DOSE diskus inhaler Inhale 1 puff into the lungs 2 (two) times daily. 05/21/19   Coral Ceo, DO  sodium chloride (OCEAN) 0.65 % SOLN nasal spray Place 1 spray into both nostrils as needed for congestion.    [provider]    Allergies    Iodine, Iodine-131,  Penicillins, Pineapple, Shellfish allergy, Molds & smuts, Pravastatin, Shellfish-derived products, and Iodinated diagnostic agents  Review of Systems   Review of Systems  Constitutional: Negative for fever.  Respiratory: Negative for shortness of breath.   Cardiovascular: Negative for chest pain.  Gastrointestinal: Positive for abdominal pain. Negative for diarrhea, nausea  and vomiting.       Dark stools  Genitourinary: Negative for dysuria.  All other systems reviewed and are negative.   Physical Exam Updated Vital Signs BP (!) 152/77 (BP Location: Right Arm)   Pulse 73   Temp 97.8 F (36.6 C) (Oral)   Resp 18   Ht 1.727 m (5\' 8" )   Wt 65.8 kg   SpO2 96%   BMI 22.05 kg/m   Physical Exam Vitals and nursing note reviewed.  Constitutional:      Appearance: She is well-developed and well-nourished. She is not ill-appearing.  HENT:     Head: Normocephalic and atraumatic.     Nose: Nose normal.     Mouth/Throat:     Mouth: Mucous membranes are moist.  Eyes:     Pupils: Pupils are equal, round, and reactive to light.  Cardiovascular:     Rate and Rhythm: Normal rate and regular rhythm.     Heart sounds: Normal heart sounds.  Pulmonary:     Effort: Pulmonary effort is normal. No respiratory distress.     Comments: Occasional wheeze, no acute distress Abdominal:     General: Bowel sounds are normal.     Palpations: Abdomen is soft.     Tenderness: There is abdominal tenderness.     Comments: Epigastric tenderness to palpation  Genitourinary:    Comments: Dark brown stool noted on rectal exam Musculoskeletal:     Cervical back: Neck supple.     Right lower leg: No edema.     Left lower leg: No edema.  Skin:    General: Skin is warm and dry.  Neurological:     Mental Status: She is alert and oriented to person, place, and time.  Psychiatric:        Mood and Affect: Mood and affect normal.     Comments: Anxious appearing     ED Results / Procedures / Treatments    Labs (all labs ordered are listed, but only abnormal results are displayed) Labs Reviewed  COMPREHENSIVE METABOLIC PANEL - Abnormal; Notable for the following components:      Result Value   Sodium 134 (*)    Chloride 97 (*)    BUN 6 (*)    Total Protein 6.1 (*)    All other components within normal limits  CBC - Abnormal; Notable for the following components:   Platelets 426 (*)    All other components within normal limits  RESP PANEL BY RT-PCR (RSV, FLU A&B, COVID)  [id number]  HEMOGLOBIN AND HEMATOCRIT, BLOOD  POC OCCULT BLOOD, ED  TYPE AND SCREEN  ABO/RH    EKG None  Radiology No results found.  Procedures Procedures (including critical care time)  Medications Ordered in ED Medications  alum & mag hydroxide-simeth (MAALOX/MYLANTA) 200-200-20 MG/5ML suspension 30 mL (30 mLs Oral Given 06/15/20 0319)    And  lidocaine (XYLOCAINE) 2 % viscous mouth solution 15 mL (15 mLs Oral Given 06/15/20 0319)  pantoprazole (PROTONIX) EC tablet 40 mg (40 mg Oral Given 06/15/20 0319)    ED Course  I have reviewed the triage vital signs and the nursing notes.  Pertinent labs & imaging results that were available during my care of the patient were reviewed by me and considered in my medical decision making (see chart for details).    MDM Rules/Calculators/A&P                          Patient  presents with epigastric pain and reported dark stools.  She is overall nontoxic and vital signs are reassuring.  She does have a history of reflux and is on omeprazole.  Other considerations include upper GI bleed, peptic ulcer, pancreatitis.  Patient has dark brown stool on rectal exam that is heme negative.  No active bleeding noted.  Initial hemoglobin is reassuring at 12.4.  Patient was given a GI cocktail and Protonix.  She takes omeprazole daily.  On recheck, she states she feels somewhat better.  She also reports having had a Covid exposure.  Covid testing is negative.  Repeat hemoglobin greater  than 12 hours after initial is stable at 13.2.   Patient's presentation not consistent with an active GI bleed.  Do recommend follow-up with her GI doctor for adjustments in PPI.  She is on 20 of omeprazole twice daily.  Patient reassured.  After history, exam, and medical workup I feel the patient has been appropriately medically screened and is safe for discharge home. Pertinent diagnoses were discussed with the patient. Patient was given return precautions.  Chizara Mena was evaluated in Emergency Department on 06/15/2020 for the symptoms described in the history of present illness. She was evaluated in the context of the global COVID-19 pandemic, which necessitated consideration that the patient might be at risk for infection with the SARS-CoV-2 virus that causes COVID-19. Institutional protocols and algorithms that pertain to the evaluation of patients at risk for COVID-19 are in a state of rapid change based on information released by regulatory bodies including the CDC and federal and state organizations. These policies and algorithms were followed during the patient's care in the ED.    Final Clinical Impression(s) / ED Diagnoses Final diagnoses:  Epigastric pain  Dark stools  Exposure to COVID-19 virus    Rx / DC Orders ED Discharge Orders    None       Tenita Cue, Mayer Masker, MD 06/15/20 [phone]

## 2020-06-20 ENCOUNTER — Other Ambulatory Visit: Payer: Self-pay | Admitting: Allergy & Immunology

## 2020-06-21 ENCOUNTER — Telehealth: Payer: Self-pay | Admitting: Pulmonary Disease

## 2020-06-21 ENCOUNTER — Ambulatory Visit: Payer: Medicare Other

## 2020-06-21 NOTE — Telephone Encounter (Signed)
Called and spoke with patient who states that pharmacy has sent over a SM form and that they are waiting to get that form back before they will ship the patients medication. Form has been located and is in Dr. Daneil Dan sign folder will route to his as FYI and his nurse

## 2020-06-23 ENCOUNTER — Other Ambulatory Visit: Payer: Self-pay | Admitting: Critical Care Medicine

## 2020-06-23 ENCOUNTER — Other Ambulatory Visit: Payer: Self-pay | Admitting: Allergy & Immunology

## 2020-06-24 ENCOUNTER — Telehealth: Payer: Self-pay

## 2020-06-24 ENCOUNTER — Other Ambulatory Visit: Payer: Self-pay

## 2020-06-24 DIAGNOSIS — J449 Chronic obstructive pulmonary disease, unspecified: Secondary | ICD-10-CM

## 2020-06-24 MED ORDER — YUPELRI 175 MCG/3ML IN SOLN: 175.0000 ug | Freq: Every day | RESPIRATORY_TRACT | 11 refills | Status: DC

## 2020-06-24 MED ORDER — ARFORMOTEROL TARTRATE 15 MCG/2ML IN NEBU: 15.0000 ug | INHALATION_SOLUTION | Freq: Two times a day (BID) | RESPIRATORY_TRACT | 11 refills | Status: DC

## 2020-06-24 MED ORDER — BUDESONIDE 0.5 MG/2ML IN SUSP: RESPIRATORY_TRACT | 3 refills | Status: DC

## 2020-06-24 NOTE — Telephone Encounter (Signed)
Sent in rx to [facility] per patients request. Informed pt of me doing so and she stated understanding

## 2020-06-24 NOTE — Telephone Encounter (Signed)
Pt was trying to get dr Isaiah Serge to send in refills for yupliri brovana and budesonide 0.5 is it ok if we send it in under you and you cosign? She has 3 doses of yupliri left

## 2020-06-24 NOTE — Telephone Encounter (Signed)
Sure that is fine with me!   Malachi Bonds, MD [facility]

## 2020-06-24 NOTE — Telephone Encounter (Signed)
Form in Dr. Shirlee More sign folder given to Dr. Isaiah Serge this morning.  Amy, CMA is working with him today and was asked to add update if she faxed anything.

## 2020-06-29 NOTE — Telephone Encounter (Signed)
Reviewed patient's chart, all of her nebulizer solutions were ordered by Dr. Ellouise Newer office.   Called patient but she did not answer. Left a message for her to call back. Will route message for Dr. Ellouise Newer office to handle.

## 2020-06-29 NOTE — Telephone Encounter (Signed)
We can send in the prescriptions for the nebulizers that she needs.  Please clarify from her if she gets them from this office from Dr. Dellis Anes?

## 2020-06-29 NOTE — Telephone Encounter (Signed)
Patient states a RX was sent for levalbuterol 0.63 but this was only for 1 month needed to be three, also patient stated her refill on budesonide was never received by the pharmacy seen sent and received to pharmacy on 06-24-2020 please advise

## 2020-07-05 DIAGNOSIS — J4550 Severe persistent asthma, uncomplicated: Secondary | ICD-10-CM | POA: Diagnosis not present

## 2020-07-06 ENCOUNTER — Ambulatory Visit (INDEPENDENT_AMBULATORY_CARE_PROVIDER_SITE_OTHER): Payer: Medicare Other

## 2020-07-06 ENCOUNTER — Other Ambulatory Visit: Payer: Self-pay

## 2020-07-06 DIAGNOSIS — J4550 Severe persistent asthma, uncomplicated: Secondary | ICD-10-CM | POA: Diagnosis not present

## 2020-07-11 ENCOUNTER — Encounter: Payer: Self-pay | Admitting: Allergy & Immunology

## 2020-07-11 ENCOUNTER — Other Ambulatory Visit: Payer: Self-pay

## 2020-07-11 ENCOUNTER — Ambulatory Visit (INDEPENDENT_AMBULATORY_CARE_PROVIDER_SITE_OTHER): Payer: Medicare Other | Admitting: Allergy & Immunology

## 2020-07-11 VITALS — HR 93 | Temp 98.3°F | Resp 24 | Ht 68.0 in | Wt 140.0 lb

## 2020-07-11 DIAGNOSIS — K219 Gastro-esophageal reflux disease without esophagitis: Secondary | ICD-10-CM | POA: Diagnosis not present

## 2020-07-11 DIAGNOSIS — J3089 Other allergic rhinitis: Secondary | ICD-10-CM

## 2020-07-11 DIAGNOSIS — Z9981 Dependence on supplemental oxygen: Secondary | ICD-10-CM

## 2020-07-11 DIAGNOSIS — J449 Chronic obstructive pulmonary disease, unspecified: Secondary | ICD-10-CM

## 2020-07-11 MED ORDER — LEVALBUTEROL HCL 0.63 MG/3ML IN NEBU: 0.6300 mg | INHALATION_SOLUTION | RESPIRATORY_TRACT | 1 refills | Status: DC | PRN

## 2020-07-11 MED ORDER — ALBUTEROL SULFATE HFA 108 (90 BASE) MCG/ACT IN AERS: 2.0000 | INHALATION_SPRAY | RESPIRATORY_TRACT | 5 refills | Status: DC | PRN

## 2020-07-11 MED ORDER — IPRATROPIUM BROMIDE 0.02 % IN SOLN: 0.5000 mg | Freq: Four times a day (QID) | RESPIRATORY_TRACT | 1 refills | Status: DC

## 2020-07-11 NOTE — Progress Notes (Signed)
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 Moderate persistent asthma with COPD overlap - Lung testing not done today.   - Continue with oxygen as needed.  - We are not going to make any changes at all today.  - Daily controller medication(s): prednisone 94m daily + Pulmicort (budesonide) + Brovana twice daily via nebulizer + Yupelri once daily via nebulizer + albuterol twice daily via nebulizer + Dalisrep 5024m daily + Xolair every two weeks  - Rescue medications: Xopenex (levalbuterol) + Atrovent (ipratropium) mixed together every 4-6 hours as needed via nebulizer - Asthma control goals:  * Full participation in all desired activities (may need albuterol before activity) * Albuterol use two time or less a week on average (not counting use with activity) * Cough interfering with sleep two time or less a month * Oral steroids no more than once a year * No hospitalizations  2. Allergic rhinitis - did allergy shots for three years - Continue with montelukast 1029maily.   3. Return in about 3 months (around 10/08/2020).   Subjective:   Anne Rich a 69 61o. female presenting today for follow up of  Chief Complaint  Patient presents with  . Asthma    Anne Roskelleys a history of the following: Patient Active Problem List   Diagnosis Date Noted  . COVID-19 virus infection 06/20/2019  . Asthma with COPD with exacerbation (HCC[place]1/02/2020  . Medication management 05/29/2019  . Severe persistent asthma without complication 04/03/45/2863 Physical deconditioning 01/06/2019  . Counseling regarding end  of life decision making 01/06/2019  . Lumbosacral dysfunction 09/04/2018  . Chronic respiratory failure with hypoxia ([place]CC[place]3L home O2 05/01/2018  . Statin intolerance 02/13/2018  . Moderate persistent asthma, uncomplicated 08/81/77/1165 Adverse effect of other drugs, medicaments and biological substances, initial encounter 01/01/2018  . Restrictive airway disease 12/09/2017  . Bilateral carotid artery stenosis 10/14/2017  . Spells of decreased attentiveness 09/10/2017  . Cortical age-related cataract of left eye 10/24/2016  . Nuclear sclerotic cataract of left eye 10/24/2016  . History of epistaxis 07/19/2016  . Advance directive in chart 04/04/2016  . Idiopathic peripheral neuropathy 03/12/2016  . Loss of appetite 03/12/2016  . Psychophysiological insomnia 03/12/2016  . Oxygen dependent 02/28/2016  . Easy bruising 12/26/2015  . Gastroesophageal reflux disease 12/02/2015  . Coronary artery calcification seen on CT scan 09/20/2015  . 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HCC[place]4/04/2016  . Abnormal weight loss 09/06/2015  . Herniated lumbar intervertebral disc 07/21/2015  . Migraine without aura and without status migrainosus, not intractable 07/21/2015  . Anxiety 06/28/2015  . Chronic constipation 06/28/2015  . Chronic use of benzodiazepine for therapeutic purpose 06/28/2015  . Former smoker 06/28/2015  . Hepatic steatosis 06/28/2015  . History of cerebrovascular accident (CVA) with residual deficit 06/28/2015  . 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 Hypoxemia 06/28/2015  . 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 Osteopenia 06/28/2015  . Unspecified convulsions (HCC[place]1/17/2017  . Acute poliomyelitis, unspecified 04/18/2015  . CVA (cerebral vascular accident) (HCCFor[place]1/12/2014  . Essential hypertension 04/18/2015  . Other allergic rhinitis 02/10/2015  . Asthma-COPD overlap syndrome (HCCWaukena9/06/2014  . Hip pain 07/08/2013  .  Melena 04/22/2013  . Hypercholesterolemia 02/12/2013  .  Depressive disorder 11/15/2012  . 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 Low back pain 06/18/2012  . 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 Right foot pain 06/18/2012  . Onychomycosis 03/21/2012  . Pain, chronic 07/13/2011  . Shortness of breath 02/13/2011    History obtained from: chart review and patient.  Anne Rich is a 70 y.o. female presenting for a follow up visit.  She was last seen in October 2021.  At that time, her lung testing looks slightly better.  We continue with oxygen as needed and referred her to see pulmonology.  We continued with prednisone 10 mg daily as well as Pulmicort and Brovana twice a day via nebulizer in combination with Yupelri daily via nebulizer.  She was also on Dalisrep 500 mcg daily and Xolair every 2 weeks.  In the interim, we did send in amoxicillin twice a day for 7 days in the middle of December for sinusitis.  She did call us in early January and a close contact was diagnosed with COVID-19.  She was wondering about an antiviral treatment.  Since the last visit, she has mostly done well. She did hit her head this morning when she was hanging drapes. She got her cane and used this to pull her drapes. It came back and hit her in the head. This happened this morning. She denies any LOC or headache. She did not have any increase in seizure activity.   She met a guy recently whom she has been dating for three months. She is actually going to be breaking up with him today. She was warned by several members of her church that she should break it off with him. She is going to have one of her friends with her in case and decides to do anything violent or concerning.   Asthma/Respiratory Symptom History: She is using her albuterol four puffs BID. She is on prednisone 56m daily. She is on Brovana and Pulmicort BID as well as Yupelri daily. She has not been shopping lately. She does online shopping. She has not been using any extra albuterol. Overall she  feels that her breathing is under good control. She was never able to schedule an appointment with the Pulmonologist. She tells me that the Pulmonologist was trying to make her get COVID testing done before the appointment with a PCR test, but she has not been able to get this done.   Allergic Rhinitis Symptom History: She remains on cetirizine daily.  She does have a nose spray that she uses occasionally.  She does not need antibiotics aside from the amoxicillin we gave in December.  She reports a loss of appetite. [place] after she breaks up with her boyfriend.  She is hoping that going out will increase her appetite a bit.  Otherwise, there have been no changes to her past medical history, surgical history, family history, or social history.    Review of Systems  Constitutional: Negative.  Negative for chills, fever, malaise/fatigue and weight loss.  HENT: Negative for congestion, ear discharge, ear pain and sinus pain.   Eyes: Negative for pain, discharge and redness.  Respiratory: Positive for cough and shortness of breath. Negative for sputum production and wheezing.   Cardiovascular: Negative.  Negative for chest pain and palpitations.  Gastrointestinal: Negative for abdominal pain, constipation, diarrhea, heartburn, nausea and vomiting.  Skin: Negative.  Negative for itching and rash.  Neurological: Negative for dizziness and headaches.  Endo/Heme/Allergies: Positive for environmental allergies. Does not bruise/bleed easily.       Objective:   Pulse 93, temperature 98.3 F (36.8 C), temperature source Tympanic, resp. rate (!) 24, height 5' 8" (1.727 m), weight 140 lb (63.5 kg), SpO2 96 %. Body mass index is 21.29 kg/m.   Physical Exam:  Physical Exam Constitutional:      Appearance: She is well-developed.     Comments: Lots of make-up.  HENT:     Head: Normocephalic and atraumatic.     Right Ear: Tympanic membrane, ear  canal and external ear normal. No drainage, swelling or tenderness. Tympanic membrane is not injected, scarred, erythematous, retracted or bulging.     Left Ear: Tympanic membrane, ear canal and external ear normal. No drainage, swelling or tenderness. Tympanic membrane is not injected, scarred, erythematous, retracted or bulging.     Nose: No nasal deformity, septal deviation, mucosal edema, rhinorrhea or epistaxis.     Right Turbinates: Enlarged and swollen.     Left Turbinates: Enlarged and swollen.     Right Sinus: No maxillary sinus tenderness or frontal sinus tenderness.     Left Sinus: No maxillary sinus tenderness or frontal sinus tenderness.     Mouth/Throat:     Mouth: Oropharynx is clear and moist. Mucous membranes are not pale and not dry.     Pharynx: Uvula midline.  Eyes:     General:        Right eye: No discharge.        Left eye: No discharge.     Extraocular Movements: EOM normal.     Conjunctiva/sclera: Conjunctivae normal.     Right eye: Right conjunctiva is not injected. No chemosis.    Left eye: Left conjunctiva is not injected. No chemosis.    Pupils: Pupils are equal, round, and reactive to light.  Cardiovascular:     Rate and Rhythm: Normal rate and regular rhythm.     Heart sounds: Normal heart sounds.  Pulmonary:     Effort: Pulmonary effort is normal. No tachypnea, accessory muscle usage or respiratory distress.     Breath sounds: Normal breath sounds. No wheezing, rhonchi or rales.     Comments: Inspiratory and expiratory wheezes throughout. Chest:     Chest wall: No tenderness.  Abdominal:     Tenderness: There is no abdominal tenderness. There is no guarding or rebound.  Lymphadenopathy:     Head:     Right side of head: No submandibular, tonsillar or occipital adenopathy.     Left side of head: No submandibular, tonsillar or occipital adenopathy.     Cervical: No cervical adenopathy.  Skin:    Coloration: Skin is not pale.     Findings: No  abrasion, erythema, petechiae or rash. Rash is not papular, urticarial or vesicular.  Neurological:     Mental Status: She is alert.  Psychiatric:        Mood and Affect: Mood and affect normal.      Diagnostic studies: none       Salvatore Marvel, MD  [facility]

## 2020-07-11 NOTE — Patient Instructions (Addendum)
1. Moderate persistent asthma with COPD overlap - Lung testing not done today.   - Continue with oxygen as needed.  - We are not going to make any changes at all today.  - Daily controller medication(s): prednisone 10mg  daily + Pulmicort (budesonide) + Brovana twice daily via nebulizer + Yupelri once daily via nebulizer + albuterol twice daily via nebulizer + Dalisrep daily + Xolair every two weeks  - Rescue medications: Xopenex (levalbuterol) + Atrovent (ipratropium) mixed together every 4-6 hours as needed via nebulizer - Asthma control goals:  * Full participation in all desired activities (may need albuterol before activity) * Albuterol use two time or less a week on average (not counting use with activity) * Cough interfering with sleep two time or less a month * Oral steroids no more than once a year * No hospitalizations  2. Allergic rhinitis - did allergy shots for three years - Continue with montelukast 10mg  daily.   3. Return in about 3 months (around 10/08/2020).    Please inform of any Emergency Department visits, hospitalizations, or changes in symptoms. Call 10/10/2020 before going to the ED for breathing or allergy symptoms since we might be able to fit you in for a sick visit. Feel free to contact us anytime with any questions, problems, or concerns.  It was a pleasure to talk to you today today!  Websites that have reliable patient information: 1. American Academy of Asthma, Allergy, and Immunology: www.aaaai.org 2. Food Allergy Research and Education (FARE): foodallergy.org 3. Mothers of Asthmatics: http://www.asthmacommunitynetwork.org 4. American College of Allergy, Asthma, and Immunology: www.acaai.org   COVID-19 Vaccine Information can be found at: [place] For questions related to vaccine distribution or appointments, please email [email or web address] or call [phone].     "Like" [email or web address] on  Facebook and Instagram for our latest updates!       Make sure you are registered to vote! If you have moved or changed any of your contact information, you will need to get this updated before voting!  In some cases, you MAY be able to register to vote online: [phone]

## 2020-07-13 ENCOUNTER — Telehealth: Payer: Self-pay | Admitting: Primary Care

## 2020-07-13 NOTE — Telephone Encounter (Signed)
Spoke with the pt  She states that she is unable to find a pharmacy in [place] in time to have her PFT done on 07/18/20  I cancelled the PFT and offered to reschedule further out, to give her time to look  She kept stating that she was unable to drive more than 4 miles and did not want to reschedule test right now  She states wants to keep appt with Beth on 07/18/20 so this appt not cancelled Nothing further needed at this time

## 2020-07-18 ENCOUNTER — Ambulatory Visit: Payer: 59

## 2020-07-18 ENCOUNTER — Ambulatory Visit: Payer: 59 | Admitting: Primary Care

## 2020-07-18 NOTE — Progress Notes (Deleted)
@Patient  ID: , female    DOB: 1950-08-08, 70 y.o.   MRN: [id number]  No chief complaint on file.   Referring provider: [id number]  HPI:  70 year old with history of severe persistent asthma, COPD overlap syndrome with chronic hypoxic respiratory failure on 2 L oxygen. Previously followed by Dr. 66 and Dr. Chestine Spore at the pulmonary office. She is on Xolair for the past 4 years which she gets through Dr. 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 She has been on several inhalers in the past but current regimen includes nebulizers including Brovana, Pulmicort, Yupelri and Daliresp p.o. she is on supplemental oxygen. Hospitalized with COVID-19 in January 2021 for 4 days. Given remdesivir and steroids.   Complains of chronic dyspnea on exertion, cough with white mucus  Pets: No pets Occupation: Retired Exposures: Reports seeing mold at home. No hot tub, Jacuzzi or feather pillows or comforters Smoking history: Smoked as a teenager. 8-pack-year smoking history Travel history: Previously lived in February 2021 in [place] Relevant family history: No significant family history of lung disease  07/18/2020 - Interim hx  Patient presents today 3 month follow-up.     Allergies  Allergen Reactions  . Iodine Itching, Rash and Swelling    IV contrast   . Iodine-131 Itching and Swelling    Pt states causes severe itching Pt states causes severe itching   . Penicillins Hives and Swelling    Swelling of the throat  . Pineapple Other (See Comments) and Swelling    Pt states causes her throat swelling Pt states causes her throat swelling   . Shellfish Allergy Rash and Swelling    Had reaction to cardiac cath dye  Had seizure ,rash ,itch Oct. 2012 Had reaction to cardiac cath dye  Had seizure ,rash ,itch Oct.  2012  Seizure during Cardiac Cath 2012  . 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 Pravastatin Other (See Comments)    Pt states she couldn't lift her legs to walk  . Shellfish-Derived Products   . Iodinated Diagnostic Agents Rash    Immunization History  Administered Date(s) Administered  . Fluad Quad(high Dose 65+) 02/19/2019  . Influenza, High Dose Seasonal PF 03/15/2016, 03/25/2017, 03/06/2018, 02/19/2019  . Janssen (J&J) SARS-COV-2 Vaccination 09/21/2019, 05/02/2020  . Pneumococcal Conjugate-13 04/16/2014  . Pneumococcal Polysaccharide-23 01/29/2011, 06/28/2015  . Tdap 08/07/2014    Past Medical History:  Diagnosis Date  . Allergic rhinitis   . Asthma   . COPD (chronic obstructive pulmonary disease) (HCC)     Tobacco History: Social History   Tobacco Use  Smoking Status Former Smoker  . Packs/day: 0.50  . Years: 15.00  . Pack years: 7.50  . Types: Cigarettes  . Quit date: 06/12/1995  . Years since quitting: 25.1  Smokeless Tobacco Never Used   Counseling given: Not Answered   Outpatient Medications Prior to Visit  Medication Sig Dispense Refill  . albuterol (VENTOLIN HFA) 108 (90 Base) MCG/ACT inhaler Inhale 2 puffs into the lungs every 4 (four) hours as needed for wheezing or shortness of breath. 1 each 5  . arformoterol (BROVANA) 15 MCG/2ML NEBU Take 2 mLs (15 mcg total) by nebulization 2 (two) times daily. 120 mL 11  . aspirin 81 MG tablet Take 81 mg by mouth daily.    08/10/1995  b complex vitamins capsule Take 1 capsule by mouth daily.    . budesonide (PULMICORT) 0.5 MG/2ML nebulizer solution USE 1 VIAL  IN  NEBULIZER TWICE  DAILY - rinse mouth after treatment 360 mL 3  . carbamazepine (TEGRETOL) 200 MG tablet Take 200-400 mg by mouth 2 (two) times daily. 200mg  tab in the morning and 400mg  at night    . cyclobenzaprine (FLEXERIL) 5 MG tablet Take 5 mg by mouth daily.    DALIRESP 500 MCG TABS tablet TAKE 1 TABLET BY MOUTH DAILY 90 tablet 3   . diclofenac sodium (VOLTAREN) 1 % GEL Apply 2 g topically 4 (four) times daily as needed (knee pain).     dicyclomine (BENTYL) 10 MG capsule Take 10 mg by mouth 4 (four) times daily -  before meals and at bedtime.     . diphenoxylate-atropine (LOMOTIL) 2.5-0.025 MG tablet Take 1 tablet by mouth 4 (four) times daily as needed for diarrhea or loose stools.    Marland Kitchen EPINEPHrine (EPIPEN 2-PAK) 0.3 mg/0.3 mL IJ SOAJ injection Inject 0.3 mg into the muscle Once PRN. 1 each 2  . estradiol (ESTRACE) 1 MG tablet Take 1 mg by mouth daily.  3  . fluocinonide cream (LIDEX) 0.05 % Apply 1 application topically 2 (two) times daily.    08-28-1989 gabapentin (NEURONTIN) 600 MG tablet Take 600-1,200 mg by mouth 2 (two) times daily. 600mg  in the morning and 1200mg  at bedtime    . ipratropium (ATROVENT) 0.02 % nebulizer solution Take 2.5 mLs (0.5 mg total) by nebulization 4 (four) times daily. 875 mL 1  . levalbuterol (XOPENEX HFA) 45 MCG/ACT inhaler Inhale 2 puffs into the lungs every 4 (four) hours as needed for wheezing. (Patient not taking: Reported on 07/11/2020) 15 g 2  . levalbuterol (XOPENEX) 0.63 MG/3ML nebulizer solution Take 3 mLs (0.63 mg total) by nebulization every 4 (four) hours as needed. Mix with Ipratropium. 90 mL 1  . levETIRAcetam (KEPPRA) 1000 MG tablet Take 1,000-2,000 mg by mouth 2 (two) times daily. 1000mg  in the morning and 2000mg  at night    . levocetirizine (XYZAL) 5 MG tablet TAKE 1 TABLET (5 MG TOTAL) BY MOUTH DAILY AS NEEDED FOR ALLERGIES (FOR RUNNY NOSE). 90 tablet 1  . lidocaine (LIDODERM) 5 % Place 1 patch onto the skin daily.     Marland Kitchen lisinopril (PRINIVIL,ZESTRIL) 10 MG tablet Take 10 mg by mouth daily.     LORazepam (ATIVAN) 1 MG tablet Take 1 mg by mouth 3 (three) times daily.    . meclizine (ANTIVERT) 25 MG tablet Take 25 mg by mouth 2 (two) times daily as needed for dizziness or nausea.     . Melatonin 10 MG TABS Take 10 mg by mouth daily.    . mirtazapine (REMERON) 30 MG tablet Take 30 mg  by mouth every evening.     . montelukast (SINGULAIR) 10 MG tablet Take 1 tablet by mouth everyday at bedtime 90 tablet 3  . Multiple Vitamin (MULTIVITAMIN) tablet Take 1 tablet by mouth daily.     omeprazole (PRILOSEC) 40 MG capsule Take 40 mg by mouth 2 (two) times a day.    . ondansetron (ZOFRAN) 4 MG tablet Take 1 tablet by mouth every 8 (eight) hours as needed.    . OXYGEN Inhale 3 L into the lungs at bedtime. 4 L when walking    . polyvinyl alcohol (LIQUIFILM TEARS) 1.4 % ophthalmic solution Place 1 drop into both eyes as needed for  dry eyes.    . predniSONE (DELTASONE) 10 MG tablet     . promethazine-dextromethorphan (PROMETHAZINE-DM) 6.25-15 MG/5ML syrup Take 2.5 mLs by mouth 4 (four) times daily as needed for cough. (Patient not taking: Reported on 07/11/2020) 118 mL 0  . revefenacin (YUPELRI) 175 MCG/3ML nebulizer solution Inhale 3 mLs (175 mcg total) into the lungs daily. 90 mL 11  . sodium chloride (OCEAN) 0.65 % SOLN nasal spray Place 1 spray into both nostrils as needed for congestion.     Facility-Administered Medications Prior to Visit  Medication Dose Route Frequency Provider Last Rate Last Admin  . omalizumab Geoffry [place]) injection 150 mg  150 mg Subcutaneous Q14 Days Alfonse Spruce, MD   150 mg at 07/06/20 8938      Review of Systems  Review of Systems   Physical Exam  There were no vitals taken for this visit. Physical Exam   Lab Results:  CBC    Component Value Date/Time   WBC 9.4 06/14/2020 1339   RBC 4.10 06/14/2020 1339   HGB 13.2 06/15/2020 0315   HGB 14.7 09/23/2018 1021   HCT 39.4 06/15/2020 0315   HCT 44.3 09/23/2018 1021   PLT 426 (H) 06/14/2020 1339   PLT 380 09/23/2018 1021   MCV 95.4 06/14/2020 1339   MCV 93 09/23/2018 1021   MCH 30.2 06/14/2020 1339   MCHC 31.7 06/14/2020 1339   RDW 12.7 06/14/2020 1339   RDW 12.5 09/23/2018 1021   LYMPHSABS 2.0 06/22/2019 0750   LYMPHSABS 2.7 09/23/2018 1021   MONOABS 1.0 06/22/2019 0750    EOSABS 0.0 06/22/2019 0750   EOSABS 0.2 09/23/2018 1021   BASOSABS 0.0 06/22/2019 0750   BASOSABS 0.1 09/23/2018 1021    BMET    Component Value Date/Time   NA 134 (L) 06/14/2020 1339   K 4.3 06/14/2020 1339   CL 97 (L) 06/14/2020 1339   CO2 28 06/14/2020 1339   GLUCOSE 97 06/14/2020 1339   BUN 6 (L) 06/14/2020 1339   CREATININE 0.63 06/14/2020 1339   CALCIUM 9.1 06/14/2020 1339   GFRNONAA >60 06/14/2020 1339   GFRAA >60 06/22/2019 0750    BNP    Component Value Date/Time   BNP 87.2 06/22/2019 0750    ProBNP No results found for: PROBNP  Imaging: No results found.   Assessment & Plan:   No problem-specific Assessment & Plan notes found for this encounter.     Glenford Bayley, NP 07/18/2020

## 2020-07-19 DIAGNOSIS — J4540 Moderate persistent asthma, uncomplicated: Secondary | ICD-10-CM | POA: Diagnosis not present

## 2020-07-20 ENCOUNTER — Other Ambulatory Visit: Payer: Self-pay

## 2020-07-20 ENCOUNTER — Ambulatory Visit (INDEPENDENT_AMBULATORY_CARE_PROVIDER_SITE_OTHER): Payer: Medicare Other

## 2020-07-20 DIAGNOSIS — J4540 Moderate persistent asthma, uncomplicated: Secondary | ICD-10-CM | POA: Diagnosis not present

## 2020-07-21 ENCOUNTER — Other Ambulatory Visit: Payer: Self-pay | Admitting: Allergy & Immunology

## 2020-07-27 ENCOUNTER — Encounter: Payer: Self-pay | Admitting: Primary Care

## 2020-07-27 ENCOUNTER — Other Ambulatory Visit: Payer: Self-pay

## 2020-07-27 ENCOUNTER — Ambulatory Visit (INDEPENDENT_AMBULATORY_CARE_PROVIDER_SITE_OTHER): Payer: Medicare Other | Admitting: Primary Care

## 2020-07-27 VITALS — BP 124/82 | HR 98 | Temp 98.2°F | Ht 68.0 in | Wt 142.0 lb

## 2020-07-27 DIAGNOSIS — J9611 Chronic respiratory failure with hypoxia: Secondary | ICD-10-CM

## 2020-07-27 DIAGNOSIS — R58 Hemorrhage, not elsewhere classified: Secondary | ICD-10-CM

## 2020-07-27 DIAGNOSIS — J449 Chronic obstructive pulmonary disease, unspecified: Secondary | ICD-10-CM | POA: Diagnosis not present

## 2020-07-27 DIAGNOSIS — T148XXA Other injury of unspecified body region, initial encounter: Secondary | ICD-10-CM

## 2020-07-27 LAB — PROTIME-INR
INR: 1 ratio (ref 0.8–1.0)
Prothrombin Time: 10.8 s (ref 9.6–13.1)

## 2020-07-27 MED ORDER — PROMETHAZINE-DM 6.25-15 MG/5ML PO SYRP: 2.5000 mL | ORAL_SOLUTION | Freq: Four times a day (QID) | ORAL | 0 refills | Status: DC | PRN

## 2020-07-27 NOTE — Progress Notes (Signed)
@Patient  ID: , female    DOB: 1951/03/06, 70 y.o.   MRN: [id number]  Chief Complaint  Patient presents with  . Follow-up    Sob is better since last visit. Prednisone and Singulair working well.  O2 @ 4L with excertion. At night 2-3L of O2.  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 Patient of Dr. 66, last seen on 04/15/20. Previously followed by Dr. 13/5/21 and Dr. Chestine Spore at the pulmonary office. She is on Xolair for the past 4 years which she gets through Dr. [name], allergy. Previously tried on Dellis Anes some years ago which was not effective. She has been on several inhalers in the past but current regimen includes nebulizers including Brovana, Pulmicort, Yupelri and Daliresp p.o. She is on supplemental oxygen. She was referred in hospice in 2020 but because self offered. Hospitalized with COVID-19 in January 2021 for 4 days. Given remdesivir and steroids.   Pets: No pets Occupation: Retired Exposures: Reports seeing mold at home. No hot tub, Jacuzzi or feather pillows or comforters Smoking history: Smoked as a teenager. 8-pack-year smoking history Travel history: Previously lived in February 2021 in [place] Relevant family history: No significant family history of lung disease  07/27/2020 - Interim hx  Patient presents today for a 3 month follow-up. She is doing some better. No acute respiratory symptoms today. Breathing varies depending on humidity. She has a chronic cough. Maintained on Brovana, Pulmicort, Yupelri, Daliresp, Xyzal, Singulair and Xolair. She is follows with Dr. 05-20-1997 who has her on daily prednisone. She calls for [facility] which has been a good lung exercise for her, however, she does occasionally get hoarse from straining her voice.    Allergies  Allergen Reactions  .  Iodine Itching, Rash and Swelling    IV contrast   . Iodine-131 Itching and Swelling    Pt states causes severe itching Pt states causes severe itching   . Penicillins Hives and Swelling    Swelling of the throat  . Pineapple Other (See Comments) and Swelling    Pt states causes her throat swelling Pt states causes her throat swelling   . Shellfish Allergy Rash and Swelling    Had reaction to cardiac cath dye  Had seizure ,rash ,itch Oct. 2012 Had reaction to cardiac cath dye  Had seizure ,rash ,itch Oct. 2012  Seizure during Cardiac Cath 2012  . 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 Pravastatin Other (See Comments)    Pt states she couldn't lift her legs to walk  . Shellfish-Derived Products   . Iodinated Diagnostic Agents Rash    Immunization History  Administered Date(s) Administered  . Fluad Quad(high Dose 65+) 02/19/2019  . Influenza, High Dose Seasonal PF 03/15/2016, 03/25/2017, 03/06/2018, 02/19/2019, 05/02/2020  . Janssen (J&J) SARS-COV-2 Vaccination 09/21/2019, 05/02/2020  . Pneumococcal Conjugate-13 04/16/2014  . Pneumococcal Polysaccharide-23 01/29/2011, 06/28/2015  . Tdap 08/07/2014    Past Medical History:  Diagnosis Date  . Allergic rhinitis   . Asthma   . COPD (chronic obstructive pulmonary disease) (HCC)     Tobacco History: Social History   Tobacco Use  Smoking Status Former Smoker  . Packs/day: 0.50  . Years: 15.00  . Pack years: 7.50  . Types: Cigarettes  . Quit date: 06/11/1994  . Years since quitting:  26.1  Smokeless Tobacco Never Used   Counseling given: Not Answered   Outpatient Medications Prior to Visit  Medication Sig Dispense Refill  . albuterol (VENTOLIN HFA) 108 (90 Base) MCG/ACT inhaler Inhale 2 puffs into the lungs every 4 (four) hours as needed for wheezing or shortness of breath. 1 each 5  . arformoterol (BROVANA) 15 MCG/2ML NEBU Take 2 mLs (15 mcg total) by nebulization 2 (two) times daily. 120  mL 11  . aspirin 81 MG tablet Take 81 mg by mouth daily.    . budesonide (PULMICORT) 0.5 MG/2ML nebulizer solution USE 1 VIAL  IN  NEBULIZER TWICE  DAILY - rinse mouth after treatment 360 mL 3  . carbamazepine (TEGRETOL) 200 MG tablet Take 200-400 mg by mouth 2 (two) times daily. 200mg  tab in the morning and 400mg  at night    . cyclobenzaprine (FLEXERIL) 5 MG tablet Take 5 mg by mouth daily.    DALIRESP 500 MCG TABS tablet TAKE 1 TABLET BY MOUTH DAILY 90 tablet 3  . diclofenac sodium (VOLTAREN) 1 % GEL Apply 2 g topically 4 (four) times daily as needed (knee pain).     dicyclomine (BENTYL) 10 MG capsule Take 10 mg by mouth 4 (four) times daily -  before meals and at bedtime.     . diphenoxylate-atropine (LOMOTIL) 2.5-0.025 MG tablet Take 1 tablet by mouth 4 (four) times daily as needed for diarrhea or loose stools.    Marland Kitchen EPINEPHrine (EPIPEN 2-PAK) 0.3 mg/0.3 mL IJ SOAJ injection Inject 0.3 mg into the muscle Once PRN. 1 each 2  . estradiol (ESTRACE) 1 MG tablet Take 1 mg by mouth daily.  3  . gabapentin (NEURONTIN) 600 MG tablet Take 600-1,200 mg by mouth 2 (two) times daily. 600mg  in the morning and 1200mg  at bedtime    . ipratropium (ATROVENT) 0.02 % nebulizer solution Take 2.5 mLs (0.5 mg total) by nebulization 4 (four) times daily. 875 mL 1  . levalbuterol (XOPENEX HFA) 45 MCG/ACT inhaler Inhale 2 puffs into the lungs every 4 (four) hours as needed for wheezing. 15 g 2  . levalbuterol (XOPENEX) 0.63 MG/3ML nebulizer solution TAKE 3 MLS (0.63 MG TOTAL) BY NEBULIZATION EVERY 4 (FOUR) HOURS AS NEEDED. MIX WITH IPRATROPIUM. 90 mL 1  . levETIRAcetam (KEPPRA) 1000 MG tablet Take 1,000-2,000 mg by mouth 2 (two) times daily. 1000mg  in the morning and 2000mg  at night    . levocetirizine (XYZAL) 5 MG tablet TAKE 1 TABLET (5 MG TOTAL) BY MOUTH DAILY AS NEEDED FOR ALLERGIES (FOR RUNNY NOSE). 90 tablet 1  . lidocaine (LIDODERM) 5 % Place 1 patch onto the skin daily.     08-28-1989 lisinopril (PRINIVIL,ZESTRIL) 10  MG tablet Take 10 mg by mouth daily.     Marland Kitchen LORazepam (ATIVAN) 1 MG tablet Take 1 mg by mouth 3 (three) times daily.    . meclizine (ANTIVERT) 25 MG tablet Take 25 mg by mouth 2 (two) times daily as needed for dizziness or nausea.     . Melatonin 10 MG TABS Take 10 mg by mouth daily.    . mirtazapine (REMERON) 30 MG tablet Take 30 mg by mouth every evening.     . montelukast (SINGULAIR) 10 MG tablet Take 1 tablet by mouth everyday at bedtime 90 tablet 3  . Multiple Vitamin (MULTIVITAMIN) tablet Take 1 tablet by mouth daily.     omeprazole (PRILOSEC) 40 MG capsule Take 40 mg by mouth 2 (two) times a day.     ondansetron (ZOFRAN) 4 MG tablet Take 1 tablet by mouth every 8 (eight) hours as needed.    . OXYGEN Inhale 3 L into the lungs at bedtime. 4 L when walking    . polyvinyl alcohol (LIQUIFILM TEARS) 1.4 % ophthalmic solution Place 1 drop into both eyes as needed for dry eyes.    . predniSONE (DELTASONE) 10 MG tablet     . revefenacin (YUPELRI) 175 MCG/3ML nebulizer solution Inhale 3 mLs (175 mcg total) into the lungs daily. 90 mL 11  . sodium chloride (OCEAN) 0.65 % SOLN nasal spray Place 1 spray into both nostrils as needed for congestion.    . promethazine-dextromethorphan (PROMETHAZINE-DM) 6.25-15 MG/5ML syrup Take 2.5 mLs by mouth 4 (four) times daily as needed for cough. 118 mL 0  . b complex vitamins capsule Take 1 capsule by mouth daily. (Patient not taking: Reported on 07/27/2020)    . fluocinonide cream (LIDEX) 0.05 % Apply 1 application topically 2 (two) times daily. (Patient not taking: Reported on 07/27/2020)     Facility-Administered Medications Prior to Visit  Medication Dose Route Frequency Provider Last Rate Last Admin  . omalizumab Geoffry Paradise) injection 150 mg  150 mg Subcutaneous Q14 Days Alfonse Spruce, MD   150 mg at 07/20/20 0848   Review of Systems  Review of Systems  Constitutional: Negative.   Respiratory: Positive for cough. Negative for chest tightness,  shortness of breath and wheezing.   Musculoskeletal: Negative for neck pain.   Physical Exam  BP 124/82 (BP Location: Left Arm, Cuff Size: Normal)   Pulse 98   Temp 98.2 F (36.8 C)   Ht 5\' 8"  (1.727 m)   Wt 142 lb (64.4 kg)   SpO2 96%   BMI 21.59 kg/m  Physical Exam Constitutional:      Appearance: Normal appearance.  HENT:     Head: Normocephalic and atraumatic.     Mouth/Throat:     Mouth: Mucous membranes are moist.     Pharynx: Oropharynx is clear. Posterior oropharyngeal erythema present. No oropharyngeal exudate.  Cardiovascular:     Rate and Rhythm: Normal rate and regular rhythm.  Musculoskeletal:        General: Normal range of motion.     Cervical back: Normal range of motion and neck supple.  Skin:    General: Skin is warm and dry.  Neurological:     General: No focal deficit present.     Mental Status: She is alert and oriented to person, place, and time. Mental status is at baseline.  Psychiatric:        Mood and Affect: Mood normal.        Behavior: Behavior normal.        Thought Content: Thought content normal.        Judgment: Judgment normal.      Lab Results:  CBC    Component Value Date/Time   WBC 9.4 06/14/2020 1339   RBC 4.10 06/14/2020 1339   HGB 13.2 06/15/2020 0315   HGB 14.7 09/23/2018 1021   HCT 39.4 06/15/2020 0315   HCT 44.3 09/23/2018 1021   PLT 426 (H) 06/14/2020 1339   PLT 380 09/23/2018 1021   MCV 95.4 06/14/2020 1339   MCV 93 09/23/2018 1021   MCH 30.2 06/14/2020 1339   MCHC 31.7 06/14/2020 1339   RDW 12.7 06/14/2020 1339   RDW 12.5 09/23/2018 1021   LYMPHSABS 2.0 06/22/2019 0750   LYMPHSABS 2.7 09/23/2018 1021   MONOABS 1.0 06/22/2019 0750  EOSABS 0.0 06/22/2019 0750   EOSABS 0.2 09/23/2018 1021   BASOSABS 0.0 06/22/2019 0750   BASOSABS 0.1 09/23/2018 1021    BMET    Component Value Date/Time   NA 134 (L) 06/14/2020 1339   K 4.3 06/14/2020 1339   CL 97 (L) 06/14/2020 1339   CO2 28 06/14/2020 1339   GLUCOSE  97 06/14/2020 1339   BUN 6 (L) 06/14/2020 1339   CREATININE 0.63 06/14/2020 1339   CALCIUM 9.1 06/14/2020 1339   GFRNONAA >60 06/14/2020 1339   GFRAA >60 06/22/2019 0750    BNP    Component Value Date/Time   BNP 87.2 06/22/2019 0750    ProBNP No results found for: PROBNP  Imaging: No results found.   Assessment & Plan:   Asthma-COPD overlap syndrome (HCC) - She is doing some better. Breathing varies based on humidity. She has a chronic cough. - Alpha 1 was ordered back in June 2021 but not collected - Maintained on Brovana and Budesonide BID, Yupelri daily, Daliresp 500mcg, Singulair 10mg , Xolair 150mg  q 14 days and prednisone 5mg  daily. Needs refill of cough medication promethazine DM. - FU in 3 months with Dr. Ivor CostaMannam   Ecchymosis - Scattered ecchymosis to arms. Thin/fragile skin.  - She is on daily prednisone and baby aspirin - Check PT/INR  Chronic respiratory failure with hypoxia (HCC), 3L home O2 - Continues to benefit from supplemental oxygen 3L on exertion and at night    Glenford BayleyElizabeth W Mccall Will, NP 07/27/2020

## 2020-07-27 NOTE — Assessment & Plan Note (Signed)
-   Continues to benefit from supplemental oxygen 3L on exertion and at night

## 2020-07-27 NOTE — Assessment & Plan Note (Addendum)
-   She is doing some better. Breathing varies based on humidity. She has a chronic cough. - Alpha 1 was ordered back in June 2021 but not collected - Maintained on Brovana and Budesonide BID, Yupelri daily, Daliresp , Singulair 10mg , Xolair 150mg  q 14 days and prednisone 5mg  daily. Needs refill of cough medication promethazine DM. - FU in 3 months with Dr. 

## 2020-07-27 NOTE — Progress Notes (Signed)
Please let Anne Rich now her coag panel was normal. Bruising could be from the prednisone and aspirin. Would not change current treatment plan. She can follow-up with her pcp if any additional concerns

## 2020-07-27 NOTE — Patient Instructions (Signed)
Continue Brovana, Pulmicort, Yupelri, ipratropium-albuterol nebulizer as prescribed  Continue Daliresp and prednisone Continue Xyzal and Singulair Continue supplemental oxygen   Orders: PT/INR lab today  Follow-up: 4 months with Dr. Isaiah Serge or sooner if needed

## 2020-07-27 NOTE — Assessment & Plan Note (Signed)
-   Scattered ecchymosis to arms. Thin/fragile skin.  - She is on daily prednisone and baby aspirin - Check PT/INR

## 2020-08-02 DIAGNOSIS — J4540 Moderate persistent asthma, uncomplicated: Secondary | ICD-10-CM | POA: Diagnosis not present

## 2020-08-03 ENCOUNTER — Other Ambulatory Visit: Payer: Self-pay

## 2020-08-03 ENCOUNTER — Ambulatory Visit (INDEPENDENT_AMBULATORY_CARE_PROVIDER_SITE_OTHER): Payer: Medicare Other

## 2020-08-03 DIAGNOSIS — J4540 Moderate persistent asthma, uncomplicated: Secondary | ICD-10-CM | POA: Diagnosis not present

## 2020-08-05 ENCOUNTER — Other Ambulatory Visit: Payer: Self-pay | Admitting: Allergy & Immunology

## 2020-08-17 ENCOUNTER — Ambulatory Visit: Payer: Self-pay

## 2020-08-19 ENCOUNTER — Other Ambulatory Visit: Payer: Self-pay | Admitting: Allergy & Immunology

## 2020-08-20 ENCOUNTER — Other Ambulatory Visit: Payer: Self-pay | Admitting: Pulmonary Disease

## 2020-08-23 ENCOUNTER — Ambulatory Visit: Payer: Medicare Other

## 2020-08-23 DIAGNOSIS — J4550 Severe persistent asthma, uncomplicated: Secondary | ICD-10-CM

## 2020-08-24 ENCOUNTER — Other Ambulatory Visit: Payer: Self-pay

## 2020-08-24 ENCOUNTER — Ambulatory Visit (INDEPENDENT_AMBULATORY_CARE_PROVIDER_SITE_OTHER): Payer: Medicare Other | Admitting: *Deleted

## 2020-08-24 DIAGNOSIS — J4550 Severe persistent asthma, uncomplicated: Secondary | ICD-10-CM

## 2020-09-06 DIAGNOSIS — J4540 Moderate persistent asthma, uncomplicated: Secondary | ICD-10-CM | POA: Diagnosis not present

## 2020-09-07 ENCOUNTER — Other Ambulatory Visit: Payer: Self-pay

## 2020-09-07 ENCOUNTER — Ambulatory Visit (INDEPENDENT_AMBULATORY_CARE_PROVIDER_SITE_OTHER): Payer: Medicare Other

## 2020-09-07 DIAGNOSIS — J4540 Moderate persistent asthma, uncomplicated: Secondary | ICD-10-CM | POA: Diagnosis not present

## 2020-09-15 ENCOUNTER — Other Ambulatory Visit: Payer: Self-pay | Admitting: Allergy & Immunology

## 2020-09-16 ENCOUNTER — Other Ambulatory Visit: Payer: Self-pay | Admitting: Allergy & Immunology

## 2020-09-16 NOTE — Telephone Encounter (Signed)
Please advise to prednisone refill

## 2020-09-20 DIAGNOSIS — J4550 Severe persistent asthma, uncomplicated: Secondary | ICD-10-CM

## 2020-09-21 ENCOUNTER — Other Ambulatory Visit: Payer: Self-pay

## 2020-09-21 ENCOUNTER — Ambulatory Visit (INDEPENDENT_AMBULATORY_CARE_PROVIDER_SITE_OTHER): Payer: Medicare Other | Admitting: *Deleted

## 2020-09-21 DIAGNOSIS — J4550 Severe persistent asthma, uncomplicated: Secondary | ICD-10-CM | POA: Diagnosis not present

## 2020-09-26 ENCOUNTER — Other Ambulatory Visit: Payer: Self-pay

## 2020-09-26 MED ORDER — ALBUTEROL SULFATE HFA 108 (90 BASE) MCG/ACT IN AERS: 2.0000 | INHALATION_SPRAY | RESPIRATORY_TRACT | 3 refills | Status: DC | PRN

## 2020-10-03 ENCOUNTER — Telehealth: Payer: Self-pay | Admitting: Family Medicine
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
## 2020-10-03 NOTE — Telephone Encounter (Signed)
Can you please let this patient know that she can take 20 mg of prednisone for the next 3 days, then please have her decrease back to prednisone 10 mg once a day. Thank you
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
## 2020-10-03 NOTE — Telephone Encounter (Signed)
Pt informed of this and will do it and give [place] an update in a few days

## 2020-10-04 ENCOUNTER — Telehealth: Payer: Self-pay | Admitting: Pulmonary Disease

## 2020-10-04 DIAGNOSIS — J4550 Severe persistent asthma, uncomplicated: Secondary | ICD-10-CM | POA: Diagnosis not present

## 2020-10-04 MED ORDER — PROMETHAZINE-DM 6.25-15 MG/5ML PO SYRP: 2.5000 mL | ORAL_SOLUTION | Freq: Four times a day (QID) | ORAL | 0 refills | Status: DC | PRN

## 2020-10-04 MED ORDER — PREDNISONE 20 MG PO TABS: 40.0000 mg | ORAL_TABLET | Freq: Every day | ORAL | 0 refills | Status: DC
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
## 2020-10-04 NOTE — Telephone Encounter (Signed)
ATC, no answer, left VM 

## 2020-10-04 NOTE — Telephone Encounter (Signed)
She will need a higher dose of prednisone Please send in a prescription for prednisone 40 mg a day for 5 days Okay to give a prescription for promethazine cough syrup as well

## 2020-10-04 NOTE — Telephone Encounter (Signed)
Called spoke with patient  Let her know Dr. Shirlee More recommendations Patient voiced understanding Verified pharmacy  Orders placed Nothing further needed at this time

## 2020-10-05 ENCOUNTER — Other Ambulatory Visit: Payer: Self-pay

## 2020-10-05 ENCOUNTER — Ambulatory Visit (INDEPENDENT_AMBULATORY_CARE_PROVIDER_SITE_OTHER): Payer: Medicare Other | Admitting: *Deleted

## 2020-10-05 DIAGNOSIS — J4550 Severe persistent asthma, uncomplicated: Secondary | ICD-10-CM

## 2020-10-10 ENCOUNTER — Ambulatory Visit (INDEPENDENT_AMBULATORY_CARE_PROVIDER_SITE_OTHER): Payer: Medicare Other | Admitting: Allergy & Immunology

## 2020-10-10 ENCOUNTER — Other Ambulatory Visit: Payer: Self-pay

## 2020-10-10 DIAGNOSIS — J449 Chronic obstructive pulmonary disease, unspecified: Secondary | ICD-10-CM | POA: Diagnosis not present

## 2020-10-10 DIAGNOSIS — K219 Gastro-esophageal reflux disease without esophagitis: Secondary | ICD-10-CM

## 2020-10-10 DIAGNOSIS — T380X5D Adverse effect of glucocorticoids and synthetic analogues, subsequent encounter: Secondary | ICD-10-CM

## 2020-10-10 DIAGNOSIS — Z0000 Encounter for general adult medical examination without abnormal findings: Secondary | ICD-10-CM
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
## 2020-10-10 NOTE — Patient Instructions (Addendum)
1. Moderate persistent asthma with COPD overlap - Lung testing not done today.   - Continue with oxygen as needed.  - We are not going to make any changes at all today.   - I do want you to get a DEXA scan to make sure that you do not have osteoporosis.  - There are good easy drugs for treating this.  - Daily controller medication(s): prednisone 10mg  daily + Pulmicort (budesonide) + Brovana twice daily via nebulizer + Yupelri once daily via nebulizer + albuterol twice daily via nebulizer + Dalisrep daily + Xolair every two weeks  - Rescue medications: Xopenex (levalbuterol) + Atrovent (ipratropium) mixed together every 4-6 hours as needed via nebulizer - Asthma control goals:  * Full participation in all desired activities (may need albuterol before activity) * Albuterol use two time or less a week on average (not counting use with activity) * Cough interfering with sleep two time or less a month * Oral steroids no more than once a year * No hospitalizations  2. Allergic rhinitis - did allergy shots for three years - Continue with montelukast 10mg  daily.  - Continue with levocetirizine 5mg  daily.   3. Return in about 3 months (around 01/10/2021).    Please inform of any Emergency Department visits, hospitalizations, or changes in symptoms. Call before going to the ED for breathing or allergy symptoms since we might be able to fit you in for a sick visit. Feel free to contact 03/12/2021 anytime with any questions, problems, or concerns.  It was a pleasure to see you again today!  Websites that have reliable patient information: 1. American Academy of Asthma, Allergy, and Immunology: www.aaaai.org 2. Food Allergy Research and Education (FARE): foodallergy.org 3. Mothers of Asthmatics: http://www.asthmacommunitynetwork.org 4. American College of Allergy, Asthma, and Immunology: www.acaai.org   COVID-19 Vaccine Information can be found at:  [place] For questions related to vaccine distribution or appointments, please email [email or web address] or call [phone].   We realize that you might be concerned about having an allergic reaction to the COVID19 vaccines. To help with that concern, WE ARE OFFERING THE COVID19 VACCINES IN OUR OFFICE! Ask the front desk for dates!     "Like" [place] on Facebook and Instagram for our latest updates!      A healthy democracy works best when [email or web address] participate! Make sure you are registered to vote! If you have moved or changed any of your contact information, you will need to get this updated before voting!  In some cases, you MAY be able to register to vote online: [phone]

## 2020-10-11 ENCOUNTER — Encounter: Payer: Self-pay | Admitting: Allergy & Immunology

## 2020-10-19 ENCOUNTER — Ambulatory Visit: Payer: Medicare Other

## 2020-10-20 ENCOUNTER — Ambulatory Visit: Payer: Medicare Other

## 2020-10-25 DIAGNOSIS — J4540 Moderate persistent asthma, uncomplicated: Secondary | ICD-10-CM

## 2020-10-26 ENCOUNTER — Telehealth: Payer: Self-pay | Admitting: *Deleted

## 2020-10-26 ENCOUNTER — Ambulatory Visit (INDEPENDENT_AMBULATORY_CARE_PROVIDER_SITE_OTHER): Payer: Medicare Other

## 2020-10-26 ENCOUNTER — Other Ambulatory Visit: Payer: Self-pay

## 2020-10-26 DIAGNOSIS — J4540 Moderate persistent asthma, uncomplicated: Secondary | ICD-10-CM

## 2020-10-26 NOTE — Telephone Encounter (Signed)
Anne Rich wants to know if she can bleach her teeth at her dentist?

## 2020-10-27 NOTE — Telephone Encounter (Signed)
Called and spoke to patient and she stated she isn't getting enough levalbuterol for the month, I'm going to call the pharmacy to change her script to 90 day supply.   And she was pleased to know she can have her teeth whitened.

## 2020-10-27 NOTE — Telephone Encounter (Signed)
I do not see a problem with this.   Malachi Bonds, MD [facility]

## 2020-11-05 ENCOUNTER — Other Ambulatory Visit: Payer: Self-pay | Admitting: Allergy & Immunology

## 2020-11-08 DIAGNOSIS — J4540 Moderate persistent asthma, uncomplicated: Secondary | ICD-10-CM

## 2020-11-09 ENCOUNTER — Ambulatory Visit (INDEPENDENT_AMBULATORY_CARE_PROVIDER_SITE_OTHER): Payer: Medicare Other

## 2020-11-09 ENCOUNTER — Other Ambulatory Visit: Payer: Self-pay

## 2020-11-09 DIAGNOSIS — J4540 Moderate persistent asthma, uncomplicated: Secondary | ICD-10-CM | POA: Diagnosis not present

## 2020-11-23 ENCOUNTER — Ambulatory Visit: Payer: Medicare Other

## 2020-11-23 DIAGNOSIS — J4540 Moderate persistent asthma, uncomplicated: Secondary | ICD-10-CM

## 2020-11-24 ENCOUNTER — Ambulatory Visit: Payer: 59 | Admitting: Pulmonary Disease

## 2020-11-24 ENCOUNTER — Other Ambulatory Visit: Payer: Self-pay

## 2020-11-24 ENCOUNTER — Ambulatory Visit (INDEPENDENT_AMBULATORY_CARE_PROVIDER_SITE_OTHER): Payer: Medicare Other

## 2020-11-24 DIAGNOSIS — J4540 Moderate persistent asthma, uncomplicated: Secondary | ICD-10-CM

## 2020-11-24 MED ORDER — LEVALBUTEROL HCL 0.63 MG/3ML IN NEBU: 0.6300 mg | INHALATION_SOLUTION | RESPIRATORY_TRACT | 1 refills | Status: DC | PRN

## 2020-12-07 ENCOUNTER — Ambulatory Visit (INDEPENDENT_AMBULATORY_CARE_PROVIDER_SITE_OTHER): Payer: Medicare Other

## 2020-12-07 ENCOUNTER — Other Ambulatory Visit: Payer: Self-pay

## 2020-12-07 DIAGNOSIS — J4540 Moderate persistent asthma, uncomplicated: Secondary | ICD-10-CM

## 2020-12-20 DIAGNOSIS — J4540 Moderate persistent asthma, uncomplicated: Secondary | ICD-10-CM | POA: Diagnosis not present

## 2020-12-21 ENCOUNTER — Other Ambulatory Visit: Payer: Self-pay

## 2020-12-21 ENCOUNTER — Ambulatory Visit (INDEPENDENT_AMBULATORY_CARE_PROVIDER_SITE_OTHER): Payer: Medicare Other

## 2020-12-21 DIAGNOSIS — J4540 Moderate persistent asthma, uncomplicated: Secondary | ICD-10-CM | POA: Diagnosis not present

## 2020-12-31 ENCOUNTER — Other Ambulatory Visit: Payer: Self-pay | Admitting: Allergy & Immunology

## 2021-01-03 ENCOUNTER — Encounter: Payer: Self-pay | Admitting: Pulmonary Disease

## 2021-01-03 ENCOUNTER — Other Ambulatory Visit: Payer: Self-pay

## 2021-01-03 ENCOUNTER — Ambulatory Visit (INDEPENDENT_AMBULATORY_CARE_PROVIDER_SITE_OTHER): Payer: Medicare Other | Admitting: Pulmonary Disease

## 2021-01-03 VITALS — BP 114/74 | HR 63 | Temp 97.3°F | Ht 68.0 in | Wt 138.7 lb

## 2021-01-03 DIAGNOSIS — J449 Chronic obstructive pulmonary disease, unspecified: Secondary | ICD-10-CM | POA: Diagnosis not present

## 2021-01-03 MED ORDER — PREDNISONE 10 MG PO TABS: 10.0000 mg | ORAL_TABLET | Freq: Every day | ORAL | 0 refills | Status: DC

## 2021-01-03 MED ORDER — AZITHROMYCIN 250 MG PO TABS: 250.0000 mg | ORAL_TABLET | Freq: Every day | ORAL | 0 refills | Status: DC

## 2021-01-03 NOTE — Patient Instructions (Signed)
I am glad you are doing well with your breathing Continue current management  We will call in prednisone 40 mg a day for 5 days and Z-Pak to be held and resolved in case she has an exacerbation during her trip out of state  Follow-up in 6 months

## 2021-01-03 NOTE — Progress Notes (Signed)
Anne Rich    [id number]    1951-04-15  Primary Care Physician:[name], Opal Sidles  Referring Physician: Doreen Salvage, PA-C [address] [place],  [place] [address]  Chief complaint: Follow-up for COPD, asthma overlap syndrome  HPI: 70 year old with history of severe persistent asthma, COPD overlap syndrome with chronic hypoxic respiratory failure on 2 L oxygen. Previously followed by Dr. Chestine Spore and Dr. [name] at the pulmonary office. She is on Xolair since about 2015 which she gets through Dr. 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 She has been on several inhalers in the past but current regimen includes nebulizers including Brovana, Pulmicort, Yupelri and Daliresp p.o. she is on supplemental oxygen. Hospitalized with COVID-19 in January 2021 for 4 days. Given remdesivir and steroids.   Complains of chronic dyspnea on exertion, cough with white mucus  Pets: No pets Occupation: Retired Exposures: Reports seeing mold at home. No hot tub, Jacuzzi or feather pillows or comforters Smoking history: Smoked as a teenager. 8-pack-year smoking history Travel history: Previously lived in [place] in [place] Relevant family history: No significant family history of lung disease  Interim history: Continues to do well on current regimen.  Now on chronic prednisone at 10 mg which was started by Dr. Dellis Anes.  This has reduced the number of exacerbations  Has chronic dyspnea on exertion, no cough, sputum production, fevers, chills  She is planning to get married later this week and is going on a trip to [place] and [place] to visit family  Outpatient Encounter Medications as of 01/03/2021  Medication Sig   albuterol (PROAIR HFA) 108 (90 Base) MCG/ACT inhaler Inhale 2 puffs into the lungs every 4 (four) hours as  needed for wheezing or shortness of breath.   albuterol (VENTOLIN HFA) 108 (90 Base) MCG/ACT inhaler INHALE 2 PUFFS INTO THE LUNGS EVERY 4 HOURS AS NEEDED FOR WHEEZE OR FOR SHORTNESS OF BREATH   arformoterol (BROVANA) 15 MCG/2ML NEBU Take 2 mLs (15 mcg total) by nebulization 2 (two) times daily.   aspirin 81 MG tablet Take 81 mg by mouth daily.   b complex vitamins capsule Take 1 capsule by mouth daily.   budesonide (PULMICORT) 0.5 MG/2ML nebulizer solution USE 1 VIAL  IN  NEBULIZER TWICE  DAILY - rinse mouth after treatment   carbamazepine (TEGRETOL) 200 MG tablet Take 200-400 mg by mouth 2 (two) times daily. 200mg  tab in the morning and 400mg  at night   cyclobenzaprine (FLEXERIL) 5 MG tablet Take 5 mg by mouth daily.   DALIRESP 500 MCG TABS tablet TAKE 1 TABLET BY MOUTH DAILY   diclofenac sodium (VOLTAREN) 1 % GEL Apply 2 g topically 4 (four) times daily as needed (knee pain).    dicyclomine (BENTYL) 10 MG capsule Take 10 mg by mouth 4 (four) times daily -  before meals and at bedtime.    diphenoxylate-atropine (LOMOTIL) 2.5-0.025 MG tablet Take 1 tablet by mouth 4 (four) times daily as needed for diarrhea or loose stools.   EPINEPHrine (EPIPEN 2-PAK) 0.3 mg/0.3 mL IJ SOAJ injection Inject 0.3 mg into the muscle Once PRN.   estradiol (ESTRACE) 1 MG tablet Take 1 mg by mouth daily.   fluocinonide cream (LIDEX) 0.05 % Apply 1 application topically 2 (two) times daily.   gabapentin (NEURONTIN) 600 MG tablet Take  600-1,200 mg by mouth 2 (two) times daily. 600mg  in the morning and 1200mg  at bedtime   ipratropium (ATROVENT) 0.02 % nebulizer solution TAKE 2.5 MLS (0.5 MG TOTAL) BY NEBULIZATION 4 (FOUR) TIMES DAILY.   levalbuterol (XOPENEX HFA) 45 MCG/ACT inhaler INHALE 2 PUFFS INTO THE LUNGS EVERY 4 HOURS AS NEEDED FOR WHEEZE   levalbuterol (XOPENEX) 0.63 MG/3ML nebulizer solution Take 3 mLs (0.63 mg total) by nebulization every 4 (four) hours as needed. Mix with Ipratropium.   levETIRAcetam (KEPPRA)  1000 MG tablet Take 1,000-2,000 mg by mouth 2 (two) times daily. 1000mg  in the morning and 2000mg  at night   levocetirizine (XYZAL) 5 MG tablet TAKE 1 TABLET (5 MG TOTAL) BY MOUTH DAILY AS NEEDED FOR ALLERGIES (FOR RUNNY NOSE).   lidocaine (LIDODERM) 5 % Place 1 patch onto the skin daily.    lisinopril (PRINIVIL,ZESTRIL) 10 MG tablet Take 10 mg by mouth daily.    LORazepam (ATIVAN) 1 MG tablet Take 1 mg by mouth 3 (three) times daily.   meclizine (ANTIVERT) 25 MG tablet Take 25 mg by mouth 2 (two) times daily as needed for dizziness or nausea.    Melatonin 10 MG TABS Take 10 mg by mouth daily.   mirtazapine (REMERON) 30 MG tablet Take 30 mg by mouth every evening.    montelukast (SINGULAIR) 10 MG tablet TAKE 1 TABLET BY MOUTH EVERYDAY AT BEDTIME   Multiple Vitamin (MULTIVITAMIN) tablet Take 1 tablet by mouth daily.    omeprazole (PRILOSEC) 40 MG capsule Take 40 mg by mouth 2 (two) times a day.   ondansetron (ZOFRAN) 4 MG tablet Take 1 tablet by mouth every 8 (eight) hours as needed.   OXYGEN Inhale 3 L into the lungs at bedtime. 4 L when walking   polyvinyl alcohol (LIQUIFILM TEARS) 1.4 % ophthalmic solution Place 1 drop into both eyes as needed for dry eyes.   predniSONE (DELTASONE) 10 MG tablet TAKE 1 TABLET (10 MG TOTAL) BY MOUTH DAILY WITH BREAKFAST.   predniSONE (DELTASONE) 20 MG tablet Take 2 tablets (40 mg total) by mouth daily with breakfast.   promethazine-dextromethorphan (PROMETHAZINE-DM) 6.25-15 MG/5ML syrup Take 2.5 mLs by mouth 4 (four) times daily as needed for cough.   revefenacin (YUPELRI) 175 MCG/3ML nebulizer solution Inhale 3 mLs (175 mcg total) into the lungs daily.   sodium chloride (OCEAN) 0.65 % SOLN nasal spray Place 1 spray into both nostrils as needed for congestion.   Facility-Administered Encounter Medications as of 01/03/2021  Medication   omalizumab ) injection 150 mg   Physical Exam: Blood pressure 114/74, pulse 63, temperature (!) 97.3 F (36.3 C),  temperature source Oral, height 5\' 8"  (1.727 m), weight 138 lb 11.2 oz (62.9 kg), SpO2 92 %. Gen:      No acute distress HEENT:  EOMI, sclera anicteric Neck:     No masses; no thyromegaly Lungs:    Clear to auscultation bilaterally; normal respiratory effort CV:         Regular rate and rhythm; no murmurs Abd:      + bowel sounds; soft, non-tender; no palpable masses, no distension Ext:    No edema; adequate peripheral perfusion Skin:      Warm and dry; no rash Neuro: alert and oriented x 3 Psych: normal mood and affect   Data Reviewed:  Imaging: CT chest 06/20/19-mild patchy groundglass opacities, left base consolidation, severe emphysema, coronary atherosclerosis  Chest x-ray 06/03/2020-emphysematous changes, hyperinflation I have reviewed the images personally  PFTs: Spirometry 03/14/20 FVC  1.60 [45%], FEV1 0.66 [25%], F/F 0.41 Severe obstruction  Labs: CBC 06/21/19-WBC 9.9, eos 0%  Assessment:  Severe COPD, asthma overlap syndrome Continue LABA, LAMA, ICS nebulizers On Daliresp Getting Xolair through the allergy office On chronic prednisone at 10 mg.  She will follow-up with primary care to get bone density check  She is about to go on a trip and is worried about exacerbation.  We will give her Z-Pak and prednisone to be held in reserve  Post COVID-19 CT on admission with mild inflammatory changes in January. She appears to have recovered back to baseline Follow-up chest x-ray with no interstitial changes  Plan/Recommendations: Continue bronchodilators, inhaled corticosteroid, Daliresp Asthma treatment with Xolair, Singulair, chronic prednisone Z-Pak and prednisone to be held in reserve in case of exacerbation  Chilton Greathouse MD [facility] 01/03/2021, 9:14 AM  CC: Doreen Salvage, PA-C

## 2021-01-06 ENCOUNTER — Telehealth: Payer: Self-pay | Admitting: Allergy & Immunology

## 2021-01-06 MED ORDER — AZELASTINE HCL 0.1 % NA SOLN: 1.0000 | Freq: Two times a day (BID) | NASAL | 3 refills | Status: DC | PRN

## 2021-01-06 MED ORDER — AZELASTINE HCL 0.1 % NA SOLN: 1.0000 | Freq: Two times a day (BID) | NASAL | 5 refills | Status: DC | PRN

## 2021-01-06 NOTE — Addendum Note (Signed)
Addended by: Janie Morning on: 01/06/2021 05:02 PM   Modules accepted: Orders

## 2021-01-06 NOTE — Telephone Encounter (Signed)
Recommend that she get covid-19 testing.  She can take an additional antihistamines if she wants to such as Zyrtec (cetirizine), Claritin (loratadine), Allegra (fexofenadine), or Xyzal (levocetirizine).   Will send in Rx for: azelastine nasal spray 1-2 sprays per nostril twice a day as needed for runny nose/drainage.  Make sure she is taking her asthma/copd medications.   If she has acute worsening of her symptoms recommend to go to ER/urgent care for further evaluation.

## 2021-01-06 NOTE — Telephone Encounter (Signed)
Spoke with patient and gave message from Dr. Selena Batten. Azelastine nasal spray sent to [facility] pharmacy. She verbalized understanding and has no additional questions.

## 2021-01-06 NOTE — Telephone Encounter (Signed)
Patient is stating she is experiencing watery eyes and Running nose needs advice please advise

## 2021-01-06 NOTE — Telephone Encounter (Signed)
Patient has been moving and got into a lot of dust. She is having watery eyes, nose and sore throat since yesterday. She doesn't want congestion to go to her lungs and get pneumonia. No COVID test taken. Please advise.

## 2021-01-11 DIAGNOSIS — J4540 Moderate persistent asthma, uncomplicated: Secondary | ICD-10-CM | POA: Diagnosis not present

## 2021-01-12 ENCOUNTER — Encounter: Payer: Self-pay | Admitting: Allergy & Immunology

## 2021-01-12 ENCOUNTER — Ambulatory Visit (INDEPENDENT_AMBULATORY_CARE_PROVIDER_SITE_OTHER): Payer: Medicare Other

## 2021-01-12 ENCOUNTER — Other Ambulatory Visit: Payer: Self-pay

## 2021-01-12 ENCOUNTER — Ambulatory Visit (INDEPENDENT_AMBULATORY_CARE_PROVIDER_SITE_OTHER): Payer: Medicare Other | Admitting: Allergy & Immunology

## 2021-01-12 VITALS — BP 114/56 | HR 86 | Temp 97.2°F | Resp 20

## 2021-01-12 DIAGNOSIS — J449 Chronic obstructive pulmonary disease, unspecified: Secondary | ICD-10-CM | POA: Diagnosis not present

## 2021-01-12 DIAGNOSIS — J4540 Moderate persistent asthma, uncomplicated: Secondary | ICD-10-CM | POA: Diagnosis not present

## 2021-01-12 DIAGNOSIS — K219 Gastro-esophageal reflux disease without esophagitis: Secondary | ICD-10-CM | POA: Diagnosis not present

## 2021-01-12 DIAGNOSIS — J3089 Other allergic rhinitis: Secondary | ICD-10-CM

## 2021-01-12 NOTE — Patient Instructions (Addendum)
1. Moderate persistent asthma with COPD overlap - I am so glad that you are doing so well.  - Congrats on the marriage (make sure that he treats you well) - Lung testing looked GREAT today at 32%! This is the highest one!  - Continue with oxygen as needed.  - Daily controller medication(s): prednisone 10mg  daily + Pulmicort (budesonide) + Brovana twice daily via nebulizer + Yupelri once daily via nebulizer + albuterol twice daily via nebulizer + Dalisrep daily + Xolair every two weeks  - Rescue medications: Xopenex (levalbuterol) + Atrovent (ipratropium) mixed together every 4-6 hours as needed via nebulizer - Asthma control goals:  * Full participation in all desired activities (may need albuterol before activity) * Albuterol use two time or less a week on average (not counting use with activity) * Cough interfering with sleep two time or less a month * Oral steroids no more than once a year * No hospitalizations  2. Allergic rhinitis - did allergy shots for three years - Continue with montelukast 10mg  daily.  - Continue with levocetirizine 5mg  daily.   3. Return in about 4 months (around 05/14/2021).    Please inform of any Emergency Department visits, hospitalizations, or changes in symptoms. Call before going to the ED for breathing or allergy symptoms since we might be able to fit you in for a sick visit. Feel free to contact 14/09/2020 anytime with any questions, problems, or concerns.  It was a pleasure to see you again today!  Websites that have reliable patient information: 1. American Academy of Asthma, Allergy, and Immunology: www.aaaai.org 2. Food Allergy Research and Education (FARE): foodallergy.org 3. Mothers of Asthmatics: http://www.asthmacommunitynetwork.org 4. American College of Allergy, Asthma, and Immunology: www.acaai.org   COVID-19 Vaccine Information can be found at: [place] For  questions related to vaccine distribution or appointments, please email [email or web address] or call [phone].   We realize that you might be concerned about having an allergic reaction to the COVID19 vaccines. To help with that concern, WE ARE OFFERING THE COVID19 VACCINES IN OUR OFFICE! Ask the front desk for dates!     "Like" [place] on Facebook and Instagram for our latest updates!      A healthy democracy works best when [email or web address] participate! Make sure you are registered to vote! If you have moved or changed any of your contact information, you will need to get this updated before voting!  In some cases, you MAY be able to register to vote online: [phone]
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
## 2021-01-14 ENCOUNTER — Encounter: Payer: Self-pay | Admitting: Allergy & Immunology

## 2021-01-24 ENCOUNTER — Ambulatory Visit: Payer: Self-pay

## 2021-01-29 ENCOUNTER — Other Ambulatory Visit: Payer: Self-pay | Admitting: Allergy & Immunology

## 2021-01-30 ENCOUNTER — Other Ambulatory Visit: Payer: Self-pay

## 2021-01-30 DIAGNOSIS — J4540 Moderate persistent asthma, uncomplicated: Secondary | ICD-10-CM

## 2021-01-30 MED ORDER — LEVALBUTEROL HCL 0.63 MG/3ML IN NEBU: 0.6300 mg | INHALATION_SOLUTION | RESPIRATORY_TRACT | 1 refills | Status: DC | PRN

## 2021-01-30 NOTE — Telephone Encounter (Signed)
Spoke to patient this morning because she usually has her xopenex hfa sent in, but she wanted her albuterol hfa sent in and we sent in xopenex 0.63 mg for her nebulizer as well.

## 2021-01-31 ENCOUNTER — Other Ambulatory Visit: Payer: Self-pay

## 2021-01-31 ENCOUNTER — Ambulatory Visit (INDEPENDENT_AMBULATORY_CARE_PROVIDER_SITE_OTHER): Payer: Medicare Other

## 2021-01-31 DIAGNOSIS — J4540 Moderate persistent asthma, uncomplicated: Secondary | ICD-10-CM

## 2021-02-14 ENCOUNTER — Telehealth: Payer: Self-pay | Admitting: Pulmonary Disease

## 2021-02-14 MED ORDER — PREDNISONE 10 MG PO TABS: ORAL_TABLET | ORAL | 0 refills | Status: DC

## 2021-02-14 NOTE — Telephone Encounter (Signed)
Patient is aware of below recommendations and voiced her understanding. Patient is on maintenance dose of 10mg  daily.  Rx sent to preferred pharmacy.  Nothing further needed at this time.

## 2021-02-14 NOTE — Telephone Encounter (Signed)
Called and spoke with patient. She verbalized understanding. She is hesitant to take the 40mg  of prednisone. She wants to know if it could be a taper instead.   Dr. , can you please advise? Thanks.

## 2021-02-14 NOTE — Telephone Encounter (Signed)
Please call in prednisone 40 mg/day for 5 days.  Can try delsym otc for cough

## 2021-02-14 NOTE — Telephone Encounter (Signed)
Pred taper. Start at 40 mg and reduce dose by 10 mg every day

## 2021-02-14 NOTE — Telephone Encounter (Signed)
Primary Pulmonologist: Mannam Last office visit and with whom: 01/03/2021 Mannam What do we see them for (pulmonary problems): Asthma-COPD overlap syndrome Last OV assessment/plan:  Assessment:  Severe COPD, asthma overlap syndrome Continue LABA, LAMA, ICS nebulizers On Daliresp Getting Xolair through the allergy office On chronic prednisone at 10 mg.  She will follow-up with primary care to get bone density check   She is about to go on a trip and is worried about exacerbation.  We will give her Z-Pak and prednisone to be held in reserve   Post COVID-19 CT on admission with mild inflammatory changes in January. She appears to have recovered back to baseline Follow-up chest x-ray with no interstitial changes   Plan/Recommendations: Continue bronchodilators, inhaled corticosteroid, Daliresp Asthma treatment with Xolair, Singulair, chronic prednisone Z-Pak and prednisone to be held in reserve in case of exacerbation   Chilton Greathouse MD [facility] 01/03/2021, 9:14 AM   CC: Anne Salvage, PA-C          Patient Instructions by Chilton Greathouse, MD at 01/03/2021 9:15 AM  Author: Chilton Greathouse, MD Author Type: Physician Filed: 01/03/2021  9:31 AM  Note Status: Signed Cosign: Cosign Not Required Encounter Date: 01/03/2021  Editor: Chilton Greathouse, MD (Physician)               I am glad you are doing well with your breathing Continue current management  We will call in prednisone 40 mg a day for 5 days and Z-Pak to be held and resolved in case she has an exacerbation during her trip out of state  Follow-up in 6 months       Orthostatic Vitals Recorded in This Encounter   01/03/2021  0908     BP Location: Right Arm  Cuff Size: Normal   Instructions  I am glad you are doing well with your breathing Continue current management  We will call in prednisone 40 mg a day for 5 days and Z-Pak to be held and resolved in case she has an exacerbation during  her trip out of state  Follow-up in 6 months      Reason for call: Patient reports sob and cough for that started on 02/13/2021.  Reports coughing up greenish mucous.  This happens with increase of humidity.  No fever, chills or body aches.  Albuterol inhaler helps when walking.  On 4L of oxygen, sats are 97%.  Wheezing at times, comes and goes if she over does it.  She is requesting prednisone and cough syrup.  She states she did not take the azithromycin prescribed in July to have on hand for her trip and that it does not do much anyway.  Dr. Isaiah Serge, please advise.  Thank you.  (examples of things to ask: : When did symptoms start? Fever? Cough? Productive? Color to sputum? More sputum than usual? Wheezing? Have you needed increased oxygen? Are you taking your respiratory medications? What over the counter measures have you tried?)  Allergies  Allergen Reactions   Iodine Itching, Rash and Swelling    IV contrast    Iodine-131 Itching and Swelling    Pt states causes severe itching Pt states causes severe itching    Penicillins Hives and Swelling    Swelling of the throat   Pineapple Other (See Comments) and Swelling    Pt states causes her throat swelling Pt states causes her throat swelling    Shellfish Allergy Rash and Swelling    Had reaction to cardiac  cath dye  Had seizure ,rash ,itch Oct. 2012 Had reaction to cardiac cath dye  Had seizure ,rash ,itch Oct. 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

## 2021-02-15 ENCOUNTER — Ambulatory Visit (INDEPENDENT_AMBULATORY_CARE_PROVIDER_SITE_OTHER): Payer: Medicare Other

## 2021-02-15 ENCOUNTER — Other Ambulatory Visit: Payer: Self-pay

## 2021-02-15 DIAGNOSIS — J4540 Moderate persistent asthma, uncomplicated: Secondary | ICD-10-CM

## 2021-02-28 DIAGNOSIS — J4540 Moderate persistent asthma, uncomplicated: Secondary | ICD-10-CM

## 2021-03-01 ENCOUNTER — Ambulatory Visit (INDEPENDENT_AMBULATORY_CARE_PROVIDER_SITE_OTHER): Payer: Medicare Other

## 2021-03-01 ENCOUNTER — Other Ambulatory Visit: Payer: Self-pay

## 2021-03-01 DIAGNOSIS — J4540 Moderate persistent asthma, uncomplicated: Secondary | ICD-10-CM

## 2021-03-02 ENCOUNTER — Other Ambulatory Visit: Payer: Self-pay | Admitting: Allergy & Immunology

## 2021-03-12 ENCOUNTER — Other Ambulatory Visit: Payer: Self-pay | Admitting: Family Medicine

## 2021-03-15 ENCOUNTER — Ambulatory Visit: Payer: Medicare Other

## 2021-03-15 DIAGNOSIS — J4540 Moderate persistent asthma, uncomplicated: Secondary | ICD-10-CM | POA: Diagnosis not present

## 2021-03-16 ENCOUNTER — Other Ambulatory Visit: Payer: Self-pay

## 2021-03-16 ENCOUNTER — Other Ambulatory Visit: Payer: Self-pay | Admitting: Allergy & Immunology

## 2021-03-16 ENCOUNTER — Ambulatory Visit (INDEPENDENT_AMBULATORY_CARE_PROVIDER_SITE_OTHER): Payer: Medicare Other

## 2021-03-16 DIAGNOSIS — J4540 Moderate persistent asthma, uncomplicated: Secondary | ICD-10-CM

## 2021-03-16 NOTE — Telephone Encounter (Signed)
Patient is due back for an office visit December of 2022. Refill has been sent in.

## 2021-03-24 ENCOUNTER — Other Ambulatory Visit: Payer: Self-pay | Admitting: Allergy & Immunology

## 2021-03-30 ENCOUNTER — Ambulatory Visit: Payer: Medicare Other

## 2021-04-09 ENCOUNTER — Other Ambulatory Visit: Payer: Self-pay | Admitting: Allergy & Immunology

## 2021-04-10 DIAGNOSIS — J4540 Moderate persistent asthma, uncomplicated: Secondary | ICD-10-CM

## 2021-04-11 ENCOUNTER — Ambulatory Visit (INDEPENDENT_AMBULATORY_CARE_PROVIDER_SITE_OTHER): Payer: Medicare Other

## 2021-04-11 ENCOUNTER — Other Ambulatory Visit: Payer: Self-pay

## 2021-04-11 DIAGNOSIS — J4540 Moderate persistent asthma, uncomplicated: Secondary | ICD-10-CM | POA: Diagnosis not present

## 2021-04-11 MED ORDER — LEVALBUTEROL HCL 0.63 MG/3ML IN NEBU: INHALATION_SOLUTION | RESPIRATORY_TRACT | 1 refills | Status: DC

## 2021-04-11 MED ORDER — ALBUTEROL SULFATE HFA 108 (90 BASE) MCG/ACT IN AERS: INHALATION_SPRAY | RESPIRATORY_TRACT | 1 refills | Status: DC

## 2021-04-11 NOTE — Telephone Encounter (Signed)
Refills for levalbuterol 0.63% and Albuterol HFA x 1 with 1 refill at [facility].

## 2021-04-18 ENCOUNTER — Other Ambulatory Visit: Payer: Self-pay | Admitting: Allergy & Immunology

## 2021-04-18 NOTE — Telephone Encounter (Signed)
Is the patient on chronic prednisone use?

## 2021-04-24 DIAGNOSIS — J4550 Severe persistent asthma, uncomplicated: Secondary | ICD-10-CM | POA: Diagnosis not present
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
## 2021-04-24 NOTE — Patient Instructions (Addendum)
Moderate persistent asthma with COPD overlap:  - Breathing tests look a little worse today at 27% - Recommend trying to go back to twice a day Brovana if tolerated  - Will increase Prednisone 15mg  daily  - Continue with oxygen as needed.  - Continue to follow up with Pulmonary  - Daily controller medication(s): prednisone 15mg  daily + Pulmicort (budesonide) + Brovana twice daily via nebulizer + Yupelri once daily via nebulizer + albuterol twice daily via nebulizer + Dalisrep daily + Xolair every two weeks  - Rescue medications: Xopenex (levalbuterol) + Atrovent (ipratropium) mixed together every 4-6 hours as needed via nebulizer - Asthma control goals:  * Full participation in all desired activities (may need albuterol before activity) * Albuterol use two time or less a week on average (not counting use with activity) * Cough interfering with sleep two time or less a month * Oral steroids no more than once a year * No hospitalizations *Minimize medical procedures    2. Allergic rhinitis - did allergy shots for three years, now off medication.  - Continue with montelukast 10mg  daily.  - Continue with levocetirizine 5mg  daily  3. Chronic Prednisone Use  - Offered a DEXA scan, but patient refused.    4. Anxiety/Depression  - Recommend continue work with counselors, PCP and .  - We are here for you if you need help or further referrals   Follow up in 2 months   Thank you so much for letter me partake in your care today.  Don't hesitate to reach out if you have any additional concerns!  , MD  [facility] , [place]   -

## 2021-04-25 ENCOUNTER — Encounter: Payer: Self-pay | Admitting: Internal Medicine

## 2021-04-25 ENCOUNTER — Ambulatory Visit: Payer: Medicare Other | Admitting: Internal Medicine

## 2021-04-25 ENCOUNTER — Other Ambulatory Visit: Payer: Self-pay

## 2021-04-25 ENCOUNTER — Ambulatory Visit (INDEPENDENT_AMBULATORY_CARE_PROVIDER_SITE_OTHER): Payer: Medicare Other

## 2021-04-25 VITALS — BP 132/78 | HR 92

## 2021-04-25 DIAGNOSIS — J4550 Severe persistent asthma, uncomplicated: Secondary | ICD-10-CM | POA: Diagnosis not present

## 2021-04-25 DIAGNOSIS — J449 Chronic obstructive pulmonary disease, unspecified: Secondary | ICD-10-CM

## 2021-04-25 DIAGNOSIS — K219 Gastro-esophageal reflux disease without esophagitis: Secondary | ICD-10-CM

## 2021-04-25 DIAGNOSIS — J3089 Other allergic rhinitis: Secondary | ICD-10-CM

## 2021-04-25 MED ORDER — PREDNISONE 10 MG PO TABS
15.0000 mg | ORAL_TABLET | Freq: Every day | ORAL | 3 refills | Status: DC
Start: 2021-04-25 — End: 2022-03-19

## 2021-04-27 ENCOUNTER — Telehealth: Payer: Self-pay | Admitting: Allergy & Immunology

## 2021-04-27 NOTE — Telephone Encounter (Signed)
Pt request a higher dose of prednisone or a call back to discuss her symptoms

## 2021-04-27 NOTE — Telephone Encounter (Signed)
Informed pt of this and she will reach out to pulmonology about it

## 2021-04-27 NOTE — Telephone Encounter (Signed)
Very tight in chest, she is doing the 15mg , she was wondering if she could have a step therapy

## 2021-04-27 NOTE — Telephone Encounter (Signed)
Please let Areona know that I feel that this should be discussed with by her pulmonologist due to her end stage COPD

## 2021-04-28 ENCOUNTER — Telehealth: Payer: Self-pay | Admitting: Pulmonary Disease

## 2021-04-28 MED ORDER — PREDNISONE 10 MG PO TABS: ORAL_TABLET | ORAL | 0 refills | Status: AC

## 2021-04-28 NOTE — Telephone Encounter (Signed)
I called the patient back and she reports that she is having more Wheezing than normal and she was placed on 15 mg for a couple of days. She has  more tightness in the chest and is negative for Covid.   She wants to know if she can up her Prednisolone to 40 mg for a couple of days and work her way down like a taper for a couple of days. She has felt that it has worked in the past.She reports the 15 mg increase for a couple of days did not work and she is back on 10 mg a day. Please advise if ok to do a prednisone taper?

## 2021-04-28 NOTE — Telephone Encounter (Signed)
Call made to patient, confirmed DOB. Made aware of PM recommendations. Voiced understanding. She asked that we send in the additional dose to the pharmacy. Confirmed pharmacy. Medication sent in.   Nothing further needed at this time.

## 2021-04-28 NOTE — Telephone Encounter (Signed)
The patient called back and she is waiting on her regular refill from the pharmacy and she can pick up the taper pack and then fill the regular prednisone. She did not have any questions.

## 2021-04-28 NOTE — Addendum Note (Signed)
Addended by: Arvilla Market on: 04/28/2021 04:53 PM   Modules accepted: Orders

## 2021-04-28 NOTE — Telephone Encounter (Signed)
Okay to increase prednisone to 40 mg a day for 3 days.  Reduce dose by 10 mg every 2 days until she is back to her baseline dose of 10 mg If no improvement then she will need a follow-up visit

## 2021-04-28 NOTE — Telephone Encounter (Signed)
Pt tested for covid 5 days ago- negative. Allergist upped prednisone from 10mg  to 15mg . Pt wanting Mannam to up prednisone to 40mg  and wean pt back down to 15. Pt states the cold weather effects her breathing and is unsure if upping the prednisone will help. Please advise [phone]

## 2021-05-09 ENCOUNTER — Ambulatory Visit: Payer: Medicare Other

## 2021-05-10 ENCOUNTER — Ambulatory Visit: Payer: Medicare Other

## 2021-05-10 ENCOUNTER — Telehealth: Payer: Self-pay | Admitting: *Deleted

## 2021-05-10 NOTE — Telephone Encounter (Signed)
Office notes from that date have been faxed to the number provided.

## 2021-05-10 NOTE — Telephone Encounter (Signed)
[facility] called needing updated office visit notes that have Yupelri stated on them. They need that before refilling medication. Their fax number is [phone]. Ashleigh could you please print notes from 04/25/21 OV and fax them since our fax is not working yet. Thank you!

## 2021-05-18 ENCOUNTER — Telehealth: Payer: Self-pay | Admitting: Pulmonary Disease

## 2021-05-18 MED ORDER — PREDNISONE 20 MG PO TABS: 40.0000 mg | ORAL_TABLET | Freq: Every day | ORAL | 0 refills | Status: DC

## 2021-05-18 MED ORDER — MOLNUPIRAVIR EUA 200MG CAPSULE: 4.0000 | ORAL_CAPSULE | Freq: Two times a day (BID) | ORAL | 0 refills | Status: AC

## 2021-05-18 NOTE — Telephone Encounter (Signed)
Noted- I would like to avoid paxlovid as she is on multiple meds with possible interactions  I have sent in a prescription for Molnupiravir 800 mg every 12 hours for 5 days Increase prednisone to 40mg /day for 5 days.

## 2021-05-18 NOTE — Telephone Encounter (Signed)
Called by [name] who relates that her husband was hospitalized with COVID last night and she has tested positive for COVID via a home test last night. She says she has terrible diarrhea. 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 I offered to call in a prescription for Paxlovid. However, the only pharmacy that she can get to is not open until 9 AM. She was instructed to call the PCCM office at 9 AM to get a COVID medication prescription sent to her pharmacy. Should her respiratory status deteriorate, she was instructed to come to the emergency department.

## 2021-05-18 NOTE — Telephone Encounter (Signed)
Called and spoke with pt letting her know the recs per Dr. Isaiah Serge and she verbalized understanding. Pt said that she did not have enough pred to double up so I stated to her that I would send in an Rx for prednisone. Rx for 20mg  prednisone has been sent to pharmacy for pt with instructions to take 2 tabs daily for 5 days and Dr. sent Rx for mulnupiravir for pt. Nothing further needed.

## 2021-05-18 NOTE — Telephone Encounter (Signed)
Called and spoke with pt who stated she tested positive for covid yesterday 12/7. Pt has had symptoms including sore throat, dry cough, and has also been feeling weak. Pt said she did have diarrhea bad this morning 12/8 and took imodium which did help with that.  Pt said that her husband also has covid and is currently in the hospital.  Due to her symptoms and due to testing positive for covid, pt wants to know what can be recommended. Dr. Isaiah Serge, please advise.

## 2021-05-19 ENCOUNTER — Telehealth: Payer: Self-pay | Admitting: Internal Medicine

## 2021-05-19 NOTE — Telephone Encounter (Signed)
Please let her know that if she feels her breathing is worsening, or she is tired from her breathing, she should go immediately to the emergency room. She should continue on the 30 mg of prednisone for the next week.  And the other medication prescribed for COVID.  Continue other medications as prescribed by Dr. Marlynn Perking.

## 2021-05-19 NOTE — Telephone Encounter (Signed)
Pt states she has covid, would like a call back. She is scared.

## 2021-05-19 NOTE — Telephone Encounter (Signed)
Informed pt of what Dr Maurine Minister stated she wil monitor breathing and do current meds and give [place] an update in a day or two

## 2021-05-19 NOTE — Telephone Encounter (Signed)
50mg  prednisone two days this week. But she is now doing 30 mg. She is doing lagevrio molnupiradir 200mg  4 tablets bid. Breathing wise she is ok other then in the mornings when she first wakes up. she is using her oxygen. 92% is oxygen. She is doing all other meds. She is tired, and is scared.

## 2021-05-22 NOTE — Telephone Encounter (Signed)
PT  asks if she is still able to come to her appt tomorrow?

## 2021-05-23 ENCOUNTER — Encounter: Payer: Self-pay | Admitting: Internal Medicine

## 2021-05-23 ENCOUNTER — Ambulatory Visit (INDEPENDENT_AMBULATORY_CARE_PROVIDER_SITE_OTHER): Payer: Medicare Other | Admitting: Internal Medicine

## 2021-05-23 ENCOUNTER — Other Ambulatory Visit: Payer: Self-pay

## 2021-05-23 VITALS — BP 124/70 | HR 98 | Temp 98.1°F | Resp 20

## 2021-05-23 DIAGNOSIS — J449 Chronic obstructive pulmonary disease, unspecified: Secondary | ICD-10-CM | POA: Diagnosis not present

## 2021-05-23 DIAGNOSIS — F419 Anxiety disorder, unspecified: Secondary | ICD-10-CM

## 2021-05-23 DIAGNOSIS — J4540 Moderate persistent asthma, uncomplicated: Secondary | ICD-10-CM

## 2021-05-23 DIAGNOSIS — J3089 Other allergic rhinitis: Secondary | ICD-10-CM

## 2021-05-23 DIAGNOSIS — F320 Major depressive disorder, single episode, mild: Secondary | ICD-10-CM | POA: Insufficient documentation

## 2021-05-23 DIAGNOSIS — U071 COVID-19: Secondary | ICD-10-CM

## 2021-05-23 NOTE — Patient Instructions (Addendum)
Moderate persistent asthma with COPD overlap:  - Breathing tests not done today due recent Covid 19 infection  - Continue with oxygen as needed.  - Continue to follow up with Pulmonary regarding prednisone use  - Daily controller medication(s): prednisone 10mg  daily + Pulmicort (budesonide) + Brovana twice daily via nebulizer + Yupelri once daily via nebulizer + albuterol twice daily via nebulizer + Dalisrep daily + Xolair every two weeks  - Rescue medications: Xopenex (levalbuterol) + Atrovent (ipratropium) mixed together every 4-6 hours as needed via nebulizer - Asthma control goals:  * Full participation in all desired activities (may need albuterol before activity) * Albuterol use two time or less a week on average (not counting use with activity) * Cough interfering with sleep two time or less a month * Oral steroids no more than once a year * No hospitalizations *Minimize medical procedures   COVID 19 Infection:  - Continue pulmonary recommendations regarding prednisone use - Finished mulnupiravir on 12/12 - Go to the ED if you get tired or breathing gets worse    Allergic rhinitis - did allergy shots for three years, now off medication.  - Continue with montelukast 10mg  daily.  - Continue with levocetirizine 5mg  daily    Chronic Prednisone Use  - Offered a DEXA scan, but patient refused.     Anxiety/Depression  - Recommend continue work with counselors, PCP and 14/12.  - Look up app for anti-anxiety and meditation  - Look up [employer] app for online talk therapy  - We are here for you if you need help or further referrals    Follow up in 4-6 months    Thank you so much for letter me partake in your care today.  Don't hesitate to reach out if you have any additional concerns!   , MD  [facility] [place], [place]
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
## 2021-05-25 ENCOUNTER — Telehealth (INDEPENDENT_AMBULATORY_CARE_PROVIDER_SITE_OTHER): Payer: Medicare Other | Admitting: Pulmonary Disease

## 2021-05-25 ENCOUNTER — Ambulatory Visit: Payer: Medicare Other | Admitting: Allergy & Immunology

## 2021-05-25 ENCOUNTER — Encounter: Payer: Self-pay | Admitting: Pulmonary Disease

## 2021-05-25 ENCOUNTER — Telehealth: Payer: Self-pay | Admitting: Pulmonary Disease

## 2021-05-25 DIAGNOSIS — J449 Chronic obstructive pulmonary disease, unspecified: Secondary | ICD-10-CM

## 2021-05-25 MED ORDER — PREDNISONE 20 MG PO TABS: ORAL_TABLET | ORAL | 0 refills | Status: DC

## 2021-05-25 MED ORDER — AZITHROMYCIN 250 MG PO TABS: ORAL_TABLET | ORAL | 0 refills | Status: DC

## 2021-05-25 NOTE — Telephone Encounter (Signed)
I called the patient and she has a cough and she wants to do a video visit. I have set her up for a video visit per the patient request. Nothing further needed.
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
## 2021-05-25 NOTE — Patient Instructions (Addendum)
We will call in Z-Pak and prednisone 40 mg a day for 5 days If he gets worse then please go to the emergency room Please call back if you are not any better  Follow-up as scheduled in January

## 2021-06-06 ENCOUNTER — Telehealth: Payer: Self-pay | Admitting: Pulmonary Disease

## 2021-06-06 NOTE — Telephone Encounter (Signed)
Spoke with the pt  She is scheduled rov with Dr Isaiah Serge 06/29/21  She plans to go on a trip before the next visit- will be flying on plane to [place] and then going on a cruise  She wants to know if she should increase her pred while she goes on her trip and also if she can have some antinausea med  She is currently feeling well and denies any co's She is on pred 10 mg daily as maintenance  Please advise thanks!

## 2021-06-07 ENCOUNTER — Ambulatory Visit: Payer: Medicare Other

## 2021-06-07 DIAGNOSIS — J4540 Moderate persistent asthma, uncomplicated: Secondary | ICD-10-CM | POA: Diagnosis not present

## 2021-06-07 MED ORDER — PREDNISONE 10 MG PO TABS: 40.0000 mg | ORAL_TABLET | Freq: Every day | ORAL | 0 refills | Status: AC

## 2021-06-07 MED ORDER — ONDANSETRON HCL 4 MG PO TABS: 4.0000 mg | ORAL_TABLET | Freq: Two times a day (BID) | ORAL | 0 refills | Status: DC | PRN

## 2021-06-07 NOTE — Addendum Note (Signed)
Addended by: Delrae Rend on: 06/07/2021 05:11 PM   Modules accepted: Orders

## 2021-06-07 NOTE — Telephone Encounter (Signed)
Patient wants to make sure it is ok for her to fly?  Please advise.  Thank you.

## 2021-06-07 NOTE — Telephone Encounter (Signed)
No need to increase prednisone unless she is having symptoms We can call in a prescription for 40 mg daily for 5 days which she can pick up and keep with her to use as needed on the trip in case of exacerbation  Call in Zofran 4 mg twice a day as needed p.o. for nausea

## 2021-06-07 NOTE — Telephone Encounter (Signed)
Called and spoke with patient, provided information per Dr. Isaiah Serge.  She verbalized understanding.  Nothing further needed.

## 2021-06-07 NOTE — Telephone Encounter (Signed)
Patient calling and wants to know if she ok to fly

## 2021-06-07 NOTE — Telephone Encounter (Signed)
Yes. She is ok to fly

## 2021-06-08 ENCOUNTER — Other Ambulatory Visit: Payer: Self-pay

## 2021-06-08 ENCOUNTER — Ambulatory Visit (INDEPENDENT_AMBULATORY_CARE_PROVIDER_SITE_OTHER): Payer: Medicare Other

## 2021-06-08 DIAGNOSIS — J4540 Moderate persistent asthma, uncomplicated: Secondary | ICD-10-CM | POA: Diagnosis not present

## 2021-06-19 ENCOUNTER — Other Ambulatory Visit: Payer: Self-pay | Admitting: Allergy & Immunology

## 2021-06-21 DIAGNOSIS — J4540 Moderate persistent asthma, uncomplicated: Secondary | ICD-10-CM | POA: Diagnosis not present

## 2021-06-22 ENCOUNTER — Telehealth: Payer: Self-pay | Admitting: *Deleted

## 2021-06-22 ENCOUNTER — Ambulatory Visit (INDEPENDENT_AMBULATORY_CARE_PROVIDER_SITE_OTHER): Payer: Medicare Other

## 2021-06-22 ENCOUNTER — Other Ambulatory Visit: Payer: Self-pay

## 2021-06-22 DIAGNOSIS — J4540 Moderate persistent asthma, uncomplicated: Secondary | ICD-10-CM | POA: Diagnosis not present

## 2021-06-22 NOTE — Telephone Encounter (Signed)
Patient called and stated that it is getting harder for her to breath and she would like for hospice to be called in, she was wondering if they will give her prednisone if she is gasping for air because she does not want to be alone gasping for air.

## 2021-06-22 NOTE — Telephone Encounter (Signed)
I think we have worked with Pulmonology in the past to get hospice involved.  I am going to forward to Dr. Caren Griffins since I believe they help coordinate this.

## 2021-06-26 ENCOUNTER — Other Ambulatory Visit: Payer: Self-pay | Admitting: Pulmonary Disease

## 2021-06-26 NOTE — Telephone Encounter (Addendum)
Patient called back to check on this message to see if there was any progress. I spoke to [employer]. And she said Dr. Dellis Anes is working on it and I relayed that to the patient. She did not have any other message to add.

## 2021-06-27 NOTE — Telephone Encounter (Signed)
Great - thank you so much, Dr. Vaughan Browner!

## 2021-06-27 NOTE — Telephone Encounter (Signed)
Patient is scheduled OV with Dr. Vaughan Browner 06/29/21 at 2:45pm.

## 2021-06-27 NOTE — Telephone Encounter (Signed)
We can make an appointment in office at next available to discuss and make there referral.

## 2021-06-29 ENCOUNTER — Ambulatory Visit (INDEPENDENT_AMBULATORY_CARE_PROVIDER_SITE_OTHER): Payer: Medicare Other | Admitting: Pulmonary Disease

## 2021-06-29 ENCOUNTER — Encounter: Payer: Self-pay | Admitting: Pulmonary Disease

## 2021-06-29 ENCOUNTER — Other Ambulatory Visit: Payer: Self-pay

## 2021-06-29 DIAGNOSIS — J449 Chronic obstructive pulmonary disease, unspecified: Secondary | ICD-10-CM

## 2021-06-29 MED ORDER — ARFORMOTEROL TARTRATE 15 MCG/2ML IN NEBU: 15.0000 ug | INHALATION_SOLUTION | Freq: Two times a day (BID) | RESPIRATORY_TRACT | 11 refills | Status: DC

## 2021-06-29 MED ORDER — BUDESONIDE 0.5 MG/2ML IN SUSP: RESPIRATORY_TRACT | 3 refills | Status: DC

## 2021-06-29 MED ORDER — IPRATROPIUM BROMIDE 0.02 % IN SOLN: 0.5000 mg | Freq: Four times a day (QID) | RESPIRATORY_TRACT | 3 refills | Status: DC

## 2021-06-29 MED ORDER — LEVALBUTEROL HCL 0.63 MG/3ML IN NEBU: INHALATION_SOLUTION | RESPIRATORY_TRACT | 11 refills | Status: DC

## 2021-06-29 MED ORDER — LORAZEPAM 1 MG PO TABS: 1.0000 mg | ORAL_TABLET | Freq: Three times a day (TID) | ORAL | 0 refills | Status: DC | PRN

## 2021-06-29 MED ORDER — HYDROCODONE BIT-HOMATROP MBR 5-1.5 MG/5ML PO SOLN: 5.0000 mL | Freq: Four times a day (QID) | ORAL | 0 refills | Status: DC | PRN
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
## 2021-06-29 NOTE — Patient Instructions (Signed)
We will call in a referral to palliative care for management of severe lung disease Continue your inhalers I will renew the Xanax; the cough syrup Follow-up in 1 to 2 months.

## 2021-06-30 ENCOUNTER — Telehealth: Payer: Self-pay | Admitting: Pulmonary Disease

## 2021-06-30 MED ORDER — ALPRAZOLAM 0.5 MG PO TABS: 0.2500 mg | ORAL_TABLET | Freq: Every evening | ORAL | 0 refills | Status: DC | PRN

## 2021-06-30 NOTE — Telephone Encounter (Signed)
I ordered the xanax

## 2021-06-30 NOTE — Telephone Encounter (Signed)
Called and spoke with pt. Pt stated that she thought Dr. Isaiah Serge was going to prescribe her Xanax instead he sent Rx for Ativan to the pharmacy for her. Pt said she picked up the Rx for Ativan from the pharmacy but when she got home she realized this was the same thing that she had been on in the past, which she said did not help to control her anxiety.  Pt wanted to know if Dr. Isaiah Serge could switch this to Xanax instead as that was what she thought he was sending in for her. Dr. Isaiah Serge, please advise.

## 2021-06-30 NOTE — Telephone Encounter (Signed)
Noreene Larsson- [facility]: [phone] Calling about the patient's nebulizer prescription, needs clarification on which is needed. Please advise

## 2021-07-03 ENCOUNTER — Encounter: Payer: Self-pay | Admitting: Internal Medicine

## 2021-07-03 ENCOUNTER — Ambulatory Visit (INDEPENDENT_AMBULATORY_CARE_PROVIDER_SITE_OTHER): Payer: Medicare Other | Admitting: Internal Medicine

## 2021-07-03 ENCOUNTER — Other Ambulatory Visit: Payer: Self-pay

## 2021-07-03 VITALS — BP 132/66 | HR 91 | Temp 97.7°F | Resp 18 | Ht 68.0 in | Wt 139.2 lb

## 2021-07-03 DIAGNOSIS — F320 Major depressive disorder, single episode, mild: Secondary | ICD-10-CM

## 2021-07-03 DIAGNOSIS — J3089 Other allergic rhinitis: Secondary | ICD-10-CM | POA: Diagnosis not present

## 2021-07-03 DIAGNOSIS — J449 Chronic obstructive pulmonary disease, unspecified: Secondary | ICD-10-CM

## 2021-07-03 DIAGNOSIS — F419 Anxiety disorder, unspecified: Secondary | ICD-10-CM

## 2021-07-03 MED ORDER — MONTELUKAST SODIUM 10 MG PO TABS: ORAL_TABLET | ORAL | 1 refills | Status: DC

## 2021-07-03 MED ORDER — PREDNISONE 10 MG PO TABS: 10.0000 mg | ORAL_TABLET | Freq: Every day | ORAL | 6 refills | Status: DC

## 2021-07-03 NOTE — Telephone Encounter (Signed)
Called and spoke with patient.  Patient stated she started taking Xanax at night, but did not like the was it made her feel.  Patient questioned which is better to take for anxiety?  Patient stated she spoke with pharmacy who advised Ativan would be better for anxiety, if she is not taking Xanax. Patient is concerned about what to take if she feels anxiety through the day? Patient is concerned palliative care will take her breathing medications away.  Advised patient palliative care was not hospice care.   Message routed to Dr. Isaiah Serge to advise on Xanax and Ativan

## 2021-07-03 NOTE — Patient Instructions (Addendum)
Moderate persistent asthma with COPD overlap:  - Breathing tests today showed FEV1 0.76 (31%) which is great!  - Continue with oxygen as needed.  - Continue to follow up with Pulmonary regarding prednisone use  - Step up to 40mg  daily for 3 days, then 20 mg daily for 2 days then go back to 10mg  daily  -Follow-up with palliative care as initiated by pulmonary - Daily controller medication(s): prednisone 10mg  daily + Pulmicort (budesonide) + Brovana twice daily via nebulizer + Yupelri once daily via nebulizer + albuterol twice daily via nebulizer + Dalisrep daily + Xolair every two weeks  - Rescue medications: Xopenex (levalbuterol) + Atrovent (ipratropium) mixed together every 4-6 hours as needed via nebulizer - Asthma control goals:  * Full participation in all desired activities (may need albuterol before activity) * Albuterol use two time or less a week on average (not counting use with activity) * Cough interfering with sleep two time or less a month * Oral steroids no more than once a year * No hospitalizations *Minimize medical procedures     Allergic rhinitis - on allergy shots  -Continue allergen immunotherapy,  - Continue with montelukast 10mg  daily.  - Continue with levocetirizine 5mg  daily    Chronic Prednisone Use  - Offered a DEXA scan, but patient refused.     Anxiety/Depression  - Recommend continue work with counselors, PCP and .  - Continue xanax, recently refilled by Pulmonary   Follow up in 2 months
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
## 2021-07-04 ENCOUNTER — Other Ambulatory Visit: Payer: Self-pay | Admitting: Internal Medicine

## 2021-07-04 ENCOUNTER — Encounter: Payer: Self-pay | Admitting: Pulmonary Disease

## 2021-07-04 ENCOUNTER — Telehealth: Payer: Self-pay

## 2021-07-04 NOTE — Telephone Encounter (Signed)
Agree with trying Xanax.  I think we are limited in the pulmonary clinic about her ability to deal with anxiety and stress.  We can make a referral to psychiatry or behavioral health who can help with these issues

## 2021-07-04 NOTE — Telephone Encounter (Signed)
Called patient but she did not answer. Left message for her to call us back. Since the message has been closed, I will leave it in triage for follow up.

## 2021-07-04 NOTE — Telephone Encounter (Signed)
Patient is on chronic prednisone.

## 2021-07-04 NOTE — Telephone Encounter (Signed)
(  3:30 pm) SW scheduled a palliative care visit with patient. RN/SW visit scheduled for 07/06/21 @ 12 pm.

## 2021-07-04 NOTE — Telephone Encounter (Signed)
Called and spoke with patient. She stated that she received some news this morning that increased her anxiety. She took 1mg  of Ativan this morning but it has not helped. She was audibly upset on the phone. She wanted to know what else could she do for the anxiety. I encouraged her to take deep, slow breaths and try to relax. I asked if she had the Xanax on hand and she stated yes. I advised her to take 1/2 of the Xanax and try to find a cool, calm place in her house to relax. She verbalized understanding and appreciated the call back. She is aware that once Dr. has responded to the message, we will call her back.

## 2021-07-05 DIAGNOSIS — J4540 Moderate persistent asthma, uncomplicated: Secondary | ICD-10-CM

## 2021-07-05 NOTE — Telephone Encounter (Signed)
Called and spoke with patient to make her aware of message from Dr. Isaiah Serge. She states that she was stared on a medication yesterday to help and referral is not needed at this time. Also states that one of the medications he wants her to take [facility] pharmacy does not have and they called and told her they are sending something over the office to let us know. Will forward to [place] to keep an eye out.

## 2021-07-06 ENCOUNTER — Other Ambulatory Visit: Payer: Medicare Other | Admitting: *Deleted

## 2021-07-06 ENCOUNTER — Ambulatory Visit (INDEPENDENT_AMBULATORY_CARE_PROVIDER_SITE_OTHER): Payer: Medicare Other

## 2021-07-06 ENCOUNTER — Other Ambulatory Visit: Payer: Self-pay

## 2021-07-06 ENCOUNTER — Other Ambulatory Visit: Payer: Medicare Other

## 2021-07-06 VITALS — BP 122/78 | HR 86 | Temp 97.7°F | Resp 18

## 2021-07-06 DIAGNOSIS — J4540 Moderate persistent asthma, uncomplicated: Secondary | ICD-10-CM | POA: Diagnosis not present

## 2021-07-06 DIAGNOSIS — Z515 Encounter for palliative care: Secondary | ICD-10-CM
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
## 2021-07-06 NOTE — Telephone Encounter (Signed)
Nothing received on patient this morning.

## 2021-07-10 ENCOUNTER — Telehealth: Payer: Self-pay | Admitting: Pulmonary Disease

## 2021-07-10 MED ORDER — YUPELRI 175 MCG/3ML IN SOLN: 175.0000 ug | Freq: Every day | RESPIRATORY_TRACT | 11 refills | Status: DC

## 2021-07-10 NOTE — Telephone Encounter (Signed)
Spoke to patient.  Patient stated that she stopped Yupelri one week ago as instructed by Dr. Isaiah Serge.  Since stopping the yupelri she has developed wheezing, rapid heart rate and increased sob. Sx started 2 days ago.  Patient unable to complete full sentence without stopping to catch her breath. She wears 3L QHS and 4L with exertion. Spo2 93% at rest with 4L.  Denied f/c/s or additional sx.  Fully vaccinated against covid and flu.  When trying to obtain list of medication that patient is currently taking, she became upset and requested that I call her back. I asked if she was upset because she was having a hard time breathing and she relayed no and ended the call prematurely.   Dr. Isaiah Serge, please advise. Thanks

## 2021-07-10 NOTE — Telephone Encounter (Signed)
Nothing received on patient at this time.

## 2021-07-10 NOTE — Telephone Encounter (Signed)
I am not sure why she is of the [place] as we do not make any changes to her nebulizer medications.  Please instruct her to resume Yupelri nebs She also needs to be on Pulmicort [budesonide], Brovana [arformoterol] nebs in addition to Daliresp and prednisone  If she is wheezing then we need to increase the prednisone dose to 40 mg and taper by 10 mg every 3 days until she is back to her baseline dose of 10 mg daily.

## 2021-07-10 NOTE — Telephone Encounter (Signed)
Spoke with pt who states she needed [place] to call in [facility] to [place]. Pt verified taking of all other nebulizer txs and Deliresp and prednisone as directed. Refill of Yupelri was placed. Pt refused prednisone at this time.

## 2021-07-11 ENCOUNTER — Telehealth: Payer: Self-pay | Admitting: Pulmonary Disease

## 2021-07-11 NOTE — Telephone Encounter (Signed)
Called [facility] ad spoke with Pharmacist Rose.  Current nebs reviewed.  Rose stated they needed clarification on current nebs. Nothing further at this time.  Per  Dr. Isaiah Serge- 07/10/21 encounter- I am not sure why she is of the [facility] as we do not make any changes to her nebulizer medications.  Please instruct her to resume Yupelri nebs She also needs to be on Pulmicort [budesonide], Brovana [arformoterol] nebs in addition to Daliresp and prednisone

## 2021-07-18 DIAGNOSIS — J4540 Moderate persistent asthma, uncomplicated: Secondary | ICD-10-CM

## 2021-07-19 ENCOUNTER — Ambulatory Visit (INDEPENDENT_AMBULATORY_CARE_PROVIDER_SITE_OTHER): Payer: Medicare Other

## 2021-07-19 ENCOUNTER — Other Ambulatory Visit: Payer: Self-pay

## 2021-07-19 DIAGNOSIS — J4540 Moderate persistent asthma, uncomplicated: Secondary | ICD-10-CM | POA: Diagnosis not present

## 2021-07-20 ENCOUNTER — Other Ambulatory Visit: Payer: Medicare Other

## 2021-07-20 ENCOUNTER — Other Ambulatory Visit: Payer: Self-pay

## 2021-07-20 ENCOUNTER — Ambulatory Visit: Payer: Medicare Other

## 2021-07-20 DIAGNOSIS — Z515 Encounter for palliative care: Secondary | ICD-10-CM

## 2021-07-21 ENCOUNTER — Ambulatory Visit (INDEPENDENT_AMBULATORY_CARE_PROVIDER_SITE_OTHER): Payer: Medicare Other | Admitting: Pulmonary Disease

## 2021-07-21 ENCOUNTER — Encounter: Payer: Self-pay | Admitting: Pulmonary Disease

## 2021-07-21 VITALS — BP 120/58 | HR 91 | Temp 97.7°F | Ht 68.0 in | Wt 141.0 lb

## 2021-07-21 DIAGNOSIS — J449 Chronic obstructive pulmonary disease, unspecified: Secondary | ICD-10-CM | POA: Diagnosis not present

## 2021-07-21 NOTE — Patient Instructions (Signed)
Continue the inhalers and injections Follow-up in clinic in 4 to 6 months

## 2021-07-21 NOTE — Progress Notes (Signed)
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 Previously followed by Dr. Chestine Spore and Dr. [name] at the pulmonary office. She is on Xolair since about 2015 which she gets through Dr. 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 she is on supplemental oxygen. Hospitalized with COVID-19 in January 2021 for 4 days. Given remdesivir and steroids.   Start on chronic prednisone 10 mg per daily by Dr. Dellis Anes for recurrent asthma exacerbation Contracted COVID-19 from her husband in early December 2022.  Prescribed Molnupiravir which she completed on 12/12.  Pets: No pets Occupation: Retired Exposures: Reports seeing mold at home. No hot tub, Jacuzzi or feather pillows or comforters Smoking history: Smoked as a teenager. 8-pack-year smoking history Travel history: Previously lived in [place] in [place] Relevant family history: No significant family history of lung disease  Interim history: She continues to have chronic dyspnea on exertion which is unchanged Continues on Xolair, inhalers  Reports significant stress at home due to interaction with her husband.  She is working with social work and palliative care at home  Outpatient Encounter Medications as of 07/21/2021  Medication Sig   albuterol (VENTOLIN HFA) 108 (90 Base) MCG/ACT inhaler INHALE 2 PUFFS BY MOUTH  EVERY 4 HOURS AS NEEDED FOR WHEEZE OR FOR SHORTNESS OF BREATH   albuterol (VENTOLIN HFA) 108 (90 Base) MCG/ACT inhaler INHALE 2 PUFFS INTO THE LUNGS EVERY 4 HOURS AS NEEDED FOR WHEEZE OR FOR SHORTNESS OF BREATH   aspirin 81 MG tablet Take 81 mg by mouth daily.   budesonide (PULMICORT) 0.5 MG/2ML nebulizer solution USE 1 VIAL  IN  NEBULIZER TWICE  DAILY - rinse mouth after treatment   carbamazepine (TEGRETOL) 200 MG tablet Take 200-400 mg by mouth 2 (two) times daily. 200mg  tab in the morning and 400mg  at night   cyclobenzaprine (FLEXERIL) 5 MG tablet Take 5 mg by mouth daily.   DALIRESP 500 MCG TABS tablet TAKE 1 TABLET BY MOUTH EVERY DAY   diclofenac sodium (VOLTAREN) 1 % GEL Apply 2 g topically 4 (four) times daily as needed (knee pain).    EPINEPHrine (EPIPEN 2-PAK) 0.3 mg/0.3 mL IJ SOAJ injection Inject 0.3 mg into the muscle Once PRN.   estradiol (ESTRACE) 1 MG tablet Take 1 mg by mouth daily.   gabapentin (NEURONTIN) 600 MG tablet Take 600-1,200 mg by mouth 2 (two) times daily. 600mg  in the morning and 1200mg  at bedtime   ipratropium (ATROVENT) 0.02 % nebulizer solution Take 2.5 mLs (0.5 mg total) by nebulization 4 (four) times daily.   levalbuterol (XOPENEX HFA) 45 MCG/ACT inhaler INHALE 2 PUFFS INTO THE LUNGS EVERY 4 HOURS AS NEEDED FOR WHEEZE   levalbuterol (XOPENEX) 0.63 MG/3ML nebulizer solution INHALE 3 MLS (0.63 MG TOTAL) BY NEBULIZATION EVERY 4 (FOUR) HOURS AS NEEDED. MIX WITH IPRATROPIUM.   levETIRAcetam (KEPPRA) 1000 MG tablet Take 1,000-2,000 mg by mouth 2 (two)  times daily. 1000mg  in the morning and 2000mg  at night   levocetirizine (XYZAL) 5 MG tablet TAKE 1 TABLET (5 MG TOTAL) BY MOUTH DAILY AS NEEDED FOR ALLERGIES (FOR RUNNY NOSE).   lidocaine (LIDODERM) 5 % Place 1 patch onto the skin daily.    meclizine (ANTIVERT) 25 MG tablet Take 25 mg by mouth 2 (two) times daily as needed for dizziness or nausea.    Melatonin 10 MG TABS Take 10 mg by mouth daily.   mirtazapine (REMERON)  30 MG tablet Take 30 mg by mouth every evening.    montelukast (SINGULAIR) 10 MG tablet Take 1 tablet at bedtime.   Multiple Vitamin (MULTIVITAMIN) tablet Take 1 tablet by mouth daily.    omeprazole (PRILOSEC) 40 MG capsule Take 40 mg by mouth 2 (two) times a day.   ondansetron (ZOFRAN) 4 MG tablet Take 1 tablet by mouth every 8 (eight) hours as needed.   ondansetron (ZOFRAN) 4 MG tablet Take 1 tablet (4 mg total) by mouth 2 (two) times daily as needed for nausea.   oxybutynin (DITROPAN) 5 MG tablet Take by mouth.   OXYGEN Inhale 3 L into the lungs at bedtime. 4 L when walking   predniSONE (DELTASONE) 10 MG tablet Take 1.5 tablets (15 mg total) by mouth daily with breakfast.   revefenacin (YUPELRI) 175 MCG/3ML nebulizer solution Inhale 3 mLs (175 mcg total) into the lungs daily.   [DISCONTINUED] diclofenac Sodium (VOLTAREN) 1 % GEL    arformoterol (BROVANA) 15 MCG/2ML NEBU Take 2 mLs (15 mcg total) by nebulization 2 (two) times daily. (Patient not taking: Reported on 07/21/2021)   HYDROcodone bit-homatropine (HYCODAN) 5-1.5 MG/5ML syrup Take 5 mLs by mouth every 6 (six) hours as needed for cough. (Patient not taking: Reported on 07/21/2021)   lisinopril (PRINIVIL,ZESTRIL) 10 MG tablet Take 10 mg by mouth daily.  (Patient not taking: Reported on 07/21/2021)   lisinopril (ZESTRIL) 5 MG tablet Take 15 mg by mouth daily. (Patient not taking: Reported on 07/03/2021)   polyvinyl alcohol (LIQUIFILM TEARS) 1.4 % ophthalmic solution Place 1 drop into both eyes as needed for dry eyes. (Patient not taking: Reported on 07/21/2021)   sodium chloride (OCEAN) 0.65 % SOLN nasal spray Place 1 spray into both nostrils as needed for congestion. (Patient not taking: Reported on 07/21/2021)   [DISCONTINUED] ALPRAZolam (XANAX) 0.5 MG tablet Take 0.5 tablets (0.25 mg total) by mouth at bedtime as needed for anxiety. (Patient not taking: Reported on 07/21/2021)   [DISCONTINUED] azelastine (ASTELIN) 0.1 % nasal spray Place 1-2  sprays into both nostrils 2 (two) times daily as needed (nasal drainage). Use in each nostril as directed   [DISCONTINUED] azithromycin (ZITHROMAX) 250 MG tablet Take 2 tabs on day 1, then 1 tab daily until finished   [DISCONTINUED] b complex vitamins capsule Take 1 capsule by mouth daily.   [DISCONTINUED] dicyclomine (BENTYL) 10 MG capsule Take 10 mg by mouth 4 (four) times daily -  before meals and at bedtime.    [DISCONTINUED] diphenoxylate-atropine (LOMOTIL) 2.5-0.025 MG tablet Take 1 tablet by mouth 4 (four) times daily as needed for diarrhea or loose stools.   [DISCONTINUED] fluocinonide cream (LIDEX) 0.05 % Apply 1 application topically 2 (two) times daily.   [DISCONTINUED] predniSONE (DELTASONE) 10 MG tablet TAKE 1 TABLET (10 MG TOTAL) BY MOUTH DAILY WITH BREAKFAST. (Patient not taking: Reported on 07/21/2021)   [DISCONTINUED] predniSONE (DELTASONE) 20 MG tablet Take 2 tablets (40 mg total) by mouth daily with breakfast. (Patient not taking: Reported  on 07/03/2021)   [DISCONTINUED] predniSONE (DELTASONE) 20 MG tablet Take 40mg  for 5 days (Patient not taking: Reported on 07/03/2021)   Facility-Administered Encounter Medications as of 07/21/2021  Medication   omalizumab Geoffry Paradise) injection 150 mg   Physical Exam: Blood pressure (!) 120/58, pulse 91, temperature 97.7 F (36.5 C), temperature source Oral, height 5\' 8"  (1.727 m), weight 141 lb (64 kg), SpO2 95 %. Gen:      No acute distress HEENT:  EOMI, sclera anicteric Neck:     No masses; no thyromegaly Lungs:    Clear to auscultation bilaterally; normal respiratory effort CV:         Regular rate and rhythm; no murmurs Abd:      + bowel sounds; soft, non-tender; no palpable masses, no distension Ext:    No edema; adequate peripheral perfusion Skin:      Warm and dry; no rash Neuro: alert and oriented x 3 Psych: normal mood and affect   Data Reviewed:  Imaging: CT chest 06/20/19-mild patchy groundglass opacities, left base consolidation,  severe emphysema, coronary atherosclerosis  Chest x-ray 06/03/2020-emphysematous changes, hyperinflation I have reviewed the images personally  PFTs: Spirometry 03/14/20 FVC 1.60 [45%], FEV1 0.66 [25%], F/F 0.41 Severe obstruction  Labs: CBC 06/21/19-WBC 9.9, eos 0%  Assessment:  Severe COPD, asthma overlap syndrome Continue LABA, LAMA, ICS nebulizers On Daliresp Getting Xolair through the allergy office On chronic prednisone at 10 mg.  She will follow-up with primary care to get bone density check  Continue home palliative care  Plan/Recommendations: Continue bronchodilators, inhaled corticosteroid, Daliresp Asthma treatment with Xolair, Singulair, chronic prednisone  Chilton Greathouse MD [facility] 07/21/2021, 2:50 PM  CC: Doreen Salvage, PA-C

## 2021-07-22 ENCOUNTER — Other Ambulatory Visit: Payer: Self-pay | Admitting: Allergy & Immunology

## 2021-07-23 NOTE — Progress Notes (Signed)
COMMUNITY PALLIATIVE CARE SW NOTE  PATIENT NAME: Anum Palecek DOB: 1950/11/28 MRN: [id number]  PRIMARY CARE PROVIDER: Doreen Salvage, PA-C  RESPONSIBLE PARTY:  Acct ID - Guarantor Home Phone Work Phone Relationship Acct Type  [id number] - Nolden,Dahlila [phone]  Self P/F     [address], [address], [place],  [address]   Due to the COVID-19 crisis, this virtual check-in visit was done via telephone from my office and it was initiated and consent by this patient and or family  SOCIAL WORK TELEPHONE ENCOUNTER (1:41 pm- 1: 52 pm)  PC SW completed follow-up call to patient to assess her need and safety. Patient stated that she is scheduled to see her doctor tomorrow which she will discuss the increased shortness of breath that has been a concern of hers. She report that she is continues to have shortness of breath with anxiety. She asked if SW attempted to contact her son. SW advised her that she has left two messages for her son, but has not received a return call. She expressed disappointment. SW advised her that she was concern about her safety due to the marital issues she reported she had. Patient advised that she was riding in the car with her husband and could not talk. SW asked patient is she was safe and she stated she was. SW asked if she could call her back when she was able to talk-patient stated that she would.  No other concerns are noted.        [address] [place], [place]

## 2021-07-25 ENCOUNTER — Telehealth: Payer: Self-pay | Admitting: Pulmonary Disease

## 2021-07-25 ENCOUNTER — Other Ambulatory Visit: Payer: Medicare Other

## 2021-07-25 ENCOUNTER — Other Ambulatory Visit: Payer: Self-pay

## 2021-07-25 DIAGNOSIS — Z515 Encounter for palliative care: Secondary | ICD-10-CM

## 2021-07-25 MED ORDER — LEVALBUTEROL TARTRATE 45 MCG/ACT IN AERO: INHALATION_SPRAY | RESPIRATORY_TRACT | 1 refills | Status: DC

## 2021-07-25 NOTE — Progress Notes (Signed)
COMMUNITY PALLIATIVE CARE SW NOTE  PATIENT NAME: Anne Rich DOB: [birth year] MRN: [id number]  PRIMARY CARE PROVIDER: Egbert Garibaldi, PA-C  RESPONSIBLE PARTY:  Acct ID - Guarantor Home Phone Work Phone Relationship Acct Type  [id number] - Man,Beuna [phone]  Self P/F     [address], [address], [place], [place] [address]   Due to the COVID-19 crisis, this virtual check-in visit was done via telephone from my office and it was initiated and consent by this patient and or family  SOCIAL WORK TELEPHONIC ENCOUNTER (12:00 pm-12:15 pm)  PC SW completed a follow-up call to patient who reported that she wanted to update the team. She advised that her PCP wanted palliative care to see her weekly. SW asked if there was changes in her condition. She stated no, but she was prescribed a new medication for her breathing. She stated that she has not seen the palliative care physician yet. SW reiterated education to patient regarding palliative care visit frequencies based on symptom management. SW scheduled patient for 08/17/21 @ 10 am with Dr. Mariea Clonts. Patient stated that she is looking forward to that visit because she had several things she needed to discuss with her. No other concerns noted.   [address] [place], [place]

## 2021-07-25 NOTE — Telephone Encounter (Signed)
I called the patient and she wants her medication sent to [facility] mail order in the meantime to see if they can get the inhaler filled sooner for her and she was appreciative of the call. Nothing further needed.

## 2021-07-27 IMAGING — DX DG CHEST 1V PORT
1 series · 1 of 1 positions shown · non-contrast
Comparison: June 19, 2018 chest CT. Chest radiograph February 12, 2018

CLINICAL DATA: Shortness of breath

EXAM:
PORTABLE CHEST 1 VIEW

[chest ap]
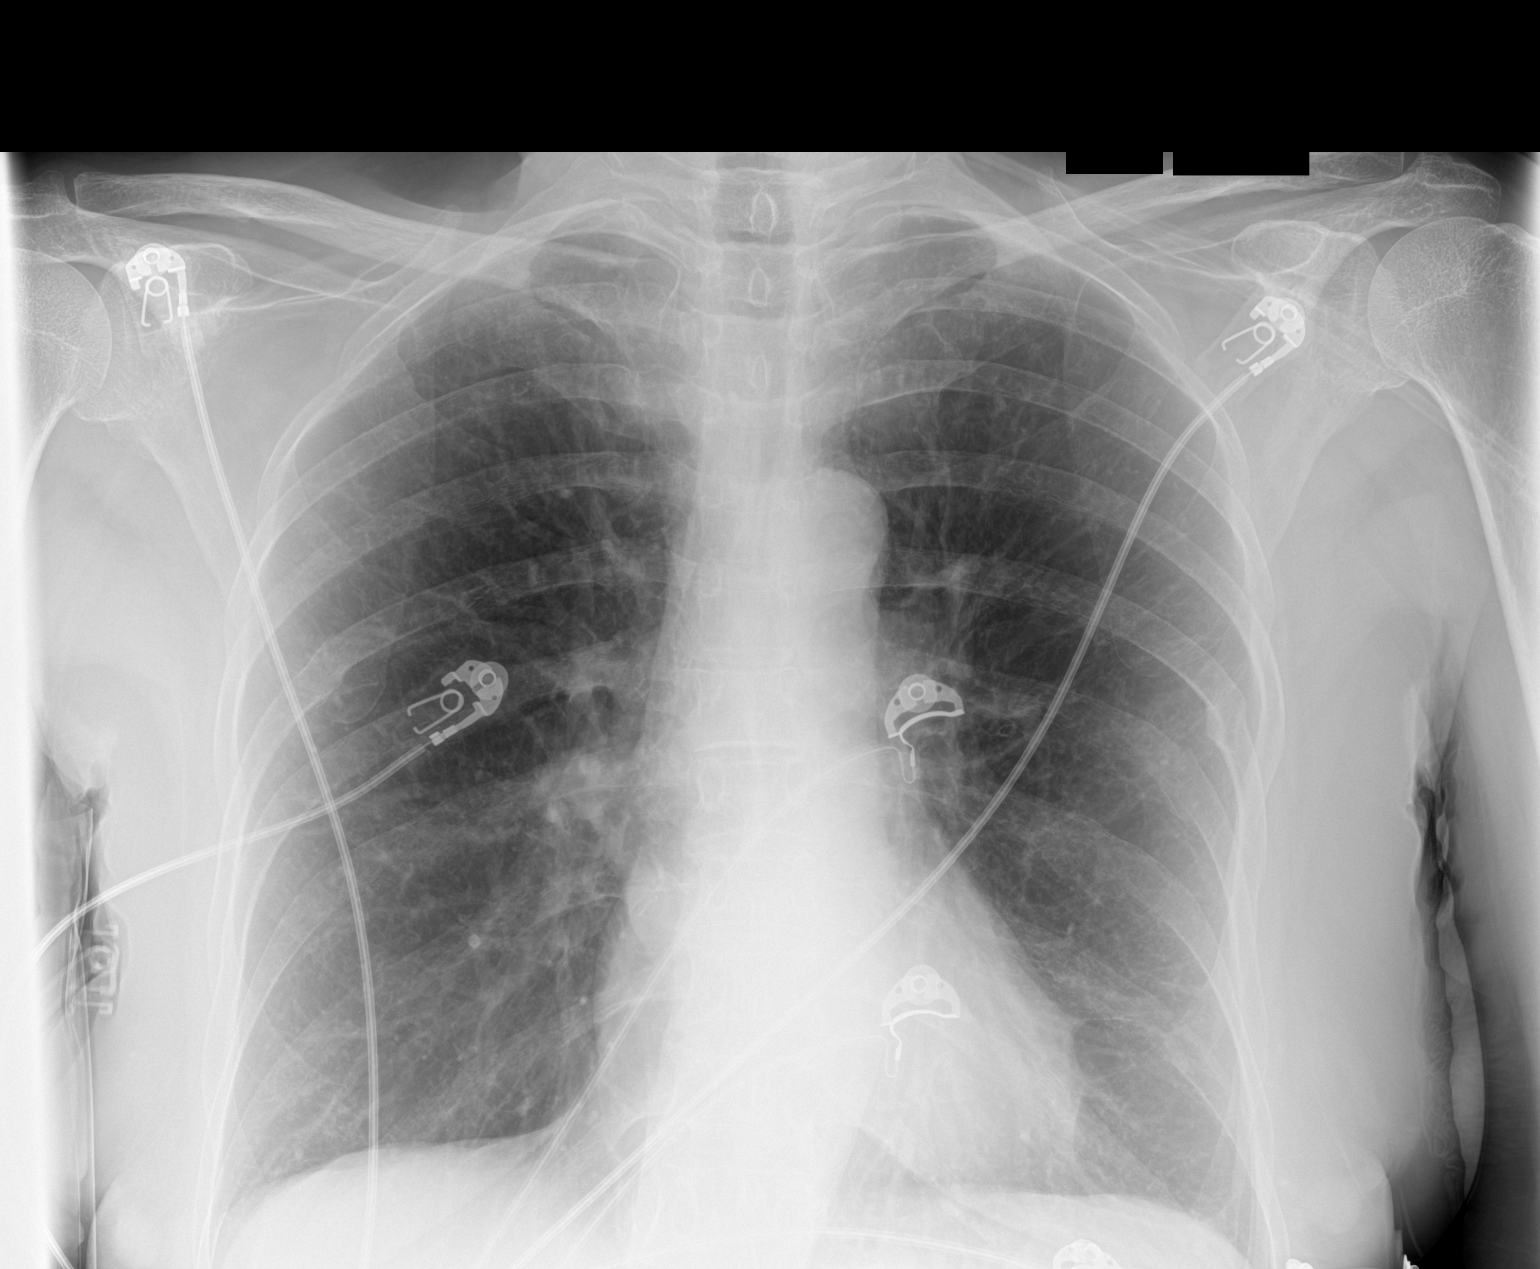

[1 of 1 positions shown; findings below may reference images not displayed]

FINDINGS: Lungs appear hyperexpanded. Bullous disease in the upper lobes is
better seen by CT. No edema or consolidation. The heart size and
pulmonary vascularity are normal. No evident adenopathy. No bone
lesions.
IMPRESSION: Changes of COPD appear stable. No edema or consolidation. Stable
cardiac silhouette.

## 2021-07-31 ENCOUNTER — Telehealth: Payer: Self-pay | Admitting: Pulmonary Disease

## 2021-07-31 MED ORDER — LEVALBUTEROL HCL 0.63 MG/3ML IN NEBU: INHALATION_SOLUTION | RESPIRATORY_TRACT | 11 refills | Status: DC

## 2021-07-31 NOTE — Telephone Encounter (Signed)
Called patient and clarified the order of medication that she needs to be sent in and the pharmacy that she wishes it to be sent to. Sent in correct medication. Nothing further needed at this time

## 2021-08-01 DIAGNOSIS — J4540 Moderate persistent asthma, uncomplicated: Secondary | ICD-10-CM | POA: Diagnosis not present

## 2021-08-02 ENCOUNTER — Other Ambulatory Visit: Payer: Self-pay

## 2021-08-02 ENCOUNTER — Ambulatory Visit (INDEPENDENT_AMBULATORY_CARE_PROVIDER_SITE_OTHER): Payer: Medicare Other

## 2021-08-02 DIAGNOSIS — J4540 Moderate persistent asthma, uncomplicated: Secondary | ICD-10-CM | POA: Diagnosis not present

## 2021-08-11 ENCOUNTER — Telehealth: Payer: Self-pay | Admitting: Allergy & Immunology

## 2021-08-11 NOTE — Telephone Encounter (Signed)
Lm for pt to call us back about this °

## 2021-08-11 NOTE — Telephone Encounter (Signed)
Is this ok for her to use Dr. Dellis Anes?  ?

## 2021-08-11 NOTE — Telephone Encounter (Signed)
Patient states she is currently taking Ipratropium and Yupelri (prescribed by Dr. Isaiah Serge). She was never told to stop using the Ipratropium so she wanted to make sure it was okay for her to continue using it or if she needed to stop. If she can continue to use both, she will need a refill on it.  ?

## 2021-08-11 NOTE — Telephone Encounter (Signed)
I think she can use the ipratropium AS NEEDED, but if she is using it regularly I would stop.  ? ?So when she is feeling particularly SOB, she can add the ipratropium (and even mix with it albuterol). OK to refill. ? ?Malachi Bonds, MD ?[facility] ? ?

## 2021-08-13 ENCOUNTER — Other Ambulatory Visit: Payer: Self-pay | Admitting: Allergy & Immunology

## 2021-08-14 NOTE — Telephone Encounter (Signed)
Informed pt of this and she stated her understanding  ?

## 2021-08-16 NOTE — Progress Notes (Signed)
[facility] PALLIATIVE CARE RN NOTE  PATIENT NAMENahjae Rich DOB: 1951-04-29 MRN: [id number]  PRIMARY CARE PROVIDER: Egbert Garibaldi, PA-C  RESPONSIBLE PARTY: Angelena Sole (son) Acct ID - Guarantor Home Phone Work Phone Relationship Acct Type  [id number] - Berlanga,Lema [phone]  Self P/F     [address], [address], [place], [place] [address]   Covid-19 Pre-screening Negative  PLAN OF CARE and INTERVENTION:  ADVANCE CARE PLANNING/GOALS OF CARE: Patient states her goal is to stay in control. She is a Full code. PATIENT/CAREGIVER EDUCATION: Explained palliative care services, symptom management, s/s of infection DISEASE STATUS: Joint palliative care initial visit completed with SW, M. Lonon. Met with patient and her husband in their [place] apartment at [facility]. Patient reports having a peaceful day so far. She does experience pain and tightness in her chest. She explains it as a "squeeze in her lungs." She does experience shortness of breath with exertion. She wears oxyen at 4L as needed. She takes several nebulized medications several times/day and states that her pulmonologist is supposed to start her on another nebulizer medication next week (she is unsure of the name). She reports going in for a Xolair injection this morning for her asthma. She is currently on Prednisone 40 mg which she says started a few days ago. She has a history of stroke and seizures. She states she has a seizure about every 8-10 years. She still drives but only for short distances. 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 She has very high anxiety. She is currently taking Ativan 1 mg 3x/day but feels that it doesn't help much. She states about 20 years ago she was taking Ativan 89m 4x/day. She advised she can't take Xanax as it made her too "foggy." She reports ongoing diarrhea and takes 4 Imodium tablets as soon as she wakes up in the  mornings. Diarrhea is worse if her abdominal cramping is worse or she is more nervous. She is independent with ADLs. She walks with a cane when travelling outside of the home. She has someone to clean her apartment every couple of weeks. She doesn't have much of an appetite. She is mainly only eating 1 meal/day which is breakfast. She enjoys eating sweets. She says she is losing weight. She states that she used to be 160 lbs about 1 1/2 ago but now weighs ~137 lbs. She denies dysphagia. She is agreeable to ongoing visits with palliative care.   HISTORY OF PRESENT ILLNESS:  This is a 71yo female with a past medical history of CVA, bilateral carotid stenosis, essential hypertension, migraines, asthma, GERD, anxiety and major depressive disorder. Palliative care has been asked to follow patient for goals of care, assist with symptom management and complex decision making.  CODE STATUS: Full code ADVANCED DIRECTIVES: Y MOST FORM: no PPS: 70%   PHYSICAL EXAM:   VITALS: Today's Vitals   07/06/21 1116  BP: 122/78  Pulse: 86  Resp: 18  Temp: 97.7 F (36.5 C)  TempSrc: Temporal  SpO2: 94%  PainSc: 2   PainLoc: Chest    LUNGS: clear to auscultation  CARDIAC: Cor RRR EXTREMITIES: No edema SKIN: Skin color, texture, turgor normal. No rashes or lesions  NEURO:  Alert and oriented x 4, ambulatory    (Duration of visit and documentation 90 minutes)   MDaryl Eastern RN BSN

## 2021-08-17 ENCOUNTER — Other Ambulatory Visit: Payer: Self-pay

## 2021-08-17 ENCOUNTER — Other Ambulatory Visit: Payer: Medicare Other | Admitting: Internal Medicine

## 2021-08-17 DIAGNOSIS — J449 Chronic obstructive pulmonary disease, unspecified: Secondary | ICD-10-CM

## 2021-08-17 DIAGNOSIS — Z638 Other specified problems related to primary support group: Secondary | ICD-10-CM

## 2021-08-17 DIAGNOSIS — Z515 Encounter for palliative care: Secondary | ICD-10-CM

## 2021-08-17 DIAGNOSIS — F419 Anxiety disorder, unspecified: Secondary | ICD-10-CM

## 2021-08-17 DIAGNOSIS — J4489 Other specified chronic obstructive pulmonary disease: Secondary | ICD-10-CM

## 2021-08-17 NOTE — Progress Notes (Signed)
[facility] Palliative Care Consult Note Telephone: [phone]  Fax: [phone]   Date of encounter: 08/17/21 7:30 AM PATIENT NAME: Anne Rich [address] [place] [address]   [phone] (home)  DOB: 06-Apr-1951 MRN: [id number] PRIMARY CARE PROVIDER:    Emelia Loron,  [address] [place] [address] [phone]  REFERRING PROVIDER:   Egbert Garibaldi, PA-C [address] [place],  [place] [address] [phone]  RESPONSIBLE PARTY:    Contact Information     Name Relation Home Work Mobile   Anne Rich [phone]          I met face to face with patient and family in her home ([place] apt at [address] [phone]). Palliative Care was asked to follow this patient by consultation request of  [name], Eather Colas to address advance care planning and complex medical decision making. This is the initial visit.                                     ASSESSMENT AND PLAN / RECOMMENDATIONS:   Advance Care Planning/Goals of Care: Goals include to maximize quality of life and symptom management. Patient/health care surrogate gave his/her permission to discuss.Our advance care planning conversation included a discussion about:    She reports she was never on hospice before, but her husband was.   She says she has previously completed a MOST with someone else (not in epic now that I have internet to check--did not at time of visit for some reason) Her youngest son, Anne Rich, is her HCPOA and did not want pt to marry her husband.  Usually responds to texts.    He's not speaking to her.  She may need to change things to her eldest who is Anne Rich.    Does not want to be intubated.  Has her funeral arrangements made.  Has [facility] funeral home.  She hopes her soul will go to heaven.  Wants her family notified after a short service has been done.  Her husband had hospice care before.   Would want abx for  pneumonia, limited additional interventions if tx at home not a success, would want cpr,no ventilation, no feeding tube, trial of IVFs   Symptom Management/Plan: 1. Anxiety -pt reports that this is worse since July (when she got married) due to some issues with her husband's cell phone -will ask social worker to reach out to her as pt's husband present for visit and she would not share too much in his presence, just reported that her son who has been [place] has not supported the marriage and therefore has not been communicating with her  2. Asthma-COPD overlap syndrome ([place]) -pt requests clarification of her regimen -using O2 at hs only and with exertion -no longer taking walks due to humidity (also has chronic back pain)  3. Palliative care encounter -reviewed MOST sections with her, but form was not done b/c I could not see if she had one already as she reported--none seen in the home and now I see it's not in epic (?done with outside provider) -should have one with current wishes uploaded to epic so I'll ask social work to do this when she visits or speaks with her  4. Family discord -will ask social work to speak with pt about this--I'm concerned about this situation  but could not get details at that time from pt -anxiety is worse and she's requesting an increase in ativan to qid as a result -I suggested an SSRI added to her regimen, but we would need to choose carefully so as not to reduce her seizure threshold--she really did not even want to consider this, requesting an additional ativan--I did say I would share this with Dr. Vaughan Browner who is prescribing  Follow up Palliative Care Visit: Palliative care will continue to follow for complex medical decision making, advance care planning, and clarification of goals. Return 8  weeks or prn.   This visit was coded based on medical decision making (MDM).  20 mins on ACP reviewing MOST and other personal goals/beliefs.  PPS: 60%  HOSPICE  ELIGIBILITY/DIAGNOSIS: TBD  Chief Complaint: initial palliative consult at home  HISTORY OF PRESENT ILLNESS:  Anne Rich is a 71 y.o. year old female  with  asthma-COPD overlap syndrome,  covid in 1/21 and again in 12/22, anxiety and some familial discord.  Per her chart, she is to be on xolair, albuterol, pulmicort, daliresp, prednisone and revefenacin nebs.  She reports some confusion about which nebs and inhalers she should be taking.    Uses oxygen at night, will take along for exertion when out. 3 months ago, she did not need it.   No longer has the strength to do a lot of walking Mobility is good physically She will feel like something is squeezing her to death, if she gets anxious, it's twice as bad.   She got all upset about her husband's phone not working.   71 yo  Having hematuria off and on with UTI.  Only started abx a day ago. Her husband lives with her.  They got married 7/30.   She does get sob with bathing and dressing. She'd been walking 2 miles a day, but does not tolerate humidity well.  If it rains, she cannot breathe.  She says Ativan used to help her breathe better in those circumstances.   Tried Xanax 0.27m and it made her feel confused the first time; says it calms her down short term, but not lasting She had an episode of incontinence the other day and thinks she'd had a seizure.  Had not had one in 10 yrs.    History obtained from review of EMR, discussion with primary team, and interview with family, facility staff/caregiver and/or Anne Rich.  I reviewed available labs, medications, imaging, studies and related documents from the EMR.  Records reviewed and summarized above.   ROS  General: NAD EYES: denies vision changes ENMT: denies dysphagia Cardiovascular: denies chest pain, has DOE Pulmonary: has cough, has increased SOB Abdomen: endorses diminished appetite, denies constipation, endorses continence of bowel GU: denies dysuria, endorses continence of  urine MSK:  denies increased weakness,  no falls reported Skin: denies rashes or wounds Neurological: reports back pain, denies insomnia Psych: Endorses anxious mood/stress, she does not feel unsafe when asked this (when husband had dozed off) Heme/lymph/immuno: easily bruises, abnormal bleeding  Physical Exam: Current and past weights: 141 lbs 07/21/21 give or take the same for a few months Constitutional: NAD General: thin appearing but mobile in home w/o assistive device EYES: anicteric sclera, lids intact, no discharge  ENMT: intact hearing, oral mucous membranes moist, dentition intact CV: S1S2, RRR, no LE edema Pulmonary:  some coarse breath sounds in lower lung fields posteriorly and bilaterally, not wheezing, does have some increased work of breathing, no cough, 97-98%  on room air Abdomen: intake small, normo-active BS + 4 quadrants, soft and non tender, no ascites GU: deferred MSK: no sarcopenia, moves all extremities, ambulatory though appears stiff  Skin: warm and dry, no rashes or wounds on visible skin Neuro: no generalized weakness,  no cognitive impairment Psych: anxious affect, A and O x 3 Hem/lymph/immuno: bruising on dorsum of hands  CURRENT PROBLEM LIST:  Patient Active Problem List   Diagnosis Date Noted   Current mild episode of major depressive disorder ([place]) 05/23/2021   Ecchymosis 07/27/2020   COVID-19 virus infection 06/20/2019   Medication management 05/29/2019   Severe persistent asthma without complication 16/03/9603   Physical deconditioning 01/06/2019   Counseling regarding end of life decision making 01/06/2019   Lumbosacral dysfunction 09/04/2018   Chronic respiratory failure with hypoxia ([place]), 3L home O2 05/01/2018   Statin intolerance 02/13/2018   Adverse effect of other drugs, medicaments and biological substances, initial encounter 01/01/2018   Bilateral carotid artery stenosis 10/14/2017   Spells of decreased attentiveness 09/10/2017    Cortical age-related cataract of left eye 10/24/2016   Nuclear sclerotic cataract of left eye 10/24/2016   History of epistaxis 07/19/2016   Advance directive in chart 04/04/2016   Idiopathic peripheral neuropathy 03/12/2016   Loss of appetite 03/12/2016   Psychophysiological insomnia 03/12/2016   Oxygen dependent 02/28/2016   Easy bruising 12/26/2015   Gastroesophageal reflux disease 12/02/2015   Coronary artery calcification seen on CT scan 09/20/2015   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[place]) 09/20/2015   Abnormal weight loss 09/06/2015   Herniated lumbar intervertebral disc 07/21/2015   Migraine without aura and without status migrainosus, not intractable 07/21/2015   Anxiety 06/28/2015   Chronic constipation 06/28/2015   Chronic use of benzodiazepine for therapeutic purpose 06/28/2015   Former smoker 06/28/2015   Hepatic steatosis 06/28/2015   History of cerebrovascular accident (CVA) with residual deficit 06/28/2015   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   Hypoxemia 06/28/2015   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/28/2015   Osteopenia 06/28/2015   Unspecified convulsions ([place]) 06/28/2015   Acute poliomyelitis, unspecified 04/18/2015   CVA (cerebral vascular accident) ([place]) 04/18/2015   Essential hypertension 04/18/2015   Other allergic rhinitis 02/10/2015   Asthma-COPD overlap syndrome ([place]) 02/10/2015   Hip pain 07/08/2013   Melena 04/22/2013   Hypercholesterolemia 02/12/2013   Depressive disorder 11/15/2012   Pain disorders related to psychological factors 11/15/2012   Low back pain 06/18/2012   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   Right foot pain 06/18/2012   Onychomycosis 03/21/2012   Pain, chronic 07/13/2011   Shortness of breath 02/13/2011   PAST MEDICAL HISTORY:  Active Ambulatory Problems    Diagnosis Date Noted   Other allergic rhinitis 02/10/2015   Asthma-COPD overlap syndrome ([place]) 02/10/2015   History of epistaxis 07/19/2016   Abnormal weight loss 09/06/2015   Acute  poliomyelitis, unspecified 04/18/2015   Advance directive in chart 04/04/2016   Adverse effect of other drugs, medicaments and biological substances, initial encounter 01/01/2018   Anxiety 06/28/2015   Bilateral carotid artery stenosis 10/14/2017   Low back pain 06/18/2012   Chronic constipation 06/28/2015   Chronic respiratory failure with hypoxia ([place]), 3L home O2 05/01/2018   Chronic use of benzodiazepine for therapeutic purpose 06/28/2015   Coronary artery calcification seen on CT scan 09/20/2015   Cortical age-related cataract of left eye 10/24/2016   CVA (cerebral vascular accident) ([place]) 04/18/2015   Depressive disorder 11/15/2012   Easy bruising 12/26/2015   Essential hypertension 04/18/2015   Former smoker 06/28/2015  Gastroesophageal reflux disease 12/02/2015   Hepatic steatosis 06/28/2015   Herniated lumbar intervertebral disc 07/21/2015   Hip pain 07/08/2013   History of cerebrovascular accident (CVA) with residual deficit 06/28/2015   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   Hypercholesterolemia 02/12/2013   Hypoxemia 06/28/2015   Idiopathic peripheral neuropathy 03/12/2016   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/28/2015   Loss of appetite 03/12/2016   Lumbosacral dysfunction 09/04/2018   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[place]) 09/20/2015   Melena 04/22/2013   Migraine without aura and without status migrainosus, not intractable 07/21/2015   Nuclear sclerotic cataract of left eye 10/24/2016   Oxygen dependent 02/28/2016   Onychomycosis 03/21/2012   Osteopenia 06/28/2015   Pain disorders related to psychological factors 11/15/2012   Pain, chronic 07/13/2011   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   Psychophysiological insomnia 03/12/2016   Right foot pain 06/18/2012   Shortness of breath 02/13/2011   Spells of decreased attentiveness 09/10/2017   Statin intolerance 02/13/2018   Unspecified convulsions ([place]) 06/28/2015   Physical deconditioning 01/06/2019   Counseling regarding end of life  decision making 01/06/2019   Severe persistent asthma without complication 71/11/2692   Medication management 05/29/2019   COVID-19 virus infection 06/20/2019   Ecchymosis 07/27/2020   Current mild episode of major depressive disorder ([place]) 05/23/2021   Resolved Ambulatory Problems    Diagnosis Date Noted   Moderate persistent asthma, uncomplicated 85/46/2703   Restrictive airway disease 12/09/2017   Moderate persistent asthma with acute exacerbation 09/09/2018   Asthma with COPD with exacerbation ([place]) 06/20/2019   Past Medical History:  Diagnosis Date   Allergic rhinitis    Asthma    COPD (chronic obstructive pulmonary disease) ([place])    SOCIAL HX:  Social History   Tobacco Use   Smoking status: Former    Packs/day: 0.50    Years: 15.00    Pack years: 7.50    Types: Cigarettes    Quit date: 06/11/1994    Years since quitting: 27.2   Smokeless tobacco: Never  Substance Use Topics   Alcohol use: No   FAMILY HX:  Family History  Problem Relation Age of Onset   Allergic rhinitis Neg Hx    Angioedema Neg Hx    Asthma Neg Hx    Eczema Neg Hx    Immunodeficiency Neg Hx    Urticaria Neg Hx       ALLERGIES:  Allergies  Allergen Reactions   Iodine Itching, Rash and Swelling    IV contrast    Iodine-131 Itching and Swelling    Pt states causes severe itching Pt states causes severe itching    Penicillins Hives and Swelling    Swelling of the throat   Pineapple Other (See Comments) and Swelling    Pt states causes her throat swelling Pt states causes her throat swelling    Shellfish Allergy Rash and Swelling    Had reaction to cardiac cath dye  Had seizure ,rash ,itch Oct. 2012 Had reaction to cardiac cath dye  Had seizure ,rash ,itch Oct. 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   Pravastatin Other (See Comments)    Pt states she couldn't lift her legs to walk   Shellfish-Derived Products     Xanax [Alprazolam]    Iodinated Contrast Media Rash     PERTINENT MEDICATIONS:  Outpatient Encounter Medications as of 08/17/2021  Medication Sig   albuterol (VENTOLIN HFA) 108 (90 Base) MCG/ACT inhaler INHALE  2 PUFFS BY MOUTH EVERY 4 HOURS AS NEEDED FOR WHEEZE OR FOR SHORTNESS OF BREATH   albuterol (VENTOLIN HFA) 108 (90 Base) MCG/ACT inhaler INHALE 2 PUFFS INTO THE LUNGS EVERY 4 HOURS AS NEEDED FOR WHEEZE OR FOR SHORTNESS OF BREATH   arformoterol (BROVANA) 15 MCG/2ML NEBU Take 2 mLs (15 mcg total) by nebulization 2 (two) times daily. (Patient not taking: Reported on 07/21/2021)   aspirin 81 MG tablet Take 81 mg by mouth daily.   budesonide (PULMICORT) 0.5 MG/2ML nebulizer solution USE 1 VIAL  IN  NEBULIZER TWICE  DAILY - rinse mouth after treatment   carbamazepine (TEGRETOL) 200 MG tablet Take 200-400 mg by mouth 2 (two) times daily. 252m tab in the morning and 4087mat night   cyclobenzaprine (FLEXERIL) 5 MG tablet Take 5 mg by mouth daily.   DALIRESP 500 MCG TABS tablet TAKE 1 TABLET BY MOUTH EVERY DAY   diclofenac sodium (VOLTAREN) 1 % GEL Apply 2 g topically 4 (four) times daily as needed (knee pain).    EPINEPHrine (EPIPEN 2-PAK) 0.3 mg/0.3 mL IJ SOAJ injection Inject 0.3 mg into the muscle Once PRN.   estradiol (ESTRACE) 1 MG tablet Take 1 mg by mouth daily.   gabapentin (NEURONTIN) 600 MG tablet Take 600-1,200 mg by mouth 2 (two) times daily. 60059mn the morning and 1200m43m bedtime   ipratropium (ATROVENT) 0.02 % nebulizer solution Take 2.5 mLs (0.5 mg total) by nebulization 4 (four) times daily.   levalbuterol (XOPENEX HFA) 45 MCG/ACT inhaler INHALE 2 PUFFS INTO THE LUNGS EVERY 4 HOURS AS NEEDED FOR WHEEZE   levalbuterol (XOPENEX) 0.63 MG/3ML nebulizer solution INHALE 3 MLS (0.63 MG TOTAL) BY NEBULIZATION EVERY 4 (FOUR) HOURS AS NEEDED. MIX WITH IPRATROPIUM.   levETIRAcetam (KEPPRA) 1000 MG tablet Take 1,000-2,000 mg by mouth 2 (two) times daily. 1000mg49mthe morning and 2000mg 64mnight   levocetirizine (XYZAL) 5 MG tablet TAKE 1 TABLET (5 MG TOTAL) BY MOUTH DAILY AS NEEDED FOR ALLERGIES (FOR RUNNY NOSE).   lidocaine (LIDODERM) 5 % Place 1 patch onto the skin daily.    lisinopril (PRINIVIL,ZESTRIL) 10 MG tablet Take 10 mg by mouth daily.  (Patient not taking: Reported on 07/21/2021)   lisinopril (ZESTRIL) 5 MG tablet Take 15 mg by mouth daily. (Patient not taking: Reported on 07/03/2021)   meclizine (ANTIVERT) 25 MG tablet Take 25 mg by mouth 2 (two) times daily as needed for dizziness or nausea.    Melatonin 10 MG TABS Take 10 mg by mouth daily.   mirtazapine (REMERON) 30 MG tablet Take 30 mg by mouth every evening.    montelukast (SINGULAIR) 10 MG tablet Take 1 tablet at bedtime.   Multiple Vitamin (MULTIVITAMIN) tablet Take 1 tablet by mouth daily.    omeprazole (PRILOSEC) 40 MG capsule Take 40 mg by mouth 2 (two) times a day.   ondansetron (ZOFRAN) 4 MG tablet Take 1 tablet by mouth every 8 (eight) hours as needed.   ondansetron (ZOFRAN) 4 MG tablet Take 1 tablet (4 mg total) by mouth 2 (two) times daily as needed for nausea.   oxybutynin (DITROPAN) 5 MG tablet Take by mouth.   OXYGEN Inhale 3 L into the lungs at bedtime. 4 L when walking   predniSONE (DELTASONE) 10 MG tablet Take 1.5 tablets (15 mg total) by mouth daily with breakfast.   revefenacin (YUPELRI) 175 MCG/3ML nebulizer solution Inhale 3 mLs (175 mcg total) into the lungs daily.   Facility-Administered Encounter Medications as  of 08/17/2021  Medication   omalizumab Arvid Right) injection 150 mg  Current meds per pt as she takes them: Baby asa daily Ipratropium and albuterol nebs Revefenacin neb Pulmicort neb Arformoterol neb Rescue albuterol hfa 3 puffs firsth thing and 2 puffs when needed throughout the day Keppra 1034m one in am and two in evening Tegretol 2071mone in am and two in evening Remeron 7.45m56mhs (not taking the half in the am) Gabapentin 600m77m bid and 2 at hs Dicyclomine 10mg4mid  (not qid and hs)  Estradiol 1mg q41mfor hormones Cyclobenzaprine 10mg p10ms Omeprazole 40mg po46m before meals Montelukast 10mg po 64mPrednisone 10mg qam 145mcetirizine 45mg po dai48m MVI daily D3 2000 units daily Lisinopril 20mg po dai30m2 10mg pills) 60mumilast 500mcg daily O345msetron 4mg po daily p21mNitrofurantoin 100mg po bid for42m  Ativan 1mg po tid--woul80mike to have an extra to take if needed (would use nearly daily)  Thank you for the opportunity to participate in the care of Ms. Coultas.  The palliative care team will continue to follow. Please call our office at [phone] if [phone] assistance.   Tiffany Reed, DO [facility] KinnierATIENT SCREENING TOOL Asked and negative response unless otherwise noted:  Have you had symptoms of covid, tested positive or been in contact with someone with symptoms/positive test in the past 5-10 days? no

## 2021-08-17 NOTE — Progress Notes (Deleted)
[facility] Pall[facility] Follow-Up Visit Telephone: [phone]  Fax: [phone]   Date of encounter: 08/17/21 7:25 AM PATIENT NAME: Anne Rich [address] [place] [address]   [phone] (home)  DOB: [birth year] MRN: [id number] PRIMARY CARE PROVIDER:    Emelia Rich,  [address] [place] [address] [phone]  REFERRING PROVIDER:   Egbert Garibaldi, PA-C [address] [place],  [place] [address] [phone]  RESPONSIBLE PARTY:    Contact Information     Name Relation Home Work Mobile   Anne Rich [phone]          I met face to face with patient and family in *** home/facility. Palliative Care was asked to follow this patient by consultation request of  [name], Eather Colas to address advance care planning and complex medical decision making. This is follow-up visit.                                     ASSESSMENT AND PLAN / RECOMMENDATIONS:   Advance Care Planning/Goals of Care: Goals include to maximize quality of life and symptom management. Patient/health care surrogate gave his/her permission to discuss.Our advance care planning conversation included a discussion about:    The value and importance of advance care planning  Experiences with loved ones who have been seriously ill or have died  Exploration of personal, cultural or spiritual beliefs that might influence medical decisions  Exploration of goals of care in the event of a sudden injury or illness  Identification  of a healthcare agent  Review and updating or creation of an  advance directive document . Decision not to resuscitate or to de-escalate disease focused treatments due to poor prognosis. CODE STATUS:  Symptom Management/Plan:    Follow up Palliative Care Visit: Palliative care will continue to follow for complex medical decision making, advance care planning, and clarification of goals. Return *** weeks or prn.  I  spent *** minutes providing this consultation. More than 50% of the time in this consultation was spent in counseling and care coordination.  This visit was coded based on medical decision making (MDM).***  PPS: ***0%  HOSPICE ELIGIBILITY/DIAGNOSIS: TBD  Chief Complaint: Follow-up palliative visit  HISTORY OF PRESENT ILLNESS:  Anne Rich is a 71 y.o. year old female  with *** .   History obtained from review of EMR, discussion with primary team, and interview with family, facility staff/caregiver and/or [name].  I reviewed available labs, medications, imaging, studies and related documents from the EMR.  Records reviewed and summarized above.   ROS  *** General: NAD EYES: denies vision changes ENMT: denies dysphagia Cardiovascular: denies chest pain, denies DOE Pulmonary: denies cough, denies increased SOB Abdomen: endorses good appetite, denies constipation, endorses continence of bowel GU: denies dysuria, endorses continence of urine MSK:  denies increased weakness,  no falls reported Skin: denies rashes or wounds Neurological: denies pain, denies insomnia Psych: Endorses positive mood Heme/lymph/immuno: denies bruises, abnormal bleeding  Physical Exam: Current and past weights: Constitutional: NAD General: frail appearing, thin/WNWD/obese  EYES: anicteric sclera, lids intact, no discharge  ENMT: intact hearing, oral mucous membranes moist, dentition intact CV: S1S2, RRR, no LE edema Pulmonary: LCTA, no increased work of breathing, no cough, room air Abdomen: intake 100%, normo-active BS + 4 quadrants, soft and non tender, no ascites GU: deferred MSK: no sarcopenia, moves  all extremities, ambulatory Skin: warm and dry, no rashes or wounds on visible skin Neuro:  no generalized weakness,  no cognitive impairment Psych: non-anxious affect, A and O x 3 Hem/lymph/immuno: no widespread bruising CURRENT PROBLEM LIST:  Patient Active Problem List   Diagnosis Date Noted    Current mild episode of major depressive disorder ([place]) 05/23/2021   Ecchymosis 07/27/2020   COVID-19 virus infection 06/20/2019   Medication management 05/29/2019   Severe persistent asthma without complication 18/84/1660   Physical deconditioning 01/06/2019   Counseling regarding end of life decision making 01/06/2019   Lumbosacral dysfunction 09/04/2018   Chronic respiratory failure with hypoxia ([place]), 3L home O2 05/01/2018   Statin intolerance 02/13/2018   Adverse effect of other drugs, medicaments and biological substances, initial encounter 01/01/2018   Bilateral carotid artery stenosis 10/14/2017   Spells of decreased attentiveness 09/10/2017   Cortical age-related cataract of left eye 10/24/2016   Nuclear sclerotic cataract of left eye 10/24/2016   History of epistaxis 07/19/2016   Advance directive in chart 04/04/2016   Idiopathic peripheral neuropathy 03/12/2016   Loss of appetite 03/12/2016   Psychophysiological insomnia 03/12/2016   Oxygen dependent 02/28/2016   Easy bruising 12/26/2015   Gastroesophageal reflux disease 12/02/2015   Coronary artery calcification seen on CT scan 09/20/2015   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[place]) 09/20/2015   Abnormal weight loss 09/06/2015   Herniated lumbar intervertebral disc 07/21/2015   Migraine without aura and without status migrainosus, not intractable 07/21/2015   Anxiety 06/28/2015   Chronic constipation 06/28/2015   Chronic use of benzodiazepine for therapeutic purpose 06/28/2015   Former smoker 06/28/2015   Hepatic steatosis 06/28/2015   History of cerebrovascular accident (CVA) with residual deficit 06/28/2015   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   Hypoxemia 06/28/2015   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/28/2015   Osteopenia 06/28/2015   Unspecified convulsions ([place]) 06/28/2015   Acute poliomyelitis, unspecified 04/18/2015   CVA (cerebral vascular accident) ([place]) 04/18/2015   Essential hypertension 04/18/2015   Other allergic  rhinitis 02/10/2015   Asthma-COPD overlap syndrome ([place]) 02/10/2015   Hip pain 07/08/2013   Melena 04/22/2013   Hypercholesterolemia 02/12/2013   Depressive disorder 11/15/2012   Pain disorders related to psychological factors 11/15/2012   Low back pain 06/18/2012   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   Right foot pain 06/18/2012   Onychomycosis 03/21/2012   Pain, chronic 07/13/2011   Shortness of breath 02/13/2011   PAST MEDICAL HISTORY:  Active Ambulatory Problems    Diagnosis Date Noted   Other allergic rhinitis 02/10/2015   Asthma-COPD overlap syndrome ([place]) 02/10/2015   History of epistaxis 07/19/2016   Abnormal weight loss 09/06/2015   Acute poliomyelitis, unspecified 04/18/2015   Advance directive in chart 04/04/2016   Adverse effect of other drugs, medicaments and biological substances, initial encounter 01/01/2018   Anxiety 06/28/2015   Bilateral carotid artery stenosis 10/14/2017   Low back pain 06/18/2012   Chronic constipation 06/28/2015   Chronic respiratory failure with hypoxia ([place]), 3L home O2 05/01/2018   Chronic use of benzodiazepine for therapeutic purpose 06/28/2015   Coronary artery calcification seen on CT scan 09/20/2015   Cortical age-related cataract of left eye 10/24/2016   CVA (cerebral vascular accident) ([place]) 04/18/2015   Depressive disorder 11/15/2012   Easy bruising 12/26/2015   Essential hypertension 04/18/2015   Former smoker 06/28/2015   Gastroesophageal reflux disease 12/02/2015   Hepatic steatosis 06/28/2015   Herniated lumbar intervertebral disc 07/21/2015   Hip pain 07/08/2013   History of cerebrovascular accident (CVA)  with residual deficit 06/28/2015   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   Hypercholesterolemia 02/12/2013   Hypoxemia 06/28/2015   Idiopathic peripheral neuropathy 03/12/2016   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/28/2015   Loss of appetite 03/12/2016   Lumbosacral dysfunction 09/04/2018   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[place]) 09/20/2015    Melena 04/22/2013   Migraine without aura and without status migrainosus, not intractable 07/21/2015   Nuclear sclerotic cataract of left eye 10/24/2016   Oxygen dependent 02/28/2016   Onychomycosis 03/21/2012   Osteopenia 06/28/2015   Pain disorders related to psychological factors 11/15/2012   Pain, chronic 07/13/2011   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   Psychophysiological insomnia 03/12/2016   Right foot pain 06/18/2012   Shortness of breath 02/13/2011   Spells of decreased attentiveness 09/10/2017   Statin intolerance 02/13/2018   Unspecified convulsions ([place]) 06/28/2015   Physical deconditioning 01/06/2019   Counseling regarding end of life decision making 01/06/2019   Severe persistent asthma without complication 86/57/8469   Medication management 05/29/2019   COVID-19 virus infection 06/20/2019   Ecchymosis 07/27/2020   Current mild episode of major depressive disorder ([place]) 05/23/2021   Resolved Ambulatory Problems    Diagnosis Date Noted   Moderate persistent asthma, uncomplicated 62/95/2841   Restrictive airway disease 12/09/2017   Moderate persistent asthma with acute exacerbation 09/09/2018   Asthma with COPD with exacerbation ([place]) 06/20/2019   Past Medical History:  Diagnosis Date   Allergic rhinitis    Asthma    COPD (chronic obstructive pulmonary disease) ([place])    SOCIAL HX:  Social History   Tobacco Use   Smoking status: Former    Packs/day: 0.50    Years: 15.00    Pack years: 7.50    Types: Cigarettes    Quit date: 06/11/1994    Years since quitting: 27.2   Smokeless tobacco: Never  Substance Use Topics   Alcohol use: No     ALLERGIES:  Allergies  Allergen Reactions   Iodine Itching, Rash and Swelling    IV contrast    Iodine-131 Itching and Swelling    Pt states causes severe itching Pt states causes severe itching    Penicillins Hives and Swelling    Swelling of the throat   Pineapple Other (See Comments) and Swelling    Pt states  causes her throat swelling Pt states causes her throat swelling    Shellfish Allergy Rash and Swelling    Had reaction to cardiac cath dye  Had seizure ,rash ,itch Oct. 2012 Had reaction to cardiac cath dye  Had seizure ,rash ,itch Oct. 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   Pravastatin Other (See Comments)    Pt states she couldn't lift her legs to walk   Shellfish-Derived Products    Xanax [Alprazolam]    Iodinated Contrast Media Rash     PERTINENT MEDICATIONS:  Outpatient Encounter Medications as of 08/17/2021  Medication Sig   albuterol (VENTOLIN HFA) 108 (90 Base) MCG/ACT inhaler INHALE 2 PUFFS BY MOUTH EVERY 4 HOURS AS NEEDED FOR WHEEZE OR FOR SHORTNESS OF BREATH   albuterol (VENTOLIN HFA) 108 (90 Base) MCG/ACT inhaler INHALE 2 PUFFS INTO THE LUNGS EVERY 4 HOURS AS NEEDED FOR WHEEZE OR FOR SHORTNESS OF BREATH   arformoterol (BROVANA) 15 MCG/2ML NEBU Take 2 mLs (15 mcg total) by nebulization 2 (two) times daily. (Patient not taking: Reported on 07/21/2021)   aspirin 81 MG tablet Take 81 mg by  mouth daily.   budesonide (PULMICORT) 0.5 MG/2ML nebulizer solution USE 1 VIAL  IN  NEBULIZER TWICE  DAILY - rinse mouth after treatment   carbamazepine (TEGRETOL) 200 MG tablet Take 200-400 mg by mouth 2 (two) times daily. 259m tab in the morning and 4082mat night   cyclobenzaprine (FLEXERIL) 5 MG tablet Take 5 mg by mouth daily.   DALIRESP 500 MCG TABS tablet TAKE 1 TABLET BY MOUTH EVERY DAY   diclofenac sodium (VOLTAREN) 1 % GEL Apply 2 g topically 4 (four) times daily as needed (knee pain).    EPINEPHrine (EPIPEN 2-PAK) 0.3 mg/0.3 mL IJ SOAJ injection Inject 0.3 mg into the muscle Once PRN.   estradiol (ESTRACE) 1 MG tablet Take 1 mg by mouth daily.   gabapentin (NEURONTIN) 600 MG tablet Take 600-1,200 mg by mouth 2 (two) times daily. 60023mn the morning and 1200m74m bedtime   ipratropium (ATROVENT) 0.02 % nebulizer  solution Take 2.5 mLs (0.5 mg total) by nebulization 4 (four) times daily.   levalbuterol (XOPENEX HFA) 45 MCG/ACT inhaler INHALE 2 PUFFS INTO THE LUNGS EVERY 4 HOURS AS NEEDED FOR WHEEZE   levalbuterol (XOPENEX) 0.63 MG/3ML nebulizer solution INHALE 3 MLS (0.63 MG TOTAL) BY NEBULIZATION EVERY 4 (FOUR) HOURS AS NEEDED. MIX WITH IPRATROPIUM.   levETIRAcetam (KEPPRA) 1000 MG tablet Take 1,000-2,000 mg by mouth 2 (two) times daily. 1000mg45mthe morning and 2000mg 57might   levocetirizine (XYZAL) 5 MG tablet TAKE 1 TABLET (5 MG TOTAL) BY MOUTH DAILY AS NEEDED FOR ALLERGIES (FOR RUNNY NOSE).   lidocaine (LIDODERM) 5 % Place 1 patch onto the skin daily.    lisinopril (PRINIVIL,ZESTRIL) 10 MG tablet Take 10 mg by mouth daily.  (Patient not taking: Reported on 07/21/2021)   lisinopril (ZESTRIL) 5 MG tablet Take 15 mg by mouth daily. (Patient not taking: Reported on 07/03/2021)   meclizine (ANTIVERT) 25 MG tablet Take 25 mg by mouth 2 (two) times daily as needed for dizziness or nausea.    Melatonin 10 MG TABS Take 10 mg by mouth daily.   mirtazapine (REMERON) 30 MG tablet Take 30 mg by mouth every evening.    montelukast (SINGULAIR) 10 MG tablet Take 1 tablet at bedtime.   Multiple Vitamin (MULTIVITAMIN) tablet Take 1 tablet by mouth daily.    omeprazole (PRILOSEC) 40 MG capsule Take 40 mg by mouth 2 (two) times a day.   ondansetron (ZOFRAN) 4 MG tablet Take 1 tablet by mouth every 8 (eight) hours as needed.   ondansetron (ZOFRAN) 4 MG tablet Take 1 tablet (4 mg total) by mouth 2 (two) times daily as needed for nausea.   oxybutynin (DITROPAN) 5 MG tablet Take by mouth.   OXYGEN Inhale 3 L into the lungs at bedtime. 4 L when walking   predniSONE (DELTASONE) 10 MG tablet Take 1.5 tablets (15 mg total) by mouth daily with breakfast.   revefenacin (YUPELRI) 175 MCG/3ML nebulizer solution Inhale 3 mLs (175 mcg total) into the lungs daily.   Facility-Administered Encounter Medications as of 08/17/2021   Medication   omalizumab (XOLAIArvid Rightction 150 mg   Thank you for the opportunity to participate in the care of Ms. Dottavio.  The palliative care team will continue to follow. Please call our office at [phone] can be of additional assistance.   TiffanHollace KinnierCOVID-19 PATIENT SCREENING TOOL Asked and negative response unless otherwise noted:  Have you had symptoms of covid, tested positive or been in contact  with someone with symptoms/positive test in the past 5-10 days? no

## 2021-08-18 ENCOUNTER — Encounter: Payer: Self-pay | Admitting: Internal Medicine

## 2021-08-21 ENCOUNTER — Other Ambulatory Visit: Payer: Self-pay

## 2021-08-21 ENCOUNTER — Other Ambulatory Visit: Payer: Medicare Other

## 2021-08-21 ENCOUNTER — Other Ambulatory Visit: Payer: Medicare Other | Admitting: Internal Medicine

## 2021-08-21 DIAGNOSIS — Z515 Encounter for palliative care: Secondary | ICD-10-CM
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
## 2021-08-22 ENCOUNTER — Other Ambulatory Visit: Payer: Medicare Other

## 2021-08-25 ENCOUNTER — Telehealth: Payer: Self-pay | Admitting: Pulmonary Disease

## 2021-08-25 DIAGNOSIS — J449 Chronic obstructive pulmonary disease, unspecified: Secondary | ICD-10-CM

## 2021-08-25 DIAGNOSIS — J9611 Chronic respiratory failure with hypoxia: Secondary | ICD-10-CM
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
## 2021-08-25 NOTE — Telephone Encounter (Signed)
Called and spoke with patient, advised that an order had been placed for Hospice through [facility], the agency that is already providing palliative care.  She verbalized understanding. ?
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
## 2021-08-28 ENCOUNTER — Telehealth: Payer: Self-pay | Admitting: Pulmonary Disease

## 2021-08-28 NOTE — Telephone Encounter (Signed)
ATC patient. She did not answer. Couldn't leave a voicemail. ?

## 2021-08-28 NOTE — Telephone Encounter (Signed)
Can offer Z-Pak too if she is developing cough with green sputum ?Advised to get COVID tested at home ? ?Increase prednisone to 40 mg.  Reduce dose by 10 mg every 2 days until she reaches her baseline dose of 10 mg/day ?
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
## 2021-08-29 MED ORDER — AZITHROMYCIN 250 MG PO TABS: ORAL_TABLET | ORAL | 0 refills | Status: DC

## 2021-08-29 MED ORDER — PREDNISONE 10 MG PO TABS: ORAL_TABLET | ORAL | 0 refills | Status: DC

## 2021-08-29 NOTE — Telephone Encounter (Signed)
Called and spoke to pt. Informed her of the medication per Dr. Isaiah Serge. Both scripts sent to preferred pharmacy. Pt also states she took a covid test yesterday and was negative. Pt aware to call back if anything further is needed.  ?

## 2021-08-31 DIAGNOSIS — J4540 Moderate persistent asthma, uncomplicated: Secondary | ICD-10-CM

## 2021-09-01 ENCOUNTER — Ambulatory Visit (INDEPENDENT_AMBULATORY_CARE_PROVIDER_SITE_OTHER): Payer: Medicare Other

## 2021-09-01 ENCOUNTER — Other Ambulatory Visit: Payer: Self-pay

## 2021-09-01 DIAGNOSIS — J4540 Moderate persistent asthma, uncomplicated: Secondary | ICD-10-CM | POA: Diagnosis not present

## 2021-09-05 ENCOUNTER — Other Ambulatory Visit: Payer: Self-pay

## 2021-09-05 ENCOUNTER — Other Ambulatory Visit: Payer: Medicare Other

## 2021-09-05 DIAGNOSIS — Z515 Encounter for palliative care: Secondary | ICD-10-CM

## 2021-09-06 NOTE — Progress Notes (Signed)
COMMUNITY PALLIATIVE CARE SW NOTE ? ?PATIENT NAME: Anne Rich ?DOB: 04/29/1951 ?MRN: [id number] ? ?PRIMARY CARE PROVIDER: Doreen Salvage, PA-C ? ?RESPONSIBLE PARTY:  ?Acct ID - Guarantor Home Phone Work Phone Relationship Acct Type  ?[id number] - Mcgaughey,Emaline [phone]  Self P/F  ?   [address], [address], [place], [place] [address]  ? ? ?PC SW completed a face-to-face encounter with patient at her home to provide support, counseling and education. [name] was also present with her. Patient reported that she was feeling much better. Patient stated that her breathing was better.  Patient  advised that she did have a "bad day" on Sunday where she was having trouble breathing, which increased her anxiety. She advised that she took and Ativan, got on to her o2 concentrator and started working a puzzle to calm down and this worked well for her. Patient advised that her physician is evaluating if her anxiety medications to increase it. SW inquired from both how they felt they were getting along. Both advised that they have started counseling with their pastor. They have had 1 session so far and they felt counseling has been helpful. Both agreed that they have some work to do, but they are optimistic about improving. Patient's husband shared that part of his frustration was not understanding patient's overall condition and what it would look like day to day, particularly as he is dealing with his own health issues. SW provided education regarding the difficulty of caregiving, the "roller coaster" affect of the disease. SW also provided education regarding difference between palliative and hospice care. He verbalized understanding and appreciation.  ?SW provided education on safety precautions/interventions ( wearing Alert button & keeping her cell phone near at all times). Patient advised that she and her husband are planning to go to [facility] in the next two weeks.  ?SW to follow-up in a 1 month to assess any  additional needs or concerns.  ? ? ?Duration of visit and documentation: 60 minutes ?Clydia Llano, LCSW ? ?

## 2021-09-14 ENCOUNTER — Ambulatory Visit (INDEPENDENT_AMBULATORY_CARE_PROVIDER_SITE_OTHER): Payer: Medicare Other | Admitting: Allergy & Immunology

## 2021-09-14 ENCOUNTER — Encounter: Payer: Self-pay | Admitting: Allergy & Immunology

## 2021-09-14 VITALS — BP 150/72 | HR 94 | Temp 97.8°F | Resp 16 | Ht 68.0 in | Wt 142.4 lb

## 2021-09-14 DIAGNOSIS — K219 Gastro-esophageal reflux disease without esophagitis: Secondary | ICD-10-CM

## 2021-09-14 DIAGNOSIS — J3089 Other allergic rhinitis: Secondary | ICD-10-CM

## 2021-09-14 DIAGNOSIS — J449 Chronic obstructive pulmonary disease, unspecified: Secondary | ICD-10-CM

## 2021-09-14 DIAGNOSIS — F419 Anxiety disorder, unspecified: Secondary | ICD-10-CM
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
## 2021-09-14 NOTE — Patient Instructions (Addendum)
Moderate persistent asthma with COPD overlap:  ?- Breathing tests today was stable in the 25% range.  ?- Continue with oxygen as needed.  ?- Follow-up with palliative care as initiated by pulmonary. ?- I am fine with changing your medications around, but if your symptoms worsen I want you to call us and we will change this.  ?- Daily controller medication(s):  ?     AM: Pulmicort (budesonide) + Brovana + Yupelri once daily via nebulizer + Dalisrep + prednisone 10mg  ?     PM: Breztri two puffs once daily with spacer ?- Continue with Xolair every two weeks  ?- Rescue medications: Xopenex (levalbuterol) + Atrovent (ipratropium) mixed together every 4-6 hours as needed via nebulizer ?- Asthma control goals:  ?* Full participation in all desired activities (may need albuterol before activity) ?* Albuterol use two time or less a week on average (not counting use with activity) ?* Cough interfering with sleep two time or less a month ?* Oral steroids no more than once a year ?* No hospitalizations ?*Minimize medical procedures  ? ?2. Allergic rhinitis ?- Continue with montelukast 10mg  daily.  ?- Continue with levocetirizine 5mg  daily ?  ?3. Chronic Prednisone Use  ?- Offered a DEXA scan, but patient refused.   ?  ?4. Anxiety/Depression  ?- Recommend continue work with counselors, PCP and .  ?- Continue xanax, recently refilled by Pulmonary  ? ?5. Return in about 4 weeks (around 10/12/2021).  ? ? ?Please inform of any Emergency Department visits, hospitalizations, or changes in symptoms. Call Psychologist, educational before going to the ED for breathing or allergy symptoms since we might be able to fit you in for a sick visit. Feel free to contact 12/12/2021 anytime with any questions, problems, or concerns. ? ?It was a pleasure to see you again today! ? ?Websites that have reliable patient information: ?1. American Academy of Asthma, Allergy, and Immunology: www.aaaai.org ?2. Food Allergy Research and Education (FARE):  foodallergy.org ?3. Mothers of Asthmatics: http://www.asthmacommunitynetwork.org ?4. [place] of Allergy, Asthma, and Immunology: [place] ? ? ?COVID-19 Vaccine Information can be found at: [place] For questions related to vaccine distribution or appointments, please email [email or web address] or call [phone].  ? ?We realize that you might be concerned about having an allergic reaction to the COVID19 vaccines. To help with that concern, WE ARE OFFERING THE COVID19 VACCINES IN OUR OFFICE! Ask the front desk for dates!  ? ? ? ??Like? [email or web address] on Facebook and Instagram for our latest updates!  ?  ? ? ?A healthy democracy works best when [email or web address] participate! Make sure you are registered to vote! If you have moved or changed any of your contact information, you will need to get this updated before voting! ? ?In some cases, you MAY be able to register to vote online: [phone] ? ? ? ? ? ? ? ? ? ?

## 2021-09-15 DIAGNOSIS — J4550 Severe persistent asthma, uncomplicated: Secondary | ICD-10-CM | POA: Diagnosis not present

## 2021-09-18 ENCOUNTER — Ambulatory Visit (INDEPENDENT_AMBULATORY_CARE_PROVIDER_SITE_OTHER): Payer: Medicare Other

## 2021-09-18 DIAGNOSIS — J4550 Severe persistent asthma, uncomplicated: Secondary | ICD-10-CM

## 2021-09-19 ENCOUNTER — Encounter: Payer: Self-pay | Admitting: Allergy & Immunology

## 2021-09-25 ENCOUNTER — Other Ambulatory Visit: Payer: Medicare Other

## 2021-09-25 DIAGNOSIS — Z515 Encounter for palliative care: Secondary | ICD-10-CM
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
## 2021-09-28 ENCOUNTER — Other Ambulatory Visit: Payer: Self-pay | Admitting: Internal Medicine

## 2021-10-03 ENCOUNTER — Telehealth: Payer: Self-pay | Admitting: Allergy & Immunology

## 2021-10-03 NOTE — Telephone Encounter (Signed)
Patient was given a sample of Breztri by Dr. Dellis Anes patient states this medication is working well for her asking for an RX for this medication please advise  ?

## 2021-10-03 NOTE — Telephone Encounter (Signed)
Please advise to rx of breztri thank you ?

## 2021-10-04 DIAGNOSIS — J4550 Severe persistent asthma, uncomplicated: Secondary | ICD-10-CM | POA: Diagnosis not present

## 2021-10-04 MED ORDER — BREZTRI AEROSPHERE 160-9-4.8 MCG/ACT IN AERO: 2.0000 | INHALATION_SPRAY | Freq: Two times a day (BID) | RESPIRATORY_TRACT | 5 refills | Status: DC

## 2021-10-04 NOTE — Telephone Encounter (Signed)
Yes please send in 2 puffs once daily.  She should still be using her nebulized medicines in the morning. ?

## 2021-10-04 NOTE — Telephone Encounter (Signed)
Sent in rx and informed pt 

## 2021-10-05 ENCOUNTER — Ambulatory Visit (INDEPENDENT_AMBULATORY_CARE_PROVIDER_SITE_OTHER): Payer: Medicare Other

## 2021-10-05 DIAGNOSIS — J4550 Severe persistent asthma, uncomplicated: Secondary | ICD-10-CM

## 2021-10-16 ENCOUNTER — Ambulatory Visit: Payer: Medicare Other

## 2021-10-16 ENCOUNTER — Other Ambulatory Visit: Payer: Medicare Other

## 2021-10-16 DIAGNOSIS — Z515 Encounter for palliative care: Secondary | ICD-10-CM

## 2021-10-16 NOTE — Progress Notes (Signed)
COMMUNITY PALLIATIVE CARE SW NOTE ? ?PATIENT NAME: Anne Rich ?DOB: [birth year] ?MRN: [id number] ? ?PRIMARY CARE PROVIDER: Doreen Salvage, PA-C ? ?RESPONSIBLE PARTY:  ?Acct ID - Guarantor Home Phone Work Phone Relationship Acct Type  ?[id number] - [name],[name] [phone]  Self P/F  ?   [address], [address], [place], [place] [address]  ? ? ?SOCIAL WORK TELEPHONIC ENCOUNTER ? ?PC SW completed a telephonic encounter with patient to assess her needs and coping. Patient reported that she was doing much better. She reported that her pain has been managed. She continues to experience intermittent shortness or breath when she has anxiety. She reported that she didn't have any needs at this time as she was getting along fine. She expressed that she thought palliative care visits were supposed to be weekly. SW reiterated to patient the general visit frequencies of the palliative care team and strongly encouraged her to call  when she has concerns or changes in her condition. Patient stated that she did not want to be a bother to the team. SW provided her with reassurance of support when needed. Patient thanked SW for the call and she advised that she remains open to palliative care  and SW support.  ?No other concerns were noted.  ? ? ?Clydia Llano, LCSW ? ?

## 2021-10-18 DIAGNOSIS — J4540 Moderate persistent asthma, uncomplicated: Secondary | ICD-10-CM

## 2021-10-19 ENCOUNTER — Ambulatory Visit: Payer: Medicare Other | Admitting: Internal Medicine

## 2021-10-19 ENCOUNTER — Ambulatory Visit (INDEPENDENT_AMBULATORY_CARE_PROVIDER_SITE_OTHER): Payer: Medicare Other

## 2021-10-19 DIAGNOSIS — Z515 Encounter for palliative care: Secondary | ICD-10-CM

## 2021-10-19 DIAGNOSIS — J4489 Other specified chronic obstructive pulmonary disease: Secondary | ICD-10-CM

## 2021-10-19 DIAGNOSIS — Z638 Other specified problems related to primary support group: Secondary | ICD-10-CM

## 2021-10-19 DIAGNOSIS — M25552 Pain in left hip: Secondary | ICD-10-CM

## 2021-10-19 DIAGNOSIS — F419 Anxiety disorder, unspecified: Secondary | ICD-10-CM

## 2021-10-19 DIAGNOSIS — J4540 Moderate persistent asthma, uncomplicated: Secondary | ICD-10-CM

## 2021-10-19 DIAGNOSIS — J449 Chronic obstructive pulmonary disease, unspecified: Secondary | ICD-10-CM
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
## 2021-10-22 ENCOUNTER — Emergency Department (HOSPITAL_BASED_OUTPATIENT_CLINIC_OR_DEPARTMENT_OTHER)
Admission: EM | Admit: 2021-10-22 | Discharge: 2021-10-22 | Disposition: A | Discharge status: Home / Self Care | Payer: Medicare Other | Attending: Emergency Medicine | Admitting: Emergency Medicine

## 2021-10-22 ENCOUNTER — Encounter (HOSPITAL_BASED_OUTPATIENT_CLINIC_OR_DEPARTMENT_OTHER): Payer: Self-pay | Admitting: Emergency Medicine

## 2021-10-22 ENCOUNTER — Other Ambulatory Visit: Payer: Self-pay

## 2021-10-22 DIAGNOSIS — I2510 Atherosclerotic heart disease of native coronary artery without angina pectoris: Secondary | ICD-10-CM | POA: Insufficient documentation

## 2021-10-22 DIAGNOSIS — B029 Zoster without complications: Secondary | ICD-10-CM

## 2021-10-22 DIAGNOSIS — Z7952 Long term (current) use of systemic steroids: Secondary | ICD-10-CM | POA: Insufficient documentation

## 2021-10-22 DIAGNOSIS — Z7982 Long term (current) use of aspirin: Secondary | ICD-10-CM | POA: Diagnosis not present

## 2021-10-22 DIAGNOSIS — J45909 Unspecified asthma, uncomplicated: Secondary | ICD-10-CM | POA: Diagnosis not present

## 2021-10-22 DIAGNOSIS — J449 Chronic obstructive pulmonary disease, unspecified: Secondary | ICD-10-CM | POA: Diagnosis not present

## 2021-10-22 DIAGNOSIS — R21 Rash and other nonspecific skin eruption: Secondary | ICD-10-CM | POA: Diagnosis present

## 2021-10-22 DIAGNOSIS — Z7951 Long term (current) use of inhaled steroids: Secondary | ICD-10-CM | POA: Diagnosis not present

## 2021-10-22 MED ORDER — VALACYCLOVIR HCL 1 G PO TABS: 1000.0000 mg | ORAL_TABLET | Freq: Three times a day (TID) | ORAL | 0 refills | Status: AC

## 2021-10-22 NOTE — ED Triage Notes (Signed)
Pt reports rash on lower back x 4 days. Was seen by PCP and given acyclovir and clotrimazole cream. Has been using meds x 2 days with no relief. Reports no one told her what the rash was and came here to get answers. Reports itching and discomfort, denies pain.  ?

## 2021-10-22 NOTE — ED Notes (Signed)
Patient has a rash on her hip ,backside and groins area.  States it cmes from wearing a patch for pain. ?

## 2021-10-22 NOTE — Discharge Instructions (Addendum)
You were seen in the emergency department today for rash. ? ?As we discussed your rash looks most consistent with shingles.  We normally treat this with an antiviral medicine.  You should take this 3 times daily.  I have written the prescription for up to 7 days, but you can stop the medicine once all your lesions have crusted over. ? ?You can take Benadryl as needed for itching.  If you do not want to hold dose, you can break the tablet in half and take 12.5 mg. ? ?I recommend following with your primary doctor to make sure that your rash is getting better. ?

## 2021-10-22 NOTE — ED Provider Notes (Signed)
?[facility] EMERGENCY DEPARTMENT ?Provider Note ? ? ?CSN: [id number] ?Arrival date & time: 10/22/21  1353 ? ?  ? ?History ? ?Chief Complaint  ?Patient presents with  ? Rash  ? ? ?Anne Rich is a 71 y.o. female who presents emergency department complaining of a rash on lower back, leg, and groin for the past 4 days.  Patient states that she was seen by her PCP and was given acyclovir, and clotrimazole cream.  Patient states that she has taken 1 dose of the acyclovir, and use the cream once without any relief.  She reports that she was not told what the rash was, and came here to get answers.  She initially noticed it after using some Lidoderm patches given to her by her orthopedist.  She is concerned that it is an allergic reaction from wearing a patch, but the rash has continued.  She reports having shingles in the past, but says this rash looks slightly different than what she had before.  She describes it is painful and itchy, is making it difficult to sleep at night. ? ? ?Rash ?Associated symptoms: no fever   ? ?  ? ?Home Medications ?Prior to Admission medications   ?Medication Sig Start Date End Date Taking? Authorizing Provider  ?valACYclovir (VALTREX) 1000 MG tablet Take 1 tablet (1,000 mg total) by mouth 3 (three) times daily for 7 days. 10/22/21 10/29/21 Yes Arizbeth Cawthorn T, PA-C  ?albuterol (VENTOLIN HFA) 108 (90 Base) MCG/ACT inhaler INHALE 2 PUFFS BY MOUTH EVERY 4 HOURS AS NEEDED FOR WHEEZE OR FOR SHORTNESS OF BREATH 04/10/21   Alfonse Spruce, MD  ?arformoterol [facility]) 15 MCG/2ML NEBU Take 2 mLs (15 mcg total) by nebulization 2 (two) times daily. 06/29/21   Chilton Greathouse, MD  ?aspirin 81 MG tablet Take 81 mg by mouth daily.    [provider]  ?Budeson-Glycopyrrol-Formoterol (BREZTRI AEROSPHERE) 160-9-4.8 MCG/ACT AERO Inhale 2 puffs into the lungs in the morning and at bedtime. 10/04/21   Alfonse Spruce, MD  ?budesonide (PULMICORT) 0.5 MG/2ML nebulizer solution USE 1  VIAL  IN  NEBULIZER TWICE  DAILY - rinse mouth after treatment 06/29/21   Chilton Greathouse, MD  ?carbamazepine (TEGRETOL) 200 MG tablet Take 200-400 mg by mouth 2 (two) times daily. 200mg  tab in the morning and 400mg  at night    [provider]  ?cyclobenzaprine (FLEXERIL) 5 MG tablet Take 5 mg by mouth daily.    [provider]  ?DALIRESP 500 MCG TABS tablet TAKE 1 TABLET BY MOUTH EVERY DAY 06/26/21   Mannam, , MD  ?diclofenac sodium (VOLTAREN) 1 % GEL Apply 2 g topically 4 (four) times daily as needed (knee pain).  11/10/18   [provider]  ?EPINEPHrine (EPIPEN 2-PAK) 0.3 mg/0.3 mL IJ SOAJ injection Inject 0.3 mg into the muscle Once PRN. 03/14/20   01/10/19, MD  ?estradiol (ESTRACE) 1 MG tablet Take 1 mg by mouth daily. 11/19/17   [provider]  ?gabapentin (NEURONTIN) 600 MG tablet Take 600-1,200 mg by mouth 2 (two) times daily. 600mg  in the morning and 1200mg  at bedtime 11/25/18   [provider]  ?ipratropium (ATROVENT) 0.02 % nebulizer solution Take 2.5 mLs (0.5 mg total) by nebulization 4 (four) times daily. 06/29/21   Mannam, , MD  ?levalbuterol (XOPENEX HFA) 45 MCG/ACT inhaler INHALE 2 PUFFS INTO THE LUNGS EVERY 4 HOURS AS NEEDED FOR WHEEZE ?Patient not taking: Reported on 09/14/2021 07/25/21   07/01/21, MD  ?levalbuterol Colbert Coyer)  0.63 MG/3ML nebulizer solution INHALE 3 MLS (0.63 MG TOTAL) BY NEBULIZATION EVERY 4 (FOUR) HOURS AS NEEDED. MIX WITH IPRATROPIUM. 07/31/21   Mannam, Colbert Coyer, MD  ?levETIRAcetam (KEPPRA) 1000 MG tablet Take 1,000-2,000 mg by mouth 2 (two) times daily. 1000mg  in the morning and 2000mg  at night    [provider]  ?levocetirizine (XYZAL) 5 MG tablet TAKE 1 TABLET (5 MG TOTAL) BY MOUTH DAILY AS NEEDED FOR ALLERGIES (FOR RUNNY NOSE). 10/04/21   , MD  ?lidocaine (LIDODERM) 5 % Place 1 patch onto the skin daily.  12/11/18   [provider]  ?lisinopril (PRINIVIL,ZESTRIL) 10 MG tablet  Take 20 mg by mouth daily. 12/10/17   [provider]  ?meclizine (ANTIVERT) 25 MG tablet Take 25 mg by mouth 2 (two) times daily as needed for dizziness or nausea.  01/27/13   [provider]  ?Melatonin 10 MG TABS Take 10 mg by mouth daily.    [provider]  ?mirtazapine (REMERON) 30 MG tablet Take 30 mg by mouth every evening.  12/06/18   [provider]  ?montelukast (SINGULAIR) 10 MG tablet Take 1 tablet at bedtime. 07/03/21   12/08/18, MD  ?Multiple Vitamin (MULTIVITAMIN) tablet Take 1 tablet by mouth daily.     [provider]  ?omeprazole (PRILOSEC) 40 MG capsule Take 40 mg by mouth 2 (two) times a day.    [provider]  ?ondansetron (ZOFRAN) 4 MG tablet Take 1 tablet by mouth every 8 (eight) hours as needed. 06/23/20   [provider]  ?oxybutynin (DITROPAN) 5 MG tablet Take by mouth. 02/16/21   [provider]  ?OXYGEN Inhale 3 L into the lungs at bedtime. 4 L when walking    [provider]  ?predniSONE (DELTASONE) 10 MG tablet Take 1.5 tablets (15 mg total) by mouth daily with breakfast. 04/25/21   04/18/21, MD  ?predniSONE (DELTASONE) 10 MG tablet Take 40mg  for 2 days, then 30mg  for 2 days, 20mg  for 2 days, then continue 10mg  daily ?Patient not taking: Reported on 09/14/2021 08/29/21   , MD  ?QUEtiapine (SEROQUEL) 25 MG tablet Take 25-50 mg by mouth at bedtime. 09/05/21   [provider]  ?revefenacin (YUPELRI) 175 MCG/3ML nebulizer solution Inhale 3 mLs (175 mcg total) into the lungs daily. 07/10/21   , MD  ?   ? ?Allergies    ?Iodine, Iodine-131, Penicillins, Pineapple, Shellfish allergy, Molds & smuts, Pravastatin, Shellfish-derived products, Xanax [alprazolam], and Iodinated contrast media   ? ?Review of Systems   ?Review of Systems  ?Constitutional:  Negative for fever.  ?Skin:  Positive for rash.  ?All other systems reviewed and are negative. ? ?Physical Exam ?Updated  Vital Signs ?BP 131/64 (BP Location: Left Arm)   Pulse 92   Temp 97.8 ?F (36.6 ?C) (Oral)   Resp 17   SpO2 97%  ?Physical Exam ?Vitals and nursing note reviewed.  ?Constitutional:   ?   Appearance: Normal appearance.  ?HENT:  ?   Head: Normocephalic and atraumatic.  ?Eyes:  ?   Conjunctiva/sclera: Conjunctivae normal.  ?Pulmonary:  ?   Effort: Pulmonary effort is normal. No respiratory distress.  ?Skin: ?   General: Skin is warm and dry.  ?   Comments: Rash with clustered vesicles on erythematous base in a dermatomal pattern noted on the left inguinal crease, left hip, and left lower back.  Does not cross midline.  No evidence of overlying infection.  ?Neurological:  ?  Mental Status: She is alert.  ?Psychiatric:     ?   Mood and Affect: Mood normal.     ?   Behavior: Behavior normal.  ? ? ?ED Results / Procedures / Treatments   ?Labs ?(all labs ordered are listed, but only abnormal results are displayed) ?Labs Reviewed - No data to display ? ?EKG ?None ? ?Radiology ?No results found. ? ?Procedures ?Procedures  ? ? ?Medications Ordered in ED ?Medications - No data to display ? ?ED Course/ Medical Decision Making/ A&P ?  ?                        ?Medical Decision Making ?Risk ?Prescription drug management. ? ? ?This patient is a 71 y.o. female who presents to the ED for concern of rash.  ? ?Differential diagnoses prior to evaluation: ?Contact dermatitis, eczema, psoriasis, cellulitis, shingles.  This is not an exhaustive differential. ? ?Past Medical History / Co-morbidities / Social History: ?Asthma, COPD, allergic rhinitis, depression, GERD, CAD, migraines ? ?Physical Exam: ?Physical exam performed. The pertinent findings include: Multiple vesicles on erythematous base in a dermatomal pattern in the left inguinal crease, left hip, and left lower back.  This is consistent with shingles. ?  ?Disposition: ?After consideration of the diagnostic results and the patients response to treatment, I feel that patient's  not requiring admission or inpatient treatment for her symptoms.  Will discharge home with prescription for Valtrex, encourage Benadryl as needed for itching.  Encouraged patient to follow-up with PCP to make su

## 2021-10-25 ENCOUNTER — Encounter: Payer: Self-pay | Admitting: Internal Medicine

## 2021-10-26 ENCOUNTER — Other Ambulatory Visit: Payer: Self-pay | Admitting: Allergy

## 2021-10-27 ENCOUNTER — Telehealth: Payer: Self-pay

## 2021-10-27 NOTE — Telephone Encounter (Signed)
Volunteer check in call for palliative care, no answer 

## 2021-10-30 ENCOUNTER — Ambulatory Visit: Payer: Medicare Other

## 2021-11-01 ENCOUNTER — Other Ambulatory Visit: Payer: Medicare Other

## 2021-11-01 DIAGNOSIS — Z515 Encounter for palliative care: Secondary | ICD-10-CM
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
## 2021-11-02 ENCOUNTER — Ambulatory Visit: Payer: Medicare Other

## 2021-11-02 ENCOUNTER — Ambulatory Visit (INDEPENDENT_AMBULATORY_CARE_PROVIDER_SITE_OTHER): Payer: Medicare Other

## 2021-11-02 ENCOUNTER — Telehealth: Payer: Self-pay | Admitting: Allergy & Immunology

## 2021-11-02 DIAGNOSIS — J4540 Moderate persistent asthma, uncomplicated: Secondary | ICD-10-CM | POA: Diagnosis not present

## 2021-11-02 MED ORDER — ALBUTEROL SULFATE HFA 108 (90 BASE) MCG/ACT IN AERS: INHALATION_SPRAY | RESPIRATORY_TRACT | 1 refills | Status: DC

## 2021-11-02 NOTE — Telephone Encounter (Signed)
Refill for Albuterol HFA x 1  with 1 refill [facility].

## 2021-11-02 NOTE — Telephone Encounter (Signed)
Patient stated needed a rescue inhaler sent to [facility] pharmacy please advise

## 2021-11-09 ENCOUNTER — Telehealth: Payer: Self-pay | Admitting: Pulmonary Disease

## 2021-11-09 ENCOUNTER — Other Ambulatory Visit: Payer: Self-pay | Admitting: Allergy & Immunology

## 2021-11-09 MED ORDER — PREDNISONE 10 MG PO TABS: ORAL_TABLET | ORAL | 0 refills | Status: DC

## 2021-11-09 NOTE — Telephone Encounter (Signed)
Called and spoke to pt. Pt c/o increase in ShOB, slight wheezing (rescue inhaler helped) intermittent chest tightness x 1-2 days. Pt states she feels the high humidity is making things worse. Pt denies cough, f/c/s, swelling. Pt taking Breztri as prescribed and said it has helped. Pt is requesting prednisone. Pt last seen in 07/2021 for asthma/copd and advised to follow up in 4-6 months. Pt has OV with Dr. Isaiah Serge on 6/21. Pt was given zpak and pred taper on 3/20 for acute symptoms (see phone note for more info). Dr. Isaiah Serge is unavailable, will send to Dr. Tonia Brooms.    Dr. Tonia Brooms, please advise. Thanks.

## 2021-11-09 NOTE — Telephone Encounter (Signed)
Called and spoke to pt. Informed her of the recs per Dr. Tonia Brooms. Rx sent to preferred pharmacy. Pt verbalized understanding and denied any further questions or concerns at this time.

## 2021-11-14 ENCOUNTER — Ambulatory Visit: Payer: Medicare Other

## 2021-11-16 ENCOUNTER — Ambulatory Visit: Payer: Medicare Other

## 2021-11-22 ENCOUNTER — Ambulatory Visit: Payer: Medicare Other

## 2021-11-29 ENCOUNTER — Ambulatory Visit (INDEPENDENT_AMBULATORY_CARE_PROVIDER_SITE_OTHER): Payer: Medicare Other | Admitting: Pulmonary Disease

## 2021-11-29 ENCOUNTER — Encounter: Payer: Self-pay | Admitting: Pulmonary Disease

## 2021-11-29 VITALS — BP 128/82 | HR 94 | Ht 68.0 in | Wt 138.4 lb

## 2021-11-29 DIAGNOSIS — J449 Chronic obstructive pulmonary disease, unspecified: Secondary | ICD-10-CM | POA: Diagnosis not present

## 2021-11-29 MED ORDER — PREDNISONE 20 MG PO TABS: ORAL_TABLET | ORAL | 0 refills | Status: DC

## 2021-11-29 MED ORDER — ROFLUMILAST 500 MCG PO TABS: 500.0000 ug | ORAL_TABLET | Freq: Every day | ORAL | 3 refills | Status: DC

## 2021-11-29 MED ORDER — BREZTRI AEROSPHERE 160-9-4.8 MCG/ACT IN AERO: 2.0000 | INHALATION_SPRAY | Freq: Two times a day (BID) | RESPIRATORY_TRACT | 5 refills | Status: DC

## 2021-11-29 MED ORDER — ALBUTEROL SULFATE HFA 108 (90 BASE) MCG/ACT IN AERS: INHALATION_SPRAY | RESPIRATORY_TRACT | 0 refills | Status: DC

## 2021-11-29 NOTE — Patient Instructions (Signed)
We will send in a prescription for prednisone 40 mg a day for 7 days to be held in reserve in case of worsening breathing.  Advised to continue prednisone at 10 mg/day I will send in a tempor renewal ary prescription for Ativan for anxiety.   We will send a renewal for inhalers and Daliresp Follow-up in 3 months.
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
## 2021-12-04 ENCOUNTER — Ambulatory Visit (INDEPENDENT_AMBULATORY_CARE_PROVIDER_SITE_OTHER): Payer: Medicare Other | Admitting: Internal Medicine

## 2021-12-04 VITALS — BP 140/80 | HR 101 | Temp 98.1°F

## 2021-12-04 DIAGNOSIS — J3089 Other allergic rhinitis: Secondary | ICD-10-CM | POA: Diagnosis not present

## 2021-12-04 DIAGNOSIS — Z79899 Other long term (current) drug therapy: Secondary | ICD-10-CM

## 2021-12-04 DIAGNOSIS — Z9981 Dependence on supplemental oxygen: Secondary | ICD-10-CM | POA: Diagnosis not present

## 2021-12-04 DIAGNOSIS — J449 Chronic obstructive pulmonary disease, unspecified: Secondary | ICD-10-CM | POA: Diagnosis not present

## 2021-12-04 DIAGNOSIS — F419 Anxiety disorder, unspecified: Secondary | ICD-10-CM

## 2021-12-04 DIAGNOSIS — K219 Gastro-esophageal reflux disease without esophagitis: Secondary | ICD-10-CM

## 2021-12-04 DIAGNOSIS — J4489 Other specified chronic obstructive pulmonary disease: Secondary | ICD-10-CM

## 2021-12-04 DIAGNOSIS — Z91410 Personal history of adult physical and sexual abuse: Secondary | ICD-10-CM

## 2021-12-04 DIAGNOSIS — Z7952 Long term (current) use of systemic steroids: Secondary | ICD-10-CM

## 2021-12-05 ENCOUNTER — Telehealth: Payer: Self-pay

## 2021-12-05 MED ORDER — AZITHROMYCIN 250 MG PO TABS: ORAL_TABLET | ORAL | 0 refills | Status: DC

## 2021-12-05 NOTE — Telephone Encounter (Signed)
Lm for pt to call us back about this °

## 2021-12-09 ENCOUNTER — Other Ambulatory Visit: Payer: Self-pay | Admitting: Internal Medicine

## 2021-12-09 DIAGNOSIS — R058 Other specified cough: Secondary | ICD-10-CM

## 2021-12-09 MED ORDER — BENZONATATE 100 MG PO CAPS: 200.0000 mg | ORAL_CAPSULE | Freq: Three times a day (TID) | ORAL | 0 refills | Status: DC | PRN

## 2021-12-14 ENCOUNTER — Telehealth: Payer: Self-pay | Admitting: Pulmonary Disease

## 2021-12-14 NOTE — Telephone Encounter (Signed)
Can send script for Zpak.  If not improving, she will need to schedule ROV.

## 2021-12-14 NOTE — Telephone Encounter (Signed)
Spoke with pt and notified of response per Dr Craige Cotta. She states that she just finished zpack 3 days ago. She is fearful that another one is not going to help. She is asking for something different. Please advise, thanks!  Allergies  Allergen Reactions   Iodine Itching, Rash and Swelling    IV contrast    Iodine-131 Itching and Swelling    Pt states causes severe itching Pt states causes severe itching    Penicillins Hives and Swelling    Swelling of the throat   Pineapple Other (See Comments) and Swelling    Pt states causes her throat swelling Pt states causes her throat swelling    Shellfish Allergy Rash and Swelling    Had reaction to cardiac cath dye  Had seizure ,rash ,itch Oct. 2012 Had reaction to cardiac cath dye  Had seizure ,rash ,itch Oct. 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

## 2021-12-14 NOTE — Telephone Encounter (Signed)
ATC patient twice and someone picked up and hung up both times, will try to call later

## 2021-12-14 NOTE — Telephone Encounter (Signed)
I spoke with the pt and notified her of response from Dr Craige Cotta. She is concerned about hearing "rattling" in her chest. OV with Sarah for 9 am tomorrow. Pt advised to seek emergent care sooner if needed.

## 2021-12-14 NOTE — Telephone Encounter (Signed)
Then she doesn't need additional antibiotics at this time.  She can use mucinex 1200 mg bid prn to help with cough.

## 2021-12-14 NOTE — Telephone Encounter (Signed)
Called and spoke with patient who states that she has croupy, productive cough with clear/light green sputum for 2 weeks. Did home covid test and No covid and the allergist sent her for CXR and no pneumonia. Unable to come in as she has to go to court. Would like something to help her as PCP can no longer help her. [facility] [address]    Please advise as Dr. Isaiah Serge is off

## 2021-12-15 ENCOUNTER — Ambulatory Visit (INDEPENDENT_AMBULATORY_CARE_PROVIDER_SITE_OTHER): Payer: Medicare Other | Admitting: Acute Care

## 2021-12-15 ENCOUNTER — Encounter: Payer: Self-pay | Admitting: Acute Care

## 2021-12-15 VITALS — BP 112/64 | HR 94 | Temp 97.8°F | Ht 68.0 in | Wt 137.0 lb

## 2021-12-15 DIAGNOSIS — R059 Cough, unspecified: Secondary | ICD-10-CM

## 2021-12-15 DIAGNOSIS — J441 Chronic obstructive pulmonary disease with (acute) exacerbation: Secondary | ICD-10-CM | POA: Diagnosis not present

## 2021-12-15 MED ORDER — IPRATROPIUM BROMIDE 0.02 % IN SOLN
0.5000 mg | Freq: Four times a day (QID) | RESPIRATORY_TRACT | 3 refills | Status: DC
Start: 2021-12-15 — End: 2021-12-20

## 2021-12-15 MED ORDER — PREDNISONE 10 MG PO TABS: ORAL_TABLET | ORAL | 0 refills | Status: DC

## 2021-12-15 MED ORDER — HYDROCODONE BIT-HOMATROP MBR 5-1.5 MG/5ML PO SOLN: 5.0000 mL | Freq: Four times a day (QID) | ORAL | 0 refills | Status: DC | PRN

## 2021-12-15 NOTE — Patient Instructions (Addendum)
It is good to see you today. I am sorry you are not feeling well.  We will send in a prednisone taper. Prednisone taper; 10 mg tablets: 4 tabs x 2 days, 3 tabs x 2 days, 2 tabs x 2 days then go back to 10 mh daily.  Continue Mucinex 1200 mg twice daily Continue drinking water We will give you a flutter valve today. My nurse will instruct you on use.  Hycodan cough syrup 5 cc at bedtime . We will send in a prescription for your Atrovent Do not drive if sleepy. Try Delsym during the day.  Sips of water instead of throat clearing Sugar Free Coca-Cola or Werther's originals for throat soothing. Delsym Cough syrup 5 cc's every 12 hours. [facility] [phone], refer to this group for carotid surgery Follow up with Dr. Isaiah Serge 03/2022 Please contact office for sooner follow up if symptoms do not improve or worsen or seek emergency care

## 2021-12-15 NOTE — Progress Notes (Deleted)
.  hfa 
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
## 2021-12-15 NOTE — Progress Notes (Signed)
Patient seen in the office today and instructed on use of flutter valve.  Patient expressed understanding and demonstrated technique.  

## 2021-12-19 ENCOUNTER — Telehealth: Payer: Self-pay | Admitting: Pulmonary Disease

## 2021-12-19 NOTE — Telephone Encounter (Signed)
Left message for patient to call back  

## 2021-12-20 ENCOUNTER — Telehealth: Payer: Self-pay | Admitting: Acute Care

## 2021-12-20 ENCOUNTER — Other Ambulatory Visit: Payer: Medicare Other

## 2021-12-20 DIAGNOSIS — Z515 Encounter for palliative care: Secondary | ICD-10-CM

## 2021-12-20 NOTE — Telephone Encounter (Signed)
Bevelyn Ngo, NP  You 1 hour ago (12:22 PM)    This patient has so many medications that are pulmonary on her med list, that I need to know which ones she is taking. Can you call her and fins out of she is using all of these?     Called and spoke with pt to see what medications pt is actually taking and pt said that she has her albuterol inhaler that she uses as needed, has Yupelri, Budesonide, and Brovana solutions that she uses.  Pt said that SG had prescribed ipratropium neb sol at last OV but pt has not been able to get it due to it being on backorder.  I have removed the xopenex inhaler, xopenex neb sol, and breztri off of pt's med list as pt is not taking those meds.   Routing back to [place].

## 2021-12-20 NOTE — Telephone Encounter (Signed)
Fax received from pt's pharmacy stating that the ipratropium neb sol is on backorder and due to this, pt is requesting an alternative to be sent in. Sarah, please advise.

## 2021-12-20 NOTE — Telephone Encounter (Signed)
Bevelyn Ngo, NP  You 59 minutes ago (2:21 PM)    If she is taking Roxy Manns and Budesonise, she does not need the ipitroprium. I ordered it because she asked me to as she was out. She actually does not need it with the medications she is currently on she is covered     Called and spoke with pt letting her know info per SG and she verbalized understanding. Nothing further needed.
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
## 2021-12-26 ENCOUNTER — Other Ambulatory Visit: Payer: Self-pay | Admitting: Pulmonary Disease

## 2021-12-28 ENCOUNTER — Other Ambulatory Visit: Payer: Self-pay | Admitting: Pulmonary Disease

## 2022-01-01 ENCOUNTER — Telehealth: Payer: Self-pay | Admitting: *Deleted

## 2022-01-01 NOTE — Telephone Encounter (Signed)
Error message

## 2022-01-08 ENCOUNTER — Other Ambulatory Visit: Payer: Self-pay | Admitting: *Deleted

## 2022-01-08 DIAGNOSIS — I6523 Occlusion and stenosis of bilateral carotid arteries: Secondary | ICD-10-CM
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
## 2022-01-19 ENCOUNTER — Ambulatory Visit (INDEPENDENT_AMBULATORY_CARE_PROVIDER_SITE_OTHER): Payer: Medicare Other | Admitting: Vascular Surgery

## 2022-01-19 ENCOUNTER — Ambulatory Visit (HOSPITAL_COMMUNITY)
Admission: RE | Admit: 2022-01-19 | Discharge: 2022-01-19 | Disposition: A | Discharge status: Home / Self Care | Payer: Medicare Other | Source: Ambulatory Visit | Attending: Vascular Surgery | Admitting: Vascular Surgery

## 2022-01-19 ENCOUNTER — Encounter: Payer: Self-pay | Admitting: Vascular Surgery

## 2022-01-19 ENCOUNTER — Other Ambulatory Visit: Payer: Self-pay

## 2022-01-19 ENCOUNTER — Ambulatory Visit: Payer: Medicare Other | Admitting: Acute Care

## 2022-01-19 VITALS — BP 145/77 | HR 87 | Temp 97.9°F | Resp 20 | Ht 68.0 in | Wt 144.0 lb

## 2022-01-19 DIAGNOSIS — I6523 Occlusion and stenosis of bilateral carotid arteries: Secondary | ICD-10-CM

## 2022-01-22 ENCOUNTER — Other Ambulatory Visit: Payer: Self-pay | Admitting: Internal Medicine

## 2022-01-24 NOTE — Telephone Encounter (Signed)
Please refer to encounter from 7/12.

## 2022-02-04 ENCOUNTER — Other Ambulatory Visit: Payer: Self-pay | Admitting: Internal Medicine

## 2022-02-09 ENCOUNTER — Telehealth: Payer: Self-pay | Admitting: Pulmonary Disease

## 2022-02-09 DIAGNOSIS — J449 Chronic obstructive pulmonary disease, unspecified: Secondary | ICD-10-CM

## 2022-02-09 DIAGNOSIS — J9611 Chronic respiratory failure with hypoxia: Secondary | ICD-10-CM

## 2022-02-13 NOTE — Telephone Encounter (Signed)
Called and spoke with patient.  Patient is requesting a hospice referral to hospice in [facility].  Patient stated Hospice in [place] was wanting to discontinue all breathing meds including her rescue inhaler.  Patient is concerned and stated she understands she is dying, but does not want to smother.  Patient is requesting hospice as long as long as she can continue current breathing meds/inhalers.    Message routed to Dr. Isaiah Serge to advise on referral

## 2022-02-15 NOTE — Telephone Encounter (Signed)
Called and spoke with patient.  Patient aware hospice referral has been placed.  Nothing further at this time.

## 2022-02-15 NOTE — Telephone Encounter (Signed)
Ok to make a referral to hospice in [place] as requested by the patient.

## 2022-02-15 NOTE — Telephone Encounter (Signed)
Patient is requesting update on her hospice referral.  Message routed to Dr. Isaiah Serge to advise

## 2022-02-15 NOTE — Telephone Encounter (Signed)
Patient is calling to speak with a nurse regarding update on hospice. Please call back at [phone].

## 2022-02-27 ENCOUNTER — Telehealth: Payer: Self-pay

## 2022-02-27 ENCOUNTER — Emergency Department (HOSPITAL_COMMUNITY)
Admission: EM | Admit: 2022-02-27 | Discharge: 2022-02-27 | Discharge status: Against Medical Advice | Payer: Medicare Other | Attending: Emergency Medicine | Admitting: Emergency Medicine

## 2022-02-27 ENCOUNTER — Other Ambulatory Visit: Payer: Self-pay

## 2022-02-27 ENCOUNTER — Emergency Department (HOSPITAL_COMMUNITY): Payer: Medicare Other

## 2022-02-27 ENCOUNTER — Encounter (HOSPITAL_COMMUNITY): Payer: Self-pay | Admitting: Emergency Medicine

## 2022-02-27 DIAGNOSIS — R7309 Other abnormal glucose: Secondary | ICD-10-CM | POA: Diagnosis not present

## 2022-02-27 DIAGNOSIS — R42 Dizziness and giddiness: Secondary | ICD-10-CM | POA: Diagnosis present

## 2022-02-27 DIAGNOSIS — G451 Carotid artery syndrome (hemispheric): Secondary | ICD-10-CM | POA: Insufficient documentation

## 2022-02-27 DIAGNOSIS — Z5321 Procedure and treatment not carried out due to patient leaving prior to being seen by health care provider: Secondary | ICD-10-CM | POA: Diagnosis not present

## 2022-02-27 LAB — COMPREHENSIVE METABOLIC PANEL
ALT: 18 U/L (ref 0–44)
AST: 17 U/L (ref 15–41)
Albumin: 3.7 g/dL (ref 3.5–5.0)
Alkaline Phosphatase: 52 U/L (ref 38–126)
Anion gap: 10 (ref 5–15)
BUN: 5 mg/dL — ABNORMAL LOW (ref 8–23)
CO2: 24 mmol/L (ref 22–32)
Calcium: 8.9 mg/dL (ref 8.9–10.3)
Chloride: 96 mmol/L — ABNORMAL LOW (ref 98–111)
Creatinine, Ser: 0.67 mg/dL (ref 0.44–1.00)
GFR, Estimated: >60 mL/min (ref >60)
Glucose, Bld: 111 mg/dL — ABNORMAL HIGH (ref 70–99)
Potassium: 4.5 mmol/L (ref 3.5–5.1)
Sodium: 130 mmol/L — ABNORMAL LOW (ref 135–145)
Total Bilirubin: 0.2 mg/dL — ABNORMAL LOW (ref 0.3–1.2)
Total Protein: 6.2 g/dL — ABNORMAL LOW (ref 6.5–8.1)

## 2022-02-27 LAB — CBC WITH DIFFERENTIAL/PLATELET
Abs Immature Granulocytes: 0.02 10*3/uL (ref 0.00–0.07)
Basophils Absolute: 0.1 10*3/uL (ref 0.0–0.1)
Basophils Relative: 1 %
Eosinophils Absolute: 0 10*3/uL (ref 0.0–0.5)
Eosinophils Relative: 1 %
HCT: 35.8 % — ABNORMAL LOW (ref 36.0–46.0)
Hemoglobin: 11.9 g/dL — ABNORMAL LOW (ref 12.0–15.0)
Immature Granulocytes: 0 %
Lymphocytes Relative: 18 %
Lymphs Abs: 1.3 10*3/uL (ref 0.7–4.0)
MCH: 31.6 pg (ref 26.0–34.0)
MCHC: 33.2 g/dL (ref 30.0–36.0)
MCV: 95.2 fL (ref 80.0–100.0)
Monocytes Absolute: 0.4 10*3/uL (ref 0.1–1.0)
Monocytes Relative: 5 %
Neutro Abs: 5.1 10*3/uL (ref 1.7–7.7)
Neutrophils Relative %: 75 %
Platelets: 393 10*3/uL (ref 150–400)
RBC: 3.76 MIL/uL — ABNORMAL LOW (ref 3.87–5.11)
RDW: 12.4 % (ref 11.5–15.5)
WBC: 6.9 10*3/uL (ref 4.0–10.5)
nRBC: 0 % (ref 0.0–0.2)

## 2022-02-27 LAB — CBG MONITORING, ED: Glucose-Capillary: 114 mg/dL — ABNORMAL HIGH (ref 70–99)

## 2022-02-27 MED ORDER — ONDANSETRON 4 MG PO TBDP
4.0000 mg | ORAL_TABLET | Freq: Once | ORAL | Status: AC
  Administered 2022-02-27: 4 mg via ORAL
  Filled 2022-02-27: qty 1

## 2022-02-27 NOTE — Telephone Encounter (Signed)
Pt called with c/o worsening dizziness and new onset of stuttering and confusion. She noticed this last week and is concerned. She is c/o weakness in her legs but states she does not know if this has been going on. She is tearful sounding and had difficulty answering some questions pertaining to stroke symptoms. I have advised pt to report to ED and she is in agreement. MD on call will be made aware.

## 2022-02-27 NOTE — ED Provider Triage Note (Signed)
Emergency Medicine Provider Triage Evaluation Note  Anne Rich , a 71 y.o. female  was evaluated in triage.  Pt complains of dizziness.  Patient states that she has been dealing with dizziness for over a month but significantly worsening over the past 3 days.  States that at times she feels like the room is spinning and then other times she feels like she is off balance.  States that with any movement she becomes dizzy and nauseated.  She denies changes to her vision, numbness or tingling to her extremities.  Has noted some intermittent tingling to the left side of her face.  Denies head trauma or falls states that she has been trying to use her home meclizine without improvement in her symptoms.  She has left carotid artery stenosis 60-79%.  Review of Systems  Positive: See above Negative:   Physical Exam  BP (!) 152/78 (BP Location: Right Arm)   Pulse 89   Temp 98 F (36.7 C) (Oral)   Resp 18   Ht 5\' 8"  (1.727 m)   Wt 61.2 kg   SpO2 98%   BMI 20.53 kg/m  Gen:   Awake, ill-appearing Resp:  Normal effort  MSK:   Moves extremities without difficulty  Other:  Has difficulty with extraocular movements.  Seems to have some mild ptosis on the right side.  No nystagmus is noted although she has difficulty with the exam.  Equal strength bilaterally.  Medical Decision Making  Medically screening exam initiated at 11:45 AM.  Appropriate orders placed.  Anne Rich was informed that the remainder of the evaluation will be completed by another provider, this initial triage assessment does not replace that evaluation, and the importance of remaining in the ED until their evaluation is complete.     Mickie Hillier, PA-C 02/27/22 1149

## 2022-02-27 NOTE — ED Triage Notes (Signed)
Patient brought in by POV in wheelchair with close friend c/o waking up this morning feeling dizzy. Reports intermittent stuttering over the past week. Denies any numbness or weakness to one side.

## 2022-02-27 NOTE — ED Notes (Signed)
Patient left without being seen, states she is going somewhere else for care

## 2022-02-28 ENCOUNTER — Telehealth: Payer: Self-pay

## 2022-02-28 NOTE — Telephone Encounter (Signed)
Patient called stating she has been experiencing dizziness for the past 3 days.Also reports speech concerns. Pt went to ED, had CT of Head completed but left due to wait time. I notified Dr. Virl Cagey and he advised that based off of her current sxs, the CT noncontrasted study not very helpful for [place]. He recommends that she reaches out to PCP and if she is having speech concerns she should reach out to her Neurologist for a visit ASAP. I advised pt and she states she has a visit coming up with her PCP so she will address it at her next visit. I advised if her sxs get worse she should call 911 and proceed to ED. Pt voiced her understanding.

## 2022-03-19 ENCOUNTER — Encounter: Payer: Self-pay | Admitting: Pulmonary Disease

## 2022-03-19 ENCOUNTER — Ambulatory Visit (INDEPENDENT_AMBULATORY_CARE_PROVIDER_SITE_OTHER): Payer: Medicare Other | Admitting: Pulmonary Disease

## 2022-03-19 VITALS — BP 130/70 | HR 94 | Temp 97.8°F | Ht 68.0 in | Wt 143.8 lb

## 2022-03-19 DIAGNOSIS — J4489 Other specified chronic obstructive pulmonary disease: Secondary | ICD-10-CM

## 2022-03-19 DIAGNOSIS — I6523 Occlusion and stenosis of bilateral carotid arteries: Secondary | ICD-10-CM

## 2022-03-19 DIAGNOSIS — Z23 Encounter for immunization: Secondary | ICD-10-CM

## 2022-03-19 LAB — COMPREHENSIVE METABOLIC PANEL
ALT: 17 U/L (ref 0–35)
AST: 16 U/L (ref 0–37)
Albumin: 4.2 g/dL (ref 3.5–5.2)
Alkaline Phosphatase: 56 U/L (ref 39–117)
BUN: 10 mg/dL (ref 6–23)
CO2: 29 mEq/L (ref 19–32)
Calcium: 9.2 mg/dL (ref 8.4–10.5)
Chloride: 94 mEq/L — ABNORMAL LOW (ref 96–112)
Creatinine, Ser: 0.69 mg/dL (ref 0.40–1.20)
GFR: 86.78 mL/min (ref >60.00)
Glucose, Bld: 92 mg/dL (ref 70–99)
Potassium: 4.4 mEq/L (ref 3.5–5.1)
Sodium: 131 mEq/L — ABNORMAL LOW (ref 135–145)
Total Bilirubin: 0.3 mg/dL (ref 0.2–1.2)
Total Protein: 6.8 g/dL (ref 6.0–8.3)

## 2022-03-19 MED ORDER — BREZTRI AEROSPHERE 160-9-4.8 MCG/ACT IN AERO: 2.0000 | INHALATION_SPRAY | Freq: Two times a day (BID) | RESPIRATORY_TRACT | 3 refills | Status: DC

## 2022-03-19 MED ORDER — ROFLUMILAST 500 MCG PO TABS: 500.0000 ug | ORAL_TABLET | Freq: Every day | ORAL | 3 refills | Status: DC

## 2022-03-19 MED ORDER — ALBUTEROL SULFATE HFA 108 (90 BASE) MCG/ACT IN AERS
2.0000 | INHALATION_SPRAY | RESPIRATORY_TRACT | 3 refills | Status: DC | PRN
Start: 2022-03-19 — End: 2022-10-30
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
## 2022-03-19 NOTE — Patient Instructions (Signed)
I am glad he is stable with your breathing I am not sure why you are continuing to have dizziness and you may need to follow back with neurology regarding this We will check a basic metabolic panel to monitor sodium levels Continue inhalers Follow-up in 6 months.

## 2022-03-22 ENCOUNTER — Other Ambulatory Visit: Payer: Self-pay | Admitting: Nurse Practitioner

## 2022-03-22 ENCOUNTER — Telehealth: Payer: Self-pay | Admitting: Pulmonary Disease

## 2022-03-22 MED ORDER — PREDNISONE 10 MG PO TABS: ORAL_TABLET | ORAL | 0 refills | Status: DC

## 2022-03-22 MED ORDER — PREDNISONE 10 MG PO TABS: ORAL_TABLET | ORAL | 0 refills | Status: AC

## 2022-03-22 NOTE — Telephone Encounter (Signed)
Rx for prednisone taper has been sent to [facility] for pt. Called and spoke with pt letting her know this had been done and she verbalized understanding. Nothing further needed.

## 2022-03-22 NOTE — Telephone Encounter (Signed)
Called and spoke with pt letting her know recs per [facility] and she verbalized understanding. Since pt was just seen 10/9 by Dr. Vaughan Browner, she did not want to schedule an appt at this time but I did tell her that if the covid test was negative and if she did start getting worse that she would need to be seen and she verbalized understanding. Rx for pred taper has been sent to pharmacy for pt. Nothing further needed.

## 2022-03-22 NOTE — Telephone Encounter (Signed)
Would recommend that she COVID/flu swab. Prednisone taper 10 mg tabs. 4 tabs for 2 days, then 3 tabs for 2 days, 2 tabs for 2 days, then 1 tab for 2 days, then stop. Flonase nasal spray 2 sprays each nostril daily. Saline nasal spray 2-3 times a day. Mucinex [phone] mg Twice daily for congestion. ED precautions. Needs f/u appt.

## 2022-03-22 NOTE — Telephone Encounter (Signed)
Called and spoke with pt who states she began to have symptoms 3 days ago including head congestion, coughing, and is getting up green phlegm. Pt also has increased SOB and states that she has been using her oxygen.  Pt states that she does have a prescription of 10mg  and states that she probably has at least 10 pills left.  Pt has been doing her nebulizer treatments and is using all other medications as prescribed. Pt has also been taking the hycodan cough syrup.  Pt denies any complaints of fever.  Pt wants to know what could be recommended to help with her symptoms.  Katie, please advise.

## 2022-03-22 NOTE — Telephone Encounter (Signed)
Please contact pharmacy. She is having an exacerbation and needs the taper. Her other prescription is for daily use and she will run out of it if she uses it all on the taper before she can get it refilled.

## 2022-03-22 NOTE — Telephone Encounter (Signed)
Called and spoke with pt letting her know that we did send pred taper to [facility]. Let her know that insurance will not pay for Rx but she could pay out of pocket to get this and she verbalized understanding. Nothing further needed.

## 2022-03-23 ENCOUNTER — Emergency Department (HOSPITAL_BASED_OUTPATIENT_CLINIC_OR_DEPARTMENT_OTHER)
Admission: EM | Admit: 2022-03-23 | Discharge: 2022-03-23 | Disposition: A | Discharge status: Home / Self Care | Payer: Medicare Other | Attending: Emergency Medicine | Admitting: Emergency Medicine

## 2022-03-23 ENCOUNTER — Emergency Department (HOSPITAL_BASED_OUTPATIENT_CLINIC_OR_DEPARTMENT_OTHER): Payer: Medicare Other

## 2022-03-23 ENCOUNTER — Encounter (HOSPITAL_BASED_OUTPATIENT_CLINIC_OR_DEPARTMENT_OTHER): Payer: Self-pay | Admitting: *Deleted

## 2022-03-23 ENCOUNTER — Other Ambulatory Visit: Payer: Self-pay

## 2022-03-23 DIAGNOSIS — Z20822 Contact with and (suspected) exposure to covid-19: Secondary | ICD-10-CM | POA: Diagnosis not present

## 2022-03-23 DIAGNOSIS — R051 Acute cough: Secondary | ICD-10-CM | POA: Diagnosis not present

## 2022-03-23 DIAGNOSIS — Z7982 Long term (current) use of aspirin: Secondary | ICD-10-CM | POA: Diagnosis not present

## 2022-03-23 DIAGNOSIS — D72829 Elevated white blood cell count, unspecified: Secondary | ICD-10-CM | POA: Insufficient documentation

## 2022-03-23 DIAGNOSIS — E871 Hypo-osmolality and hyponatremia: Secondary | ICD-10-CM | POA: Diagnosis not present

## 2022-03-23 DIAGNOSIS — R0602 Shortness of breath: Secondary | ICD-10-CM | POA: Diagnosis present

## 2022-03-23 DIAGNOSIS — J441 Chronic obstructive pulmonary disease with (acute) exacerbation: Secondary | ICD-10-CM | POA: Diagnosis not present

## 2022-03-23 LAB — BASIC METABOLIC PANEL
Anion gap: 11 (ref 5–15)
BUN: 12 mg/dL (ref 8–23)
CO2: 25 mmol/L (ref 22–32)
Calcium: 8.9 mg/dL (ref 8.9–10.3)
Chloride: 94 mmol/L — ABNORMAL LOW (ref 98–111)
Creatinine, Ser: 0.63 mg/dL (ref 0.44–1.00)
GFR, Estimated: >60 mL/min (ref >60)
Glucose, Bld: 148 mg/dL — ABNORMAL HIGH (ref 70–99)
Potassium: 3.7 mmol/L (ref 3.5–5.1)
Sodium: 130 mmol/L — ABNORMAL LOW (ref 135–145)

## 2022-03-23 LAB — CBC WITH DIFFERENTIAL/PLATELET
Abs Immature Granulocytes: 0.05 10*3/uL (ref 0.00–0.07)
Basophils Absolute: 0 10*3/uL (ref 0.0–0.1)
Basophils Relative: 0 %
Eosinophils Absolute: 0 10*3/uL (ref 0.0–0.5)
Eosinophils Relative: 0 %
HCT: 37.6 % (ref 36.0–46.0)
Hemoglobin: 12.5 g/dL (ref 12.0–15.0)
Immature Granulocytes: 0 %
Lymphocytes Relative: 5 %
Lymphs Abs: 0.8 10*3/uL (ref 0.7–4.0)
MCH: 30.9 pg (ref 26.0–34.0)
MCHC: 33.2 g/dL (ref 30.0–36.0)
MCV: 93.1 fL (ref 80.0–100.0)
Monocytes Absolute: 0.5 10*3/uL (ref 0.1–1.0)
Monocytes Relative: 3 %
Neutro Abs: 15.6 10*3/uL — ABNORMAL HIGH (ref 1.7–7.7)
Neutrophils Relative %: 92 %
Platelets: 437 10*3/uL — ABNORMAL HIGH (ref 150–400)
RBC: 4.04 MIL/uL (ref 3.87–5.11)
RDW: 12.8 % (ref 11.5–15.5)
WBC: 17 10*3/uL — ABNORMAL HIGH (ref 4.0–10.5)
nRBC: 0 % (ref 0.0–0.2)

## 2022-03-23 LAB — RESP PANEL BY RT-PCR (FLU A&B, COVID) ARPGX2
Influenza A by PCR: NEGATIVE
Influenza B by PCR: NEGATIVE
SARS Coronavirus 2 by RT PCR: NEGATIVE

## 2022-03-23 LAB — TROPONIN I (HIGH SENSITIVITY): Troponin I (High Sensitivity): 2 ng/L (ref <18)

## 2022-03-23 MED ORDER — IPRATROPIUM-ALBUTEROL 0.5-2.5 (3) MG/3ML IN SOLN
3.0000 mL | Freq: Once | RESPIRATORY_TRACT | Status: AC
  Administered 2022-03-23: 3 mL via RESPIRATORY_TRACT
  Filled 2022-03-23: qty 3

## 2022-03-23 MED ORDER — DOXYCYCLINE HYCLATE 100 MG PO CAPS: 100.0000 mg | ORAL_CAPSULE | Freq: Two times a day (BID) | ORAL | 0 refills | Status: DC

## 2022-03-23 MED ORDER — BENZONATATE 100 MG PO CAPS: 100.0000 mg | ORAL_CAPSULE | Freq: Three times a day (TID) | ORAL | 0 refills | Status: DC

## 2022-03-23 MED ORDER — BENZONATATE 100 MG PO CAPS
100.0000 mg | ORAL_CAPSULE | Freq: Once | ORAL | Status: AC
  Administered 2022-03-23: 100 mg via ORAL
  Filled 2022-03-23: qty 1

## 2022-03-23 MED ORDER — DOXYCYCLINE HYCLATE 100 MG PO TABS
100.0000 mg | ORAL_TABLET | Freq: Once | ORAL | Status: AC
  Administered 2022-03-23: 100 mg via ORAL
  Filled 2022-03-23: qty 1

## 2022-03-23 MED ORDER — METHYLPREDNISOLONE SODIUM SUCC 125 MG IJ SOLR
125.0000 mg | Freq: Once | INTRAMUSCULAR | Status: AC
Start: 2022-03-23 — End: 2022-03-23
  Administered 2022-03-23: 125 mg via INTRAVENOUS
  Filled 2022-03-23: qty 2

## 2022-03-23 NOTE — ED Notes (Signed)
Difficult stick , attempted x 2 to obtain the second troponin . Pt refuses too be stuck again . Provider made aware .

## 2022-03-23 NOTE — Progress Notes (Signed)
RT in to give ordered neb tx. Pt being taken for imaging. RT to give tx when pt arrives back to room.

## 2022-03-23 NOTE — ED Triage Notes (Signed)
Here for c/o shortness of breath, wheezing for the past two days

## 2022-03-23 NOTE — Discharge Instructions (Addendum)
Taking the steroids prescribed to you by your pulmonologist.  I started on a course of antibiotics, doxycycline.  Take after eating.  Have also prescribed you a cough medicine.  May also use Flonase  Keep close follow-up with pulmonology  Return for new or worsening symptoms

## 2022-03-23 NOTE — ED Provider Notes (Signed)
[facility]ENC[facility] DEPARTMENT Provider Note   CSN: [id number] Arrival date & time: 03/23/22  1008    History  Chief Complaint  Patient presents with   Shortness of Breath    Anne Rich is a 71 y.o. 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  Wears 2 L via nasal cannula intermittently at home, mostly at night.  Has noted over the last few days she has had multiple coworkers have been sick.  States the office that she has been at has been doing a lot of Architect and the dust particles are flying into the office.  States she has had some congestion, rhinorrhea, cough with green sputum.  Some chest tightness which she feels is consistent with her COPD.  No back pain, left jaw pain, arm pain.  No fever, emesis, numbness, weakness, lower extremity swelling.  No history of PE or DVT.  Discussed with pulmonology yesterday called in a steroid taper.  Took Prednisone 40 mg yesterday. Using home nebs regularly.  HPI     Home Medications Prior to Admission medications   Medication Sig Start Date End Date Taking? Authorizing Provider  benzonatate (TESSALON) 100 MG capsule Take 1 capsule (100 mg total) by mouth every 8 (eight) hours. 03/23/22  Yes Breianna Delfino A, PA-C  doxycycline (VIBRAMYCIN) 100 MG capsule Take 1 capsule (100 mg total) by mouth 2 (two) times daily. 03/23/22  Yes Dracen Reigle A, PA-C  albuterol (VENTOLIN HFA) 108 (90 Base) MCG/ACT inhaler Inhale 2 puffs into the lungs every 4 (four) hours as needed for wheezing or shortness of breath. 03/19/22   Mannam, Hart Robinsons, MD  arformoterol (BROVANA) 15 MCG/2ML NEBU Take 2 mLs (15 mcg total) by nebulization 2 (two) times daily. 06/29/21   Mannam, Hart Robinsons, MD  aspirin 81 MG tablet Take 81 mg by mouth daily.    [provider]  Azelastine  HCl 137 MCG/SPRAY SOLN PLACE 1-2 SPRAYS INTO BOTH NOSTRILS 2 (TWO) TIMES DAILY AS NEEDED (NASAL DRAINAGE). 10/27/21   Valentina Shaggy, MD  BREZTRI AEROSPHERE 160-9-4.8 MCG/ACT AERO Inhale 2 puffs into the lungs in the morning and at bedtime. 03/19/22   Mannam, Praveen, MD  budesonide (PULMICORT) 0.5 MG/2ML nebulizer solution USE 1 VIAL  IN  NEBULIZER TWICE  DAILY - rinse mouth after treatment 06/29/21   Mannam, Hart Robinsons, MD  carbamazepine (TEGRETOL) 200 MG tablet Take 200-400 mg by mouth 2 (two) times daily. 200mg  tab in the morning and 400mg  at night    [provider]  cyclobenzaprine (FLEXERIL) 5 MG tablet Take 5 mg by mouth daily.    [provider]  diclofenac sodium (VOLTAREN) 1 % GEL Apply 2 g topically 4 (four) times daily as needed (knee pain).  11/10/18   [provider]  EPINEPHrine (EPIPEN 2-PAK) 0.3 mg/0.3 mL IJ SOAJ injection Inject 0.3 mg into the muscle Once PRN. 03/14/20   Valentina Shaggy, MD  estradiol (ESTRACE) 1 MG tablet Take 1 mg by mouth daily. 11/19/17   [provider]  gabapentin (NEURONTIN) 600 MG tablet Take 600-1,200 mg by mouth 2 (two) times daily. 600mg  in the morning and 1200mg  at bedtime 11/25/18   [provider]  HYDROcodone bit-homatropine (HYCODAN) 5-1.5 MG/5ML syrup Take 5 mLs by mouth every 6 (six) hours as needed for cough. 12/15/21   Magdalen Spatz, NP  levETIRAcetam (  KEPPRA) 1000 MG tablet Take 1,000-2,000 mg by mouth 2 (two) times daily. 1000mg  in the morning and 2000mg  at night    [provider]  levocetirizine (XYZAL) 5 MG tablet TAKE 1 TABLET (5 MG TOTAL) BY MOUTH DAILY AS NEEDED FOR ALLERGIES (FOR RUNNY NOSE). 02/05/22   Roney Marion, MD  lidocaine (LIDODERM) 5 % Place 1 patch onto the skin daily.  12/11/18   [provider]  lisinopril (PRINIVIL,ZESTRIL) 10 MG tablet Take 20 mg by mouth daily. 12/10/17   [provider]  meclizine (ANTIVERT) 25 MG tablet Take 25 mg by mouth 2 (two)  times daily as needed for dizziness or nausea.  01/27/13   [provider]  Melatonin 10 MG TABS Take 10 mg by mouth daily.    [provider]  mirtazapine (REMERON) 30 MG tablet Take 30 mg by mouth every evening.  12/06/18   [provider]  montelukast (SINGULAIR) 10 MG tablet TAKE 1 TABLET BY MOUTH EVERYDAY AT BEDTIME 01/22/22   Roney Marion, MD  Multiple Vitamin (MULTIVITAMIN) tablet Take 1 tablet by mouth daily.     [provider]  omeprazole (PRILOSEC) 40 MG capsule Take 40 mg by mouth 2 (two) times a day.    [provider]  ondansetron (ZOFRAN) 4 MG tablet TAKE 1 TABLET (4 MG TOTAL) BY MOUTH 2 (TWO) TIMES DAILY AS NEEDED FOR NAUSEA. 12/26/21   Mannam, Hart Robinsons, MD  oxybutynin (DITROPAN) 5 MG tablet Take by mouth. 02/16/21   [provider]  OXYGEN Inhale 3 L into the lungs at bedtime. 4 L when walking    [provider]  predniSONE (DELTASONE) 10 MG tablet Take 10 mg by mouth daily with breakfast.    [provider]  predniSONE (DELTASONE) 10 MG tablet Take 4 tablets (40 mg total) by mouth daily with breakfast for 2 days, THEN 3 tablets (30 mg total) daily with breakfast for 2 days, THEN 2 tablets (20 mg total) daily with breakfast for 2 days, THEN 1 tablet (10 mg total) daily with breakfast for 2 days. 03/22/22 03/29/22  Cobb, Karie Schwalbe, NP  QUEtiapine (SEROQUEL) 25 MG tablet Take 25-50 mg by mouth at bedtime. 09/05/21   [provider]  revefenacin (YUPELRI) 175 MCG/3ML nebulizer solution Inhale 3 mLs (175 mcg total) into the lungs daily. 07/10/21   Mannam, Hart Robinsons, MD  roflumilast (DALIRESP) 500 MCG TABS tablet Take 1 tablet (500 mcg total) by mouth daily. 03/19/22   Mannam, Hart Robinsons, MD      Allergies    Iodine, Iodine-131, Penicillins, Pineapple, Shellfish allergy, Molds & smuts, Pravastatin, Shellfish-derived products, Xanax [alprazolam], and Iodinated contrast media    Review of Systems   Review of Systems   Constitutional: Negative.   HENT:  Positive for congestion, postnasal drip and rhinorrhea.   Respiratory:  Positive for cough, chest tightness, shortness of breath and wheezing.   Gastrointestinal: Negative.   Genitourinary: Negative.   Musculoskeletal: Negative.   Skin: Negative.   Neurological: Negative.   All other systems reviewed and are negative.   Physical Exam Updated Vital Signs BP (!) 142/66   Pulse 94   Temp 97.9 F (36.6 C)   Resp 16   SpO2 93%  Physical Exam Vitals and nursing note reviewed.  Constitutional:      General: She is not in acute distress.    Appearance: She is well-developed. She is not ill-appearing, toxic-appearing or diaphoretic.  HENT:     Head: Normocephalic and atraumatic.  Jaw: There is normal jaw occlusion.  Eyes:     Pupils: Pupils are equal, round, and reactive to light.  Neck:     Trachea: Trachea and phonation normal.     Comments: No stridor Cardiovascular:     Rate and Rhythm: Normal rate.     Pulses: Normal pulses.          Radial pulses are 2+ on the right side and 2+ on the left side.       Dorsalis pedis pulses are 2+ on the right side and 2+ on the left side.     Heart sounds: Normal heart sounds.  Pulmonary:     Effort: No respiratory distress.     Breath sounds: Wheezing present.     Comments: Expiratory wheeze bilateral lower lobes, speaks without difficulty Chest:     Comments: Non tender chest wall Abdominal:     General: Bowel sounds are normal. There is no distension.     Palpations: Abdomen is soft.     Tenderness: There is no abdominal tenderness.  Musculoskeletal:        General: Normal range of motion.     Cervical back: Full passive range of motion without pain and normal range of motion.     Right lower leg: No tenderness. No edema.     Left lower leg: No tenderness. No edema.     Comments: No bony tenderness, full range of motion, compartment soft, Homans' sign negative bilaterally  Skin:     General: Skin is warm and dry.     Capillary Refill: Capillary refill takes less than 2 seconds.     Comments: No edema, erythema or warmth  Neurological:     General: No focal deficit present.     Mental Status: She is alert.  Psychiatric:        Mood and Affect: Mood normal.     ED Results / Procedures / Treatments   Labs (all labs ordered are listed, but only abnormal results are displayed) Labs Reviewed  CBC WITH DIFFERENTIAL/PLATELET - Abnormal; Notable for the following components:      Result Value   WBC 17.0 (*)    Platelets 437 (*)    Neutro Abs 15.6 (*)    All other components within normal limits  BASIC METABOLIC PANEL - Abnormal; Notable for the following components:   Sodium 130 (*)    Chloride 94 (*)    Glucose, Bld 148 (*)    All other components within normal limits  RESP PANEL BY RT-PCR (FLU A&B, COVID) [id number]  TROPONIN I (HIGH SENSITIVITY)  TROPONIN I (HIGH SENSITIVITY)    EKG EKG Interpretation  Date/Time:  Friday March 23 2022 10:21:24 EDT Ventricular Rate:  80 PR Interval:  125 QRS Duration: 85 QT Interval:  324 QTC Calculation: 374 R Axis:   76 Text Interpretation: Sinus rhythm Ventricular bigeminy Nonspecific T abnormalities, lateral leads Nonspecific T wave abnormality Confirmed by Ezequiel Essex [phone]) on 03/23/2022 11:39:46 AM  Radiology DG Chest 2 View  Result Date: 03/23/2022 CLINICAL DATA:  Shortness of breath.  Wheezing. EXAM: CHEST - 2 VIEW COMPARISON:  12/04/2021 FINDINGS: Lungs are hyperinflated. No focal consolidations or pleural effusions. There are mildly prominent interstitial markings. No pulmonary edema. IMPRESSION: Lungs are hyperinflated.  No evidence for acute focal abnormality. Electronically Signed   By: Nolon Nations M.D.   On: 03/23/2022 10:46    Procedures Procedures    Medications Ordered in ED Medications  ipratropium-albuterol (DUONEB) 0.5-2.5 (  3) MG/3ML nebulizer solution 3 mL (3 mLs Nebulization Given  03/23/22 1038)  methylPREDNISolone sodium succinate (SOLU-MEDROL) 125 mg/2 mL injection 125 mg (125 mg Intravenous Given 03/23/22 1058)  ipratropium-albuterol (DUONEB) 0.5-2.5 (3) MG/3ML nebulizer solution 3 mL (3 mLs Nebulization Given 03/23/22 1146)  doxycycline (VIBRA-TABS) tablet 100 mg (100 mg Oral Given 03/23/22 1237)  benzonatate (TESSALON) capsule 100 mg (100 mg Oral Given 03/23/22 1318)    ED Course/ Medical Decision Making/ A&P    71 year old history of intermittent acute hypoxic respiratory failure due to asthma/COPD, chronic steroid use here for evaluation of cough, congestion and shortness of breath.  Symptoms began a few days ago.  Was called in a prednisone steroid pack by her pulmonologist.  She took first dose yesterday of her prednisone.  She has not taken a COVID test at home.  No chest pain, back pain, lower extremity swelling.  Plan on treating COPD exacerbation, labs and imaging.   Labs and imaging personally viewed and interpreted:  CBC leukocytosis 17.0, on steroids Metabolic panel sodium 130, similar to prior, glucose 148.  Rec small fluid bolus for hyponatremia, patient declined Troponin 2, patient declined second troponin COVID, flu negative EKG without ischemic changes Chest x-ray with hyperinflation, no pneumonia, pneumothorax, edema, cardiomegaly  Patient reassessed.  Wheeze has improved however still has wheeze.  Will give additional DuoNeb  Patient reassessed.  Improvement after second DuoNeb.  We will also give doxycycline.  Will ambulate with pulse ox  Patient reassessed.  Ambulated with nurse tech without any hypoxia on RA. Does have supplemental oxygen at home PRN. Encourage close follow-up with pulmonology.  Encourage to continue on the steroids prescribed by pulmonology.  She will turn for new or worsening symptoms.  Sure hyponatremia this has been chronic over the last month.  Did offer fluid bolus here however patient declined.  I encouraged close  follow-up with PCP  I low suspicion for pulmonary edema, pneumothorax, hemothorax, acute ACS, PE, dissection, sepsis.  The patient has been appropriately medically screened and/or stabilized in the ED. I have low suspicion for any other emergent medical condition which would require further screening, evaluation or treatment in the ED or require inpatient management.  Patient is hemodynamically stable and in no acute distress.  Patient able to ambulate in department prior to ED.  Evaluation does not show acute pathology that would require ongoing or additional emergent interventions while in the emergency department or further inpatient treatment.  I have discussed the diagnosis with the patient and answered all questions.  Pain is been managed while in the emergency department and patient has no further complaints prior to discharge.  Patient is comfortable with plan discussed in room and is stable for discharge at this time.  I have discussed strict return precautions for returning to the emergency department.  Patient was encouraged to follow-up with PCP/specialist refer to at discharge.                             Medical Decision Making Amount and/or Complexity of Data Reviewed Independent Historian: spouse External Data Reviewed: labs, radiology, ECG and notes. Labs: ordered. Decision-making details documented in ED Course. Radiology: ordered and independent interpretation performed. Decision-making details documented in ED Course. ECG/medicine tests: ordered and independent interpretation performed. Decision-making details documented in ED Course.  Risk OTC drugs. Prescription drug management. Parenteral controlled substances. Decision regarding hospitalization. Diagnosis or treatment significantly limited by social determinants of health.  Final Clinical Impression(s) / ED Diagnoses Final diagnoses:  COPD exacerbation ([place])  Acute cough  Hyponatremia    Rx /  DC Orders ED Discharge Orders          Ordered    benzonatate (TESSALON) 100 MG capsule  Every 8 hours        03/23/22 1323    doxycycline (VIBRAMYCIN) 100 MG capsule  2 times daily        03/23/22 1323              Alexander Mcauley A, PA-C 03/23/22 1328    Rancour, Annie Main, MD 03/23/22 1648

## 2022-03-26 ENCOUNTER — Telehealth: Payer: Self-pay | Admitting: Pulmonary Disease

## 2022-03-26 NOTE — Telephone Encounter (Signed)
Left message for patient to call back  

## 2022-04-12 ENCOUNTER — Ambulatory Visit (INDEPENDENT_AMBULATORY_CARE_PROVIDER_SITE_OTHER): Payer: Medicare Other | Admitting: Allergy & Immunology

## 2022-04-12 ENCOUNTER — Other Ambulatory Visit: Payer: Self-pay

## 2022-04-12 ENCOUNTER — Encounter: Payer: Self-pay | Admitting: Allergy & Immunology

## 2022-04-12 VITALS — BP 110/76 | HR 102 | Temp 98.2°F | Ht 68.0 in | Wt 146.6 lb

## 2022-04-12 DIAGNOSIS — J4540 Moderate persistent asthma, uncomplicated: Secondary | ICD-10-CM | POA: Diagnosis not present

## 2022-04-12 DIAGNOSIS — I6523 Occlusion and stenosis of bilateral carotid arteries: Secondary | ICD-10-CM | POA: Diagnosis not present

## 2022-04-12 DIAGNOSIS — F32A Depression, unspecified: Secondary | ICD-10-CM

## 2022-04-12 DIAGNOSIS — K219 Gastro-esophageal reflux disease without esophagitis: Secondary | ICD-10-CM

## 2022-04-12 DIAGNOSIS — Z91410 Personal history of adult physical and sexual abuse: Secondary | ICD-10-CM

## 2022-04-12 DIAGNOSIS — Z9981 Dependence on supplemental oxygen: Secondary | ICD-10-CM | POA: Diagnosis not present

## 2022-04-12 DIAGNOSIS — J3089 Other allergic rhinitis: Secondary | ICD-10-CM

## 2022-04-12 DIAGNOSIS — F419 Anxiety disorder, unspecified: Secondary | ICD-10-CM
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
## 2022-04-12 NOTE — Patient Instructions (Addendum)
Moderate persistent asthma with COPD overlap:  - Lung testing looks stable today. - Continue with oxygen as needed.  - Daily controller medication(s):       AM: Pulmicort (budesonide) + Brovana (aformoterol) + Yupelri once daily via nebulizer + Dalisrep 595mcg + prednisone 10mg       PM: Breztri two puffs once daily with spacer - We will look into starting Dupixent (Tammy will reach out to you to discuss this).  - Rescue medications: Xopenex (levalbuterol) + Atrovent (ipratropium) mixed together every 4-6 hours as needed via nebulizer - Asthma control goals:  * Full participation in all desired activities (may need albuterol before activity) * Albuterol use two time or less a week on average (not counting use with activity) * Cough interfering with sleep two time or less a month * Oral steroids no more than once a year * No hospitalizations *Minimize medical procedures   2. Allergic rhinitis - Continue with montelukast 10mg  daily.  - Continue with levocetirizine 5mg  daily   3. Chronic Prednisone Use  - You should get a prednisone course.    4. Anxiety/Depression  - Recommend continue work with counselors, PCP and Dance movement psychotherapist.  - Continue Xanax.  5. Return in about 6 weeks (around 05/24/2022).    Please inform us of any Emergency Department visits, hospitalizations, or changes in symptoms. Call us before going to the ED for breathing or allergy symptoms since we might be able to fit you in for a sick visit. Feel free to contact us anytime with any questions, problems, or concerns.  It was a pleasure to see you again today!  Websites that have reliable patient information: 1. American Academy of Asthma, Allergy, and Immunology: www.aaaai.org 2. Food Allergy Research and Education (FARE): foodallergy.org 3. Mothers of Asthmatics: http://www.asthmacommunitynetwork.org 4. American College of Allergy, Asthma, and Immunology: www.acaai.org   COVID-19 Vaccine Information can be found at:  ShippingScam.co.uk For questions related to vaccine distribution or appointments, please email [email or web address] or call [phone].   We realize that you might be concerned about having an allergic reaction to the COVID19 vaccines. To help with that concern, WE ARE OFFERING THE COVID19 VACCINES IN OUR OFFICE! Ask the front desk for dates!     "Like" [place] on Facebook and Instagram for our latest updates!      A healthy democracy works best when [employer] participate! Make sure you are registered to vote! If you have moved or changed any of your contact information, you will need to get this updated before voting!  In some cases, you MAY be able to register to vote online: [email or web address]

## 2022-04-13 ENCOUNTER — Telehealth: Payer: Self-pay | Admitting: Allergy & Immunology

## 2022-04-13 ENCOUNTER — Encounter: Payer: Self-pay | Admitting: Allergy & Immunology

## 2022-04-13 MED ORDER — YUPELRI 175 MCG/3ML IN SOLN: 175.0000 ug | Freq: Every day | RESPIRATORY_TRACT | 11 refills | Status: DC

## 2022-04-13 MED ORDER — BUDESONIDE 0.5 MG/2ML IN SUSP: RESPIRATORY_TRACT | 3 refills | Status: DC

## 2022-04-13 MED ORDER — IPRATROPIUM BROMIDE 0.02 % IN SOLN: 0.5000 mg | RESPIRATORY_TRACT | 2 refills | Status: DC | PRN

## 2022-04-13 NOTE — Telephone Encounter (Signed)
Let's stick with ipratropium since she uses this for rescue purposes. Can you call and let the patient know that?   Salvatore Marvel, MD [facility]

## 2022-04-13 NOTE — Telephone Encounter (Signed)
Pharmacist Jenny Reichmann) called stating patients INS will not cover Yupelri and Ipratropium together. Pharmacist provided me with this number to call back regarding what they should do with the medications. [phone]

## 2022-04-13 NOTE — Telephone Encounter (Signed)
Called the pharmacy and had them cancel Yupelri and continue with Ipratropium medication. Called and left the voicemail asking for the patient to return call to inform.

## 2022-04-13 NOTE — Telephone Encounter (Signed)
Please advise 

## 2022-04-13 NOTE — Telephone Encounter (Signed)
Patient called back and stated that if we take away the Yupelri then she won't have hardly any medications left to help manage her asthma and listed all of the medications that she should have for her asthma but has a hard time getting filled. I advised that I will call the [facility] and speak with them and figure out what is going on with the asthma medications. Patient verbalized understanding and looks forward to a call next week.

## 2022-04-17 ENCOUNTER — Other Ambulatory Visit: Payer: Self-pay | Admitting: *Deleted

## 2022-04-17 MED ORDER — LEVALBUTEROL HCL 0.63 MG/3ML IN NEBU: 0.6300 mg | INHALATION_SOLUTION | RESPIRATORY_TRACT | 1 refills | Status: DC | PRN

## 2022-04-17 NOTE — Telephone Encounter (Signed)
Per Dr. Gillermina Hu discharge summary she is to do Pulmicort nebulizer solution and Brovana along with Dalisrep and prednisone in the morning. In the evening she is supposed to do [place]. As needed for rescue medications she is supposed to do Xopenex and Atrovent mixed together. I called and spoke with [facility] and they stated that Pulmicort has been on back order for a very long time and they have been unable to get it in and recommend sending it to a local pharmacy. They shipped out the Atrovent today to the patient and have taken care of the [facility] as well. I called and left a voicemail asking for patient to return call to discuss.

## 2022-04-18 ENCOUNTER — Encounter: Payer: Self-pay | Admitting: Family

## 2022-04-18 ENCOUNTER — Ambulatory Visit (INDEPENDENT_AMBULATORY_CARE_PROVIDER_SITE_OTHER): Payer: Medicare Other | Admitting: Family

## 2022-04-18 ENCOUNTER — Telehealth: Payer: Self-pay | Admitting: *Deleted

## 2022-04-18 DIAGNOSIS — Z7952 Long term (current) use of systemic steroids: Secondary | ICD-10-CM | POA: Diagnosis not present

## 2022-04-18 DIAGNOSIS — F419 Anxiety disorder, unspecified: Secondary | ICD-10-CM

## 2022-04-18 DIAGNOSIS — F32A Depression, unspecified: Secondary | ICD-10-CM

## 2022-04-18 DIAGNOSIS — J4489 Other specified chronic obstructive pulmonary disease: Secondary | ICD-10-CM

## 2022-04-18 DIAGNOSIS — J3089 Other allergic rhinitis: Secondary | ICD-10-CM

## 2022-04-18 MED ORDER — LEVALBUTEROL HCL 0.63 MG/3ML IN NEBU: 0.6300 mg | INHALATION_SOLUTION | RESPIRATORY_TRACT | 1 refills | Status: DC | PRN

## 2022-04-18 MED ORDER — PREDNISONE 10 MG PO TABS: ORAL_TABLET | ORAL | 0 refills | Status: DC

## 2022-04-18 NOTE — Telephone Encounter (Signed)
Patient was returning your call and said that she is having trouble with her breathing.  [phone]

## 2022-04-18 NOTE — Patient Instructions (Addendum)
Moderate persistent asthma with COPD overlap: with acute exacerbation -Start prednisone 10 mg taking 2 tablets twice a day for 3 days, then on the 4th day take 2 tablets in the morning, and on the fifth day go back to your prednisone 10 mg once a day. Do not take the daily 10 mg of prednisone while on this prednisone I am sending in - Continue with oxygen as needed.  -Continue to follow up with pulmonary -BRING ALL YOUR MEDICATIONS WITH YOU TO YOUR NEXT APPOINTMENT - If your symptoms do not get better go to the Emergency Room - Daily controller medication(s):       Stop Pulmicort (budesonide) + Brovana (aformoterol) + Yupelri once daily via nebulizer- since she has not had budesonide for 2 months and it is too expensive . Also stop ipratropium bromide via your nebulizer since this is a part of Breztri Continue  Dalisrep + prednisone 10mg   Continue Breztri two puffs twice daily with spacer - We will look into starting Dupixent (Tammy will reach out to you to discuss this).  - Rescue medications: Xopenex (levalbuterol) every 4-6 hours as needed via nebulizer - Asthma control goals:  * Full participation in all desired activities (may need albuterol before activity) * Albuterol use two time or less a week on average (not counting use with activity) * Cough interfering with sleep two time or less a month * Oral steroids no more than once a year * No hospitalizations *Minimize medical procedures   2. Allergic rhinitis - Continue with montelukast 10mg  daily.  - Continue with levocetirizine 5mg  daily   3. Chronic Prednisone Use  - Recommend a DEXA scan   4. Anxiety/Depression  - Recommend continue work with counselors, PCP and .  - Continue Xanax.  5.  Schedule a follow up appointment with Dr. or Dr. next week    Complications of Chronic Steroid Use

## 2022-04-18 NOTE — Telephone Encounter (Signed)
-----   Message from Alfonse Spruce, MD sent at 04/13/2022  6:02 AM EDT ----- Patient wants to change to Dupixent. She did not feel that Xolair was helping.

## 2022-04-18 NOTE — Telephone Encounter (Signed)
Attempted to call patient and was sent straight to voicemail. Left a voicemail asking for patient to return call to discuss.

## 2022-04-18 NOTE — Telephone Encounter (Signed)
Called patient to discuss change over to Dupixent and will need to mail consent forms and get copy of her [facility] card for PAP. Patient also advised that since seeign Dr Dellis Anes last week her breathing is getting worse. In his note it advised she should get prednisone course but nothing send in. She is currently on 10mg  daily and usually with get dose pack and go back to her 10mg . Please advise

## 2022-04-18 NOTE — Telephone Encounter (Signed)
Please have her schedule a televisit or in person visit today.

## 2022-04-18 NOTE — Progress Notes (Signed)
RE: Anne Rich MRN: [id number] DOB: [birth year] Date of Telemedicine Visit: 04/18/2022  Referring provider: Perley Rich Primary care provider: Doreen Salvage, PA-C  Chief Complaint: Breathing Problem and Other (Nose is stopped up)   Telemedicine Follow Up Visit via Telephone: I connected with Anne Rich for a follow up on 04/18/22 by telephone and verified that I am speaking with the correct person using two identifiers.   I discussed the limitations, risks, security and privacy concerns of performing an evaluation and management service by telephone and the availability of in person appointments. I also discussed with the patient that there may be a patient responsible charge related to this service. The patient expressed understanding and agreed to proceed.  Patient is at home Provider is at the office.  Visit start time: 12:10 PM Visit end time: 12:40 PM Insurance consent/check in by: Anne Rich Medical consent and medical assistant/nurse: Anne Rich  History of Present Illness: She is a 71 y.o. female, who is being followed for moderate persistent asthma with COPD overlap, allergic rhinitis, chronic prednisone use, and anxiety/depression. Her previous allergy office visit was on April 12, 2022 with Dr. Dellis Rich.    Moderate persistent asthma: She reports for the past 2 days her breathing has been worse.  She feels like she is coming down with something or like she is allergic to something.  She reports that it is hard to breathe and she has tightness in her chest.  She also wheezes when she takes  all her medications in the morning and then this stops.  She denies cough, nocturnal awakenings due to breathing problems, fever, and chills.  She does wear oxygen at 2 L at night and only wears her oxygen during the day when she is on a treadmill.  There seems to be some confusion as to which medications she is currently taking.  She does mention that she has been out of budesonide for  2 months and she cannot afford for it to be sent to another pharmacy.  It would cost her $2000.00 a month.  At first she mentions that she did not have the Brovana, then later on she mentions that she takes it 2 times a day.  She also says that she takes [place] once a day, Daliresp 500 mcg once a day, prednisone 10 mg in the morning, and Breztri 2 puffs twice a day.  Reviewed with her several times which medications I wanted her to stop and which ones I wanted her to take.  Today she tried using Xopenex and Atrovent via her nebulizer and she could not notice any difference in her breathing.  She mentions that she gets confused as to what medication she should be taking because we tell her something then her pulmonologist tells her something else.  She also feels like some of her medications may be affecting her memory.  She is requesting prednisone and reports that this usually helps when she gets this way.  Discussed the side effects of chronic steroid use and she reports that she "does not care about the side effects of prednisone when she cannot breathe."  Allergic rhinitis: She reports stuffy nose and itchy nose.  She denies rhinorrhea and postnasal drip.  She has not had any sinus infections since we last saw her.  She does take montelukast 10 mg once a day.  She is not certain if she takes levocetirizine 5 mg daily.  Assessment and Plan: Anne Rich is a 71 y.o. female with: Patient Instructions  Moderate persistent asthma with COPD overlap: with acute exacerbation -Start prednisone 10 mg taking 2 tablets twice a day for 3 days, then on the 4th day take 2 tablets in the morning, and on the fifth day go back to your prednisone 10 mg once a day. Do not take the daily 10 mg of prednisone while on this prednisone I am sending in - Continue with oxygen as needed.  -Continue to follow up with pulmonary -BRING ALL YOUR MEDICATIONS WITH YOU TO YOUR NEXT APPOINTMENT - If your symptoms do not get better go to  the Emergency Room - Daily controller medication(s):       Stop Pulmicort (budesonide) + Brovana (aformoterol) + Yupelri once daily via nebulizer- since she has not had budesonide for 2 months and it is too expensive . Also stop ipratropium bromide via your nebulizer since this is a part of Breztri Continue  Dalisrep + prednisone 10mg   Continue Breztri two puffs twice daily with spacer - We will look into starting Dupixent (Anne Rich will reach out to you to discuss this).  - Rescue medications: Xopenex (levalbuterol) every 4-6 hours as needed via nebulizer - Asthma control goals:  * Full participation in all desired activities (may need albuterol before activity) * Albuterol use two time or less a week on average (not counting use with activity) * Cough interfering with sleep two time or less a month * Oral steroids no more than once a year * No hospitalizations *Minimize medical procedures   2. Allergic rhinitis - Continue with montelukast 10mg  daily.  - Continue with levocetirizine 5mg  daily   3. Chronic Prednisone Use  - Recommend a DEXA scan   4. Anxiety/Depression  - Recommend continue work with counselors, PCP and .  - Continue Xanax.  5.  Schedule a follow up appointment with Dr. or Dr. next week    Complications of Chronic Steroid Use               Return in about 1 week (around 04/25/2022), or if symptoms worsen or fail to improve.  Meds ordered this encounter  Medications   predniSONE (DELTASONE) 10 MG tablet    Sig: Take 2 tablets twice for 3 days, then on the 4th day take two tablets in the morning. Then go back to your daily dose of prednisone 10 mg once a day    Dispense:  14 tablet    Refill:  0   levalbuterol (XOPENEX) 0.63 MG/3ML nebulizer solution    Sig: Take 3 mLs (0.63 mg total) by nebulization every 4 (four) hours as needed for wheezing or shortness of breath.    Dispense:  75 mL    Refill:  1   Lab Orders   No laboratory test(s) ordered today    Diagnostics: None.  Medication List:  Current Outpatient Medications  Medication Sig Dispense Refill   albuterol (VENTOLIN HFA) 108 (90 Base) MCG/ACT inhaler Inhale 2 puffs into the lungs every 4 (four) hours as needed for wheezing or shortness of breath. 54 g 3   arformoterol (BROVANA) 15 MCG/2ML NEBU Take 2 mLs (15 mcg total) by nebulization 2 (two) times daily. 120 mL 11   BREZTRI AEROSPHERE 160-9-4.8 MCG/ACT AERO Inhale 2 puffs into the lungs in the morning and at bedtime. 32.1 g 3   budesonide (PULMICORT) 0.5 MG/2ML nebulizer solution USE 1 VIAL  IN  NEBULIZER TWICE  DAILY - rinse mouth after treatment 360 mL 3   carbamazepine (TEGRETOL) 200  MG tablet Take 200-400 mg by mouth 2 (two) times daily. 200mg  tab in the morning and 400mg  at night     cyclobenzaprine (FLEXERIL) 5 MG tablet Take 5 mg by mouth daily.     diclofenac sodium (VOLTAREN) 1 % GEL Apply 2 g topically 4 (four) times daily as needed (knee pain).      EPINEPHrine (EPIPEN 2-PAK) 0.3 mg/0.3 mL IJ SOAJ injection Inject 0.3 mg into the muscle Once PRN. 1 each 2   gabapentin (NEURONTIN) 600 MG tablet Take 600-1,200 mg by mouth 2 (two) times daily. 600mg  in the morning and 1200mg  at bedtime     ipratropium (ATROVENT) 0.02 % nebulizer solution Take 2.5 mLs (0.5 mg total) by nebulization every 4 (four) hours as needed for wheezing or shortness of breath. 75 mL 2   levETIRAcetam (KEPPRA) 1000 MG tablet Take 1,000-2,000 mg by mouth 2 (two) times daily. 1000mg  in the morning and 2000mg  at night     levocetirizine (XYZAL) 5 MG tablet TAKE 1 TABLET (5 MG TOTAL) BY MOUTH DAILY AS NEEDED FOR ALLERGIES (FOR RUNNY NOSE). 90 tablet 0   lisinopril (PRINIVIL,ZESTRIL) 10 MG tablet Take 20 mg by mouth daily.     LORazepam (ATIVAN) 1 MG tablet Take 1 mg by mouth 3 (three) times daily.     meclizine (ANTIVERT) 25 MG tablet Take 25 mg by mouth 2 (two) times daily as needed for dizziness or nausea.       Melatonin 10 MG TABS Take 10 mg by mouth daily.     mirtazapine (REMERON) 30 MG tablet Take 30 mg by mouth every evening.      montelukast (SINGULAIR) 10 MG tablet TAKE 1 TABLET BY MOUTH EVERYDAY AT BEDTIME 90 tablet 1   Multiple Vitamin (MULTIVITAMIN) tablet Take 1 tablet by mouth daily.      omeprazole (PRILOSEC) 40 MG capsule Take 40 mg by mouth 2 (two) times a day.     ondansetron (ZOFRAN) 4 MG tablet TAKE 1 TABLET (4 MG TOTAL) BY MOUTH 2 (TWO) TIMES DAILY AS NEEDED FOR NAUSEA. 20 tablet 1   oxybutynin (DITROPAN) 5 MG tablet Take by mouth.     OXYGEN Inhale 3 L into the lungs at bedtime. 4 L when walking     predniSONE (DELTASONE) 10 MG tablet Take 10 mg by mouth daily with breakfast.     predniSONE (DELTASONE) 10 MG tablet Take 2 tablets twice for 3 days, then on the 4th day take two tablets in the morning. Then go back to your daily dose of prednisone 10 mg once a day 14 tablet 0   revefenacin (YUPELRI) 175 MCG/3ML nebulizer solution Inhale 3 mLs (175 mcg total) into the lungs daily. 90 mL 11   roflumilast (DALIRESP) 500 MCG TABS tablet Take 1 tablet (500 mcg total) by mouth daily. 90 tablet 3   aspirin 81 MG tablet Take 81 mg by mouth daily. (Patient not taking: Reported on 04/18/2022)     Azelastine HCl 137 MCG/SPRAY SOLN PLACE 1-2 SPRAYS INTO BOTH NOSTRILS 2 (TWO) TIMES DAILY AS NEEDED (NASAL DRAINAGE). (Patient not taking: Reported on 04/18/2022) 30 mL 5   benzonatate (TESSALON) 100 MG capsule Take 1 capsule (100 mg total) by mouth every 8 (eight) hours. (Patient not taking: Reported on 04/18/2022) 21 capsule 0   doxycycline (VIBRAMYCIN) 100 MG capsule Take 1 capsule (100 mg total) by mouth 2 (two) times daily. (Patient not taking: Reported on 04/18/2022) 20 capsule 0   estradiol (ESTRACE) 1 MG  tablet Take 1 mg by mouth daily. (Patient not taking: Reported on 04/18/2022)  3   HYDROcodone bit-homatropine (HYCODAN) 5-1.5 MG/5ML syrup Take 5 mLs by mouth every 6 (six) hours as needed for cough.  (Patient not taking: Reported on 04/18/2022) 240 mL 0   levalbuterol (XOPENEX) 0.63 MG/3ML nebulizer solution Take 3 mLs (0.63 mg total) by nebulization every 4 (four) hours as needed for wheezing or shortness of breath. 75 mL 1   lidocaine (LIDODERM) 5 % Place 1 patch onto the skin daily.  (Patient not taking: Reported on 04/18/2022)     QUEtiapine (SEROQUEL) 25 MG tablet Take 25-50 mg by mouth at bedtime. (Patient not taking: Reported on 04/18/2022)     Current Facility-Administered Medications  Medication Dose Route Frequency Provider Last Rate Last Admin   omalizumab Geoffry [place]) injection 150 mg  150 mg Subcutaneous Q14 Days Alfonse Spruce, MD   150 mg at 11/02/21 1009   Allergies: Allergies  Allergen Reactions   Iodine Itching, Rash and Swelling    IV contrast    Iodine-131 Itching and Swelling    Pt states causes severe itching Pt states causes severe itching    Penicillins Hives and Swelling    Swelling of the throat   Pineapple Other (See Comments) and Swelling    Pt states causes her throat swelling Pt states causes her throat swelling    Shellfish Allergy Rash and Swelling    Had reaction to cardiac cath dye  Had seizure ,rash ,itch Oct. 2012 Had reaction to cardiac cath dye  Had seizure ,rash ,itch Oct. 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  Review of Systems  Constitutional:  Negative for chills and fever.       She was tearful during a portion of the telephone visit  HENT:         Reports stuffy nose and itchy nose. Denies rhinorrhea and post nasal drip   Eyes:        Denies itchy watery eyes  Respiratory:  Positive for chest tightness and shortness of breath. Negative for cough and wheezing.   Cardiovascular:  Negative for chest pain.  Gastrointestinal:        Denies heartburn and reflux symptoms  Genitourinary:  Negative for frequency.  Skin:  Negative for rash.  Neurological:  Negative for headaches.   Objective: Physical Exam Not obtained as encounter was done via telephone.   Previous notes and tests were reviewed.  I discussed the assessment and treatment plan with the patient. The patient was provided an opportunity to ask questions and all were answered. The patient agreed with the plan and demonstrated an understanding of the instructions.   The patient was advised to call back or seek an in-person evaluation if the symptoms worsen or if the condition fails to improve as anticipated.  I provided 30 minutes of non-face-to-face time during this encounter.  It was my pleasure to participate in [facility] care today. Please feel free to contact me with any questions or concerns.   Sincerely,  Nehemiah Settle,  FNP  

## 2022-04-18 NOTE — Telephone Encounter (Signed)
After today's televisit. Anne Rich called back stating that they could not give her prednisone at her pharmacy. I called her pharmacy [facility] in [place] on [address] [phone]) Pharmacy stated that the insurance would not cover for it until the 9th. Pharmacy said that they could do a discount code that would bring it down to $11.64 if it is to be dispensed today. I tried to call patient back to inform but it was sent to voicemail. I left a voicemail to call the office back.

## 2022-04-19 ENCOUNTER — Telehealth: Payer: Self-pay

## 2022-04-19 NOTE — Telephone Encounter (Signed)
Patient called in - DOB verified - requested directions/instructions on taking the Prednisone that was prescribed to her by Chrissie.  Reviewed the following directions/instructions with patient:  Start prednisone 10 mg taking 2 tablets twice a day for 3 days, then on the 4th day take 2 tablets in the morning, and on the fifth day go back to your prednisone 10 mg once a day. Do not take the daily 10 mg of prednisone while on this prednisone I am sending in.  Patient advised to make sure she brings ALL of her medications with her at her 1 wk f/u w/Dr. Dellis Anes on 04/26/22 @ 11:30 am.   Patient verbalized understanding, no further questions.

## 2022-04-26 ENCOUNTER — Ambulatory Visit (INDEPENDENT_AMBULATORY_CARE_PROVIDER_SITE_OTHER): Payer: Medicare Other | Admitting: Allergy & Immunology

## 2022-04-26 ENCOUNTER — Encounter: Payer: Self-pay | Admitting: Allergy & Immunology

## 2022-04-26 VITALS — BP 134/80 | Ht 68.0 in | Wt 146.5 lb

## 2022-04-26 DIAGNOSIS — Z7952 Long term (current) use of systemic steroids: Secondary | ICD-10-CM | POA: Diagnosis not present

## 2022-04-26 DIAGNOSIS — Z9981 Dependence on supplemental oxygen: Secondary | ICD-10-CM | POA: Diagnosis not present

## 2022-04-26 DIAGNOSIS — J4489 Other specified chronic obstructive pulmonary disease: Secondary | ICD-10-CM

## 2022-04-26 DIAGNOSIS — I6523 Occlusion and stenosis of bilateral carotid arteries: Secondary | ICD-10-CM | POA: Diagnosis not present

## 2022-04-26 DIAGNOSIS — K219 Gastro-esophageal reflux disease without esophagitis: Secondary | ICD-10-CM

## 2022-04-26 DIAGNOSIS — J3089 Other allergic rhinitis: Secondary | ICD-10-CM | POA: Diagnosis not present

## 2022-04-26 MED ORDER — METHYLPREDNISOLONE ACETATE 80 MG/ML IJ SUSP
80.0000 mg | Freq: Once | INTRAMUSCULAR | Status: AC
  Administered 2022-04-26: 40 mg via INTRAMUSCULAR

## 2022-04-26 NOTE — Patient Instructions (Addendum)
Moderate persistent asthma with COPD overlap:  - Lung testing looks stable today. - Continue with oxygen as needed.  - DepoMedrol injection given today. - We are going to get you on all nebulized medications again. - There HAS to be somewhere that has Pulmicort available for a more affordable price.  - ONCE we have you back on all nebulized meds back in check, we will STOP the Breztri.  - You do NOT want to use Yupelri and ipratropium WITH the Breztri. - Daily controller medication(s):       AM: Pulmicort (budesonide 0.5mg ) + Brovana (aformoterol) + Yupelri + Dalisrep + prednisone 10mg       PM: Pulmicort (budesonide 0.5mg ) + Brovana (aformoterol)  - Tammy is working on getting the approved.  - Rescue medications: Xopenex (levalbuterol) + Atrovent (ipratropium) mixed together every 4-6 hours as needed via nebulizer - Asthma control goals:  * Full participation in all desired activities (may need albuterol before activity) * Albuterol use two time or less a week on average (not counting use with activity) * Cough interfering with sleep two time or less a month * Oral steroids no more than once a year * No hospitalizations *Minimize medical procedures   2. Allergic rhinitis - Continue with montelukast 10mg  daily.  - Continue with levocetirizine 5mg  daily   3. Chronic Prednisone Use  - We need a DEXA scan some time, but you are not interested in this at all.    4. Return in about 2 months (around 06/26/2022).    Please inform of any Emergency Department visits, hospitalizations, or changes in symptoms. Call before going to the ED for breathing or allergy symptoms since we might be able to fit you in for a sick visit. Feel free to contact 06/28/2022 anytime with any questions, problems, or concerns.  It was a pleasure to see you again today!  Websites that have reliable patient information: 1. American Academy of Asthma, Allergy, and Immunology: www.aaaai.org 2. Food  Allergy Research and Education (FARE): foodallergy.org 3. Mothers of Asthmatics: http://www.asthmacommunitynetwork.org 4. American College of Allergy, Asthma, and Immunology: www.acaai.org   COVID-19 Vaccine Information can be found at: [place] For questions related to vaccine distribution or appointments, please email [email or web address] or call [phone].   We realize that you might be concerned about having an allergic reaction to the COVID19 vaccines. To help with that concern, WE ARE OFFERING THE COVID19 VACCINES IN OUR OFFICE! Ask the front desk for dates!     "Like" [place] on Facebook and Instagram for our latest updates!      A healthy democracy works best when [email or web address] participate! Make sure you are registered to vote! If you have moved or changed any of your contact information, you will need to get this updated before voting!  In some cases, you MAY be able to register to vote online: [phone]

## 2022-04-26 NOTE — Telephone Encounter (Signed)
Patient was seen today, Thursday, 04/26/22 by Dr. Dellis Anes.
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
## 2022-04-29 ENCOUNTER — Encounter: Payer: Self-pay | Admitting: Allergy & Immunology

## 2022-04-30 NOTE — Telephone Encounter (Signed)
Anne Rich from [facility] calling us back to let us know she can't fill the yupelri till the first of December because she just got 180 vials of ipratropium filled and brovana one month supply. Would it be ok for her to stay on the breztri until we get all her nebulizer solutions in and working around the insurance?

## 2022-04-30 NOTE — Telephone Encounter (Signed)
Patient calling to say she needs to be put back on the yupelri called it into [facility] and gave her 5 xtra  refills on the yupelri and the Brovana bc her refills would have run out in January 2024. Because her being on yupelri her insurance won't cover the ipratropium or the levalbuterol 0.63 mg patient will have to pay out of pocket for those medications. The ipratropium is 4.50 of a box of 30 and 116.75 dollars for a box of 25.00.

## 2022-05-01 NOTE — Telephone Encounter (Signed)
Yes that is fine! Thank you, Marcelino Duster!   Francis Dowse

## 2022-05-07 ENCOUNTER — Telehealth: Payer: Self-pay | Admitting: Allergy & Immunology

## 2022-05-07 ENCOUNTER — Encounter: Payer: Self-pay | Admitting: Family Medicine

## 2022-05-07 ENCOUNTER — Ambulatory Visit (INDEPENDENT_AMBULATORY_CARE_PROVIDER_SITE_OTHER): Payer: Medicare Other | Admitting: Family Medicine

## 2022-05-07 DIAGNOSIS — R058 Other specified cough: Secondary | ICD-10-CM | POA: Diagnosis not present

## 2022-05-07 DIAGNOSIS — Z9981 Dependence on supplemental oxygen: Secondary | ICD-10-CM

## 2022-05-07 DIAGNOSIS — J4489 Other specified chronic obstructive pulmonary disease: Secondary | ICD-10-CM

## 2022-05-07 DIAGNOSIS — K219 Gastro-esophageal reflux disease without esophagitis: Secondary | ICD-10-CM | POA: Diagnosis not present

## 2022-05-07 DIAGNOSIS — Z9189 Other specified personal risk factors, not elsewhere classified: Secondary | ICD-10-CM | POA: Insufficient documentation

## 2022-05-07 DIAGNOSIS — R059 Cough, unspecified: Secondary | ICD-10-CM | POA: Insufficient documentation

## 2022-05-07 MED ORDER — BENZONATATE 100 MG PO CAPS: 100.0000 mg | ORAL_CAPSULE | Freq: Three times a day (TID) | ORAL | 0 refills | Status: DC

## 2022-05-07 NOTE — Progress Notes (Signed)
RE: Anne Rich MRN: [id number] DOB: 1951-02-16 Date of Telemedicine Visit: 05/07/2022  Referring provider: Emelia Loron Primary care provider: Egbert Garibaldi, PA-C  Chief Complaint: Cough   Telemedicine Follow Up Visit via Telephone: I connected with Anne Rich for a follow up on 05/07/22 by telephone and verified that I am speaking with the correct person using two identifiers.   I discussed the limitations, risks, security and privacy concerns of performing an evaluation and management service by telephone and the availability of in person appointments. I also discussed with the patient that there may be a patient responsible charge related to this service. The patient expressed understanding and agreed to proceed.  Patient is at home  Provider is at the office.  Visit start time: 258 Visit end time: [place] consent/check in by: [facility] consent and medical assistant/nurse: Devion Chriscoe  History of Present Illness: She is a 71 y.o. female, who is being followed for asthma/COPD with chronic prednisone use, nighttime oxygen dependence, allergic rhinitis, and reflux. Her previous allergy office visit was on 04/26/2022 with Dr. Ernst Bowler. At today's visit, she reports that people in her building are beginning to decorate and there has been an increase in dust that is causing her to cough. She reports some shortness of breath, wheeze, and cough producing clear phlegm. She reports that she did have out of town guests over recently. She denies fever, sweats, chills and sick contacts. She continues Pulmicort, aformoterol, Yupelri, dapiresp, and prednisone 10 mg every morning and in the evening she uses Pulmicort aformoterol, and montelukast. She continues Xopenex 2-3 times a day. She continues night time oxygen at 3 LNC and uses 4 LNC during exercise. She yesterday she took tessalon Perles 100 mg with relief of cough. She is currently waiting on a decision regarding Dupixent for asthma  control. Allergic rhintiis is reported as moderately well controlled with symptoms including clear rhinorrhea, sneezing, and post nasal drainage. She continues levocetirizine with moderate relief of symptoms. Reflux is reported as moderately well controlled with omeprazole once a day. She reports that she frequently eats food late at night just before bed. Her current medications are listed in the chart.   Assessment and Plan: Anne Rich is a 71 y.o. female with: Patient Instructions  Asthma Daily controller medication(s):       AM: Pulmicort (budesonide 0.5mg ) + Brovana (aformoterol) + Yupelri + Dalisrep 548mcg + prednisone 10mg  + montelukast 10 mg      PM: Pulmicort (budesonide 0.5mg ) + Brovana (aformoterol)  As needed asthma medication-Continue Xopenex once every 4-6 hours as needed Consider a Dexa scan to evaluate the strength of your bones.   Oxygen dependence Continue oxygen via nasal canula at night and during exercise Continue to follow up with your pulmonary specialist as recommended  Cough Begin Tessalon Perles 100 mg up to three times a day as needed for cough Call the clinic if your symptoms worsen or if you develop a fever  COVID Take an at home COVID test. Call the clinic if positive.  Begin to mask if you develop any symptoms including fever, body aches or chills  Allergic rhinitis Continue allergen avoidance measures directed toward mold and dust mites as listed below Continue levocetirizine 5 mg once a day as needed for a runny nose or itch Continue Flonase 2 sprays in each nostril once a day as needed for a stuffy nose Consider saline nasal rinses as needed for nasal symptoms. Use this before any medicated nasal sprays for best result  Reflux Continue dietary and lifestyle modifications as listed below Continue omeprazole once a day as previously prescribed. Take this medication 30 minutes before your first meal for best results.   Call the clinic if this treatment  plan is not working well for you  Follow up in 2 weeks or sooner if needed.    Return in about 2 weeks (around 05/21/2022), or if symptoms worsen or fail to improve.  Meds ordered this encounter  Medications   benzonatate (TESSALON) 100 MG capsule    Sig: Take 1 capsule (100 mg total) by mouth every 8 (eight) hours.    Dispense:  21 capsule    Refill:  0     Medication List:  Current Outpatient Medications  Medication Sig Dispense Refill   albuterol (VENTOLIN HFA) 108 (90 Base) MCG/ACT inhaler Inhale 2 puffs into the lungs every 4 (four) hours as needed for wheezing or shortness of breath. 54 g 3   arformoterol (BROVANA) 15 MCG/2ML NEBU Take 2 mLs (15 mcg total) by nebulization 2 (two) times daily. 120 mL 11   benzonatate (TESSALON) 100 MG capsule Take 1 capsule (100 mg total) by mouth every 8 (eight) hours. 21 capsule 0   BREZTRI AEROSPHERE 160-9-4.8 MCG/ACT AERO Inhale 2 puffs into the lungs in the morning and at bedtime. 32.1 g 3   budesonide (PULMICORT) 0.5 MG/2ML nebulizer solution USE 1 VIAL  IN  NEBULIZER TWICE  DAILY - rinse mouth after treatment 360 mL 3   carbamazepine (TEGRETOL) 200 MG tablet Take 200-400 mg by mouth 2 (two) times daily. 200mg  tab in the morning and 400mg  at night     cyclobenzaprine (FLEXERIL) 5 MG tablet Take 5 mg by mouth daily.     diclofenac sodium (VOLTAREN) 1 % GEL Apply 2 g topically 4 (four) times daily as needed (knee pain).      EPINEPHrine (EPIPEN 2-PAK) 0.3 mg/0.3 mL IJ SOAJ injection Inject 0.3 mg into the muscle Once PRN. 1 each 2   estradiol (ESTRACE) 1 MG tablet Take 1 mg by mouth daily.  3   gabapentin (NEURONTIN) 600 MG tablet Take 600-1,200 mg by mouth 2 (two) times daily. 600mg  in the morning and 1200mg  at bedtime     ipratropium (ATROVENT) 0.02 % nebulizer solution Take 2.5 mLs (0.5 mg total) by nebulization every 4 (four) hours as needed for wheezing or shortness of breath. (Patient not taking: Reported on 04/26/2022) 75 mL 2    levalbuterol (XOPENEX) 0.63 MG/3ML nebulizer solution Take 3 mLs (0.63 mg total) by nebulization every 4 (four) hours as needed for wheezing or shortness of breath. (Patient not taking: Reported on 04/26/2022) 75 mL 1   levETIRAcetam (KEPPRA) 1000 MG tablet Take 1,000-2,000 mg by mouth 2 (two) times daily. 1000mg  in the morning and 2000mg  at night     levocetirizine (XYZAL) 5 MG tablet TAKE 1 TABLET (5 MG TOTAL) BY MOUTH DAILY AS NEEDED FOR ALLERGIES (FOR RUNNY NOSE). 90 tablet 0   lisinopril (PRINIVIL,ZESTRIL) 10 MG tablet Take 20 mg by mouth daily.     LORazepam (ATIVAN) 1 MG tablet Take 1 mg by mouth 3 (three) times daily.     meclizine (ANTIVERT) 25 MG tablet Take 25 mg by mouth 2 (two) times daily as needed for dizziness or nausea.      Melatonin 10 MG TABS Take 10 mg by mouth daily.     mirtazapine (REMERON) 30 MG tablet Take 30 mg by mouth every evening.  montelukast (SINGULAIR) 10 MG tablet TAKE 1 TABLET BY MOUTH EVERYDAY AT BEDTIME 90 tablet 1   Multiple Vitamin (MULTIVITAMIN) tablet Take 1 tablet by mouth daily.      omeprazole (PRILOSEC) 40 MG capsule Take 40 mg by mouth 2 (two) times a day.     ondansetron (ZOFRAN) 4 MG tablet TAKE 1 TABLET (4 MG TOTAL) BY MOUTH 2 (TWO) TIMES DAILY AS NEEDED FOR NAUSEA. 20 tablet 1   oxybutynin (DITROPAN) 5 MG tablet Take by mouth.     OXYGEN Inhale 3 L into the lungs at bedtime. 4 L when walking     predniSONE (DELTASONE) 10 MG tablet Take 10 mg by mouth daily with breakfast.     QUEtiapine (SEROQUEL) 25 MG tablet Take 25-50 mg by mouth at bedtime.     revefenacin (YUPELRI) 175 MCG/3ML nebulizer solution Inhale 3 mLs (175 mcg total) into the lungs daily. (Patient not taking: Reported on 04/26/2022) 90 mL 11   roflumilast (DALIRESP) 500 MCG TABS tablet Take 1 tablet (500 mcg total) by mouth daily. 90 tablet 3   Current Facility-Administered Medications  Medication Dose Route Frequency Provider Last Rate Last Admin   omalizumab Arvid Right) injection  150 mg  150 mg Subcutaneous Q14 Days Valentina Shaggy, MD   150 mg at 11/02/21 1009   Allergies: Allergies  Allergen Reactions   Iodine Itching, Rash and Swelling    IV contrast    Iodine-131 Itching and Swelling    Pt states causes severe itching Pt states causes severe itching    Penicillins Hives and Swelling    Swelling of the throat   Pineapple Other (See Comments) and Swelling    Pt states causes her throat swelling Pt states causes her throat swelling    Shellfish Allergy Rash and Swelling    Had reaction to cardiac cath dye  Had seizure ,rash ,itch Oct. 2012 Had reaction to cardiac cath dye  Had seizure ,rash ,itch Oct. 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  Review of Systems Objective: Physical Exam Not obtained as encounter was done via telephone.   Previous notes and tests were reviewed.  I discussed the assessment and treatment plan with the patient. The patient was provided an opportunity to ask questions and all were answered. The patient agreed with the plan and demonstrated an understanding of the instructions.   The patient was advised to call back or seek an in-person evaluation if the symptoms worsen or if the condition fails to improve as anticipated.  I provided 27 minutes of non-face-to-face time during this encounter.  It was my pleasure to participate in Anne Rich care today. Please feel free to contact me with any questions or concerns.   Sincerely,  Gareth Morgan, FNP

## 2022-05-07 NOTE — Telephone Encounter (Signed)
Patient called and stated that she has a cough and would like a prescription for benzonatate called into her pharmacy. Patients pharmacy is [facility] on [address]. Patient is requesting a call back about this. Patients call back number is [phone]

## 2022-05-07 NOTE — Patient Instructions (Signed)
Asthma Daily controller medication(s):       AM: Pulmicort (budesonide 0.5mg ) + Brovana (aformoterol) + Yupelri + Dalisrep + prednisone 10mg  + montelukast 10 mg      PM: Pulmicort (budesonide 0.5mg ) + Brovana (aformoterol)  As needed asthma medication-Continue Xopenex once every 4-6 hours as needed Consider a Dexa scan to evaluate the strength of your bones.   Oxygen dependence Continue oxygen via nasal canula at night and during exercise Continue to follow up with your pulmonary specialist as recommended  Cough Begin Tessalon Perles 100 mg up to three times a day as needed for cough Call the clinic if your symptoms worsen or if you develop a fever  COVID Take an at home COVID test. Call the clinic if positive.  Begin to mask if you develop any symptoms including fever, body aches or chills  Allergic rhinitis Continue allergen avoidance measures directed toward mold and dust mites as listed below Continue levocetirizine 5 mg once a day as needed for a runny nose or itch Continue Flonase 2 sprays in each nostril once a day as needed for a stuffy nose Consider saline nasal rinses as needed for nasal symptoms. Use this before any medicated nasal sprays for best result   Reflux Continue dietary and lifestyle modifications as listed below Continue omeprazole once a day as previously prescribed. Take this medication 30 minutes before your first meal for best results.   Call the clinic if this treatment plan is not working well for you  Follow up in 2 weeks or sooner if needed.   Control of Dust Mite Allergen Dust mites play a major role in allergic asthma and rhinitis. They occur in environments with high humidity wherever human skin is found. Dust mites absorb humidity from the atmosphere (ie, they do not drink) and feed on organic matter (including shed human and animal skin). Dust mites are a microscopic type of insect that you cannot see with the naked eye. High levels of  dust mites have been detected from mattresses, pillows, carpets, upholstered furniture, bed covers, clothes, soft toys and any woven material. The principal allergen of the dust mite is found in its feces. A gram of dust may contain 1,000 mites and 250,000 fecal particles. Mite antigen is easily measured in the air during house cleaning activities. Dust mites do not bite and do not cause harm to humans, other than by triggering allergies/asthma.  Ways to decrease your exposure to dust mites in your home:  1. Encase mattresses, box springs and pillows with a mite-impermeable barrier or cover  2. Wash sheets, blankets and drapes weekly in hot water (130 F) with detergent and dry them in a dryer on the hot setting.  3. Have the room cleaned frequently with a vacuum cleaner and a damp dust-mop. For carpeting or rugs, vacuuming with a vacuum cleaner equipped with a high-efficiency particulate air (HEPA) filter. The dust mite allergic individual should not be in a room which is being cleaned and should wait 1 hour after cleaning before going into the room.  4. Do not sleep on upholstered furniture (eg, couches).  5. If possible removing carpeting, upholstered furniture and drapery from the home is ideal. Horizontal blinds should be eliminated in the rooms where the person spends the most time (bedroom, study, television room). Washable vinyl, roller-type shades are optimal.  6. Remove all non-washable stuffed toys from the bedroom. Wash stuffed toys weekly like sheets and blankets above.  7. Reduce indoor humidity to less  than 50%. Inexpensive humidity monitors can be purchased at most hardware stores. Do not use a humidifier as can make the problem worse and are not recommended.  Control of Mold Allergen Mold and fungi can grow on a variety of surfaces provided certain temperature and moisture conditions exist.  Outdoor molds grow on plants, decaying vegetation and soil.  The major outdoor mold,  Alternaria and Cladosporium, are found in very high numbers during hot and dry conditions.  Generally, a late Summer - Fall peak is seen for common outdoor fungal spores.  Rain will temporarily lower outdoor mold spore count, but counts rise rapidly when the rainy period ends.  The most important indoor molds are Aspergillus and Penicillium.  Dark, humid and poorly ventilated basements are ideal sites for mold growth.  The next most common sites of mold growth are the bathroom and the kitchen.  Outdoor [employer] Use air conditioning and keep windows closed Avoid exposure to decaying vegetation. Avoid leaf raking. Avoid grain handling. Consider wearing a face mask if working in moldy areas.  Indoor Mold Control Maintain humidity below 50%. Clean washable surfaces with 5% bleach solution. Remove sources e.g. Contaminated carpets.

## 2022-05-08 NOTE — Telephone Encounter (Signed)
Thanks much! I signed some paperwork yesterday to try to get her medications more affordable. We will see how that works.  Malachi Bonds, MD [facility]

## 2022-05-11 ENCOUNTER — Encounter (HOSPITAL_BASED_OUTPATIENT_CLINIC_OR_DEPARTMENT_OTHER): Payer: Self-pay | Admitting: Emergency Medicine

## 2022-05-11 ENCOUNTER — Telehealth: Payer: Self-pay | Admitting: Pulmonary Disease

## 2022-05-11 ENCOUNTER — Emergency Department (HOSPITAL_BASED_OUTPATIENT_CLINIC_OR_DEPARTMENT_OTHER): Payer: Medicare Other

## 2022-05-11 ENCOUNTER — Emergency Department (HOSPITAL_BASED_OUTPATIENT_CLINIC_OR_DEPARTMENT_OTHER)
Admission: EM | Admit: 2022-05-11 | Discharge: 2022-05-11 | Disposition: A | Discharge status: Home / Self Care | Payer: Medicare Other | Attending: Emergency Medicine | Admitting: Emergency Medicine

## 2022-05-11 ENCOUNTER — Other Ambulatory Visit: Payer: Self-pay

## 2022-05-11 DIAGNOSIS — J441 Chronic obstructive pulmonary disease with (acute) exacerbation: Secondary | ICD-10-CM | POA: Diagnosis not present

## 2022-05-11 DIAGNOSIS — R0602 Shortness of breath: Secondary | ICD-10-CM | POA: Diagnosis present

## 2022-05-11 DIAGNOSIS — Z79899 Other long term (current) drug therapy: Secondary | ICD-10-CM | POA: Diagnosis not present

## 2022-05-11 DIAGNOSIS — I2510 Atherosclerotic heart disease of native coronary artery without angina pectoris: Secondary | ICD-10-CM | POA: Diagnosis not present

## 2022-05-11 DIAGNOSIS — J45909 Unspecified asthma, uncomplicated: Secondary | ICD-10-CM | POA: Diagnosis not present

## 2022-05-11 DIAGNOSIS — Z8673 Personal history of transient ischemic attack (TIA), and cerebral infarction without residual deficits: Secondary | ICD-10-CM | POA: Insufficient documentation

## 2022-05-11 DIAGNOSIS — Z1152 Encounter for screening for COVID-19: Secondary | ICD-10-CM | POA: Diagnosis not present

## 2022-05-11 DIAGNOSIS — Z7902 Long term (current) use of antithrombotics/antiplatelets: Secondary | ICD-10-CM | POA: Insufficient documentation

## 2022-05-11 LAB — COMPREHENSIVE METABOLIC PANEL
ALT: 20 U/L (ref 0–44)
AST: 21 U/L (ref 15–41)
Albumin: 3.7 g/dL (ref 3.5–5.0)
Alkaline Phosphatase: 79 U/L (ref 38–126)
Anion gap: 9 (ref 5–15)
BUN: 8 mg/dL (ref 8–23)
CO2: 30 mmol/L (ref 22–32)
Calcium: 8.8 mg/dL — ABNORMAL LOW (ref 8.9–10.3)
Chloride: 98 mmol/L (ref 98–111)
Creatinine, Ser: 0.86 mg/dL (ref 0.44–1.00)
GFR, Estimated: >60 mL/min (ref >60)
Glucose, Bld: 111 mg/dL — ABNORMAL HIGH (ref 70–99)
Potassium: 3.5 mmol/L (ref 3.5–5.1)
Sodium: 137 mmol/L (ref 135–145)
Total Bilirubin: 0.2 mg/dL — ABNORMAL LOW (ref 0.3–1.2)
Total Protein: 6.7 g/dL (ref 6.5–8.1)

## 2022-05-11 LAB — CBC
HCT: 36.7 % (ref 36.0–46.0)
Hemoglobin: 12.4 g/dL (ref 12.0–15.0)
MCH: 31.3 pg (ref 26.0–34.0)
MCHC: 33.8 g/dL (ref 30.0–36.0)
MCV: 92.7 fL (ref 80.0–100.0)
Platelets: 425 10*3/uL — ABNORMAL HIGH (ref 150–400)
RBC: 3.96 MIL/uL (ref 3.87–5.11)
RDW: 12.5 % (ref 11.5–15.5)
WBC: 8.7 10*3/uL (ref 4.0–10.5)
nRBC: 0 % (ref 0.0–0.2)

## 2022-05-11 LAB — RESP PANEL BY RT-PCR (FLU A&B, COVID) ARPGX2
Influenza A by PCR: NEGATIVE
Influenza B by PCR: NEGATIVE
SARS Coronavirus 2 by RT PCR: NEGATIVE

## 2022-05-11 LAB — TROPONIN I (HIGH SENSITIVITY): Troponin I (High Sensitivity): 3 ng/L (ref <18)

## 2022-05-11 MED ORDER — ALBUTEROL SULFATE HFA 108 (90 BASE) MCG/ACT IN AERS
2.0000 | INHALATION_SPRAY | RESPIRATORY_TRACT | Status: DC | PRN
  Administered 2022-05-11: 2 via RESPIRATORY_TRACT
  Filled 2022-05-11: qty 6.7

## 2022-05-11 MED ORDER — METHYLPREDNISOLONE SODIUM SUCC 125 MG IJ SOLR
125.0000 mg | INTRAMUSCULAR | Status: AC
  Administered 2022-05-11: 125 mg via INTRAVENOUS
  Filled 2022-05-11: qty 2

## 2022-05-11 MED ORDER — IPRATROPIUM-ALBUTEROL 0.5-2.5 (3) MG/3ML IN SOLN
3.0000 mL | RESPIRATORY_TRACT | Status: AC
  Administered 2022-05-11: 3 mL via RESPIRATORY_TRACT
  Filled 2022-05-11: qty 3

## 2022-05-11 MED ORDER — ALBUTEROL SULFATE (2.5 MG/3ML) 0.083% IN NEBU
2.5000 mg | INHALATION_SOLUTION | RESPIRATORY_TRACT | Status: AC
  Administered 2022-05-11: 2.5 mg via RESPIRATORY_TRACT
  Filled 2022-05-11: qty 3

## 2022-05-11 MED ORDER — AZITHROMYCIN 250 MG PO TABS: 250.0000 mg | ORAL_TABLET | Freq: Every day | ORAL | 0 refills | Status: DC

## 2022-05-11 MED ORDER — ACETAMINOPHEN 500 MG PO TABS
1000.0000 mg | ORAL_TABLET | Freq: Once | ORAL | Status: AC
  Administered 2022-05-11: 1000 mg via ORAL
  Filled 2022-05-11: qty 2

## 2022-05-11 MED ORDER — PREDNISONE 10 MG (21) PO TBPK: ORAL_TABLET | Freq: Every day | ORAL | 0 refills | Status: DC

## 2022-05-11 MED ORDER — MAGNESIUM SULFATE IN D5W 1-5 GM/100ML-% IV SOLN: 1.0000 g | Freq: Once | INTRAVENOUS | Status: DC

## 2022-05-11 MED ORDER — MAGNESIUM SULFATE 2 GM/50ML IV SOLN
2.0000 g | Freq: Once | INTRAVENOUS | Status: AC
  Administered 2022-05-11: 2 g via INTRAVENOUS
  Filled 2022-05-11: qty 50

## 2022-05-11 NOTE — ED Notes (Signed)
Pt discharged to home. Discharge instructions have been discussed with patient and/or family members. Pt verbally acknowledges understanding d/c instructions, and endorses comprehension to checkout at registration before leaving.  °

## 2022-05-11 NOTE — Telephone Encounter (Signed)
Pt called the office not feeling well. Stated that her roommate has pna and tried to get her to go to the doctor but she did not want to go at that time. Pt is coughing and having problems with her breathing. I stated to her that she needed to go be evaluated especially with the weekend coming up and she verbalized understanding. Nothing further needed.

## 2022-05-11 NOTE — Discharge Instructions (Signed)
Take these medications as prescribed.  I have also written you a prescription for an antibiotic azithromycin which also helps with inflammation and should help with your breathing.   Follow up with your pulmonologist.   Return to ER as needed.

## 2022-05-11 NOTE — ED Provider Notes (Signed)
[facility] EMERGENCY DEPARTMENT Provider Note   CSN: [id number] Arrival date & time: 05/11/22  1613     History  Chief Complaint  Patient presents with   Shortness of Breath    Anne Rich is a 71 y.o. female.   Shortness of Breath  Patient is a 71 year old female with past medical history significant for asthma COPD overlap syndrome, chronic respiratory failure with chronic 3 L home O2 patient states she only uses at night, anxiety, carotid artery stenosis on Plavix, chronic constipation, CAD, stroke, allergies  Patient states for the past 10 days she has been having more shortness of breath wheezing fatigue and coughing.  She denies any chest pain.  She states that she feels like her breathing is worse than it usually is.  She has been using her inhaler she states she has several.  She is followed by pulmonology Dr. [name].  She denies any leg swelling either unilateral or bilateral.  No nausea or vomiting.  Does state that she feels that she has had perhaps slightly more congestion than usual.  No recent surgeries, hospitalization, long travel, hemoptysis, estrogen containing OCP, cancer history.  No unilateral leg swelling.  No history of PE or VTE.      Home Medications Prior to Admission medications   Medication Sig Start Date End Date Taking? Authorizing Provider  albuterol (VENTOLIN HFA) 108 (90 Base) MCG/ACT inhaler Inhale 2 puffs into the lungs every 4 (four) hours as needed for wheezing or shortness of breath. 03/19/22   Mannam, Colbert Coyer, MD  arformoterol (BROVANA) 15 MCG/2ML NEBU Take 2 mLs (15 mcg total) by nebulization 2 (two) times daily. 06/29/21   Mannam, Colbert Coyer, MD  azithromycin (ZITHROMAX) 250 MG tablet Take 1 tablet (250 mg total) by mouth daily. Take first 2 tablets together, then 1 every day until finished. 05/11/22   Gailen Shelter, PA  benzonatate (TESSALON) 100 MG capsule Take 1 capsule (100 mg total) by mouth every 8 (eight) hours. 05/07/22    Ambs, Norvel Richards, FNP  BREZTRI AEROSPHERE 160-9-4.8 MCG/ACT AERO Inhale 2 puffs into the lungs in the morning and at bedtime. 03/19/22   Mannam, Colbert Coyer, MD  budesonide (PULMICORT) 0.5 MG/2ML nebulizer solution USE 1 VIAL  IN  NEBULIZER TWICE  DAILY - rinse mouth after treatment 04/13/22   Alfonse Spruce, MD  carbamazepine (TEGRETOL) 200 MG tablet Take 200-400 mg by mouth 2 (two) times daily. 200mg  tab in the morning and 400mg  at night    [provider]  cyclobenzaprine (FLEXERIL) 5 MG tablet Take 5 mg by mouth daily.    [provider]  diclofenac sodium (VOLTAREN) 1 % GEL Apply 2 g topically 4 (four) times daily as needed (knee pain).  11/10/18   [provider]  EPINEPHrine (EPIPEN 2-PAK) 0.3 mg/0.3 mL IJ SOAJ injection Inject 0.3 mg into the muscle Once PRN. 03/14/20   01/10/19, MD  estradiol (ESTRACE) 1 MG tablet Take 1 mg by mouth daily. 11/19/17   [provider]  gabapentin (NEURONTIN) 600 MG tablet Take 600-1,200 mg by mouth 2 (two) times daily. 600mg  in the morning and 1200mg  at bedtime 11/25/18   [provider]  ipratropium (ATROVENT) 0.02 % nebulizer solution Take 2.5 mLs (0.5 mg total) by nebulization every 4 (four) hours as needed for wheezing or shortness of breath. Patient not taking: Reported on 04/26/2022 04/13/22   , MD  levalbuterol 11/27/18) 0.63 MG/3ML nebulizer solution Take 3 mLs (0.63 mg total)  by nebulization every 4 (four) hours as needed for wheezing or shortness of breath. Patient not taking: Reported on 04/26/2022 04/18/22   Althea Charon, FNP  levETIRAcetam (KEPPRA) 1000 MG tablet Take 1,000-2,000 mg by mouth 2 (two) times daily. 1000mg  in the morning and 2000mg  at night    [provider]  levocetirizine (XYZAL) 5 MG tablet TAKE 1 TABLET (5 MG TOTAL) BY MOUTH DAILY AS NEEDED FOR ALLERGIES (FOR RUNNY NOSE). 02/05/22   Roney Marion, MD  lisinopril (PRINIVIL,ZESTRIL) 10 MG tablet Take  20 mg by mouth daily. 12/10/17   [provider]  LORazepam (ATIVAN) 1 MG tablet Take 1 mg by mouth 3 (three) times daily. 03/08/22   [provider]  meclizine (ANTIVERT) 25 MG tablet Take 25 mg by mouth 2 (two) times daily as needed for dizziness or nausea.  01/27/13   [provider]  Melatonin 10 MG TABS Take 10 mg by mouth daily.    [provider]  mirtazapine (REMERON) 30 MG tablet Take 30 mg by mouth every evening.  12/06/18   [provider]  montelukast (SINGULAIR) 10 MG tablet TAKE 1 TABLET BY MOUTH EVERYDAY AT BEDTIME 01/22/22   Roney Marion, MD  Multiple Vitamin (MULTIVITAMIN) tablet Take 1 tablet by mouth daily.     [provider]  omeprazole (PRILOSEC) 40 MG capsule Take 40 mg by mouth 2 (two) times a day.    [provider]  ondansetron (ZOFRAN) 4 MG tablet TAKE 1 TABLET (4 MG TOTAL) BY MOUTH 2 (TWO) TIMES DAILY AS NEEDED FOR NAUSEA. 12/26/21   Mannam, Hart Robinsons, MD  oxybutynin (DITROPAN) 5 MG tablet Take by mouth. 02/16/21   [provider]  OXYGEN Inhale 3 L into the lungs at bedtime. 4 L when walking    [provider]  predniSONE (DELTASONE) 10 MG tablet Take 10 mg by mouth daily with breakfast.    [provider]  predniSONE (STERAPRED UNI-PAK 21 TAB) 10 MG (21) TBPK tablet Take by mouth daily. Take 6 tabs by mouth daily  for 2 days, then 5 tabs for 2 days, then 4 tabs for 2 days, then 3 tabs for 2 days, 2 tabs for 2 days, then 1 tab by mouth daily for 2 days 05/11/22   Pati Gallo S, PA  QUEtiapine (SEROQUEL) 25 MG tablet Take 25-50 mg by mouth at bedtime. 09/05/21   [provider]  revefenacin (YUPELRI) 175 MCG/3ML nebulizer solution Inhale 3 mLs (175 mcg total) into the lungs daily. Patient not taking: Reported on 04/26/2022 04/13/22   Valentina Shaggy, MD  roflumilast (DALIRESP) 500 MCG TABS tablet Take 1 tablet (500 mcg total) by mouth daily. 03/19/22   Mannam, Hart Robinsons, MD       Allergies    Iodine, Iodine-131, Penicillins, Pineapple, Shellfish allergy, Molds & smuts, Pravastatin, Shellfish-derived products, Xanax [alprazolam], and Iodinated contrast media    Review of Systems   Review of Systems  Respiratory:  Positive for shortness of breath.     Physical Exam Updated Vital Signs BP (!) 123/56   Pulse 89   Temp 98 F (36.7 C) (Oral)   Resp 20   SpO2 93%  Physical Exam Vitals and nursing note reviewed.  Constitutional:      General: She is not in acute distress.    Comments: Pleasant well-appearing 71 year old.  In no acute distress.  Sitting comfortably in bed.  Able answer questions appropriately follow commands. No increased work of breathing. Speaking in full sentences.  HENT:     Head: Normocephalic and atraumatic.     Nose: Nose normal.  Eyes:     General: No scleral icterus. Cardiovascular:     Rate and Rhythm: Normal rate and regular rhythm.     Pulses: Normal pulses.     Heart sounds: Normal heart sounds.  Pulmonary:     Effort: Pulmonary effort is normal. No respiratory distress.     Breath sounds: Wheezing present.  Abdominal:     Palpations: Abdomen is soft.     Tenderness: There is no abdominal tenderness.  Musculoskeletal:     Cervical back: Normal range of motion.     Right lower leg: No edema.     Left lower leg: No edema.  Skin:    General: Skin is warm and dry.     Capillary Refill: Capillary refill takes less than 2 seconds.  Neurological:     Mental Status: She is alert. Mental status is at baseline.  Psychiatric:        Mood and Affect: Mood normal.        Behavior: Behavior normal.     ED Results / Procedures / Treatments   Labs (all labs ordered are listed, but only abnormal results are displayed) Labs Reviewed  CBC - Abnormal; Notable for the following components:      Result Value   Platelets 425 (*)    All other components within normal limits  COMPREHENSIVE METABOLIC PANEL - Abnormal; Notable for the  following components:   Glucose, Bld 111 (*)    Calcium 8.8 (*)    Total Bilirubin 0.2 (*)    All other components within normal limits  RESP PANEL BY RT-PCR (FLU A&B, COVID) ARPGX2  TROPONIN I (HIGH SENSITIVITY)    EKG EKG Interpretation  Date/Time:  Friday May 11 2022 16:29:58 EST Ventricular Rate:  101 PR Interval:  120 QRS Duration: 76 QT Interval:  330 QTC Calculation: 427 R Axis:   75 Text Interpretation: Sinus tachycardia Nonspecific T wave abnormality Abnormal ECG When compared with ECG of 23-Mar-2022 10:21, PREVIOUS ECG IS PRESENT Confirmed by Nanda Quinton [id number]) on 05/11/2022 4:32:34 PM  Radiology DG Chest 2 View  Result Date: 05/11/2022 CLINICAL DATA:  Short of breath for 10 days EXAM: CHEST - 2 VIEW COMPARISON:  03/23/2022 FINDINGS: Frontal and lateral views of the chest demonstrate a stable cardiac silhouette. Stable background emphysema without airspace disease, effusion, or pneumothorax. There are no acute bony abnormalities. IMPRESSION: 1. Stable emphysema.  No acute process. Electronically Signed   By: Randa Ngo M.D.   On: 05/11/2022 16:49    Procedures Procedures    Medications Ordered in ED Medications  albuterol (VENTOLIN HFA) 108 (90 Base) MCG/ACT inhaler 2 puff (2 puffs Inhalation Given 05/11/22 1645)  magnesium sulfate IVPB 1 g 100 mL (1 g Intravenous Not Given 05/11/22 1859)  acetaminophen (TYLENOL) tablet 1,000 mg (1,000 mg Oral Given 05/11/22 1729)  methylPREDNISolone sodium succinate (SOLU-MEDROL) 125 mg/2 mL injection 125 mg (125 mg Intravenous Given 05/11/22 1730)  ipratropium-albuterol (DUONEB) 0.5-2.5 (3) MG/3ML nebulizer solution 3 mL (3 mLs Nebulization Given 05/11/22 1706)  albuterol (PROVENTIL) (2.5 MG/3ML) 0.083% nebulizer solution 2.5 mg (2.5 mg Nebulization Given 05/11/22 1706)  magnesium sulfate IVPB 2 g 50 mL (0 g Intravenous Stopped 05/11/22 1900)    ED Course/ Medical Decision Making/ A&P  Medical  Decision Making Amount and/or Complexity of Data Reviewed Labs: ordered. Radiology: ordered.  Risk OTC drugs. Prescription drug management.   This patient presents to the ED for concern of SOB, this involves a number of treatment options, and is a complaint that carries with it a high risk of complications and morbidity. A differential diagnosis was considered for the patient's symptoms which is discussed below:   The causes for shortness of breath include but are not limited to Cardiac (AHF, pericardial effusion and tamponade, arrhythmias, ischemia, etc) Respiratory (COPD, asthma, pneumonia, pneumothorax, primary pulmonary hypertension, PE/VQ mismatch) Hematological (anemia) Neuromuscular (ALS, Guillain-Barr, etc)    Co morbidities: Discussed in HPI   Brief History:  Patient is a 71 year old female with past medical history significant for asthma COPD overlap syndrome, chronic respiratory failure with chronic 3 L home O2 patient states she only uses at night, anxiety, carotid artery stenosis on Plavix, chronic constipation, CAD, stroke, allergies  Patient states for the past 10 days she has been having more shortness of breath wheezing fatigue and coughing.  She denies any chest pain.  She states that she feels like her breathing is worse than it usually is.  She has been using her inhaler she states she has several.  She is followed by pulmonology Dr. Lake Bells.  She denies any leg swelling either unilateral or bilateral.  No nausea or vomiting.  Does state that she feels that she has had perhaps slightly more congestion than usual.  No recent surgeries, hospitalization, long travel, hemoptysis, estrogen containing OCP, cancer history.  No unilateral leg swelling.  No history of PE or VTE.      EMR reviewed including pt PMHx, past surgical history and past visits to ER.   See HPI for more details   Lab Tests:   I ordered and independently interpreted labs. Labs notable  for Elevated plt counts. Otherwise nml.   Imaging Studies:  NAD. I personally reviewed all imaging studies and no acute abnormality found. I agree with radiology interpretation.  IMPRESSION:  1. Stable emphysema.  No acute process.      Electronically Signed    By: Randa Ngo M.D.    On: 05/11/2022 16:49    Cardiac Monitoring:  The patient was maintained on a cardiac monitor.  I personally viewed and interpreted the cardiac monitored which showed an underlying rhythm of: NSR EKG non-ischemic   Medicines ordered:  I ordered medication including mag, solumedrol, tylenol, duoneb, MDI inhaler  Reevaluation of the patient after these medicines showed that the patient improved I have reviewed the patients home medicines and have made adjustments as needed   Critical Interventions:     Consults/Attending Physician   I discussed this case with my attending physician who cosigned this note including patient's presenting symptoms, physical exam, and planned diagnostics and interventions. Attending physician stated agreement with plan or made changes to plan which were implemented.   Reevaluation:  After the interventions noted above I re-evaluated patient and found that they have :improved   Social Determinants of Health:      Problem List / ED Course:  Patient is wheezing and with poor air movement she is a chronic COPD asthma patient.  We did share decision-making conversation about extensive workup including D-dimer versus treating her symptoms and she would prefer the latter.  I have a low suspicion for PE ultimately.  She has significant improvement with magnesium, Solu-Medrol, DuoNebs.  She is on her home oxygen and not desaturating while  walking and is actually on no oxygen at rest with no desaturation which seems to be approximately her baseline.  COVID influenza negative cardiac workup normal.  Will discharge home with follow-up with  pulmonology.   Dispostion:  After consideration of the diagnostic results and the patients response to treatment, I feel that the patent would benefit from outpatient follow-up.  Prednisone azithromycin given   Final Clinical Impression(s) / ED Diagnoses Final diagnoses:  COPD exacerbation ([place])    Rx / DC Orders ED Discharge Orders          Ordered    predniSONE (STERAPRED UNI-PAK 21 TAB) 10 MG (21) TBPK tablet  Daily,   Status:  Discontinued        05/11/22 1828    azithromycin (ZITHROMAX) 250 MG tablet  Daily,   Status:  Discontinued        05/11/22 1828    azithromycin (ZITHROMAX) 250 MG tablet  Daily        05/11/22 1847    predniSONE (STERAPRED UNI-PAK 21 TAB) 10 MG (21) TBPK tablet  Daily        05/11/22 1847              Tedd Sias, [place] 05/12/22 0001    Margette Fast, MD 05/17/22 1505

## 2022-05-11 NOTE — ED Provider Triage Note (Signed)
Emergency Medicine Provider Triage Evaluation Note  Anne Rich , a 71 y.o. female  was evaluated in triage.  Pt complains of SOB, cough fatigue for the past 10 days states she has a hx of ashtma/copd but does not wear oxygen. Uses her (3 different) inhalers as prescribed.   No CP  No recent surgeries, hospitalization, long travel, hemoptysis, estrogen containing OCP, cancer history.  No unilateral leg swelling.  No history of PE or VTE.   On plavix but no anticoagulants  Review of Systems  Positive: SOB, cough Negative: Fever   Physical Exam  BP 135/65 (BP Location: Left Arm)   Pulse (!) 102   Temp 98 F (36.7 C) (Oral)   Resp 20   SpO2 93%  Gen:   Awake, no distress   Resp:  Normal effort  MSK:   Moves extremities without difficulty  Other:  Faint wheeze end expiratory, speaking in full sentences. Somewhat difficult to auscultate air movement well.   Medical Decision Making  Medically screening exam initiated at 4:35 PM.  Appropriate orders placed.  Anne Rich was informed that the remainder of the evaluation will be completed by another provider, this initial triage assessment does not replace that evaluation, and the importance of remaining in the ED until their evaluation is complete.  Pt is being moved to room right now.    Anne Rich, [place] 05/11/22 [id number]

## 2022-05-11 NOTE — ED Triage Notes (Signed)
Reports shortness of breath 10 days , SPO2 85 in triage . Persistent cough . Hx asthma

## 2022-05-11 NOTE — ED Notes (Signed)
Pt requests labs be drawn with "[facility]fly" Informed pt that she possibly would need IV. Updated EDP wylder, PA. He will see pt and discuss options

## 2022-05-11 NOTE — ED Notes (Signed)
Patient transported to X-ray 

## 2022-05-14 ENCOUNTER — Telehealth: Payer: Self-pay

## 2022-05-14 NOTE — Telephone Encounter (Signed)
Patient called in again - DOB verified - stated the Special[facility] called her again stating she needed to make sure she called our office to have someone from our office call them approving of the 2 injections of Dupixent. Patient gave Specialty contact information - [phone].  Patient advised message would be forwarded to [facility] as well.   Patient verbalized understanding.

## 2022-05-14 NOTE — Telephone Encounter (Signed)
Patient called in - DOB verified - stated she received a call from a CSR from the [facility]ty pharmacy for Dupixent regarding a $200 discount coupon - patient stated she can not afford that. Patient was advised to contact our office to inform us.  Patient advised message would be forwarded to Tammy, our biologic coordinator to follow up with her. Patient was given Tammy's contact information as well.  Patient verbalized understanding, no further questions.

## 2022-05-15 ENCOUNTER — Ambulatory Visit (INDEPENDENT_AMBULATORY_CARE_PROVIDER_SITE_OTHER): Payer: Medicare Other | Admitting: Pulmonary Disease

## 2022-05-15 ENCOUNTER — Encounter: Payer: Self-pay | Admitting: Pulmonary Disease

## 2022-05-15 VITALS — BP 134/80 | HR 86 | Temp 98.0°F | Ht 68.0 in | Wt 147.4 lb

## 2022-05-15 DIAGNOSIS — I6523 Occlusion and stenosis of bilateral carotid arteries: Secondary | ICD-10-CM

## 2022-05-15 DIAGNOSIS — J4489 Other specified chronic obstructive pulmonary disease: Secondary | ICD-10-CM | POA: Diagnosis not present

## 2022-05-15 NOTE — Patient Instructions (Addendum)
I am glad you are better after your recent visit to the emergency room Continue prednisone taper as directed Continue the inhalers and injection therapy Follow-up in 6 months
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
## 2022-05-16 NOTE — Telephone Encounter (Signed)
Left message for patient to give me a call and to see how she was doing.

## 2022-05-16 NOTE — Telephone Encounter (Signed)
Patient went to the [facility] ER Saturday and they said she was having severe asthma attacks so she was given IV steroids per patient and oral steroid tablets a taper pak. 60 mg for 3 days, 50 mg for 2 days, 40 mg for 2 days, 30 mg for 2 days the back to 10 mg daily. Patient said she could get all her nebulizer solutions except the budesonide. She also wants to go back on the xolair in the [place]. The xolair doesn't cost her anything out of pocket and she did well on it except she said when she was having trouble with her husband.

## 2022-05-17 NOTE — Telephone Encounter (Signed)
I just spoke to patient and straightened out her issue with [facility] for Dupixent and she just needs to reach out to get scheduled for delivery

## 2022-05-17 NOTE — Telephone Encounter (Signed)
We never gave Dupixent a try, did we? Let's do that first and see how she does. I like that plan.   Malachi Bonds, MD [facility]

## 2022-05-17 NOTE — Telephone Encounter (Signed)
We will work on getting Xolair back on board. Routing to [employer].   Malachi Bonds, MD [facility]

## 2022-05-18 ENCOUNTER — Telehealth: Payer: Self-pay | Admitting: Allergy & Immunology

## 2022-05-18 ENCOUNTER — Telehealth: Payer: Self-pay

## 2022-05-18 ENCOUNTER — Other Ambulatory Visit (HOSPITAL_COMMUNITY): Payer: Self-pay

## 2022-05-18 DIAGNOSIS — J4489 Other specified chronic obstructive pulmonary disease: Secondary | ICD-10-CM

## 2022-05-18 MED ORDER — LEVALBUTEROL HCL 0.63 MG/3ML IN NEBU: 0.6300 mg | INHALATION_SOLUTION | RESPIRATORY_TRACT | 1 refills | Status: DC | PRN

## 2022-05-18 MED ORDER — BUDESONIDE 0.5 MG/2ML IN SUSP: RESPIRATORY_TRACT | 3 refills | Status: DC

## 2022-05-18 NOTE — Telephone Encounter (Signed)
PA request received via CMM from [facility] for Budesonide 0.5MG /2ML suspension  PA not submitted due to medication being covered under Medicare Part B per benefits investigation.  Please submit prescription with appropriate DX code to patients pharmacy to process under Medicare B.  Key: [id number]

## 2022-05-18 NOTE — Telephone Encounter (Signed)
Spoke with [facility] and the levalbuterol has been mailed tracking # [id number] and the budesonide is still pending- pt informed.

## 2022-05-18 NOTE — Telephone Encounter (Signed)
Spoke with pt- refilled Levalbuterol and Budesonide to [facility] for 90 day supply. We also scheduled her first Dupixent injection in [place] for next week- she is going to go pick it up at her pharmacy because they told her it was ready.

## 2022-05-18 NOTE — Telephone Encounter (Signed)
Patient would like a call back in regards to her budesonide. Patient states she found it at [facility].   Best contact number: [phone]

## 2022-05-21 ENCOUNTER — Ambulatory Visit: Payer: Medicare Other

## 2022-05-22 ENCOUNTER — Ambulatory Visit: Payer: Medicare Other | Admitting: Allergy & Immunology

## 2022-05-23 ENCOUNTER — Ambulatory Visit: Payer: Medicare Other

## 2022-05-23 IMAGING — DX DG CHEST 2V
2 series · 2 of 2 positions shown · non-contrast
Comparison: June 20, 2019.

CLINICAL DATA: Dyspnea on exertion, cough, history of COVID
pneumonia.

EXAM:
CHEST - 2 VIEW

[chest pa]
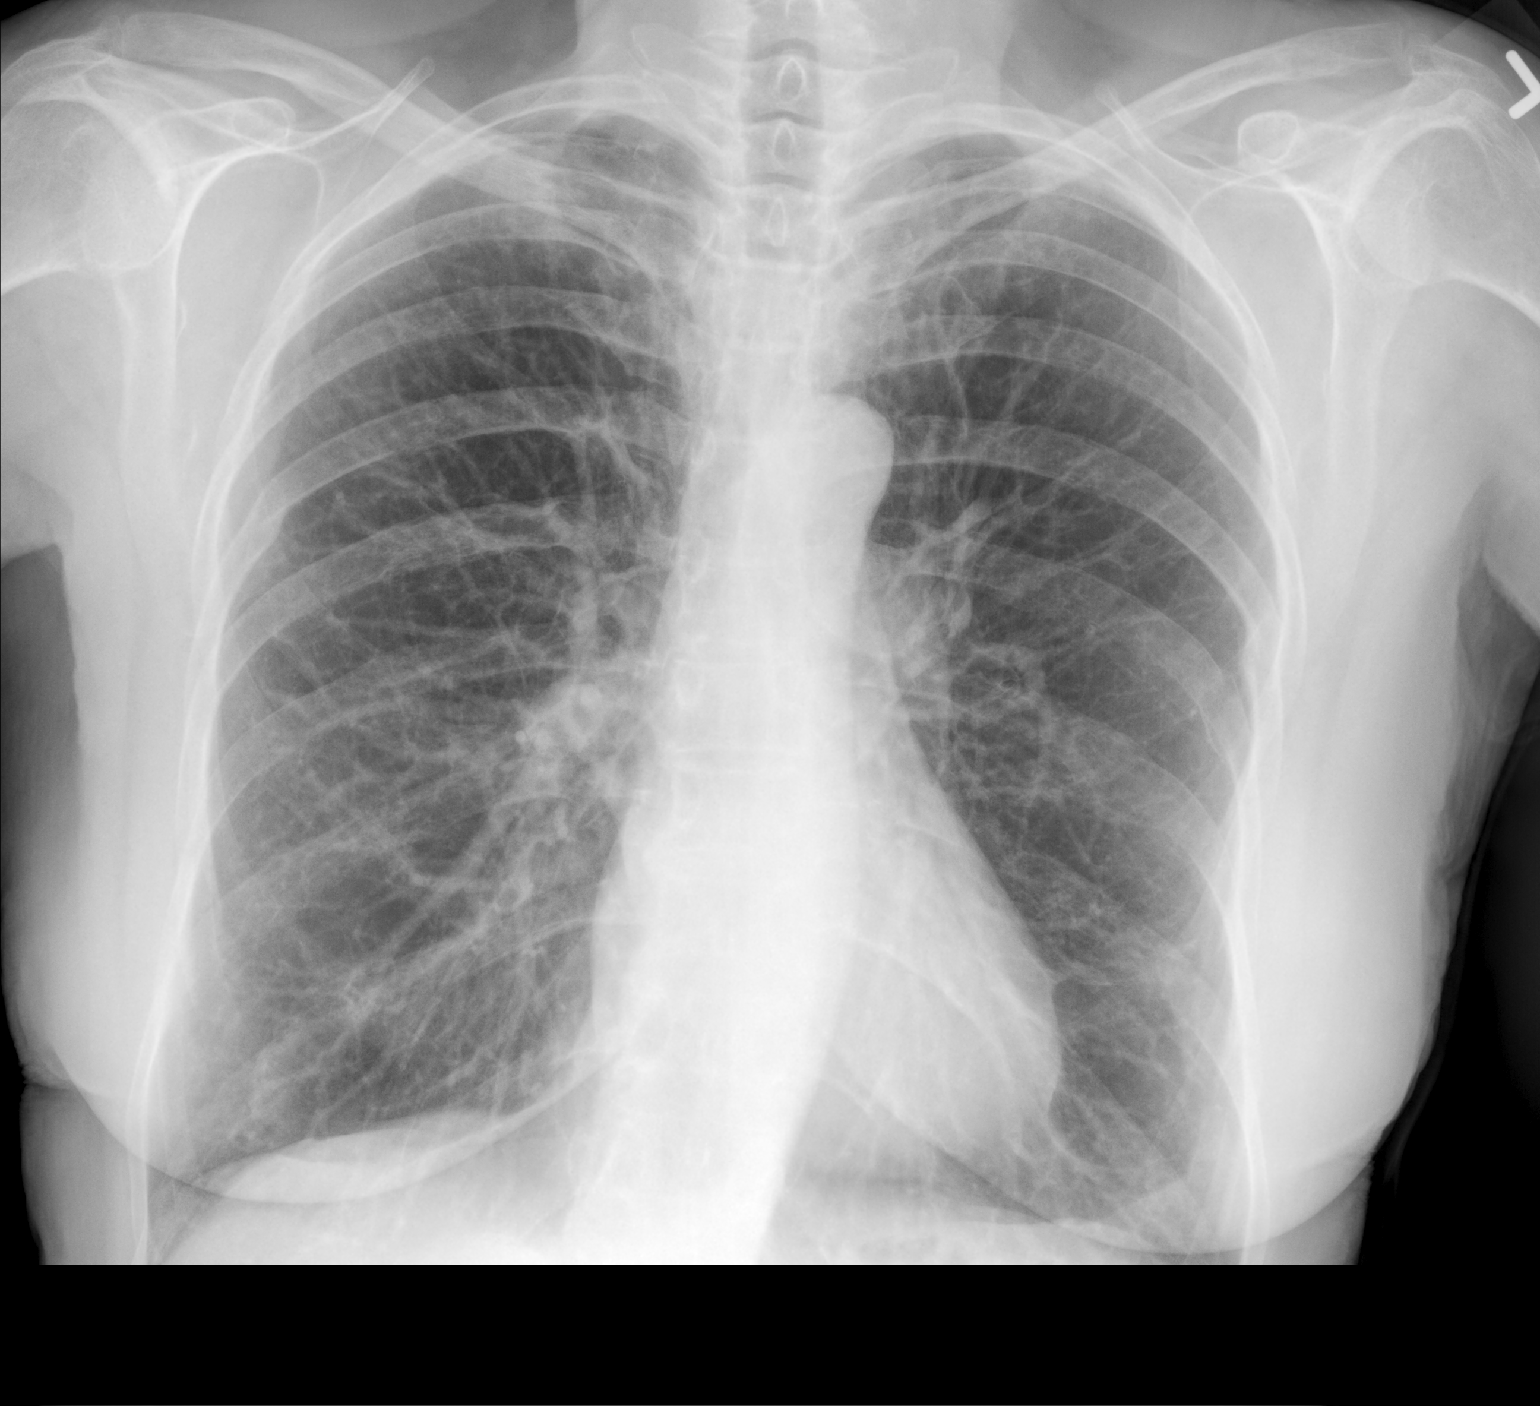

[chest lat]
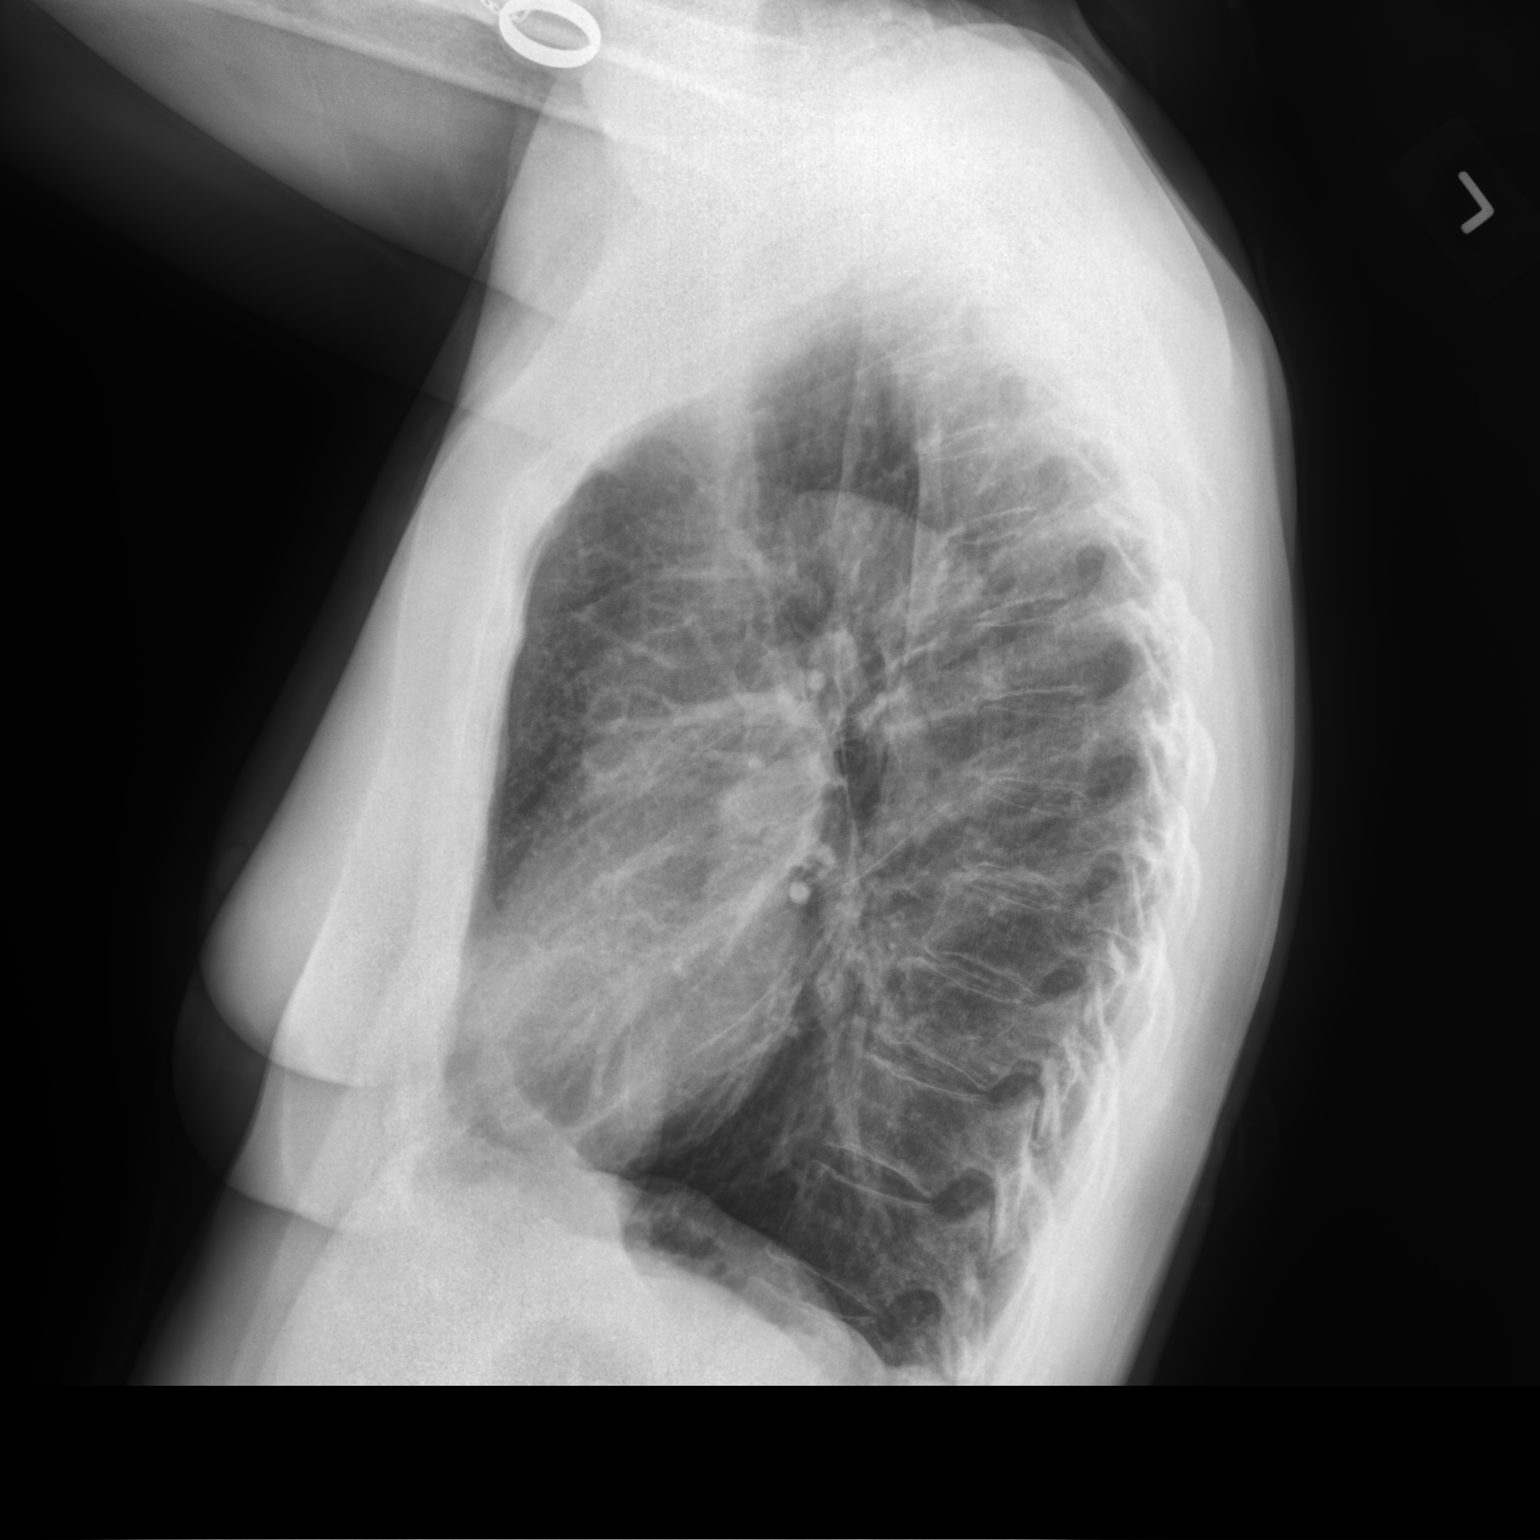

[2 of 2 positions shown; findings below may reference images not displayed]

FINDINGS: The heart size and mediastinal contours are within normal limits.
Both lungs are clear. No pneumothorax or pleural effusion is noted.
Old bilateral rib fractures are noted.
IMPRESSION: No active cardiopulmonary disease.

## 2022-06-01 ENCOUNTER — Ambulatory Visit: Payer: Medicare Other

## 2022-06-06 ENCOUNTER — Ambulatory Visit (INDEPENDENT_AMBULATORY_CARE_PROVIDER_SITE_OTHER): Payer: Medicare Other

## 2022-06-06 DIAGNOSIS — J4489 Other specified chronic obstructive pulmonary disease: Secondary | ICD-10-CM

## 2022-06-06 DIAGNOSIS — J4550 Severe persistent asthma, uncomplicated: Secondary | ICD-10-CM | POA: Diagnosis not present

## 2022-06-06 MED ORDER — DUPILUMAB 300 MG/2ML ~~LOC~~ SOSY
600.0000 mg | PREFILLED_SYRINGE | SUBCUTANEOUS | Status: DC
  Administered 2022-06-06: 600 mg via SUBCUTANEOUS

## 2022-06-06 NOTE — Progress Notes (Signed)
Immunotherapy   Patient Details  Name: Summar Mcglothlin MRN: [id number] Date of Birth: 06-04-1951  06/06/2022  [name] start Dupixent, patient waited in the office for 30 minutes without any reactions. Following schedule: for Dupixent Frequency:every 14 days Epi-Pen:Epi-Pen Available   Consent signed and patient instructions given.   Florence Canner 06/06/2022, 1:18 PM

## 2022-06-08 NOTE — Addendum Note (Signed)
Addended by: Devoria Glassing on: 06/08/2022 09:41 AM   Modules accepted: Orders

## 2022-06-14 ENCOUNTER — Telehealth: Payer: Self-pay | Admitting: Pulmonary Disease

## 2022-06-14 MED ORDER — YUPELRI 175 MCG/3ML IN SOLN: 175.0000 ug | Freq: Every day | RESPIRATORY_TRACT | 11 refills | Status: DC

## 2022-06-14 MED ORDER — ARFORMOTEROL TARTRATE 15 MCG/2ML IN NEBU: 15.0000 ug | INHALATION_SOLUTION | Freq: Two times a day (BID) | RESPIRATORY_TRACT | 11 refills | Status: DC

## 2022-06-14 NOTE — Telephone Encounter (Signed)
Pt calling for a 1 year refill of the following: Yupelri & Afrormoterolta....  Pharm is [facility] [phone]  TY

## 2022-06-14 NOTE — Telephone Encounter (Signed)
Called patient to confirm what medications she was needing refilled and she verified that it was [place] and that she wanted a years worth of refills sent into the [facility] she has on file. Nothing further needed

## 2022-06-20 ENCOUNTER — Ambulatory Visit: Payer: Medicare Other

## 2022-06-22 ENCOUNTER — Ambulatory Visit: Payer: Medicare Other

## 2022-06-26 ENCOUNTER — Telehealth: Payer: Self-pay

## 2022-06-26 NOTE — Telephone Encounter (Signed)
[facility] Paper work has been signed by Dr. Ernst Bowler on 06/26/2022 for ipratropium neb solution.  Forms are being faxed back to [facility] [phone].

## 2022-07-11 IMAGING — CR DG CHEST 2V
2 series · 2 of 2 positions shown · non-contrast
Comparison: Chest x-ray 04/15/2020, CT chest 06/20/2019.

CLINICAL DATA: Shortness of breath, COPD

EXAM:
CHEST - 2 VIEW.  Patient is rotated.

[w chest pa]
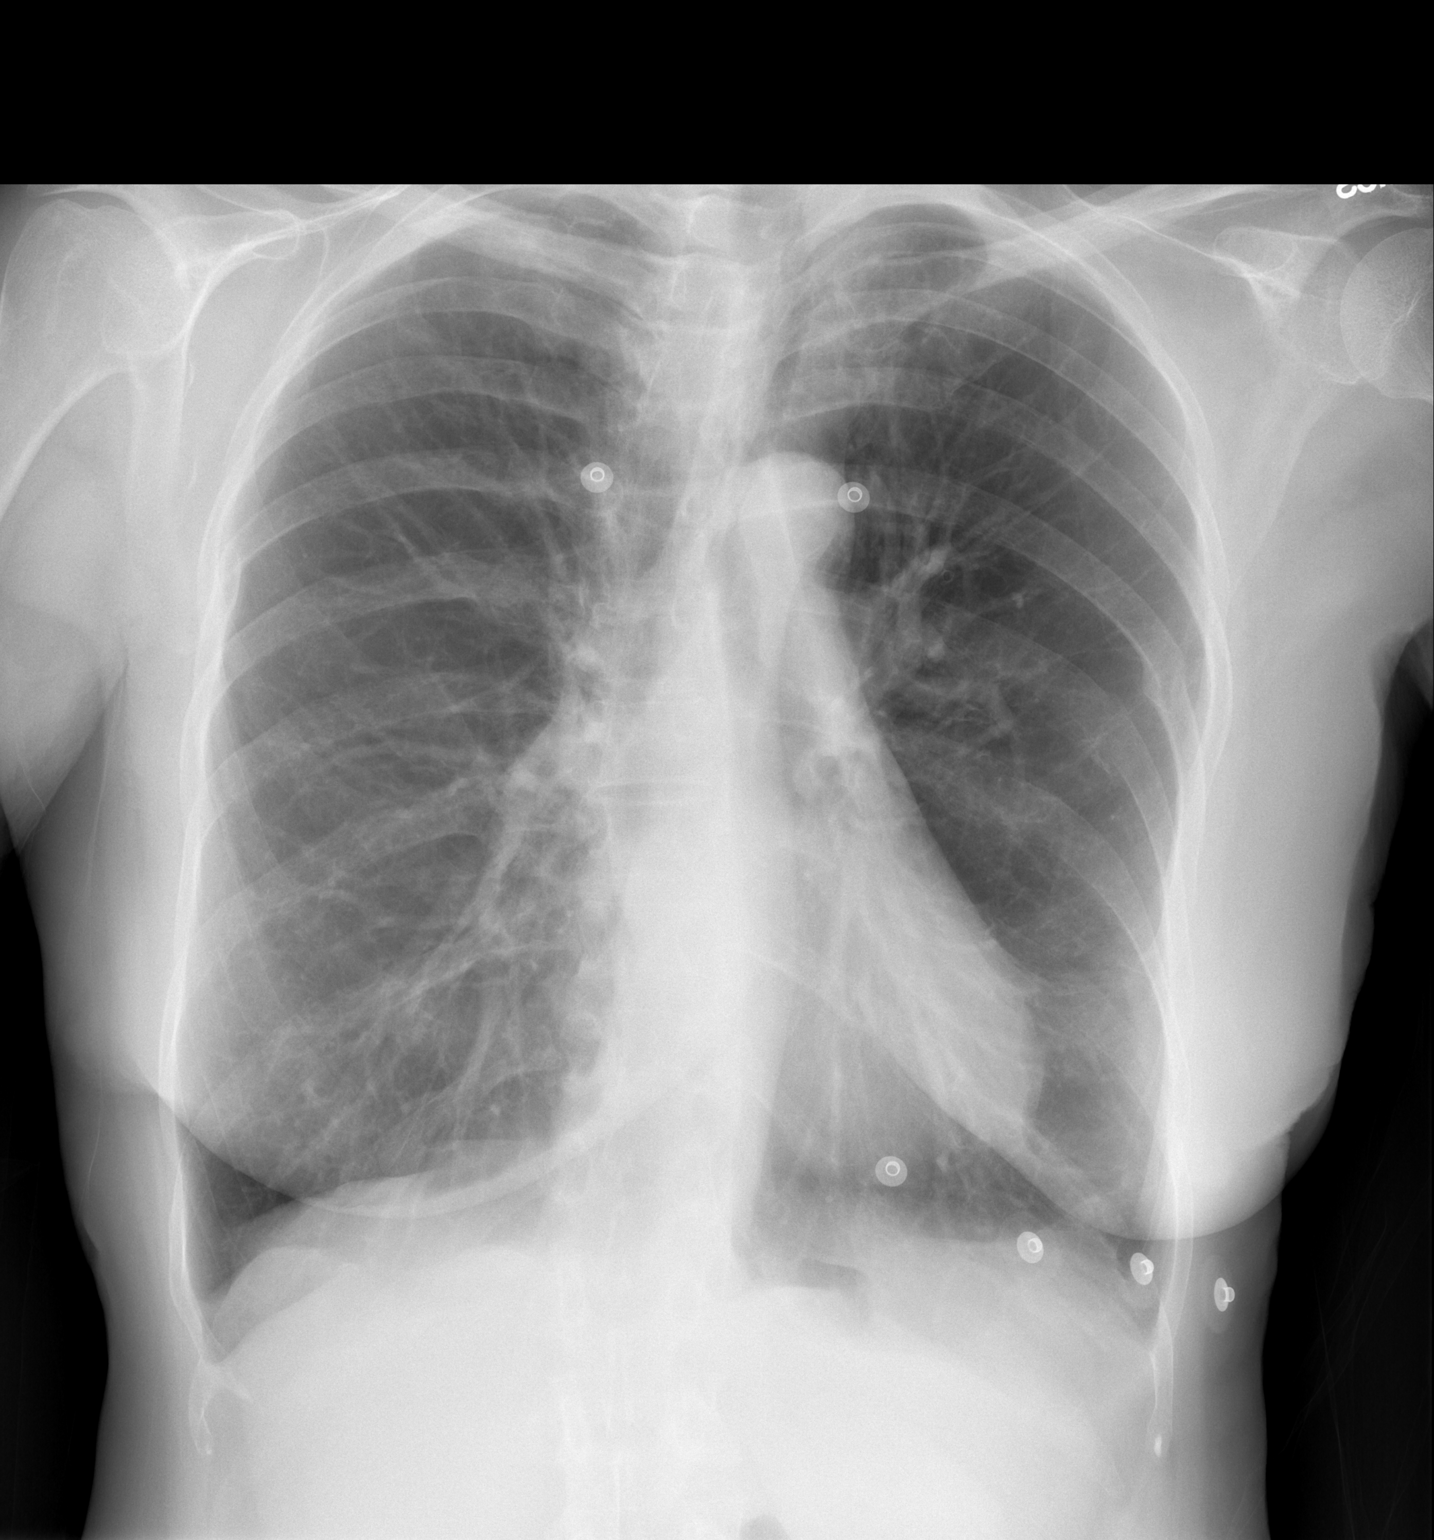

[w chest lat]
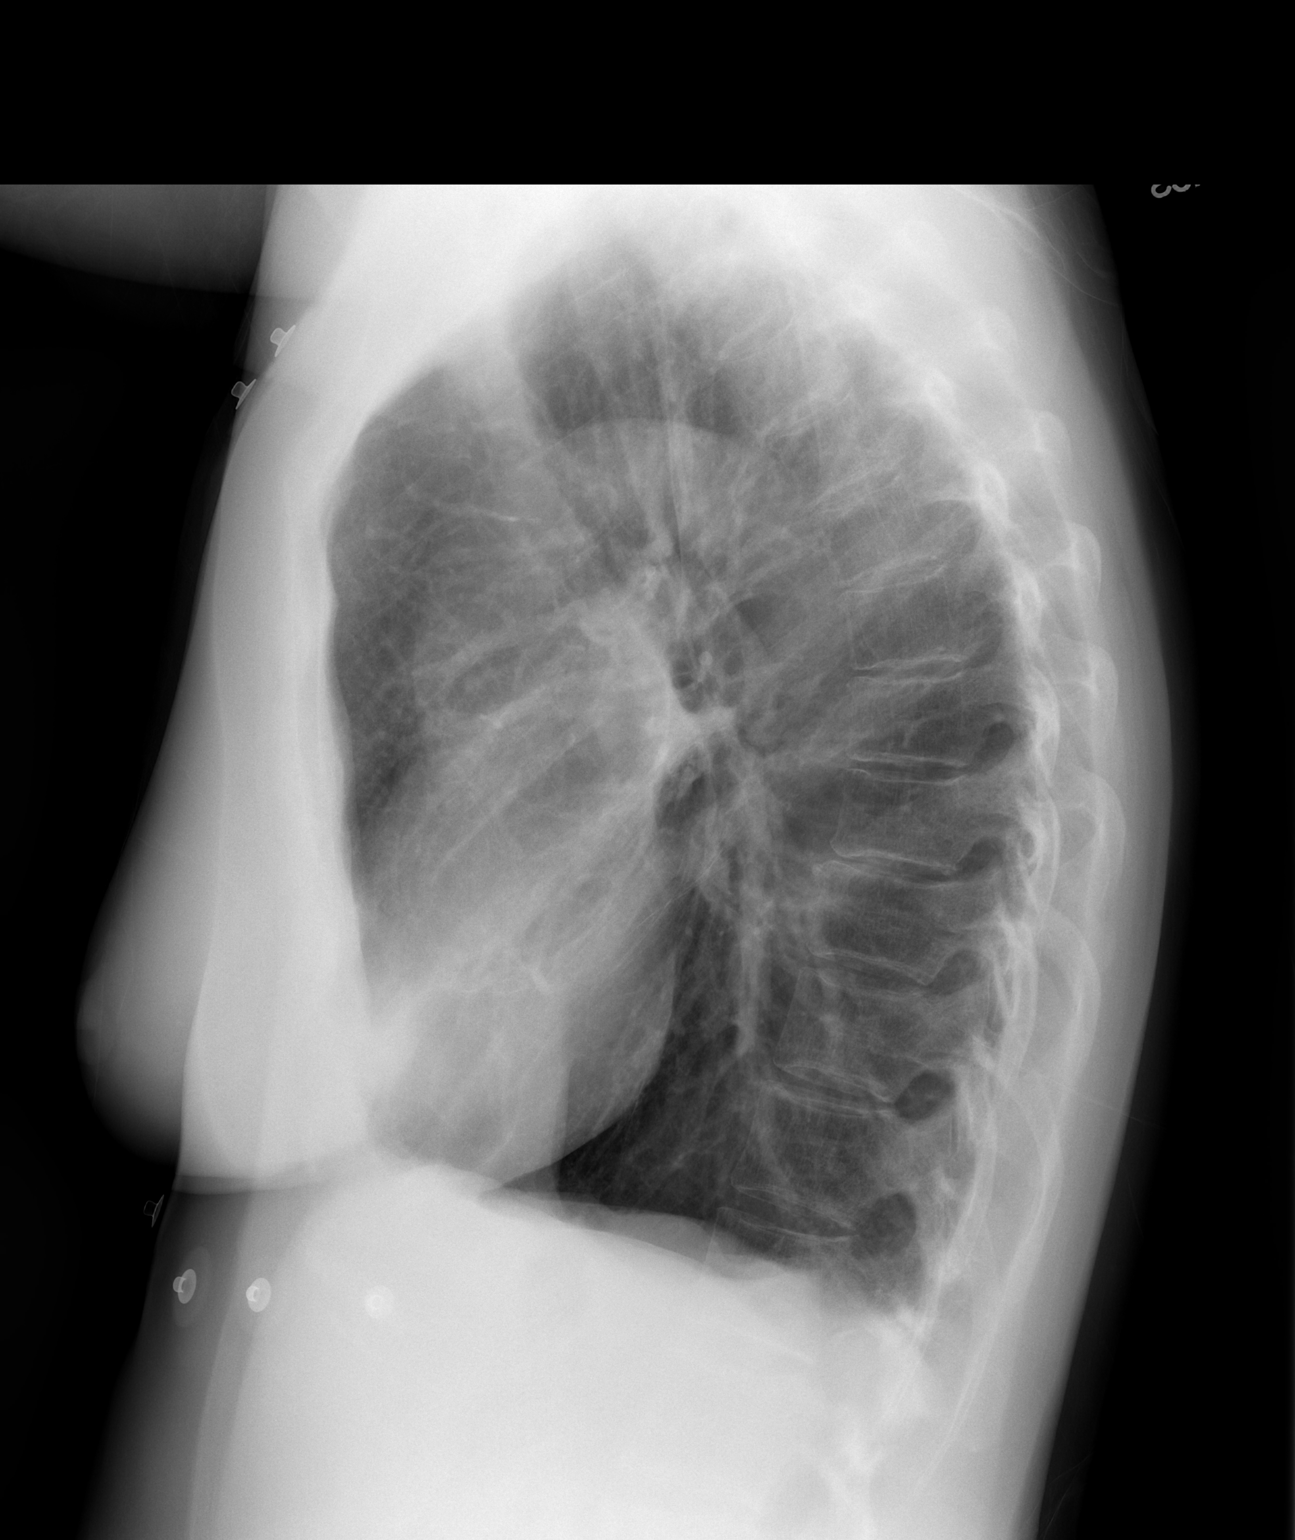

[2 of 2 positions shown; findings below may reference images not displayed]

FINDINGS: The heart size and mediastinal contours are within normal limits.

Hyperinflation of the lungs with flattening of the hemidiaphragms
consistent with emphysematous changes. No focal consolidation. No
pulmonary edema. No pleural effusion. No pneumothorax. 1.3 cm round
density overlying the right lower lobe likely represents a nipple
shadow. Left nipple shadow overlies a rib and the left chest wall.

No acute osseous abnormality.  Old healed rib fractures.
IMPRESSION: No active cardiopulmonary disease in a patient with emphysematous
changes.

## 2022-07-17 ENCOUNTER — Encounter: Payer: Self-pay | Admitting: Allergy & Immunology

## 2022-07-17 ENCOUNTER — Ambulatory Visit (INDEPENDENT_AMBULATORY_CARE_PROVIDER_SITE_OTHER): Payer: Medicare Other | Admitting: Allergy & Immunology

## 2022-07-17 ENCOUNTER — Other Ambulatory Visit: Payer: Self-pay

## 2022-07-17 VITALS — BP 122/58 | HR 92 | Temp 97.8°F | Resp 16

## 2022-07-17 DIAGNOSIS — I6523 Occlusion and stenosis of bilateral carotid arteries: Secondary | ICD-10-CM

## 2022-07-17 DIAGNOSIS — K219 Gastro-esophageal reflux disease without esophagitis: Secondary | ICD-10-CM

## 2022-07-17 DIAGNOSIS — J4550 Severe persistent asthma, uncomplicated: Secondary | ICD-10-CM

## 2022-07-17 DIAGNOSIS — J4489 Other specified chronic obstructive pulmonary disease: Secondary | ICD-10-CM

## 2022-07-17 DIAGNOSIS — Z9981 Dependence on supplemental oxygen: Secondary | ICD-10-CM | POA: Diagnosis not present

## 2022-07-17 DIAGNOSIS — J3089 Other allergic rhinitis: Secondary | ICD-10-CM

## 2022-07-17 DIAGNOSIS — Z7952 Long term (current) use of systemic steroids: Secondary | ICD-10-CM

## 2022-07-17 MED ORDER — OMALIZUMAB 150 MG/ML ~~LOC~~ SOSY
150.0000 mg | PREFILLED_SYRINGE | SUBCUTANEOUS | Status: DC
  Administered 2022-07-17 – 2023-02-19 (×16): 150 mg via SUBCUTANEOUS
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
## 2022-07-17 NOTE — Progress Notes (Signed)
Immunotherapy   Patient Details  Name: Anne Rich MRN: [id number] Date of Birth: 03-Jun-1951  07/17/2022  [name] started injections for  Xolair 150mg   Following schedule: Every fourteen days.  Frequency: Every two weeks.  Epi-Pen:Epi-Pen Available  Consent signed in office today and patient instructions given.   Julius Bowels 07/17/2022, 6:08 PM

## 2022-07-17 NOTE — Patient Instructions (Addendum)
Moderate persistent asthma with COPD overlap:  - Lung testing not done today.  - Continue with oxygen as needed.  - Start burst of prednisone: 30mg  twice daily for 4 days, 20mg  twice daily for 4 days, 10mg  twice daily for 4 days, then go back to 10mg  daily thereafter - Restart the Xolair today (150mg  every 2 weeks).  - Daily controller medication(s):       AM: Pulmicort (budesonide 0.5mg ) + Brovana (aformoterol) + Yupelri + Dalisrep 574mcg + prednisone 10mg       PM: Pulmicort (budesonide 0.5mg ) + Brovana (aformoterol)       EVERY TWO WEEKS: Xolair 150mg  - Rescue medications: Xopenex (levalbuterol) + Atrovent (ipratropium) mixed together every 4-6 hours as needed via nebulizer - Asthma control goals:  * Full participation in all desired activities (may need albuterol before activity) * Albuterol use two time or less a week on average (not counting use with activity) * Cough interfering with sleep two time or less a month * Oral steroids no more than once a year * No hospitalizations *Minimize medical procedures   2. Allergic rhinitis - Continue with montelukast 10mg  daily.  - Continue with levocetirizine 5mg  daily   3. Return in about 3 months (around 10/15/2022).    Please inform us of any Emergency Department visits, hospitalizations, or changes in symptoms. Call us before going to the ED for breathing or allergy symptoms since we might be able to fit you in for a sick visit. Feel free to contact us anytime with any questions, problems, or concerns.  It was a pleasure to see you again today! STAY AWAY FROM THE MEN!   Websites that have reliable patient information: 1. American Academy of Asthma, Allergy, and Immunology: www.aaaai.org 2. Food Allergy Research and Education (FARE): foodallergy.org 3. Mothers of Asthmatics: http://www.asthmacommunitynetwork.org 4. American College of Allergy, Asthma, and Immunology: www.acaai.org   COVID-19 Vaccine Information can be found at:  [email or web address].[email or web address].uk For questions related to vaccine distribution or appointments, please email [email or web address] or call [phone].   We realize that you might be concerned about having an allergic reaction to the COVID19 vaccines. To help with that concern, WE ARE OFFERING THE COVID19 VACCINES IN OUR OFFICE! Ask the front desk for dates!     "Like" [place] on Facebook and Instagram for our latest updates!      A healthy democracy works best when [employer] participate! Make sure you are registered to vote! If you have moved or changed any of your contact information, you will need to get this updated before voting!  In some cases, you MAY be able to register to vote online: [email or web address]

## 2022-07-18 ENCOUNTER — Other Ambulatory Visit: Payer: Self-pay | Admitting: Vascular Surgery

## 2022-07-18 DIAGNOSIS — I6523 Occlusion and stenosis of bilateral carotid arteries: Secondary | ICD-10-CM

## 2022-07-19 ENCOUNTER — Encounter: Payer: Self-pay | Admitting: Allergy & Immunology

## 2022-07-20 ENCOUNTER — Ambulatory Visit (HOSPITAL_COMMUNITY)
Admission: RE | Admit: 2022-07-20 | Discharge: 2022-07-20 | Disposition: A | Discharge status: Home / Self Care | Payer: Medicare Other | Source: Ambulatory Visit | Attending: Vascular Surgery | Admitting: Vascular Surgery

## 2022-07-20 ENCOUNTER — Encounter: Payer: Self-pay | Admitting: Physician Assistant

## 2022-07-20 ENCOUNTER — Ambulatory Visit (INDEPENDENT_AMBULATORY_CARE_PROVIDER_SITE_OTHER): Payer: Medicare Other | Admitting: Physician Assistant

## 2022-07-20 ENCOUNTER — Telehealth: Payer: Self-pay | Admitting: Allergy & Immunology

## 2022-07-20 VITALS — BP 134/70 | HR 87 | Temp 97.4°F | Resp 20 | Ht 68.0 in | Wt 148.4 lb

## 2022-07-20 DIAGNOSIS — I6523 Occlusion and stenosis of bilateral carotid arteries: Secondary | ICD-10-CM

## 2022-07-20 MED ORDER — BENZONATATE 100 MG PO CAPS: 100.0000 mg | ORAL_CAPSULE | Freq: Three times a day (TID) | ORAL | 0 refills | Status: DC | PRN

## 2022-07-20 MED ORDER — AZITHROMYCIN 250 MG PO TABS: ORAL_TABLET | ORAL | 0 refills | Status: DC

## 2022-07-20 NOTE — Telephone Encounter (Signed)
Message sent to the provider.

## 2022-07-20 NOTE — Telephone Encounter (Signed)
I sent in [facility] and azithromycin course.  I gave her enough pills for her azithromycin to repeat if she was still having issues a few days after her first course stops.

## 2022-07-20 NOTE — Telephone Encounter (Signed)
Patient states she is not getting better and is requesting an antibiotic to be sent in for her. Patient states she is also coughing a lot and is requesting cough medicine to be sent in as well.   [facility] [place] [id number]  Best contact number: [phone]

## 2022-07-20 NOTE — Telephone Encounter (Signed)
Called and left a voicemail asking for patient to return call to inform.  

## 2022-07-20 NOTE — Progress Notes (Signed)
History of Present Illness:  Patient is a 72 y.o. year old female who presents for evaluation of carotid stenosis.  She states she has history of stroke back in 1997.  No lasting symptoms.  The patient denies symptoms of TIA, amaurosis, or stroke since this event.    On her last visit 6 months ago she has right ICA stenosis consistent with 60-79% and PSV of 90.    She has an allergy to Statins.  She is not currently on ASA or Plavix.      Past Medical History:  Diagnosis Date   Allergic rhinitis    Asthma    COPD (chronic obstructive pulmonary disease) (HCC)     Past Surgical History:  Procedure Laterality Date   ABDOMINAL HYSTERECTOMY     APPENDECTOMY     CESAREAN SECTION       Social History Social History   Tobacco Use   Smoking status: Former    Packs/day: 0.50    Years: 15.00    Total pack years: 7.50    Types: Cigarettes    Quit date: 06/11/1994    Years since quitting: 28.1    Passive exposure: Never   Smokeless tobacco: Never  Vaping Use   Vaping Use: Never used  Substance Use Topics   Alcohol use: No   Drug use: No    Family History Family History  Problem Relation Age of Onset   Allergic rhinitis Neg Hx    Angioedema Neg Hx    Asthma Neg Hx    Eczema Neg Hx    Immunodeficiency Neg Hx    Urticaria Neg Hx     Allergies  Allergies  Allergen Reactions   Iodine Itching, Rash and Swelling    IV contrast    Iodine-131 Itching and Swelling    Pt states causes severe itching Pt states causes severe itching    Penicillins Hives and Swelling    Swelling of the throat   Pineapple Other (See Comments) and Swelling    Pt states causes her throat swelling Pt states causes her throat swelling    Shellfish Allergy Rash and Swelling    Had reaction to cardiac cath dye  Had seizure ,rash ,itch Oct. 2012 Had reaction to cardiac cath dye  Had seizure ,rash ,itch Oct. 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 54 g 3   arformoterol (BROVANA) 15 MCG/2ML NEBU Take 2 mLs (15 mcg total) by nebulization 2 (two) times daily. 120 mL 11   BREZTRI AEROSPHERE 160-9-4.8 MCG/ACT AERO Inhale 2 puffs into the lungs in the morning and at bedtime. 32.1 g 3   budesonide (PULMICORT) 0.5 MG/2ML nebulizer solution USE 1 VIAL  IN  NEBULIZER TWICE  DAILY - rinse mouth after treatment 360 mL 3   carbamazepine (TEGRETOL) 200 MG tablet Take 200-400 mg by mouth 2 (two) times daily. 211m tab in the morning and 406mat night     cyclobenzaprine (FLEXERIL) 5 MG  tablet Take 5 mg by mouth daily.     diclofenac sodium (VOLTAREN) 1 % GEL Apply 2 g topically 4 (four) times daily as needed (knee pain).      EPINEPHrine (EPIPEN 2-PAK) 0.3 mg/0.3 mL IJ SOAJ injection Inject 0.3 mg into the muscle Once PRN. 1 each 2   estradiol (ESTRACE) 1 MG tablet Take 1 mg by mouth daily.  3   gabapentin (NEURONTIN) 600 MG tablet Take 600-1,200 mg by mouth 2 (two) times daily. 648m in the morning and 12081mat bedtime     ipratropium (ATROVENT) 0.02 % nebulizer solution Take 2.5 mLs (0.5 mg total) by nebulization every 4 (four) hours as needed for wheezing or shortness of breath. 75 mL 2   levalbuterol (XOPENEX) 0.63 MG/3ML nebulizer solution Take 3 mLs (0.63 mg total) by nebulization every 4 (four) hours as needed for wheezing or shortness of breath. 540 mL 1   levETIRAcetam (KEPPRA) 1000 MG tablet Take 1,000-2,000 mg by mouth 2 (two) times daily. 100029mn the morning and 2000m34m night     levocetirizine (XYZAL) 5 MG tablet TAKE 1 TABLET  (5 MG TOTAL) BY MOUTH DAILY AS NEEDED FOR ALLERGIES (FOR RUNNY NOSE). 90 tablet 0   lisinopril (PRINIVIL,ZESTRIL) 10 MG tablet Take 20 mg by mouth daily.     LORazepam (ATIVAN) 1 MG tablet Take 1 mg by mouth 3 (three) times daily.     meclizine (ANTIVERT) 25 MG tablet Take 25 mg by mouth 2 (two) times daily as needed for dizziness or nausea.      Melatonin 10 MG TABS Take 10 mg by mouth daily.     mirtazapine (REMERON) 30 MG tablet Take 30 mg by mouth every evening.      montelukast (SINGULAIR) 10 MG tablet TAKE 1 TABLET BY MOUTH EVERYDAY AT BEDTIME 90 tablet 1   Multiple Vitamin (MULTIVITAMIN) tablet Take 1 tablet by mouth daily.      omeprazole (PRILOSEC) 40 MG capsule Take 40 mg by mouth 2 (two) times a day.     ondansetron (ZOFRAN) 4 MG tablet TAKE 1 TABLET (4 MG TOTAL) BY MOUTH 2 (TWO) TIMES DAILY AS NEEDED FOR NAUSEA. 20 tablet 1   oxybutynin (DITROPAN) 5 MG tablet Take by mouth.     OXYGEN Inhale 3 L into the lungs at bedtime. 4 L when walking     predniSONE (DELTASONE) 10 MG tablet Take 10 mg by mouth daily with breakfast.     predniSONE (STERAPRED UNI-PAK 21 TAB) 10 MG (21) TBPK tablet Take by mouth daily. Take 6 tabs by mouth daily  for 2 days, then 5 tabs for 2 days, then 4 tabs for 2 days, then 3 tabs for 2 days, 2 tabs for 2 days, then 1 tab by mouth daily for 2 days 42 tablet 0   QUEtiapine (SEROQUEL) 25 MG tablet Take 25-50 mg by mouth at bedtime.     revefenacin (YUPELRI) 175 MCG/3ML nebulizer solution Inhale 3 mLs (175 mcg total) into the lungs daily. 90 mL 11   roflumilast (DALIRESP) 500 MCG TABS tablet Take 1 tablet (500 mcg total) by mouth daily. 90 tablet 3   Current Facility-Administered Medications  Medication Dose Route Frequency Provider Last Rate Last Admin   dupilumab (DUPIXENT) prefilled syringe 600 mg  600 mg Subcutaneous Q14 Days GallValentina Shaggy   600 mg at 06/06/22 0939   omalizumab (XOLArvid Rightefilled syringe 150 mg  150 mg Subcutaneous Q14 Days  GallValentina Shaggy  150 mg at 07/17/22 1809    ROS:   General:  No weight loss, Fever, chills  HEENT: No recent headaches, no nasal bleeding, no visual changes, no sore throat  Neurologic: No dizziness, blackouts, seizures. No recent symptoms of stroke or mini- stroke. No recent episodes of slurred speech, or temporary blindness.  Cardiac: No recent episodes of chest pain/pressure, no shortness of breath at rest.  No shortness of breath with exertion.  Denies history of atrial fibrillation or irregular heartbeat  Vascular: No history of rest pain in feet.  No history of claudication.  No history of non-healing ulcer, No history of DVT   Pulmonary: No home oxygen, no productive cough, no hemoptysis,  No asthma or wheezing  Musculoskeletal:  [ ]$  Arthritis, [ ]$  Low back pain,  [x ] Joint pain  Hematologic:No history of hypercoagulable state.  No history of easy bleeding.  No history of anemia  Gastrointestinal: No hematochezia or melena,  No gastroesophageal reflux, no trouble swallowing  Urinary: [ ]$  chronic Kidney disease, [ ]$  on HD - [ ]$  MWF or [ ]$  TTHS, [ ]$  Burning with urination, [ ]$  Frequent urination, [ ]$  Difficulty urinating;   Skin: No rashes  Psychological: No history of anxiety,  No history of depression   Physical Examination  Vitals:   07/20/22 0922 07/20/22 0923  BP: 138/77 134/70  Pulse: 87   Resp: 20   Temp: (!) 97.4 F (36.3 C)   TempSrc: Temporal   SpO2: 98%   Weight: 148 lb 6.4 oz (67.3 kg)   Height: 5' 8"$  (1.727 m)     Body mass index is 22.56 kg/m.  General:  Alert and oriented, no acute distress HEENT: Normal Neck: No bruit or JVD Pulmonary: Clear to auscultation bilaterally Cardiac: Regular Rate and Rhythm without murmur Gastrointestinal: Soft, non-tender, non-distended, no mass, no scars Skin: No rash Extremity Pulses:  2+ radial pulses bilaterally Musculoskeletal: No deformity or edema  Neurologic: Upper and lower extremity motor  5/5 and symmetric  DATA:   Right Carotid Findings:  +----------+--------+--------+--------+------------------+--------+           PSV cm/sEDV cm/sStenosisPlaque DescriptionComments  +----------+--------+--------+--------+------------------+--------+  CCA Prox  102     16                                          +----------+--------+--------+--------+------------------+--------+  CCA Mid   86      17                                          +----------+--------+--------+--------+------------------+--------+  CCA Distal73      18              heterogenous                +----------+--------+--------+--------+------------------+--------+  ICA Prox  142     29              heterogenous                +----------+--------+--------+--------+------------------+--------+  ICA Mid   130     35                                          +----------+--------+--------+--------+------------------+--------+  ICA Distal114     36                                          +----------+--------+--------+--------+------------------+--------+  ECA      147     11                                          +----------+--------+--------+--------+------------------+--------+   +----------+--------+-------+----------------+-------------------+           PSV cm/sEDV cmsDescribe        Arm Pressure (mmHG)  +----------+--------+-------+----------------+-------------------+  [id number]    9      Multiphasic, [id number]                  +----------+--------+-------+----------------+-------------------+   +---------+--------+--+--------+-+---------+  VertebralPSV cm/s39EDV cm/s7Antegrade  +---------+--------+--+--------+-+---------+      Left Carotid Findings:  +----------+--------+--------+--------+------------------+--------+           PSV cm/sEDV cm/sStenosisPlaque DescriptionComments   +----------+--------+--------+--------+------------------+--------+  CCA Prox  92      17                                          +----------+--------+--------+--------+------------------+--------+  CCA Mid   89      15                                          +----------+--------+--------+--------+------------------+--------+  CCA Distal74      12                                          +----------+--------+--------+--------+------------------+--------+  ICA Prox  315     91      60-79%  heterogenous                +----------+--------+--------+--------+------------------+--------+  ICA Mid   262     54      40-59%                              +----------+--------+--------+--------+------------------+--------+  ICA Distal93      30                                          +----------+--------+--------+--------+------------------+--------+  ECA      106     13                                          +----------+--------+--------+--------+------------------+--------+   +----------+--------+--------+----------------+-------------------+           PSV cm/sEDV cm/sDescribe        Arm Pressure (mmHG)  +----------+--------+--------+----------------+-------------------+  [id number]    3       Multiphasic, [id number]                  +----------+--------+--------+----------------+-------------------+   +---------+--------+--+--------+--+---------+  VertebralPSV cm/s94EDV cm/s25Antegrade  +---------+--------+--+--------+--+---------+       Summary:  Right Carotid: Velocities in the right ICA are consistent with a 1-39%  stenosis.   Left Carotid: Velocities in the left ICA are consistent with a 60-79%  stenosis.   Vertebrals: Bilateral vertebral arteries demonstrate antegrade flow.  Subclavians: Normal flow hemodynamics were seen in bilateral subclavian               arteries.   ASSESSMENT/PLAN:  Carotid stenosis   Duplex shows right ICA was < 39% stenosis and the left is unchanged with stenosis <79%.   She remains asymptomatic for stroke symptoms since her CVA in 2019.   I will have her return for repeat duplex in 6 months.   Unless she has symptoms or her ICA stenosis is > than 80% we will continue to repeat surveillance at close intervals.        Roxy Horseman PA-C [facility] Office: [phone]  MD in clinic [place]

## 2022-07-24 NOTE — Telephone Encounter (Signed)
Called and left a voicemail asking for return call to inform.

## 2022-07-25 ENCOUNTER — Other Ambulatory Visit: Payer: Self-pay | Admitting: *Deleted

## 2022-07-25 ENCOUNTER — Other Ambulatory Visit: Payer: Self-pay

## 2022-07-25 DIAGNOSIS — I6523 Occlusion and stenosis of bilateral carotid arteries: Secondary | ICD-10-CM

## 2022-07-25 MED ORDER — TESSALON PERLES 100 MG PO CAPS: 100.0000 mg | ORAL_CAPSULE | Freq: Three times a day (TID) | ORAL | 0 refills | Status: DC | PRN

## 2022-07-25 MED ORDER — ZITHROMAX 250 MG PO TABS: ORAL_TABLET | ORAL | 0 refills | Status: DC

## 2022-07-25 NOTE — Telephone Encounter (Signed)
I called and spoke with the patient and advised of the medications being sent in on Friday February 9th. Patient verbalized understanding and stated that she did not receive a call from the pharmacy. I advised that I would resend them in, patient verbalized understanding. Medications have been sent back into the pharmacy as name brand in order to be covered by insurance.

## 2022-07-26 ENCOUNTER — Ambulatory Visit: Payer: Medicare Other | Admitting: Allergy & Immunology

## 2022-07-31 ENCOUNTER — Ambulatory Visit (INDEPENDENT_AMBULATORY_CARE_PROVIDER_SITE_OTHER): Payer: Medicare Other

## 2022-07-31 DIAGNOSIS — J4550 Severe persistent asthma, uncomplicated: Secondary | ICD-10-CM | POA: Diagnosis not present

## 2022-08-06 ENCOUNTER — Telehealth: Payer: Self-pay | Admitting: Allergy & Immunology

## 2022-08-06 MED ORDER — LEVOCETIRIZINE DIHYDROCHLORIDE 5 MG PO TABS: 5.0000 mg | ORAL_TABLET | Freq: Every day | ORAL | 1 refills | Status: DC | PRN

## 2022-08-06 NOTE — Telephone Encounter (Signed)
Patient is requesting refill for levocetirizine to be sent to [facility]. Patient is requesting a 3 month supply.   Please advise

## 2022-08-06 NOTE — Telephone Encounter (Signed)
Patient has been notified that the levocetirizine  has been sent to [facility]. Patient is requesting a 3 month supply.

## 2022-08-14 ENCOUNTER — Ambulatory Visit (INDEPENDENT_AMBULATORY_CARE_PROVIDER_SITE_OTHER): Payer: Medicare Other

## 2022-08-14 DIAGNOSIS — J4550 Severe persistent asthma, uncomplicated: Secondary | ICD-10-CM | POA: Diagnosis not present

## 2022-08-28 ENCOUNTER — Ambulatory Visit (INDEPENDENT_AMBULATORY_CARE_PROVIDER_SITE_OTHER): Payer: Medicare Other

## 2022-08-28 DIAGNOSIS — J4550 Severe persistent asthma, uncomplicated: Secondary | ICD-10-CM | POA: Diagnosis not present

## 2022-09-11 ENCOUNTER — Ambulatory Visit (INDEPENDENT_AMBULATORY_CARE_PROVIDER_SITE_OTHER): Payer: Medicare Other

## 2022-09-11 DIAGNOSIS — J4550 Severe persistent asthma, uncomplicated: Secondary | ICD-10-CM

## 2022-09-25 ENCOUNTER — Ambulatory Visit (INDEPENDENT_AMBULATORY_CARE_PROVIDER_SITE_OTHER): Payer: Medicare Other | Admitting: *Deleted

## 2022-09-25 DIAGNOSIS — J4550 Severe persistent asthma, uncomplicated: Secondary | ICD-10-CM

## 2022-09-27 ENCOUNTER — Ambulatory Visit: Payer: Medicare Other | Admitting: Allergy & Immunology

## 2022-10-02 ENCOUNTER — Ambulatory Visit: Payer: Medicare Other | Admitting: Allergy & Immunology

## 2022-10-09 ENCOUNTER — Ambulatory Visit: Payer: Medicare Other

## 2022-10-10 ENCOUNTER — Ambulatory Visit: Payer: Medicare Other

## 2022-10-12 ENCOUNTER — Ambulatory Visit (INDEPENDENT_AMBULATORY_CARE_PROVIDER_SITE_OTHER): Payer: Medicare Other

## 2022-10-12 DIAGNOSIS — J4550 Severe persistent asthma, uncomplicated: Secondary | ICD-10-CM

## 2022-10-25 ENCOUNTER — Ambulatory Visit (INDEPENDENT_AMBULATORY_CARE_PROVIDER_SITE_OTHER): Payer: Medicare Other

## 2022-10-25 DIAGNOSIS — J4550 Severe persistent asthma, uncomplicated: Secondary | ICD-10-CM

## 2022-10-30 ENCOUNTER — Encounter: Payer: Self-pay | Admitting: Allergy & Immunology

## 2022-10-30 ENCOUNTER — Other Ambulatory Visit: Payer: Self-pay

## 2022-10-30 ENCOUNTER — Ambulatory Visit (INDEPENDENT_AMBULATORY_CARE_PROVIDER_SITE_OTHER): Payer: Medicare Other | Admitting: Allergy & Immunology

## 2022-10-30 VITALS — BP 150/80 | HR 86 | Temp 98.4°F | Ht 68.0 in | Wt 148.9 lb

## 2022-10-30 DIAGNOSIS — K219 Gastro-esophageal reflux disease without esophagitis: Secondary | ICD-10-CM

## 2022-10-30 DIAGNOSIS — Z9981 Dependence on supplemental oxygen: Secondary | ICD-10-CM | POA: Diagnosis not present

## 2022-10-30 DIAGNOSIS — J4489 Other specified chronic obstructive pulmonary disease: Secondary | ICD-10-CM

## 2022-10-30 DIAGNOSIS — J3089 Other allergic rhinitis: Secondary | ICD-10-CM

## 2022-10-30 MED ORDER — MONTELUKAST SODIUM 10 MG PO TABS: ORAL_TABLET | ORAL | 1 refills | Status: DC

## 2022-10-30 MED ORDER — LEVOCETIRIZINE DIHYDROCHLORIDE 5 MG PO TABS: 5.0000 mg | ORAL_TABLET | Freq: Every day | ORAL | 1 refills | Status: DC | PRN

## 2022-10-30 MED ORDER — BUDESONIDE 0.5 MG/2ML IN SUSP: RESPIRATORY_TRACT | 3 refills | Status: DC

## 2022-10-30 MED ORDER — ALBUTEROL SULFATE HFA 108 (90 BASE) MCG/ACT IN AERS: 2.0000 | INHALATION_SPRAY | RESPIRATORY_TRACT | 3 refills | Status: DC | PRN
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
## 2022-10-30 NOTE — Patient Instructions (Addendum)
Moderate persistent asthma with COPD overlap:  - Lung function looked a bit better today.  - Continue with oxygen as needed.  - We are not going to make any changes today.  - Daily controller medication(s):       AM: Pulmicort (budesonide 0.5mg ) + Brovana (aformoterol) + Yupelri + Dalisrep + prednisone 10mg       PM: Pulmicort (budesonide 0.5mg ) + Brovana (aformoterol)       EVERY TWO WEEKS: Xolair 150mg  - Rescue medications: Xopenex (levalbuterol) + Atrovent (ipratropium) mixed together every 4-6 hours as needed via nebulizer - Asthma control goals:  * Full participation in all desired activities (may need albuterol before activity) * Albuterol use two time or less a week on average (not counting use with activity) * Cough interfering with sleep two time or less a month * Oral steroids no more than once a year * No hospitalizations * Minimize medical procedures   2. Allergic rhinitis - Continue with montelukast 10mg  daily.  - Continue with levocetirizine 5mg  daily   3. Return in about 4 months (around 03/02/2023).    Please inform us of any Emergency Department visits, hospitalizations, or changes in symptoms. Call us before going to the ED for breathing or allergy symptoms since we might be able to fit you in for a sick visit. Feel free to contact us anytime with any questions, problems, or concerns.  It was a pleasure to see you again today!  Websites that have reliable patient information: 1. American Academy of Asthma, Allergy, and Immunology: www.aaaai.org 2. Food Allergy Research and Education (FARE): foodallergy.org 3. Mothers of Asthmatics: http://www.asthmacommunitynetwork.org 4. American College of Allergy, Asthma, and Immunology: www.acaai.org   COVID-19 Vaccine Information can be found at: [email or web address] For questions related to vaccine distribution or appointments, please email [email or web address]  or call [phone].   We realize that you might be concerned about having an allergic reaction to the COVID19 vaccines. To help with that concern, WE ARE OFFERING THE COVID19 VACCINES IN OUR OFFICE! Ask the front desk for dates!     "Like" [place] on Facebook and Instagram for our latest updates!      A healthy democracy works best when [employer] participate! Make sure you are registered to vote! If you have moved or changed any of your contact information, you will need to get this updated before voting!  In some cases, you MAY be able to register to vote online: [email or web address]

## 2022-10-31 ENCOUNTER — Other Ambulatory Visit (HOSPITAL_COMMUNITY): Payer: Self-pay

## 2022-11-01 ENCOUNTER — Encounter: Payer: Self-pay | Admitting: Allergy & Immunology

## 2022-11-09 ENCOUNTER — Ambulatory Visit (INDEPENDENT_AMBULATORY_CARE_PROVIDER_SITE_OTHER): Payer: Medicare Other

## 2022-11-09 DIAGNOSIS — J4550 Severe persistent asthma, uncomplicated: Secondary | ICD-10-CM | POA: Diagnosis not present

## 2022-11-12 ENCOUNTER — Other Ambulatory Visit (HOSPITAL_COMMUNITY): Payer: Self-pay

## 2022-11-12 ENCOUNTER — Telehealth: Payer: Self-pay

## 2022-11-12 NOTE — Telephone Encounter (Signed)
Patient Advocate Encounter   Received notification from [facility] that prior authorization is required for Budesonide 0.5MG /2ML suspension   Submitted: 11-12-222 Key [id number]  Status is pending

## 2022-11-13 NOTE — Telephone Encounter (Signed)
Called [facility] [address] - spoke to [place], Pharmacist - DOB verified-  Advised of below notation.  Haakib reran prescription using Medicare B ICD: J45.50 due to J45.89 not popping up in their system.  Prescription approved - copay $56.51.  Haakib stated he will begin filling prescription - will notify patient via text once it is ready.  Called patient - No DPR on file - LMOVM to contact office regarding her Budesonide (Pulmicort) prescription.  When patient call back - please advise of above notation.

## 2022-11-13 NOTE — Telephone Encounter (Signed)
Your request was denied We have denied coverage or payment under your Medicare Part D benefit for the following prescription drug(s)  that you or your prescriber requested: BUDESONIDE Suspension Why did we deny your request? We denied this request under Medicare Part D because: Drugs used in a nebulizer at your home are covered  under Medicare Part B. Since you are using this medication with a nebulizer in your home, it is covered under  Medicare Part B per Section 1861(n) of the Social Security Act. Your Medicare Part D drug plan cannot cover  a drug already covered by Medicare Part B.

## 2022-11-21 ENCOUNTER — Telehealth: Payer: Self-pay

## 2022-11-21 NOTE — Telephone Encounter (Signed)
Returned pt's call, she has c/o intermittently feeling dizzy and vision does not seem right with her fingers twitching on both hands. She states this comes on for the first half of the day and then gets better in afternoons for the last 3 weeks or so. I have advised her to go to the ED if this persists or if she is experiencing any symptoms of a stroke which we reviewed. She feels this may be related to her seizure disorder/medications and I have advised her to make an appt with her neurologist. I called her back to move up her carotid u/s as she is worried about that as well. She confirmed she let her neurologist know of her concerns and they gave her an appt at the end of this month.

## 2022-11-23 ENCOUNTER — Ambulatory Visit (INDEPENDENT_AMBULATORY_CARE_PROVIDER_SITE_OTHER): Payer: Medicare Other

## 2022-11-23 DIAGNOSIS — J4550 Severe persistent asthma, uncomplicated: Secondary | ICD-10-CM

## 2022-11-30 ENCOUNTER — Telehealth: Payer: Self-pay | Admitting: Allergy & Immunology

## 2022-11-30 NOTE — Telephone Encounter (Signed)
Patient called stating she has a lot of mucus and is asking for the nurse to call her in reference for a prescription for prednisone please advise

## 2022-11-30 NOTE — Telephone Encounter (Signed)
Called patient to find out what is going on. Patient states that she has congestion and it started yesterday. She states that she does not know if it is her asthma or something else. Patient took an extra prednisone pill to see if that would help today but it did not help. (She states she takes prednisone daily) If anything is to be sent in today, [facility] would [facility] [address].

## 2022-12-01 MED ORDER — ZITHROMAX 250 MG PO TABS: ORAL_TABLET | ORAL | 0 refills | Status: DC

## 2022-12-01 NOTE — Telephone Encounter (Signed)
I was not at work on Friday. I apologize.   I am going to send in an azithromycin course.   Malachi Bonds, MD [facility]

## 2022-12-02 ENCOUNTER — Other Ambulatory Visit: Payer: Self-pay | Admitting: Allergy & Immunology

## 2022-12-03 NOTE — Telephone Encounter (Signed)
I called patient and informed that the azithromycin has been sent in. I informed that if she is not feeling better to give [place] a callback.

## 2022-12-07 ENCOUNTER — Ambulatory Visit (HOSPITAL_COMMUNITY)
Admission: RE | Admit: 2022-12-07 | Discharge: 2022-12-07 | Disposition: A | Discharge status: Home / Self Care | Payer: Medicare Other | Source: Ambulatory Visit | Attending: Vascular Surgery | Admitting: Vascular Surgery

## 2022-12-07 ENCOUNTER — Ambulatory Visit (INDEPENDENT_AMBULATORY_CARE_PROVIDER_SITE_OTHER): Payer: Medicare Other | Admitting: Physician Assistant

## 2022-12-07 VITALS — BP 131/79 | HR 70 | Temp 97.8°F | Resp 18 | Ht 68.0 in | Wt 150.0 lb

## 2022-12-07 DIAGNOSIS — R42 Dizziness and giddiness: Secondary | ICD-10-CM

## 2022-12-07 DIAGNOSIS — I6523 Occlusion and stenosis of bilateral carotid arteries: Secondary | ICD-10-CM

## 2022-12-07 NOTE — Progress Notes (Signed)
Office Note     CC:  follow up Requesting Provider:  Doreen Salvage, PA-C  HPI: Anne Rich is a 72 y.o. ([birth year]) female who presents for surveillance of carotid artery stenosis.  She is followed every 6 months for left ICA stenosis estimated to be 60 to 79%.  She has history of seizure disorder.  Over the past 2 months she has developed profound dizziness especially in the mornings.  She was seen by her neurologist yesterday who plans on ordering an MRI.  She was also found to have some orthostatic hypotension.  She does not have any lateralizing symptoms.  She also denies any amaurosis fugax or slurring speech.  She stopped taking her aspirin due to bruising on her arms.  She denies tobacco use.   Past Medical History:  Diagnosis Date   Allergic rhinitis    Asthma    COPD (chronic obstructive pulmonary disease) (HCC)     Past Surgical History:  Procedure Laterality Date   ABDOMINAL HYSTERECTOMY     APPENDECTOMY     CESAREAN SECTION      Social History   Socioeconomic History   Marital status: Married    Spouse name: Not on file   Number of children: Not on file   Years of education: Not on file   Highest education level: Not on file  Occupational History   Not on file  Tobacco Use   Smoking status: Former    Packs/day: 0.50    Years: 15.00    Additional pack years: 0.00    Total pack years: 7.50    Types: Cigarettes    Quit date: 06/11/1994    Years since quitting: 28.5    Passive exposure: Never   Smokeless tobacco: Never  Vaping Use   Vaping Use: Never used  Substance and Sexual Activity   Alcohol use: No   Drug use: No   Sexual activity: Not on file  Other Topics Concern   Not on file  Social History Narrative   Not on file   Social Determinants of Health   Financial Resource Strain: Not on file  Food Insecurity: Not on file  Transportation Needs: Not on file  Physical Activity: Not on file  Stress: Not on file  Social Connections: Not on file   Intimate Partner Violence: Not on file    Family History  Problem Relation Age of Onset   Allergic rhinitis Neg Hx    Angioedema Neg Hx    Asthma Neg Hx    Eczema Neg Hx    Immunodeficiency Neg Hx    Urticaria Neg Hx     Current Outpatient Medications  Medication Sig Dispense Refill   albuterol (VENTOLIN HFA) 108 (90 Base) MCG/ACT inhaler Inhale 2 puffs into the lungs every 4 (four) hours as needed for wheezing or shortness of breath. 54 g 3   arformoterol (BROVANA) 15 MCG/2ML NEBU Take 2 mLs (15 mcg total) by nebulization 2 (two) times daily. 120 mL 11   azithromycin (ZITHROMAX) 250 MG tablet TAKE 2 TABLETS ON THE FIRST DAY AND 1 TABLET DAILY FOR 4 MORE DAYS. WAIT 3 DAYS AND THEN REPEAT THE ENTIRE COURSE. 12 tablet 0   budesonide (PULMICORT) 0.5 MG/2ML nebulizer solution USE 1 VIAL  IN  NEBULIZER TWICE  DAILY - rinse mouth after treatment 360 mL 3   carbamazepine (TEGRETOL) 200 MG tablet Take 200-400 mg by mouth 2 (two) times daily. 200mg  tab in the morning and 400mg  at night  cyclobenzaprine (FLEXERIL) 5 MG tablet Take 5 mg by mouth daily.     diclofenac sodium (VOLTAREN) 1 % GEL Apply 2 g topically 4 (four) times daily as needed (knee pain).      EPINEPHrine (EPIPEN 2-PAK) 0.3 mg/0.3 mL IJ SOAJ injection Inject 0.3 mg into the muscle Once PRN. 1 each 2   estradiol (ESTRACE) 1 MG tablet Take 1 mg by mouth daily.  3   gabapentin (NEURONTIN) 600 MG tablet Take 600-1,200 mg by mouth 2 (two) times daily. 600mg  in the morning and 1200mg  at bedtime     ipratropium (ATROVENT) 0.02 % nebulizer solution Take 2.5 mLs (0.5 mg total) by nebulization every 4 (four) hours as needed for wheezing or shortness of breath. 75 mL 2   levalbuterol (XOPENEX) 0.63 MG/3ML nebulizer solution Take 3 mLs (0.63 mg total) by nebulization every 4 (four) hours as needed for wheezing or shortness of breath. 540 mL 1   levETIRAcetam (KEPPRA) 1000 MG tablet Take 1,000-2,000 mg by mouth 2 (two) times daily. 1000mg  in  the morning and 2000mg  at night     levocetirizine (XYZAL) 5 MG tablet Take 1 tablet (5 mg total) by mouth daily as needed for allergies (for runny nose). 90 tablet 1   lisinopril (PRINIVIL,ZESTRIL) 10 MG tablet Take 20 mg by mouth daily.     LORazepam (ATIVAN) 1 MG tablet Take 1 mg by mouth 3 (three) times daily.     meclizine (ANTIVERT) 25 MG tablet Take 25 mg by mouth 2 (two) times daily as needed for dizziness or nausea.      Melatonin 10 MG TABS Take 10 mg by mouth daily.     mirtazapine (REMERON) 30 MG tablet Take 30 mg by mouth every evening.      montelukast (SINGULAIR) 10 MG tablet TAKE 1 TABLET BY MOUTH EVERYDAY AT BEDTIME 90 tablet 1   Multiple Vitamin (MULTIVITAMIN) tablet Take 1 tablet by mouth daily.      omeprazole (PRILOSEC) 40 MG capsule Take 40 mg by mouth 2 (two) times a day.     ondansetron (ZOFRAN) 4 MG tablet TAKE 1 TABLET (4 MG TOTAL) BY MOUTH 2 (TWO) TIMES DAILY AS NEEDED FOR NAUSEA. 20 tablet 1   oxybutynin (DITROPAN) 5 MG tablet Take by mouth.     OXYGEN Inhale 3 L into the lungs at bedtime. 4 L when walking     predniSONE (DELTASONE) 10 MG tablet Take 10 mg by mouth daily with breakfast.     predniSONE (STERAPRED UNI-PAK 21 TAB) 10 MG (21) TBPK tablet Take by mouth daily. Take 6 tabs by mouth daily  for 2 days, then 5 tabs for 2 days, then 4 tabs for 2 days, then 3 tabs for 2 days, 2 tabs for 2 days, then 1 tab by mouth daily for 2 days 42 tablet 0   QUEtiapine (SEROQUEL) 25 MG tablet Take 25-50 mg by mouth at bedtime.     revefenacin (YUPELRI) 175 MCG/3ML nebulizer solution Inhale 3 mLs (175 mcg total) into the lungs daily. 90 mL 11   roflumilast (DALIRESP) 500 MCG TABS tablet Take 1 tablet (500 mcg total) by mouth daily. 90 tablet 3   TESSALON PERLES 100 MG capsule Take 1 capsule (100 mg total) by mouth 3 (three) times daily as needed for cough. 20 capsule 0   Current Facility-Administered Medications  Medication Dose Route Frequency Provider Last Rate Last Admin    dupilumab (DUPIXENT) prefilled syringe 600 mg  600 mg Subcutaneous Q14  Days Alfonse Spruce, MD   600 mg at 06/06/22 1610   omalizumab Geoffry [place]) prefilled syringe 150 mg  150 mg Subcutaneous Q14 Days Alfonse Spruce, MD   150 mg at 11/23/22 9604    Allergies  Allergen Reactions   Iodine Itching, Rash and Swelling    IV contrast    Iodine-131 Itching and Swelling    Pt states causes severe itching Pt states causes severe itching    Penicillins Hives and Swelling    Swelling of the throat   Pineapple Other (See Comments) and Swelling    Pt states causes her throat swelling Pt states causes her throat swelling    Shellfish Allergy Rash and Swelling    Had reaction to cardiac cath dye  Had seizure ,rash ,itch Oct. 2012 Had reaction to cardiac cath dye  Had seizure ,rash ,itch Oct. 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   Pravastatin Other (See Comments)    Pt states she couldn't lift her legs to walk   Shellfish-Derived Products    Xanax [Alprazolam]    Iodinated Contrast Media Rash     REVIEW OF SYSTEMS:   [X]  denotes positive finding, [ ]  denotes negative finding Cardiac  Comments:  Chest pain or chest pressure:    Shortness of breath upon exertion:    Short of breath when lying flat:    Irregular heart rhythm:        Vascular    Pain in calf, thigh, or hip brought on by ambulation:    Pain in feet at night that wakes you up from your sleep:     Blood clot in your veins:    Leg swelling:         Pulmonary    Oxygen at home:    Productive cough:     Wheezing:         Neurologic    Sudden weakness in arms or legs:     Sudden numbness in arms or legs:     Sudden onset of difficulty speaking or slurred speech:    Temporary loss of vision in one eye:     Problems with dizziness:         Gastrointestinal    Blood in stool:     Vomited blood:         Genitourinary    Burning when  urinating:     Blood in urine:        Psychiatric    Major depression:         Hematologic    Bleeding problems:    Problems with blood clotting too easily:        Skin    Rashes or ulcers:        Constitutional    Fever or chills:      PHYSICAL EXAMINATION:  Vitals:   12/07/22 0827 12/07/22 0828  BP: 127/69 131/79  Pulse: 70   Resp: 18   Temp: 97.8 F (36.6 C)   TempSrc: Temporal   SpO2: 97%   Weight: 150 lb (68 kg)   Height: 5\' 8"  (1.727 m)     General:  WDWN in NAD; vital signs documented above Gait: Not observed HENT: WNL, normocephalic Pulmonary: normal non-labored breathing , without Rales, rhonchi,  wheezing Cardiac: regular HR Abdomen: soft, NT, no masses Skin: without rashes Vascular Exam/Pulses: symmetrical radial pulses Extremities: without ischemic changes, without  Gangrene , without cellulitis; without open wounds;  Musculoskeletal: no muscle wasting or atrophy  Neurologic: A&O X 3; CN grossly intact Psychiatric:  The pt has Normal affect.   Non-Invasive Vascular Imaging:   Right ICA 1 to 39% stenosis Left ICA 60 to 79% stenosis    ASSESSMENT/PLAN:: 72 y.o. female here for follow up for carotid artery stenosis  -Anne Rich is a 72 year old female who returns to clinic for carotid artery surveillance.  Carotid duplex is unchanged demonstrating 60 to 79% stenosis of the left ICA.  Unfortunately patient has a recent 3-month history of profound dizziness especially in the mornings that keeps her from her day-to-day activities.  She was evaluated by her neurologist yesterday who plans on obtaining an MRI of the brain.  She does not have any lateralizing symptoms.  She also is without amaurosis fugax or slurring speech.  Her carotid duplex is stable compared to 6 months ago.  I will reach out to her neurologist however do not suspect this represents asymptomatic left ICA lesion.  Tentative plan will be to repeat carotid duplex in 6 months.  I  encouraged her to resume her 81 mg of aspirin daily.   Emilie Rutter, PA-C [facility] [phone]  Clinic MD:   Karin Lieu

## 2022-12-10 ENCOUNTER — Ambulatory Visit (INDEPENDENT_AMBULATORY_CARE_PROVIDER_SITE_OTHER): Payer: Medicare Other

## 2022-12-10 DIAGNOSIS — J4550 Severe persistent asthma, uncomplicated: Secondary | ICD-10-CM

## 2022-12-11 ENCOUNTER — Ambulatory Visit: Payer: Medicare Other

## 2022-12-22 ENCOUNTER — Other Ambulatory Visit: Payer: Self-pay

## 2022-12-22 DIAGNOSIS — I6523 Occlusion and stenosis of bilateral carotid arteries: Secondary | ICD-10-CM

## 2022-12-23 ENCOUNTER — Encounter (HOSPITAL_BASED_OUTPATIENT_CLINIC_OR_DEPARTMENT_OTHER): Payer: Self-pay

## 2022-12-23 ENCOUNTER — Emergency Department (HOSPITAL_BASED_OUTPATIENT_CLINIC_OR_DEPARTMENT_OTHER)
Admission: EM | Admit: 2022-12-23 | Discharge: 2022-12-23 | Disposition: A | Discharge status: Home / Self Care | Payer: Medicare Other | Attending: Emergency Medicine | Admitting: Emergency Medicine

## 2022-12-23 ENCOUNTER — Emergency Department (HOSPITAL_BASED_OUTPATIENT_CLINIC_OR_DEPARTMENT_OTHER): Payer: Medicare Other

## 2022-12-23 DIAGNOSIS — R42 Dizziness and giddiness: Secondary | ICD-10-CM | POA: Diagnosis present

## 2022-12-23 DIAGNOSIS — J449 Chronic obstructive pulmonary disease, unspecified: Secondary | ICD-10-CM | POA: Insufficient documentation

## 2022-12-23 DIAGNOSIS — Z87891 Personal history of nicotine dependence: Secondary | ICD-10-CM | POA: Insufficient documentation

## 2022-12-23 LAB — URINALYSIS, ROUTINE W REFLEX MICROSCOPIC
Bilirubin Urine: NEGATIVE
Glucose, UA: NEGATIVE mg/dL
Ketones, ur: NEGATIVE mg/dL
Leukocytes,Ua: NEGATIVE
Nitrite: NEGATIVE
Protein, ur: NEGATIVE mg/dL
Specific Gravity, Urine: 1.025 (ref 1.005–1.030)
pH: 6.5 (ref 5.0–8.0)

## 2022-12-23 LAB — CBC
HCT: 33.5 % — ABNORMAL LOW (ref 36.0–46.0)
Hemoglobin: 11.2 g/dL — ABNORMAL LOW (ref 12.0–15.0)
MCH: 30.9 pg (ref 26.0–34.0)
MCHC: 33.4 g/dL (ref 30.0–36.0)
MCV: 92.3 fL (ref 80.0–100.0)
Platelets: 355 10*3/uL (ref 150–400)
RBC: 3.63 MIL/uL — ABNORMAL LOW (ref 3.87–5.11)
RDW: 13.3 % (ref 11.5–15.5)
WBC: 7.1 10*3/uL (ref 4.0–10.5)
nRBC: 0 % (ref 0.0–0.2)

## 2022-12-23 LAB — BASIC METABOLIC PANEL
Anion gap: 9 (ref 5–15)
BUN: 12 mg/dL (ref 8–23)
CO2: 24 mmol/L (ref 22–32)
Calcium: 8.6 mg/dL — ABNORMAL LOW (ref 8.9–10.3)
Chloride: 97 mmol/L — ABNORMAL LOW (ref 98–111)
Creatinine, Ser: 0.78 mg/dL (ref 0.44–1.00)
GFR, Estimated: >60 mL/min (ref >60)
Glucose, Bld: 105 mg/dL — ABNORMAL HIGH (ref 70–99)
Potassium: 4.7 mmol/L (ref 3.5–5.1)
Sodium: 130 mmol/L — ABNORMAL LOW (ref 135–145)

## 2022-12-23 LAB — URINALYSIS, MICROSCOPIC (REFLEX)

## 2022-12-23 LAB — ETHANOL: Alcohol, Ethyl (B): <10 mg/dL (ref <10)

## 2022-12-23 LAB — TROPONIN I (HIGH SENSITIVITY): Troponin I (High Sensitivity): 3 ng/L (ref <18)

## 2022-12-23 LAB — CBG MONITORING, ED: Glucose-Capillary: 127 mg/dL — ABNORMAL HIGH (ref 70–99)

## 2022-12-23 MED ORDER — SODIUM CHLORIDE 0.9 % IV BOLUS
500.0000 mL | Freq: Once | INTRAVENOUS | Status: AC
  Administered 2022-12-23: 500 mL via INTRAVENOUS

## 2022-12-23 MED ORDER — DIAZEPAM 5 MG PO TABS
5.0000 mg | ORAL_TABLET | Freq: Once | ORAL | Status: AC
  Administered 2022-12-23: 5 mg via ORAL
  Filled 2022-12-23: qty 1

## 2022-12-23 MED ORDER — MECLIZINE HCL 25 MG PO TABS
25.0000 mg | ORAL_TABLET | Freq: Once | ORAL | Status: AC
  Administered 2022-12-23: 25 mg via ORAL
  Filled 2022-12-23: qty 1

## 2022-12-23 NOTE — ED Triage Notes (Signed)
Pt reports getting up for glass of water and everything became gray and feeling of warmth head to toe  and passed out. Pt reports crawling to door. Pt denies any pain. Reports 80 % carotid stenosis . Not feeling completely back to normal and remains dizzy

## 2022-12-23 NOTE — ED Provider Notes (Signed)
[place] [facility] Provider Note  CSN: [id number] Arrival date & time: 12/23/22 1210  Chief Complaint(s) Dizziness and Fall  HPI Anne Rich is a 72 y.o. female with past medical history as below, significant for Asthma, COPD, vertigo, insomnia, migraine, anxiety who presents to the ED with complaint of dizziness.  Patient reports similar symptoms in the past attributed to vertigo, she takes meclizine at home and when she takes her meclizine with her Ativan her symptoms typically resolve.  She last took this combination last week which did resolve her symptoms.  Reports that she did not want take it today because she was worried it might upset her stomach.  Reports she experiences a room spinning sensation when she stands up quickly or turns her head at times.  No associated nausea or vomiting.  Reports [facility] and tolerated well.  When she had the dizziness episodes today she described as very sudden onset, brought by head turning, worsened when she tried to walk.  She had a lowered self to the ground, after that she started praying and the symptoms improved.  Then she crawled to the couch and lied down and continued to feel better.  Past Medical History Past Medical History:  Diagnosis Date   Allergic rhinitis    Asthma    COPD (chronic obstructive pulmonary disease) (HCC)    Patient Active Problem List   Diagnosis Date Noted   At increased risk of exposure to COVID-19 virus 05/07/2022   Cough 05/07/2022   Current mild episode of major depressive disorder (HCC) 05/23/2021   Ecchymosis 07/27/2020   COVID-19 virus infection 06/20/2019   Medication management 05/29/2019   Severe persistent asthma without complication 03/16/2019   Physical deconditioning 01/06/2019   Counseling regarding end of life decision making 01/06/2019   Lumbosacral dysfunction 09/04/2018   Chronic respiratory failure with hypoxia (HCC), 3L  home O2 05/01/2018   Statin intolerance 02/13/2018   Adverse effect of other drugs, medicaments and biological substances, initial encounter 01/01/2018   Bilateral carotid artery stenosis 10/14/2017   Spells of decreased attentiveness 09/10/2017   Cortical age-related cataract of left eye 10/24/2016   Nuclear sclerotic cataract of left eye 10/24/2016   History of epistaxis 07/19/2016   Advance directive in chart 04/04/2016   Idiopathic peripheral neuropathy 03/12/2016   Loss of appetite 03/12/2016   Psychophysiological insomnia 03/12/2016   Oxygen dependent 02/28/2016   Easy bruising 12/26/2015   Gastroesophageal reflux disease 12/02/2015   Coronary artery calcification seen on CT scan 09/20/2015   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HCC) 09/20/2015   Abnormal weight loss 09/06/2015   Herniated lumbar intervertebral disc 07/21/2015   Migraine without aura and without status migrainosus, not intractable 07/21/2015   Anxiety 06/28/2015   Chronic constipation 06/28/2015   Chronic use of benzodiazepine for therapeutic purpose 06/28/2015   Former smoker 06/28/2015   Hepatic steatosis 06/28/2015   History of cerebrovascular accident (CVA) with residual deficit 06/28/2015   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   Hypoxemia 06/28/2015   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/28/2015   Osteopenia 06/28/2015   Unspecified convulsions (HCC) 06/28/2015   Acute poliomyelitis, unspecified 04/18/2015   CVA (cerebral vascular accident) (HCC) 04/18/2015   Essential hypertension 04/18/2015   Other allergic rhinitis 02/10/2015   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 Authorizing Provider  albuterol (VENTOLIN HFA) 108 (90 Base) MCG/ACT inhaler Inhale 2 puffs into the lungs every 4 (four) hours as needed for wheezing or shortness of breath. 10/30/22   Alfonse Spruce, MD  arformoterol (BROVANA) 15 MCG/2ML NEBU Take 2 mLs (15 mcg total) by nebulization 2 (two) times daily. 06/14/22   Mannam, Colbert Coyer, MD  azithromycin (ZITHROMAX) 250 MG tablet TAKE 2 TABLETS ON THE FIRST DAY AND 1 TABLET DAILY FOR 4 MORE DAYS. WAIT 3 DAYS AND THEN REPEAT THE ENTIRE COURSE. 12/03/22   Alfonse Spruce, MD  budesonide (PULMICORT) 0.5 MG/2ML nebulizer solution USE 1 VIAL  IN  NEBULIZER TWICE  DAILY - rinse mouth after treatment 10/30/22   Alfonse Spruce, MD  carbamazepine (TEGRETOL) 200 MG tablet Take 200-400 mg by mouth 2 (two) times daily. 200mg  tab in the morning and 400mg  at night    [provider]  cyclobenzaprine (FLEXERIL) 5 MG tablet Take 5 mg by mouth daily.    [provider]  diclofenac sodium (VOLTAREN) 1 % GEL Apply 2 g topically 4 (four) times daily as needed (knee pain).  11/10/18   [provider]  EPINEPHrine (EPIPEN 2-PAK) 0.3 mg/0.3 mL IJ SOAJ injection Inject 0.3 mg into the muscle Once PRN. 03/14/20   Alfonse Spruce, MD  estradiol (ESTRACE) 1 MG tablet Take 1 mg by mouth daily. 11/19/17   [provider]  gabapentin (NEURONTIN) 600 MG tablet Take 600-1,200 mg by mouth 2 (two) times daily. 600mg  in the morning and 1200mg  at bedtime 11/25/18   [provider]  ipratropium (ATROVENT) 0.02 % nebulizer solution Take 2.5 mLs (0.5 mg total) by nebulization every 4 (four) hours as needed for wheezing or shortness of breath. 04/13/22   Alfonse Spruce, MD  levalbuterol Pauline Aus) 0.63 MG/3ML nebulizer solution Take 3 mLs (0.63 mg total) by nebulization every 4 (four) hours as needed for wheezing or shortness of  breath. 05/18/22   Alfonse Spruce, MD  levETIRAcetam (KEPPRA) 1000 MG tablet Take 1,000-2,000 mg by mouth 2 (two) times daily. 1000mg  in the morning and 2000mg  at night    [provider]  levocetirizine (XYZAL) 5 MG tablet Take 1 tablet (5 mg total) by mouth daily as needed for allergies (for runny nose). 10/30/22   Alfonse Spruce, MD  lisinopril (PRINIVIL,ZESTRIL) 10 MG tablet Take 20 mg by mouth daily. 12/10/17   [provider]  LORazepam (ATIVAN) 1 MG tablet Take 1 mg by mouth 3 (three) times daily. 03/08/22   [provider]  meclizine (ANTIVERT) 25 MG tablet Take 25 mg by mouth 2 (two) times daily as needed for dizziness or nausea.  01/27/13   [provider]  Melatonin 10 MG TABS Take 10 mg by mouth daily.    [provider]  mirtazapine (REMERON) 30 MG tablet Take 30 mg by mouth every evening.  12/06/18   [provider]  montelukast (SINGULAIR) 10 MG tablet TAKE 1 TABLET BY MOUTH EVERYDAY AT BEDTIME 10/30/22   Alfonse Spruce, MD  Multiple Vitamin (MULTIVITAMIN) tablet Take 1 tablet by mouth daily.     [provider]  omeprazole (PRILOSEC) 40 MG capsule Take 40 mg by  mouth 2 (two) times a day.    [provider]  ondansetron (ZOFRAN) 4 MG tablet TAKE 1 TABLET (4 MG TOTAL) BY MOUTH 2 (TWO) TIMES DAILY AS NEEDED FOR NAUSEA. 12/26/21   Mannam, Colbert Coyer, MD  oxybutynin (DITROPAN) 5 MG tablet Take by mouth. 02/16/21   [provider]  OXYGEN Inhale 3 L into the lungs at bedtime. 4 L when walking    [provider]  predniSONE (DELTASONE) 10 MG tablet Take 10 mg by mouth daily with breakfast.    [provider]  predniSONE (STERAPRED UNI-PAK 21 TAB) 10 MG (21) TBPK tablet Take by mouth daily. Take 6 tabs by mouth daily  for 2 days, then 5 tabs for 2 days, then 4 tabs for 2 days, then 3 tabs for 2 days, 2 tabs for 2 days, then 1 tab by mouth daily for 2 days 05/11/22   Solon Augusta S, PA   QUEtiapine (SEROQUEL) 25 MG tablet Take 25-50 mg by mouth at bedtime. 09/05/21   [provider]  revefenacin (YUPELRI) 175 MCG/3ML nebulizer solution Inhale 3 mLs (175 mcg total) into the lungs daily. 06/14/22   Mannam, Colbert Coyer, MD  roflumilast (DALIRESP) 500 MCG TABS tablet Take 1 tablet (500 mcg total) by mouth daily. 03/19/22   Mannam, Colbert Coyer, MD  TESSALON PERLES 100 MG capsule Take 1 capsule (100 mg total) by mouth 3 (three) times daily as needed for cough. 07/25/22   Alfonse Spruce, MD                                                                                                                                    Past Surgical History Past Surgical History:  Procedure Laterality Date   ABDOMINAL HYSTERECTOMY     APPENDECTOMY     CESAREAN SECTION     Family History Family History  Problem Relation Age of Onset   Allergic rhinitis Neg Hx    Angioedema Neg Hx    Asthma Neg Hx    Eczema Neg Hx    Immunodeficiency Neg Hx    Urticaria Neg Hx     Social History Social History   Tobacco Use   Smoking status: Former    Current packs/day: 0.00    Average packs/day: 0.5 packs/day for 15.0 years (7.5 ttl pk-yrs)    Types: Cigarettes    Start date: 06/12/1979    Quit date: 06/11/1994    Years since quitting: 28.5    Passive exposure: Never   Smokeless tobacco: Never  Vaping Use   Vaping status: Never Used  Substance Use Topics   Alcohol use: No   Drug use: No   Allergies Iodine, Iodine-131, Penicillins, Pineapple, Shellfish allergy, Molds & smuts, Pravastatin, Shellfish-derived products, Xanax [alprazolam], and Iodinated contrast media  Review of Systems Review of Systems  Constitutional:  Positive for fatigue. Negative for chills and fever.  HENT:  Negative for facial swelling and  trouble swallowing.   Eyes:  Negative for photophobia and visual disturbance.  Respiratory:  Negative for cough and shortness of breath.   Cardiovascular:  Negative for chest pain and  palpitations.  Gastrointestinal:  Negative for abdominal pain, nausea and vomiting.  Endocrine: Negative for polydipsia and polyuria.  Genitourinary:  Negative for difficulty urinating and hematuria.  Musculoskeletal:  Negative for gait problem and joint swelling.  Skin:  Negative for pallor and rash.  Neurological:  Positive for dizziness and light-headedness. Negative for syncope and headaches.  Psychiatric/Behavioral:  Negative for agitation and confusion.     Physical Exam Vital Signs  I have reviewed the triage vital signs BP 139/70   Pulse 64   Temp 98.6 F (37 C)   Resp 16   Ht 5\' 8"  (1.727 m)   Wt 68 kg   SpO2 97%   BMI 22.81 kg/m  Physical Exam Vitals and nursing note reviewed.  Constitutional:      General: She is not in acute distress.    Appearance: Normal appearance. She is not ill-appearing, toxic-appearing or diaphoretic.  HENT:     Head: Normocephalic and atraumatic.     Right Ear: External ear normal.     Left Ear: External ear normal.     Nose: Nose normal.     Mouth/Throat:     Mouth: Mucous membranes are moist.  Eyes:     General: No scleral icterus.       Right eye: No discharge.        Left eye: No discharge.     Extraocular Movements: Extraocular movements intact.     Pupils: Pupils are equal, round, and reactive to light.  Cardiovascular:     Rate and Rhythm: Normal rate and regular rhythm.     Pulses: Normal pulses.     Heart sounds: Normal heart sounds.  Pulmonary:     Effort: Pulmonary effort is normal. No respiratory distress.     Breath sounds: Normal breath sounds. No stridor.  Abdominal:     General: Abdomen is flat. There is no distension.     Palpations: Abdomen is soft.     Tenderness: There is no abdominal tenderness.  Musculoskeletal:     Cervical back: No rigidity.     Right lower leg: No edema.     Left lower leg: No edema.  Lymphadenopathy:     Cervical: No cervical adenopathy.  Skin:    General: Skin is warm and dry.      Capillary Refill: Capillary refill takes less than 2 seconds.  Neurological:     Mental Status: She is alert and oriented to person, place, and time.     GCS: GCS eye subscore is 4. GCS verbal subscore is 5. GCS motor subscore is 6.     Cranial Nerves: Cranial nerves 2-12 are intact. No dysarthria or facial asymmetry.     Sensory: Sensation is intact.     Motor: Motor function is intact. No tremor.     Coordination: Coordination is intact. Finger-Nose-Finger Test normal.     Comments: Gait testing deferred 2/2 pt safety Strength 5/5 BLUE BLLE   Psychiatric:        Mood and Affect: Mood normal.        Behavior: Behavior normal. Behavior is cooperative.     ED Results and Treatments Labs (all labs ordered are listed, but only abnormal results are displayed) Labs Reviewed  BASIC METABOLIC PANEL - Abnormal; Notable for the following components:  Result Value   Sodium 130 (*)    Chloride 97 (*)    Glucose, Bld 105 (*)    Calcium 8.6 (*)    All other components within normal limits  CBC - Abnormal; Notable for the following components:   RBC 3.63 (*)    Hemoglobin 11.2 (*)    HCT 33.5 (*)    All other components within normal limits  URINALYSIS, ROUTINE W REFLEX MICROSCOPIC - Abnormal; Notable for the following components:   Hgb urine dipstick TRACE (*)    All other components within normal limits  URINALYSIS, MICROSCOPIC (REFLEX) - Abnormal; Notable for the following components:   Bacteria, UA FEW (*)    All other components within normal limits  CBG MONITORING, ED - Abnormal; Notable for the following components:   Glucose-Capillary 127 (*)    All other components within normal limits  ETHANOL  TROPONIN I (HIGH SENSITIVITY)                                                                                                                          Radiology No results found.  Pertinent labs & imaging results that were available during my care of the patient were  reviewed by me and considered in my medical decision making (see MDM for details).  Medications Ordered in ED Medications  sodium chloride 0.9 % bolus 500 mL (0 mLs Intravenous Stopped 12/23/22 1554)  meclizine (ANTIVERT) tablet 25 mg (25 mg Oral Given 12/23/22 1414)  diazepam (VALIUM) tablet 5 mg (5 mg Oral Given 12/23/22 1415)                                                                                                                                     Procedures Procedures  (including critical care time)  Medical Decision Making / ED Course    Medical Decision Making:    Rielyn Krupinski is a 72 y.o. female with past medical history as below, significant for Asthma, COPD, vertigo, insomnia, migraine, anxiety who presents to the ED with complaint of dizziness. . The complaint involves an extensive differential diagnosis and also carries with it a high risk of complications and morbidity.  Serious etiology was considered. Ddx includes but is not limited to: vertigo central vs peripheral, medication effect, psychiatric, metabolic,  acs etc  Complete initial physical exam performed, notably the patient  was nad, neuro exam stable  Reviewed and confirmed nursing documentation for past medical history, family history, social history.  Vital signs reviewed.      Patient here with recurrence of her chronic dizziness Her neurologic exam is stable Laboratory evaluation is also stable Chest x-ray stable, CT head pending,  Patient reports that she [facility] Symptoms are c/w her prior episodes of dizziness / vertigo Improving w/ meclizine and valium Signed out to incoming EDP pending CTH and re-check, if symptoms resolved or continued to improve she can likely be discharged home        Additional history obtained: -Additional history obtained from na -External records from outside source obtained and reviewed including: Chart review including previous notes, labs,  imaging, consultation notes including prior labs and imaging, home medications   Lab Tests: -I ordered, reviewed, and interpreted labs.   The pertinent results include:   Labs Reviewed  BASIC METABOLIC PANEL - Abnormal; Notable for the following components:      Result Value   Sodium 130 (*)    Chloride 97 (*)    Glucose, Bld 105 (*)    Calcium 8.6 (*)    All other components within normal limits  CBC - Abnormal; Notable for the following components:   RBC 3.63 (*)    Hemoglobin 11.2 (*)    HCT 33.5 (*)    All other components within normal limits  URINALYSIS, ROUTINE W REFLEX MICROSCOPIC - Abnormal; Notable for the following components:   Hgb urine dipstick TRACE (*)    All other components within normal limits  URINALYSIS, MICROSCOPIC (REFLEX) - Abnormal; Notable for the following components:   Bacteria, UA FEW (*)    All other components within normal limits  CBG MONITORING, ED - Abnormal; Notable for the following components:   Glucose-Capillary 127 (*)    All other components within normal limits  ETHANOL  TROPONIN I (HIGH SENSITIVITY)    Notable for mild hyponatremia, give ivf  EKG   EKG Interpretation Date/Time:  Sunday December 23 2022 12:31:32 EDT Ventricular Rate:  68 PR Interval:  167 QRS Duration:  89 QT Interval:  428 QTC Calculation: 456 R Axis:   76  Text Interpretation: Sinus rhythm Nonspecific T abnormalities, lateral leads Interpretation limited secondary to artifact Confirmed by Tanda Rockers ([id number]) on 12/23/2022 3:19:57 PM         Imaging Studies ordered: I ordered imaging studies including CTH CXR I independently visualized the following imaging with scope of interpretation limited to determining acute life threatening conditions related to emergency care; findings noted above, significant for CXR wnl, CTH pending at shift change I independently visualized and interpreted imaging. I agree with the radiologist interpretation   Medicines ordered  and prescription drug management: Meds ordered this encounter  Medications   sodium chloride 0.9 % bolus 500 mL   meclizine (ANTIVERT) tablet 25 mg   diazepam (VALIUM) tablet 5 mg    -I have reviewed the patients home medicines and have made adjustments as needed   Consultations Obtained: na   Cardiac Monitoring: The patient was maintained on a cardiac monitor.  I personally viewed and interpreted the cardiac monitored which showed an underlying rhythm of: NSR  Social Determinants of Health:  Diagnosis or treatment significantly limited by social determinants of health: former smoker   Reevaluation: After the interventions noted above, I reevaluated the patient and found that they have improved  Co morbidities that complicate the patient evaluation  Past Medical History:  Diagnosis  Date   Allergic rhinitis    Asthma    COPD (chronic obstructive pulmonary disease) (HCC)       Dispostion: Disposition decision including need for hospitalization was considered, and patient disposition pending at time of sign out.    Final Clinical Impression(s) / ED Diagnoses Final diagnoses:  Vertigo     This chart was dictated using voice recognition software.  Despite best efforts to proofread,  errors can occur which can change the documentation meaning.    Sloan Leiter, DO 12/25/22 [phone]

## 2022-12-23 NOTE — ED Notes (Signed)
Pt sts she had an episode of feeling generally weak earlier today; she did not lose consciousness; she had just returned from having an MRI Brain outpatient

## 2022-12-23 NOTE — ED Notes (Signed)
Patient transported to CT 

## 2022-12-24 ENCOUNTER — Ambulatory Visit (INDEPENDENT_AMBULATORY_CARE_PROVIDER_SITE_OTHER): Payer: Medicare Other

## 2022-12-24 DIAGNOSIS — J4550 Severe persistent asthma, uncomplicated: Secondary | ICD-10-CM

## 2023-01-07 ENCOUNTER — Ambulatory Visit (INDEPENDENT_AMBULATORY_CARE_PROVIDER_SITE_OTHER): Payer: Medicare Other

## 2023-01-07 DIAGNOSIS — J4550 Severe persistent asthma, uncomplicated: Secondary | ICD-10-CM

## 2023-01-16 ENCOUNTER — Telehealth: Payer: Self-pay | Admitting: Allergy & Immunology

## 2023-01-16 NOTE — Telephone Encounter (Signed)
Patient request a call back regarding a refill on  levocetirizine   Patient pharmacy is [facility] mail order. Please call back at earliest convenience [phone].

## 2023-01-16 NOTE — Telephone Encounter (Signed)
LVM for patient to call the office back about her questions about the levocetirzine.

## 2023-01-18 ENCOUNTER — Other Ambulatory Visit (HOSPITAL_COMMUNITY): Payer: Self-pay

## 2023-01-18 ENCOUNTER — Ambulatory Visit: Payer: Medicare Other

## 2023-01-18 ENCOUNTER — Encounter (HOSPITAL_COMMUNITY): Payer: Medicare Other

## 2023-01-21 ENCOUNTER — Ambulatory Visit (INDEPENDENT_AMBULATORY_CARE_PROVIDER_SITE_OTHER): Payer: Medicare Other

## 2023-01-21 DIAGNOSIS — J4550 Severe persistent asthma, uncomplicated: Secondary | ICD-10-CM

## 2023-01-22 ENCOUNTER — Ambulatory Visit: Payer: Medicare Other | Admitting: Internal Medicine

## 2023-01-23 ENCOUNTER — Telehealth: Payer: Self-pay | Admitting: *Deleted

## 2023-01-23 NOTE — Telephone Encounter (Signed)
Caller: pt.   Concern: Pt. Concern of new symptoms of syncopal episodes. Pt saw neuro who did a MRI of the head. Pt PCP concern with finding on MRI and new episode being related to vascular insuffiencey.     Description:  Pt has 3 syncopal episodes  within past week  Consulted: Corrie PA  Resolution: Appointment scheduled for first  MD available  with regard to Dr. Karin Lieu being out of office. Pt. Advised to go to ED if any stroke like symptoms occur.  Next Appt: Appointment scheduled for 01/29/23 @ 10:20am \

## 2023-01-24 ENCOUNTER — Other Ambulatory Visit: Payer: Self-pay | Admitting: *Deleted

## 2023-01-24 MED ORDER — LEVOCETIRIZINE DIHYDROCHLORIDE 5 MG PO TABS: 5.0000 mg | ORAL_TABLET | Freq: Every day | ORAL | 1 refills | Status: DC | PRN

## 2023-01-24 NOTE — Telephone Encounter (Signed)
Called and spoke with patient and she wanted the Levocetirizine sent to the mail order pharmacy. Prescription has been sent in. Patient verbalized understanding.

## 2023-01-28 NOTE — Progress Notes (Unsigned)
Patient name: Anne Rich MRN: [id number] DOB: 11-05-1950 Sex: female  REASON FOR CONSULT: Triage visit for syncopal event  HPI: Anne Rich is a 72 y.o. female, that presents for triage visit of syncopal episode.  Patient has been followed by Dr. Karin Lieu for a moderate 60 to 79% left ICA stenosis.  Had recent visit with Aggie Moats on 11/17/2022 and noted to have 2 months of dizziness.  This was followed by neurology suspect it is related to orthostasis and possible medications.  Most recently underwent an MRI brain on 12/23/2022 with no acute or reversible findings.  Recent carotid duplex on 11/17/2022 again showed a 60 to 79% left ICA stenosis and minimal 1 to 39% right ICA stenosis.  She had antegrade flow in both vertebral arteries.  Past Medical History:  Diagnosis Date   Allergic rhinitis    Asthma    COPD (chronic obstructive pulmonary disease) (HCC)     Past Surgical History:  Procedure Laterality Date   ABDOMINAL HYSTERECTOMY     APPENDECTOMY     CESAREAN SECTION      Family History  Problem Relation Age of Onset   Allergic rhinitis Neg Hx    Angioedema Neg Hx    Asthma Neg Hx    Eczema Neg Hx    Immunodeficiency Neg Hx    Urticaria Neg Hx     SOCIAL HISTORY: Social History   Socioeconomic History   Marital status: Married    Spouse name: Not on file   Number of children: Not on file   Years of education: Not on file   Highest education level: Not on file  Occupational History   Not on file  Tobacco Use   Smoking status: Former    Current packs/day: 0.00    Average packs/day: 0.5 packs/day for 15.0 years (7.5 ttl pk-yrs)    Types: Cigarettes    Start date: 06/12/1979    Quit date: 06/11/1994    Years since quitting: 28.6    Passive exposure: Never   Smokeless tobacco: Never  Vaping Use   Vaping status: Never Used  Substance and Sexual Activity   Alcohol use: No   Drug use: No   Sexual activity: Not on file  Other Topics Concern   Not on file  Social  History Narrative   Not on file   Social Determinants of Health   Financial Resource Strain: Not on file  Food Insecurity: Not on file  Transportation Needs: Not on file  Physical Activity: Not on file  Stress: Not on file  Social Connections: Not on file  Intimate Partner Violence: Not on file    Allergies  Allergen Reactions   Iodine Itching, Rash and Swelling    IV contrast    Iodine-131 Itching and Swelling    Pt states causes severe itching Pt states causes severe itching    Penicillins Hives and Swelling    Swelling of the throat   Pineapple Other (See Comments) and Swelling    Pt states causes her throat swelling Pt states causes her throat swelling    Shellfish Allergy Rash and Swelling    Had reaction to cardiac cath dye  Had seizure ,rash ,itch Oct. 2012 Had reaction to cardiac cath dye  Had seizure ,rash ,itch Oct. 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 54 g 3   arformoterol (BROVANA) 15 MCG/2ML NEBU Take 2 mLs (15 mcg total) by nebulization 2 (two) times daily. 120 mL 11   azithromycin (ZITHROMAX) 250 MG tablet TAKE 2 TABLETS ON THE FIRST DAY AND 1 TABLET DAILY FOR 4 MORE DAYS. WAIT 3 DAYS AND THEN REPEAT THE ENTIRE COURSE. 12 tablet 0   budesonide (PULMICORT) 0.5 MG/2ML nebulizer solution USE 1 VIAL  IN  NEBULIZER TWICE  DAILY - rinse mouth after treatment 360 mL 3   carbamazepine (TEGRETOL) 200 MG tablet Take 200-400 mg by mouth 2 (two) times daily. 200mg  tab in the morning and 400mg  at night      cyclobenzaprine (FLEXERIL) 5 MG tablet Take 5 mg by mouth daily.     diclofenac sodium (VOLTAREN) 1 % GEL Apply 2 g topically 4 (four) times daily as needed (knee pain).      EPINEPHrine (EPIPEN 2-PAK) 0.3 mg/0.3 mL IJ SOAJ injection Inject 0.3 mg into the muscle Once PRN. 1 each 2   estradiol (ESTRACE) 1 MG tablet Take 1 mg by mouth daily.  3   gabapentin (NEURONTIN) 600 MG tablet Take 600-1,200 mg by mouth 2 (two) times daily. 600mg  in the morning and 1200mg  at bedtime     ipratropium (ATROVENT) 0.02 % nebulizer solution Take 2.5 mLs (0.5 mg total) by nebulization every 4 (four) hours as needed for wheezing or shortness of breath. 75 mL 2   levalbuterol (XOPENEX) 0.63 MG/3ML nebulizer solution Take 3 mLs (0.63 mg total) by nebulization every 4 (four) hours as needed for wheezing or shortness of breath. 540 mL 1   levETIRAcetam (KEPPRA) 1000 MG tablet Take 1,000-2,000 mg by mouth 2 (two) times daily. 1000mg  in the morning and 2000mg  at night     levocetirizine (XYZAL) 5 MG tablet Take 1 tablet (5 mg total) by mouth daily as needed for allergies (for runny nose). 90 tablet 1   lisinopril (PRINIVIL,ZESTRIL) 10 MG tablet Take 20 mg by mouth daily.     LORazepam (ATIVAN) 1 MG tablet Take 1 mg by mouth 3 (three) times daily.     meclizine (ANTIVERT) 25 MG tablet Take 25 mg by mouth 2 (two) times daily as needed for dizziness or nausea.      Melatonin 10 MG TABS Take 10 mg by mouth daily.     mirtazapine (REMERON) 30 MG tablet Take 30 mg by mouth every evening.      montelukast (SINGULAIR) 10 MG tablet TAKE 1 TABLET BY MOUTH EVERYDAY AT BEDTIME 90 tablet 1   Multiple Vitamin (MULTIVITAMIN) tablet Take 1 tablet by mouth daily.      omeprazole (PRILOSEC) 40 MG capsule Take 40 mg by mouth 2 (two) times a day.     ondansetron (ZOFRAN) 4 MG tablet TAKE 1 TABLET (4 MG TOTAL) BY MOUTH 2 (TWO) TIMES DAILY AS NEEDED FOR NAUSEA. 20 tablet 1   oxybutynin (DITROPAN) 5 MG tablet Take by mouth.     OXYGEN Inhale 3 L  into the lungs at bedtime. 4 L when walking     predniSONE (DELTASONE) 10 MG tablet Take 10 mg by mouth daily with breakfast.  predniSONE (STERAPRED UNI-PAK 21 TAB) 10 MG (21) TBPK tablet Take by mouth daily. Take 6 tabs by mouth daily  for 2 days, then 5 tabs for 2 days, then 4 tabs for 2 days, then 3 tabs for 2 days, 2 tabs for 2 days, then 1 tab by mouth daily for 2 days 42 tablet 0   QUEtiapine (SEROQUEL) 25 MG tablet Take 25-50 mg by mouth at bedtime.     revefenacin (YUPELRI) 175 MCG/3ML nebulizer solution Inhale 3 mLs (175 mcg total) into the lungs daily. 90 mL 11   roflumilast (DALIRESP) 500 MCG TABS tablet Take 1 tablet (500 mcg total) by mouth daily. 90 tablet 3   TESSALON PERLES 100 MG capsule Take 1 capsule (100 mg total) by mouth 3 (three) times daily as needed for cough. 20 capsule 0   Current Facility-Administered Medications  Medication Dose Route Frequency Provider Last Rate Last Admin   dupilumab (DUPIXENT) prefilled syringe 600 mg  600 mg Subcutaneous Q14 Days Alfonse Spruce, MD   600 mg at 06/06/22 1610   omalizumab Geoffry Paradise) prefilled syringe 150 mg  150 mg Subcutaneous Q14 Days Alfonse Spruce, MD   150 mg at 01/21/23 1004    REVIEW OF SYSTEMS:  [X]  denotes positive finding, [ ]  denotes negative finding Cardiac  Comments:  Chest pain or chest pressure:    Shortness of breath upon exertion:    Short of breath when lying flat:    Irregular heart rhythm:        Vascular    Pain in calf, thigh, or hip brought on by ambulation:    Pain in feet at night that wakes you up from your sleep:     Blood clot in your veins:    Leg swelling:         Pulmonary    Oxygen at home:    Productive cough:     Wheezing:         Neurologic    Sudden weakness in arms or legs:     Sudden numbness in arms or legs:     Sudden onset of difficulty speaking or slurred speech:    Temporary loss of vision in one eye:     Problems with dizziness:         Gastrointestinal     Blood in stool:     Vomited blood:         Genitourinary    Burning when urinating:     Blood in urine:        Psychiatric    Major depression:         Hematologic    Bleeding problems:    Problems with blood clotting too easily:        Skin    Rashes or ulcers:        Constitutional    Fever or chills:      PHYSICAL EXAM: There were no vitals filed for this visit.  GENERAL: The patient is a well-nourished female, in no acute distress. The vital signs are documented above. CARDIAC: There is a regular rate and rhythm.  PULMONARY: No respiratory distress. ABDOMEN: Soft and non-tender. MUSCULOSKELETAL: There are no major deformities or cyanosis. NEUROLOGIC: No focal weakness or paresthesias are detected.  Neurologically intact. SKIN: There are no ulcers or rashes noted. PSYCHIATRIC: The patient has a normal affect.  DATA:   Carotid duplex on 11/17/2022 shows 1-39% right ICA stenosis and 60-79% left ICA stenosis  Assessment/Plan:  72 y.o. female, that presents for triage visit of syncopal episode.  Patient has been followed by Dr. Karin Lieu for a moderate 60 to 79% left ICA stenosis.  Most recently underwent an MRI brain on 12/23/2022 with no acute or reversible findings at [facility].  Recent carotid duplex on 11/17/2022 again showed a 60 to 79% left ICA stenosis and minimal 1 to 39% right ICA stenosis.  She had antegrade flow in both vertebral arteries.  I discussed that I do not think her dizziness is related to cerebral hypoperfusion.  She has a minimal right ICA stenosis with only moderate left ICA stenosis.  Discussed indication for carotid surgery is for stroke risk reduction and she does not meet criteria for asymptomatic carotid disease which would be >80%.  Will follow-up in 6 months with repeat carotid duplex.  Her MRI that she had done at [facility] showed no acute findings.  Antegrade flow in both vertebral arteries.  I provided reassurance.    Cephus Shelling,  MD [facility] Office: [phone]

## 2023-01-29 ENCOUNTER — Encounter: Payer: Self-pay | Admitting: Vascular Surgery

## 2023-01-29 ENCOUNTER — Ambulatory Visit: Payer: Medicare Other | Admitting: Vascular Surgery

## 2023-01-29 VITALS — BP 126/76 | HR 86 | Temp 97.5°F | Wt 157.0 lb

## 2023-01-29 DIAGNOSIS — I6523 Occlusion and stenosis of bilateral carotid arteries: Secondary | ICD-10-CM | POA: Diagnosis not present

## 2023-02-05 ENCOUNTER — Ambulatory Visit (INDEPENDENT_AMBULATORY_CARE_PROVIDER_SITE_OTHER): Payer: Medicare Other | Admitting: Family Medicine

## 2023-02-05 ENCOUNTER — Ambulatory Visit (INDEPENDENT_AMBULATORY_CARE_PROVIDER_SITE_OTHER): Payer: Medicare Other

## 2023-02-05 ENCOUNTER — Encounter: Payer: Self-pay | Admitting: Family Medicine

## 2023-02-05 VITALS — BP 118/66 | HR 94 | Temp 97.9°F | Resp 22

## 2023-02-05 DIAGNOSIS — H1013 Acute atopic conjunctivitis, bilateral: Secondary | ICD-10-CM

## 2023-02-05 DIAGNOSIS — J4489 Other specified chronic obstructive pulmonary disease: Secondary | ICD-10-CM | POA: Diagnosis not present

## 2023-02-05 DIAGNOSIS — K219 Gastro-esophageal reflux disease without esophagitis: Secondary | ICD-10-CM

## 2023-02-05 DIAGNOSIS — J3089 Other allergic rhinitis: Secondary | ICD-10-CM | POA: Diagnosis not present

## 2023-02-05 DIAGNOSIS — J4550 Severe persistent asthma, uncomplicated: Secondary | ICD-10-CM | POA: Diagnosis not present

## 2023-02-05 DIAGNOSIS — H1010 Acute atopic conjunctivitis, unspecified eye: Secondary | ICD-10-CM

## 2023-02-05 DIAGNOSIS — J4551 Severe persistent asthma with (acute) exacerbation: Secondary | ICD-10-CM | POA: Insufficient documentation

## 2023-02-05 MED ORDER — LEVOCETIRIZINE DIHYDROCHLORIDE 5 MG PO TABS: 5.0000 mg | ORAL_TABLET | Freq: Every day | ORAL | 1 refills | Status: DC | PRN

## 2023-02-05 MED ORDER — CROMOLYN SODIUM 4 % OP SOLN: 2.0000 [drp] | Freq: Four times a day (QID) | OPHTHALMIC | 5 refills | Status: AC | PRN

## 2023-02-05 NOTE — Telephone Encounter (Signed)
Patient would like a call back regarding medications Levalbuterol or if they should [place] Ipragropium. Please advise Call back number is [phone].

## 2023-02-05 NOTE — Patient Instructions (Addendum)
Asthma with COPD overlap:  - Lung function looked a bit better today.  - Continue with oxygen as needed.  - We are not going to make any changes today.  - Daily controller medication(s):       AM: Pulmicort (budesonide 0.5mg ) + Brovana (aformoterol) + Yupelri + Dalisrep + prednisone 10mg       PM: Pulmicort (budesonide 0.5mg ) + Brovana (aformoterol)       EVERY TWO WEEKS: Xolair 150mg  - Rescue medications: Xopenex (levalbuterol) + Atrovent (ipratropium) mixed together every 4-6 hours as needed via nebulizer - For current flare: Increase budesonide via nebulizer to 4 times a day for 1-2 weeks or until cough and wheeze free, then return to twice a day - Consider a DEXA scan - Call the clinic if your symptoms worsen or do not improve - Asthma control goals:  * Full participation in all desired activities (may need albuterol before activity) * Albuterol use two time or less a week on average (not counting use with activity) * Cough interfering with sleep two time or less a month * Oral steroids no more than once a year * No hospitalizations * Minimize medical procedures   Allergic rhinitis - Continue with montelukast 10mg  daily.  - Continue with levocetirizine 5mg  daily   Allergic conjunctivitis Begin cromolyn eye drops 2 drops in each eye up to 4 times a day as needed for red or itchy eyes.  Continue a lubricating drop as needed  Reflux Continue dietary and lifestyle modifications as listed below Continue omeprazole as previously prescribed  Call the clinic if this treatment plan is not working well for you  Follow up in 2 weeks or sooner if needed.   Please inform us of any Emergency Department visits, hospitalizations, or changes in symptoms. Call us before going to the ED for breathing or allergy symptoms since we might be able to fit you in for a sick visit. Feel free to contact us anytime with any questions, problems, or concerns.  It was a pleasure to see you again  today!   Lifestyle Changes for Controlling GERD When you have GERD, stomach acid feels as if it's backing up toward your mouth. Whether or not you take medication to control your GERD, your symptoms can often be improved with lifestyle changes.   Raise Your Head Reflux is more likely to strike when you're lying down flat, because stomach fluid can flow backward more easily. Raising the head of your bed 4-6 inches can help. To do this: Slide blocks or books under the legs at the head of your bed. Or, place a wedge under the mattress. Many foam stores can make a suitable wedge for you. The wedge should run from your waist to the top of your head. Don't just prop your head on several pillows. This increases pressure on your stomach. It can make GERD worse.  Watch Your Eating Habits Certain foods may increase the acid in your stomach or relax the lower esophageal sphincter, making GERD more likely. It's best to avoid the following: Coffee, tea, and carbonated drinks (with and without caffeine) Fatty, fried, or spicy food Mint, chocolate, onions, and tomatoes Any other foods that seem to irritate your stomach or cause you pain  Relieve the Pressure Eat smaller meals, even if you have to eat more often. Don't lie down right after you eat. Wait a few hours for your stomach to empty. Avoid tight belts and tight-fitting clothes. Lose excess weight.  Tobacco and Alcohol  Avoid smoking tobacco and drinking alcohol. They can make GERD symptoms worse.

## 2023-02-05 NOTE — Telephone Encounter (Signed)
Lm for pt to call us back about this °
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
## 2023-02-05 NOTE — Telephone Encounter (Signed)
Patient seen in clinic today, medications upodated

## 2023-02-07 ENCOUNTER — Telehealth: Payer: Self-pay

## 2023-02-07 ENCOUNTER — Ambulatory Visit: Payer: Medicare Other | Admitting: Family

## 2023-02-07 NOTE — Telephone Encounter (Signed)
Yes, thank you for relaying the message to [facility] Would recommend her going to the Emergency Room due to these symptoms.

## 2023-02-07 NOTE — Telephone Encounter (Signed)
Anne Rich calling saying her plan of care that she was given on Tuesday is not working. She said she is feeling dizzy and feels like her chest is being squeezed. After talking with Chrissie and Dr. Maurine Minister it would be in the patient's best interest to go to the Emergency room where if she needed a chest xray or EKG the ED could meet the patient's needs.

## 2023-02-08 ENCOUNTER — Telehealth: Payer: Self-pay | Admitting: Pulmonary Disease

## 2023-02-08 ENCOUNTER — Emergency Department (HOSPITAL_BASED_OUTPATIENT_CLINIC_OR_DEPARTMENT_OTHER)
Admission: EM | Admit: 2023-02-08 | Discharge: 2023-02-08 | Disposition: A | Discharge status: Home / Self Care | Payer: Medicare Other | Attending: Emergency Medicine | Admitting: Emergency Medicine

## 2023-02-08 ENCOUNTER — Emergency Department (HOSPITAL_BASED_OUTPATIENT_CLINIC_OR_DEPARTMENT_OTHER): Payer: Medicare Other

## 2023-02-08 ENCOUNTER — Encounter (HOSPITAL_BASED_OUTPATIENT_CLINIC_OR_DEPARTMENT_OTHER): Payer: Self-pay | Admitting: Emergency Medicine

## 2023-02-08 ENCOUNTER — Other Ambulatory Visit: Payer: Self-pay | Admitting: Allergy & Immunology

## 2023-02-08 ENCOUNTER — Other Ambulatory Visit: Payer: Self-pay

## 2023-02-08 DIAGNOSIS — D72829 Elevated white blood cell count, unspecified: Secondary | ICD-10-CM | POA: Insufficient documentation

## 2023-02-08 DIAGNOSIS — Z79899 Other long term (current) drug therapy: Secondary | ICD-10-CM | POA: Insufficient documentation

## 2023-02-08 DIAGNOSIS — Z7951 Long term (current) use of inhaled steroids: Secondary | ICD-10-CM | POA: Insufficient documentation

## 2023-02-08 DIAGNOSIS — Z1152 Encounter for screening for COVID-19: Secondary | ICD-10-CM | POA: Insufficient documentation

## 2023-02-08 DIAGNOSIS — R0602 Shortness of breath: Secondary | ICD-10-CM

## 2023-02-08 DIAGNOSIS — E871 Hypo-osmolality and hyponatremia: Secondary | ICD-10-CM | POA: Insufficient documentation

## 2023-02-08 DIAGNOSIS — Z87891 Personal history of nicotine dependence: Secondary | ICD-10-CM | POA: Diagnosis not present

## 2023-02-08 DIAGNOSIS — J441 Chronic obstructive pulmonary disease with (acute) exacerbation: Secondary | ICD-10-CM | POA: Diagnosis not present

## 2023-02-08 DIAGNOSIS — I2510 Atherosclerotic heart disease of native coronary artery without angina pectoris: Secondary | ICD-10-CM | POA: Insufficient documentation

## 2023-02-08 DIAGNOSIS — I10 Essential (primary) hypertension: Secondary | ICD-10-CM | POA: Diagnosis not present

## 2023-02-08 DIAGNOSIS — R0902 Hypoxemia: Secondary | ICD-10-CM | POA: Diagnosis not present

## 2023-02-08 LAB — CBC WITH DIFFERENTIAL/PLATELET
Abs Immature Granulocytes: 0.05 10*3/uL (ref 0.00–0.07)
Basophils Absolute: 0 10*3/uL (ref 0.0–0.1)
Basophils Relative: 0 %
Eosinophils Absolute: 0.1 10*3/uL (ref 0.0–0.5)
Eosinophils Relative: 0 %
HCT: 34.6 % — ABNORMAL LOW (ref 36.0–46.0)
Hemoglobin: 11.6 g/dL — ABNORMAL LOW (ref 12.0–15.0)
Immature Granulocytes: 0 %
Lymphocytes Relative: 9 %
Lymphs Abs: 1 10*3/uL (ref 0.7–4.0)
MCH: 30.7 pg (ref 26.0–34.0)
MCHC: 33.5 g/dL (ref 30.0–36.0)
MCV: 91.5 fL (ref 80.0–100.0)
Monocytes Absolute: 0.8 10*3/uL (ref 0.1–1.0)
Monocytes Relative: 7 %
Neutro Abs: 9.7 10*3/uL — ABNORMAL HIGH (ref 1.7–7.7)
Neutrophils Relative %: 84 %
Platelets: 359 10*3/uL (ref 150–400)
RBC: 3.78 MIL/uL — ABNORMAL LOW (ref 3.87–5.11)
RDW: 12.5 % (ref 11.5–15.5)
WBC: 11.6 10*3/uL — ABNORMAL HIGH (ref 4.0–10.5)
nRBC: 0 % (ref 0.0–0.2)

## 2023-02-08 LAB — BASIC METABOLIC PANEL
Anion gap: 9 (ref 5–15)
BUN: 9 mg/dL (ref 8–23)
CO2: 26 mmol/L (ref 22–32)
Calcium: 8.6 mg/dL — ABNORMAL LOW (ref 8.9–10.3)
Chloride: 96 mmol/L — ABNORMAL LOW (ref 98–111)
Creatinine, Ser: 0.68 mg/dL (ref 0.44–1.00)
GFR, Estimated: >60 mL/min (ref >60)
Glucose, Bld: 115 mg/dL — ABNORMAL HIGH (ref 70–99)
Potassium: 4.2 mmol/L (ref 3.5–5.1)
Sodium: 131 mmol/L — ABNORMAL LOW (ref 135–145)

## 2023-02-08 LAB — RESP PANEL BY RT-PCR (RSV, FLU A&B, COVID)  RVPGX2
Influenza A by PCR: NEGATIVE
Influenza B by PCR: NEGATIVE
Resp Syncytial Virus by PCR: NEGATIVE
SARS Coronavirus 2 by RT PCR: NEGATIVE

## 2023-02-08 LAB — TROPONIN I (HIGH SENSITIVITY): Troponin I (High Sensitivity): 3 ng/L (ref <18)

## 2023-02-08 MED ORDER — DOXYCYCLINE HYCLATE 100 MG PO CAPS: 100.0000 mg | ORAL_CAPSULE | Freq: Two times a day (BID) | ORAL | 0 refills | Status: DC

## 2023-02-08 MED ORDER — METHYLPREDNISOLONE SODIUM SUCC 125 MG IJ SOLR
125.0000 mg | Freq: Once | INTRAMUSCULAR | Status: AC
  Administered 2023-02-08: 125 mg via INTRAVENOUS
  Filled 2023-02-08: qty 2

## 2023-02-08 MED ORDER — SODIUM CHLORIDE 0.9 % IV SOLN
500.0000 mg | Freq: Once | INTRAVENOUS | Status: AC
  Administered 2023-02-08: 500 mg via INTRAVENOUS
  Filled 2023-02-08: qty 5

## 2023-02-08 MED ORDER — ALBUTEROL SULFATE (2.5 MG/3ML) 0.083% IN NEBU
2.5000 mg | INHALATION_SOLUTION | RESPIRATORY_TRACT | Status: AC
  Administered 2023-02-08: 2.5 mg via RESPIRATORY_TRACT
  Filled 2023-02-08: qty 3

## 2023-02-08 MED ORDER — PREDNISONE 10 MG (21) PO TBPK: ORAL_TABLET | Freq: Every day | ORAL | 0 refills | Status: DC

## 2023-02-08 MED ORDER — DIPHENHYDRAMINE HCL 25 MG PO CAPS
50.0000 mg | ORAL_CAPSULE | Freq: Once | ORAL | Status: AC
  Administered 2023-02-08: 50 mg via ORAL
  Filled 2023-02-08: qty 2

## 2023-02-08 MED ORDER — IPRATROPIUM-ALBUTEROL 0.5-2.5 (3) MG/3ML IN SOLN
3.0000 mL | Freq: Once | RESPIRATORY_TRACT | Status: AC
  Administered 2023-02-08: 3 mL via RESPIRATORY_TRACT
  Filled 2023-02-08: qty 3

## 2023-02-08 NOTE — Telephone Encounter (Signed)
Pt. Calling having breathing issues please advise we have no acute visits for today

## 2023-02-08 NOTE — Telephone Encounter (Signed)
Called patient in the morning at 8;30 am and it went to voice mail. I was able to connect to her now and she is currently in the emergency room getting evaluated with chest x ray whose results are pending.   She will get treated there for now and will call back if not getting better. Nothing further needed.

## 2023-02-08 NOTE — ED Notes (Signed)
   02/08/23 1007  Respiratory Assessment  $ RT Protocol Assessment  Yes  Assessment Type Pre-treatment  Respiratory Pattern Regular;Labored;Dyspnea at rest;Symmetrical;Shallow  Chest Assessment Chest expansion symmetrical  Bilateral Breath Sounds (S)  Diminished (LAst treatment ~7am)  Oxygen Therapy/Pulse Ox  O2 Therapy Room air  SpO2 (!) (S)  88 %   After ambulating from lobby to triage, SpO2 88% recovered to low 90s. Increased WOB noted. Patient states "I'm not going to tell you what it was at home"

## 2023-02-08 NOTE — ED Provider Notes (Addendum)
[facility] Provider Note   CSN: [id number] Arrival date & time: 02/08/23  4098     History  Chief Complaint  Patient presents with   Shortness of Breath    Anne Rich is a 72 y.o. female.   Shortness of Breath   72 year old female presents emergency department with complaints of shortness of breath, cough.  Patient states that she has been experiencing symptoms for the past several days.  States she had a recent appointment with asthma and allergy on 02/05/2023 and was given prednisone for treatment of acute asthma exacerbation but has not been taking prednisone.  States that symptoms have worsened.  States she is on 3 L as needed of oxygen at night.  States that she was on 3 L throughout the day when she was "more active "but due to inactivity, has not needed it during the day.  States has been compliant with her at home COPD/asthma medications.  Denies any fever, chills, productivity of cough, chest pain, abdominal pain, nausea, vomiting, urinary symptoms, change in bowel habits.  Past medical history significant for asthma/COPD on budesonide/formoterol/Yupelri/Daliresp/Xolair injections/Xolair/Atrovent, chronic respiratory failure with hypoxia on 3 L of oxygen at home, CVA, CAD, hypertension, GERD, hypercholesterolemia, lung bullae  Home Medications Prior to Admission medications   Medication Sig Start Date End Date Taking? Authorizing Provider  doxycycline (VIBRAMYCIN) 100 MG capsule Take 1 capsule (100 mg total) by mouth 2 (two) times daily. 02/08/23  Yes Sherian Maroon A, PA  predniSONE (STERAPRED UNI-PAK 21 TAB) 10 MG (21) TBPK tablet Take by mouth daily. Take 6 tabs by mouth daily  for 2 days, then 5 tabs for 2 days, then 4 tabs for 2 days, then 3 tabs for 2 days, 2 tabs for 2 days, then 1 tab by mouth daily for 2 days 02/08/23  Yes Sherian Maroon A, PA  albuterol (VENTOLIN HFA) 108 (90 Base) MCG/ACT inhaler Inhale 2 puffs into the  lungs every 4 (four) hours as needed for wheezing or shortness of breath. 10/30/22   Alfonse Spruce, MD  arformoterol (BROVANA) 15 MCG/2ML NEBU Take 2 mLs (15 mcg total) by nebulization 2 (two) times daily. 06/14/22   Mannam, Colbert Coyer, MD  azithromycin (ZITHROMAX) 250 MG tablet TAKE 2 TABLETS ON THE FIRST DAY AND 1 TABLET DAILY FOR 4 MORE DAYS. WAIT 3 DAYS AND THEN REPEAT THE ENTIRE COURSE. 12/03/22   Alfonse Spruce, MD  budesonide (PULMICORT) 0.5 MG/2ML nebulizer solution USE 1 VIAL  IN  NEBULIZER TWICE  DAILY - rinse mouth after treatment 10/30/22   Alfonse Spruce, MD  carbamazepine (TEGRETOL) 200 MG tablet Take 200-400 mg by mouth 2 (two) times daily. 200mg  tab in the morning and 400mg  at night    [provider]  cromolyn (OPTICROM) 4 % ophthalmic solution Place 2 drops into both eyes 4 (four) times daily as needed. 02/05/23   Hetty Blend, FNP  cyclobenzaprine (FLEXERIL) 5 MG tablet Take 5 mg by mouth daily.    [provider]  diclofenac sodium (VOLTAREN) 1 % GEL Apply 2 g topically 4 (four) times daily as needed (knee pain).  11/10/18   [provider]  EPINEPHrine (EPIPEN 2-PAK) 0.3 mg/0.3 mL IJ SOAJ injection Inject 0.3 mg into the muscle Once PRN. 03/14/20   Alfonse Spruce, MD  estradiol (ESTRACE) 1 MG tablet Take 1 mg by mouth daily. 11/19/17   [provider]  gabapentin (NEURONTIN) 600 MG tablet Take 600-1,200 mg  by mouth 2 (two) times daily. 600mg  in the morning and 1200mg  at bedtime 11/25/18   [provider]  ipratropium (ATROVENT) 0.02 % nebulizer solution Take 2.5 mLs (0.5 mg total) by nebulization every 4 (four) hours as needed for wheezing or shortness of breath. 04/13/22   Alfonse Spruce, MD  levalbuterol Pauline Aus) 0.63 MG/3ML nebulizer solution Take 3 mLs (0.63 mg total) by nebulization every 4 (four) hours as needed for wheezing or shortness of breath. 05/18/22   Alfonse Spruce, MD  levETIRAcetam (KEPPRA)  1000 MG tablet Take 1,000-2,000 mg by mouth 2 (two) times daily. 1000mg  in the morning and 2000mg  at night    [provider]  levocetirizine (XYZAL) 5 MG tablet Take 1 tablet (5 mg total) by mouth daily as needed for allergies (for runny nose). 02/05/23   Hetty Blend, FNP  lisinopril (PRINIVIL,ZESTRIL) 10 MG tablet Take 20 mg by mouth daily. 12/10/17   [provider]  LORazepam (ATIVAN) 1 MG tablet Take 1 mg by mouth 3 (three) times daily. 03/08/22   [provider]  meclizine (ANTIVERT) 25 MG tablet Take 25 mg by mouth 2 (two) times daily as needed for dizziness or nausea.  01/27/13   [provider]  Melatonin 10 MG TABS Take 10 mg by mouth daily.    [provider]  mirtazapine (REMERON) 30 MG tablet Take 30 mg by mouth every evening.  12/06/18   [provider]  montelukast (SINGULAIR) 10 MG tablet TAKE 1 TABLET BY MOUTH EVERYDAY AT BEDTIME 10/30/22   Alfonse Spruce, MD  Multiple Vitamin (MULTIVITAMIN) tablet Take 1 tablet by mouth daily.     [provider]  omeprazole (PRILOSEC) 40 MG capsule Take 40 mg by mouth 2 (two) times a day.    [provider]  ondansetron (ZOFRAN) 4 MG tablet TAKE 1 TABLET (4 MG TOTAL) BY MOUTH 2 (TWO) TIMES DAILY AS NEEDED FOR NAUSEA. 12/26/21   Mannam, Colbert Coyer, MD  oxybutynin (DITROPAN) 5 MG tablet Take by mouth. 02/16/21   [provider]  OXYGEN Inhale 3 L into the lungs at bedtime. 4 L when walking    [provider]  predniSONE (DELTASONE) 10 MG tablet Take 10 mg by mouth daily with breakfast.    [provider]  QUEtiapine (SEROQUEL) 25 MG tablet Take 25-50 mg by mouth at bedtime. 09/05/21   [provider]  revefenacin (YUPELRI) 175 MCG/3ML nebulizer solution Inhale 3 mLs (175 mcg total) into the lungs daily. 06/14/22   Mannam, Colbert Coyer, MD  roflumilast (DALIRESP) 500 MCG TABS tablet Take 1 tablet (500 mcg total) by mouth daily. 03/19/22   Mannam, Colbert Coyer, MD   TESSALON PERLES 100 MG capsule Take 1 capsule (100 mg total) by mouth 3 (three) times daily as needed for cough. 07/25/22   Alfonse Spruce, MD      Allergies    Iodine, Iodine-131, Penicillins, Pineapple, Shellfish allergy, Molds & smuts, Pravastatin, Shellfish-derived products, Xanax [alprazolam], and Iodinated contrast media    Review of Systems   Review of Systems  Respiratory:  Positive for shortness of breath.   All other systems reviewed and are negative.   Physical Exam Updated Vital Signs BP 139/72 (BP Location: Right Arm)   Pulse 84   Temp 98 F (36.7 C) (Oral)   Resp 18   SpO2 96%  Physical Exam Vitals and nursing note reviewed.  Constitutional:      General: She is not in acute distress.  Appearance: She is well-developed.  HENT:     Head: Normocephalic and atraumatic.  Eyes:     Conjunctiva/sclera: Conjunctivae normal.  Cardiovascular:     Rate and Rhythm: Normal rate and regular rhythm.     Heart sounds: No murmur heard. Pulmonary:     Effort: Pulmonary effort is normal. No respiratory distress.     Breath sounds: Decreased breath sounds present.     Comments: Use decreased breath sounds.  Possible mild wheeze auscultated bilateral lung fields. Abdominal:     Palpations: Abdomen is soft.     Tenderness: There is no abdominal tenderness.  Musculoskeletal:        General: No swelling.     Cervical back: Neck supple.     Right lower leg: No edema.     Left lower leg: No edema.  Skin:    General: Skin is warm and dry.     Capillary Refill: Capillary refill takes less than 2 seconds.  Neurological:     Mental Status: She is alert.  Psychiatric:        Mood and Affect: Mood normal.     ED Results / Procedures / Treatments   Labs (all labs ordered are listed, but only abnormal results are displayed) Labs Reviewed  CBC WITH DIFFERENTIAL/PLATELET - Abnormal; Notable for the following components:      Result Value   WBC 11.6 (*)    RBC 3.78  (*)    Hemoglobin 11.6 (*)    HCT 34.6 (*)    Neutro Abs 9.7 (*)    All other components within normal limits  BASIC METABOLIC PANEL - Abnormal; Notable for the following components:   Sodium 131 (*)    Chloride 96 (*)    Glucose, Bld 115 (*)    Calcium 8.6 (*)    All other components within normal limits  RESP PANEL BY RT-PCR (RSV, FLU A&B, COVID)  RVPGX2  TROPONIN I (HIGH SENSITIVITY)    EKG EKG Interpretation Date/Time:  Friday February 08 2023 10:07:17 EDT Ventricular Rate:  80 PR Interval:  133 QRS Duration:  82 QT Interval:  366 QTC Calculation: 423 R Axis:   72  Text Interpretation: Sinus rhythm No significant change since last tracing Confirmed by Alvira Monday ([id number]) on 02/08/2023 10:58:18 AM  Radiology DG Chest Port 1 View  Result Date: 02/08/2023 CLINICAL DATA:  Shortness of breath EXAM: PORTABLE CHEST 1 VIEW COMPARISON:  12/23/2022.  Older exams as well FINDINGS: Hyperinflation. No consolidation, pneumothorax or effusion. No edema. Normal cardiopericardial silhouette. Overlapping cardiac leads. Old bilateral rib fractures. IMPRESSION: No acute cardiopulmonary disease.  Hyperinflation.  Chronic change Electronically Signed   By: Karen Kays M.D.   On: 02/08/2023 10:29    Procedures Procedures    Medications Ordered in ED Medications  albuterol (PROVENTIL) (2.5 MG/3ML) 0.083% nebulizer solution 2.5 mg (0 mg Nebulization Stopped 02/08/23 1306)  ipratropium-albuterol (DUONEB) 0.5-2.5 (3) MG/3ML nebulizer solution 3 mL (3 mLs Nebulization Given 02/08/23 1030)  methylPREDNISolone sodium succinate (SOLU-MEDROL) 125 mg/2 mL injection 125 mg (125 mg Intravenous Given 02/08/23 1106)  azithromycin (ZITHROMAX) 500 mg in sodium chloride 0.9 % 250 mL IVPB (0 mg Intravenous Stopped 02/08/23 1136)  diphenhydrAMINE (BENADRYL) capsule 50 mg (50 mg Oral Given 02/08/23 1155)    ED Course/ Medical Decision Making/ A&P Clinical Course as of 02/08/23 1518  Fri Feb 08, 2023  1156  While being given azithromycin via IV, began to develop raised wheal-like rash on arm where IV was  placed.  IV subsequently stopped and patient given Benadryl.  Rash said to be itchy. [CR]    Clinical Course User Index [CR] Peter Garter, PA                                 Medical Decision Making Amount and/or Complexity of Data Reviewed Labs: ordered. Radiology: ordered.  Risk Prescription drug management.   This patient presents to the ED for concern of shortness of breath, this involves an extensive number of treatment options, and is a complaint that carries with it a high risk of complications and morbidity.  The differential diagnosis includes The causes for shortness of breath include but are not limited to Cardiac (AHF, pericardial effusion and tamponade, arrhythmias, ischemia, etc) Respiratory (COPD, asthma, pneumonia, pneumothorax, primary pulmonary hypertension, PE/VQ mismatch) Hematological (anemia)  Co morbidities that complicate the patient evaluation  See HPI   Additional history obtained:  Additional history obtained from EMR External records from outside source obtained and reviewed including hospital records   Lab Tests:  I Ordered, and personally interpreted labs.  The pertinent results include: Leukocytosis of 11.6.  Evidence of anemia with hemoglobin 11.60 which is normocytic and near patient's baseline.  Hyponatremia, hypochloremia and hypocalcemia of 131, 96 and 8.6 respectively.  No renal dysfunction.  Respiratory viral panel negative.  Troponin of 3   Imaging Studies ordered:  I ordered imaging studies including chest ray I independently visualized and interpreted imaging which showed no acute cardiopulmonary abnormalities I agree with the radiologist interpretation   Cardiac Monitoring: / EKG:  The patient was maintained on a cardiac monitor.  I personally viewed and interpreted the cardiac monitored which showed an underlying rhythm of:  Sinus rhythm with out evidence of acute ischemic change from prior EKG performed   Consultations Obtained:  I requested consultation with attending physician Dr. Dalene Seltzer who is in agreement treatment plan going forward  Problem List / ED Course / Critical interventions / Medication management  Shortness of breath, asthma/COPD exacerbation, hypoxia I ordered medication including DuoNeb, albuterol, Solu-Medrol, azithromycin Reevaluation of the patient after these medicines showed that the patient improved I have reviewed the patients home medicines and have made adjustments as needed   Social Determinants of Health:  Former cigarette use.  Denies illicit drug use   Test / Admission - Considered:  Shortness of breath, asthma/COPD exacerbation, hypoxia Vitals signs significant for initial hypoxia on room air with oxygen saturations of 88% of which returned within normal range with administration of 2 L nasal cannula.. Otherwise within normal range and stable throughout visit. Laboratory/imaging studies significant for: See above 72 year old female presents emergency department with complaints of shortness of breath for the past several days.  Patient with history of COPD/asthma with chronic respiratory failure with hypoxia.  Per patient, states that she no longer wears oxygen during the day but with most recent pulmonology note stating that she is supposed to be on 2 L of oxygen throughout the day and at night.  Unaware of if patient took herself off of oxygen during the day due to no clear documentation able to be found of oxygen being DC'd during the day.  Regardless, patient presented with oxygen saturations in the mid to upper 80s on room air with complaints of dyspnea.  Noted adequate response with 2 L via nasal cannula with oxygen saturations in the mid to upper 90s.  On exam initially, patient with  faint wheeze but with diffuse decreased breath sounds bilaterally.  Patient given  breathing treatments, corticosteroid and did note significant improvement of breathing.  Workup today overall reassuring.  Patient without evidence of significant anemia.  Initial troponin of 3 without evidence of acute ischemic change on EKG; given duration of patient's symptoms with no acute change in the past 3 to 5 hours from initial troponin and no accompanying chest pain, second troponin deemed unnecessary.  Low suspicion for ACS.  Patient without evidence of pneumonia, infiltration or other abnormality on chest x-ray.  Patient without risk factors for DVT/PE without chest pain, tachycardia and with subjective significant improvement with the breathing treatments.  Lower suspicion for PE with still of concern.  Upon reevaluation, discussion was had with patient regarding obtaining further imaging via CT angio imaging for PE rule out but patient declined given improvement of symptoms subjectively as well as with known contrast allergy.  Discussion was had with patient regarding admission due to unsure of whether or not hypoxia is new or if this is more chronic given the patient reports being taken off oxygen during the day.  More favorable hypoxia being more chronic.  Patient repetitively declined.  Will trial prednisone taper as well as continue use of COPD/asthma medications, regular use of at home oxygen on 2 L and close follow-up with pulmonology in the outpatient setting for reevaluation.  Strict return precautions discussed at length with patient.  Treatment plan discussed at length with patient and she acknowledged understanding was agreeable to said plan.  Patient overall well-appearing, afebrile in no acute respiratory distress upon discharge.        Final Clinical Impression(s) / ED Diagnoses Final diagnoses:  SOB (shortness of breath)  Hypoxia  COPD exacerbation (HCC)    Rx / DC Orders ED Discharge Orders          Ordered    predniSONE (STERAPRED UNI-PAK 21 TAB) 10 MG (21) TBPK  tablet  Daily        02/08/23 1237    doxycycline (VIBRAMYCIN) 100 MG capsule  2 times daily        02/08/23 1237                Peter Garter, [place] 02/08/23 1518    Alvira Monday, MD 02/08/23 2230

## 2023-02-08 NOTE — Telephone Encounter (Signed)
PT. Calling back a 2X said someone called her

## 2023-02-08 NOTE — Discharge Instructions (Addendum)
As discussed, symptoms likely secondary to COPD/asthma exacerbation.  Will place you on prednisone as well as antibiotics for the next several days.  Recommend using a pulse ox at home and wearing oxygen throughout the day and monitoring your oxygen levels.  If you develop chest pain, worsening shortness of breath, you are always welcome back to the emergency department.  Otherwise, recommend follow-up with primary care/pulmonology in the outpatient setting for reevaluation.  Please do not hesitate to return to emergency department if there are worrisome signs and symptoms we discussed become apparent.

## 2023-02-08 NOTE — ED Triage Notes (Signed)
Presents to ED via POV  with shortness of breath , Hx asthma . Hypoxic during triage .  Denies chest pain . Reports no relief with inhaler and her breathing treatments .

## 2023-02-08 NOTE — Telephone Encounter (Signed)
Called the pt and there was no answer- LMTCB    

## 2023-02-08 NOTE — ED Notes (Signed)
Pt called out and reported that her arm was itching. Noted right arm noted to be red and itching. This nurse turned off antibiotic that was infusing. EDP made aware.

## 2023-02-15 ENCOUNTER — Other Ambulatory Visit: Payer: Self-pay | Admitting: Allergy & Immunology

## 2023-02-19 ENCOUNTER — Encounter: Payer: Self-pay | Admitting: Family Medicine

## 2023-02-19 ENCOUNTER — Ambulatory Visit: Payer: Medicare Other

## 2023-02-19 ENCOUNTER — Other Ambulatory Visit: Payer: Self-pay

## 2023-02-19 ENCOUNTER — Ambulatory Visit (INDEPENDENT_AMBULATORY_CARE_PROVIDER_SITE_OTHER): Payer: Medicare Other | Admitting: Family Medicine

## 2023-02-19 VITALS — BP 114/72 | HR 86 | Temp 98.6°F | Resp 20

## 2023-02-19 DIAGNOSIS — J4489 Other specified chronic obstructive pulmonary disease: Secondary | ICD-10-CM

## 2023-02-19 DIAGNOSIS — H1013 Acute atopic conjunctivitis, bilateral: Secondary | ICD-10-CM

## 2023-02-19 DIAGNOSIS — J3089 Other allergic rhinitis: Secondary | ICD-10-CM

## 2023-02-19 DIAGNOSIS — Z7952 Long term (current) use of systemic steroids: Secondary | ICD-10-CM

## 2023-02-19 DIAGNOSIS — H1010 Acute atopic conjunctivitis, unspecified eye: Secondary | ICD-10-CM

## 2023-02-19 DIAGNOSIS — J4550 Severe persistent asthma, uncomplicated: Secondary | ICD-10-CM

## 2023-02-19 DIAGNOSIS — R42 Dizziness and giddiness: Secondary | ICD-10-CM | POA: Diagnosis not present

## 2023-02-19 DIAGNOSIS — K219 Gastro-esophageal reflux disease without esophagitis: Secondary | ICD-10-CM

## 2023-02-19 NOTE — Progress Notes (Signed)
[address] [place] [address] Dept: [phone]  FOLLOW UP NOTE  Patient ID: Anne Rich, female    DOB: 22-May-1951  Age: 72 y.o. MRN: [id number] Date of Office Visit: 02/19/2023  Assessment  Chief Complaint: Follow-up (Patient c/o being dizzy asking about meclazine, states no change in medication treatment is the same)  HPI Anne Rich is a 72 year old female who presents to the clinic for follow-up visit.  She was last seen in this clinic on 02/05/2023 by Thermon Leyland, FNP, for evaluation of asthma with acute exacerbation requiring prednisone, nighttime oxygen use, allergic rhinitis, allergic conjunctivitis, and reflux.  In the interim, she went to the emergency department on 02/08/2023 for evaluation of worsening shortness of breath, hypoxia, and COPD exacerbation.  At today's visit, she reports that her asthma has been more well controlled since she received a steroid and an antibiotic at the ED. She reports occasional shortness of breath and occasional cough producing clear mucus. She continues a morning regimen consisting of budesonide, aformoterol, Yupelri, Daliresp, and prednisone 10 mg.  She continues an evening routine consisting of budesonide and aformoterol.  She continues to receive Xolair injections once every 2 weeks with no large or local reactions.  She reports a significant decrease in her symptoms of asthma while continuing on Xolair injections.  She does report that she has been suffering from intermittent episodes of vertigo over the last several years.  She reports that she began to experience dizziness beginning about 1 week ago.  She denies new medications. She denies nasal congestion, ear pain , change in hearing, or discharge from either ear. She continues to see her neurology specialist for evaluation of recent seizure activity in addition to new episodes of falling. She is requesting meclizine at today's visit. I suggested moving forward with physical therapy at this  time to increase core strength and balance.  Patient is refusing to use a walking assistive device.  Allergic rhinitis is reported as moderately well controlled with occasional symptoms including clear rhinorrhea, nasal congestion, and sneezing.  She continues Xyzal infrequently and is not currently using any nasal sprays including nasal steroid or saline nasal rinses.  She reports that Xyzal does not provide relief of symptoms. She reports she is not able to tolerate any nasal preparations of any type.  Reflux is reported as moderately well-controlled with infrequent heartburn as the main symptom.  She continues omeprazole 40 mg twice a day with relief of symptoms.  Her current medications are listed in the chart.   CLINICAL DATA:  Shortness of breath  EXAM: 02/08/2023 PORTABLE CHEST 1 VIEW  COMPARISON:  12/23/2022.  Older exams as well  FINDINGS:  Hyperinflation. No consolidation, pneumothorax or effusion. No  edema. Normal cardiopericardial silhouette. Overlapping cardiac  leads. Old bilateral rib fractures.  IMPRESSION:  No acute cardiopulmonary disease.  Hyperinflation.  Chronic change   Electronically Signed    By: Karen Kays M.D.    On: 02/08/2023 10:29   Drug Allergies:  Allergies  Allergen Reactions   Iodine Itching, Rash and Swelling    IV contrast    Iodine-131 Itching and Swelling    Pt states causes severe itching Pt states causes severe itching    Penicillins Hives and Swelling    Swelling of the throat   Pineapple Other (See Comments) and Swelling    Pt states causes her throat swelling Pt states causes her throat swelling    Shellfish Allergy Rash and Swelling    Had  reaction to cardiac cath dye  Had seizure ,rash ,itch Oct. 2012 Had reaction to cardiac cath dye  Had seizure ,rash ,itch Oct. 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  Constitutional:      Appearance: Normal appearance.  HENT:     Head: Normocephalic and atraumatic.     Right Ear: Tympanic membrane normal.     Left Ear: Tympanic membrane normal.     Nose:     Comments: Bilateral nares slightly erythematous with thin clear nasal drainage noted.  Pharynx normal.  Ears normal.  Eyes normal.    Mouth/Throat:     Pharynx: Oropharynx is clear.  Eyes:     Conjunctiva/sclera: Conjunctivae normal.  Cardiovascular:     Rate and Rhythm: Normal rate and regular rhythm.     Heart sounds: Normal heart sounds. No murmur heard. Pulmonary:     Effort: Pulmonary effort is normal.     Breath sounds: Normal breath sounds.     Comments: Lungs clear to auscultation Musculoskeletal:        General: Normal range of motion.     Cervical back: Normal range of motion and neck supple.  Skin:    General: Skin is warm and dry.  Neurological:     Mental Status: She is alert and oriented to person, place, and time.  Psychiatric:        Mood and Affect: Mood normal.        Behavior: Behavior normal.        Thought Content: Thought content normal.        Judgment: Judgment normal.     Assessment and Plan: 1. Severe persistent asthma without complication   2. Asthma-COPD overlap syndrome   3. Long-term corticosteroid use   4. Dizziness   5. Gastroesophageal reflux disease without esophagitis   6. Seasonal allergic conjunctivitis   7. Perennial allergic rhinitis     Patient Instructions  Dizziness - Begin physical therapy to strengthen your core muscles and sharpen balance - Medication review from [facility]: Stop levocetirizine at  this time.  - Follow up with your primary care provider and neurology specialist for evaluation and treatment of dizziness  Asthma with COPD overlap:  - Continue with oxygen as needed.  - We are not going to make any changes today.  - Daily controller medication(s):       AM: Pulmicort (budesonide 0.5mg ) + Brovana (aformoterol) + Yupelri + Dalisrep + prednisone 10mg       PM: Pulmicort (budesonide 0.5mg ) + Brovana (aformoterol)       EVERY TWO WEEKS: Xolair 150mg  - Rescue medications: Xopenex (levalbuterol) + Atrovent (ipratropium) mixed together every 4-6 hours as needed via nebulizer - For current flare: Increase budesonide via nebulizer to 4 times a day for 1-2 weeks or until cough and wheeze free, then return to twice a day - Consider a DEXA scan - Call the clinic if your symptoms worsen  or do not improve - Asthma control goals:  * Full participation in all desired activities (may need albuterol before activity) * Albuterol use two time or less a week on average (not counting use with activity) * Cough interfering with sleep two time or less a month * Oral steroids no more than once a year * No hospitalizations * Minimize medical procedures   Allergic rhinitis - Continue with montelukast 10mg  daily.  - Consider saline nasal rinses as needed for nasal symptoms. Use this before any medicated nasal sprays for best result - Stop Xyzal at this time as this is not controlling your symptoms and may contribute to sleepiness or possible dizziness   Allergic conjunctivitis Continue cromolyn eye drops 2 drops in each eye up to 4 times a day as needed for red or itchy eyes.  Continue a lubricating drop as needed  Reflux Continue dietary and lifestyle modifications as listed below Continue omeprazole as previously prescribed  Call the clinic if this treatment plan is not working well for you  Follow up in 1 month or sooner if needed.   Return in about 4 weeks (around 03/19/2023),  or if symptoms worsen or fail to improve.    Thank you for the opportunity to care for this patient.  Please do not hesitate to contact me with questions.  Thermon Leyland, FNP [facility]

## 2023-02-19 NOTE — Patient Instructions (Addendum)
Dizziness - Begin physical therapy to strengthen your core muscles and sharpen balance - Medication review from [facility]: Stop levocetirizine at this time.  - Follow up with your primary care provider and neurology specialist for evaluation and treatment of dizziness  Asthma with COPD overlap:  - Continue with oxygen as needed.  - We are not going to make any changes today.  - Daily controller medication(s):       AM: Pulmicort (budesonide 0.5mg ) + Brovana (aformoterol) + Yupelri + Dalisrep + prednisone 10mg       PM: Pulmicort (budesonide 0.5mg ) + Brovana (aformoterol)       EVERY TWO WEEKS: Xolair 150mg  - Rescue medications: Xopenex (levalbuterol) + Atrovent (ipratropium) mixed together every 4-6 hours as needed via nebulizer - For current flare: Increase budesonide via nebulizer to 4 times a day for 1-2 weeks or until cough and wheeze free, then return to twice a day - Consider a DEXA scan - Call the clinic if your symptoms worsen or do not improve - Asthma control goals:  * Full participation in all desired activities (may need albuterol before activity) * Albuterol use two time or less a week on average (not counting use with activity) * Cough interfering with sleep two time or less a month * Oral steroids no more than once a year * No hospitalizations * Minimize medical procedures   Allergic rhinitis - Continue with montelukast 10mg  daily.  - Consider saline nasal rinses as needed for nasal symptoms. Use this before any medicated nasal sprays for best result - Stop Xyzal at this time as this is not controlling your symptoms and may contribute to sleepiness or possible dizziness   Allergic conjunctivitis Continue cromolyn eye drops 2 drops in each eye up to 4 times a day as needed for red or itchy eyes.  Continue a lubricating drop as needed  Reflux Continue dietary and lifestyle modifications as listed below Continue omeprazole as previously  prescribed  Call the clinic if this treatment plan is not working well for you  Follow up in 1 month or sooner if needed.   Please inform us of any Emergency Department visits, hospitalizations, or changes in symptoms. Call us before going to the ED for breathing or allergy symptoms since we might be able to fit you in for a sick visit. Feel free to contact us anytime with any questions, problems, or concerns.  It was a pleasure to see you again today!   Lifestyle Changes for Controlling GERD When you have GERD, stomach acid feels as if it's backing up toward your mouth. Whether or not you take medication to control your GERD, your symptoms can often be improved with lifestyle changes.   Raise Your Head Reflux is more likely to strike when you're lying down flat, because stomach fluid can flow backward more easily. Raising the head of your bed 4-6 inches can help. To do this: Slide blocks or books under the legs at the head of your bed. Or, place a wedge under the mattress. Many foam stores can make a suitable wedge for you. The wedge should run from your waist to the top of your head. Don't just prop your head on several pillows. This increases pressure on your stomach. It can make GERD worse.  Watch Your Eating Habits Certain foods may increase the acid in your stomach or relax the lower esophageal sphincter, making GERD more likely. It's best to avoid the following: Coffee, tea, and carbonated  drinks (with and without caffeine) Fatty, fried, or spicy food Mint, chocolate, onions, and tomatoes Any other foods that seem to irritate your stomach or cause you pain  Relieve the Pressure Eat smaller meals, even if you have to eat more often. Don't lie down right after you eat. Wait a few hours for your stomach to empty. Avoid tight belts and tight-fitting clothes. Lose excess weight.  Tobacco and Alcohol Avoid smoking tobacco and drinking alcohol. They can make GERD symptoms  worse.

## 2023-02-20 ENCOUNTER — Telehealth: Payer: Self-pay

## 2023-02-20 NOTE — Telephone Encounter (Signed)
Pt called stating that she had gone to her allergist yesterday and the nurse thought she was "having trouble" with her words.  Reviewed pt's chart, returned call for clarification, two identifiers used. Pt spoke no differently than the last time she called into office on phone. She was understandable, albeit had some intermittent hesitancy with speech, but no changes since last phone conversation. She denied any other stroke-like symptoms. She had called her neurologist to f/u on seizures and syncope episodes. Instructed her to call 911 if she had any stroke-like symptoms and call this office if anything worsened. Confirmed understanding.

## 2023-02-22 ENCOUNTER — Observation Stay (HOSPITAL_COMMUNITY): Payer: Medicare Other

## 2023-02-22 ENCOUNTER — Other Ambulatory Visit: Payer: Self-pay

## 2023-02-22 ENCOUNTER — Observation Stay (HOSPITAL_BASED_OUTPATIENT_CLINIC_OR_DEPARTMENT_OTHER)
Admission: EM | Admit: 2023-02-22 | Discharge: 2023-02-23 | Disposition: A | Discharge status: Home Health | Payer: Medicare Other | Attending: Family Medicine | Admitting: Family Medicine

## 2023-02-22 ENCOUNTER — Other Ambulatory Visit: Payer: Self-pay | Admitting: Pulmonary Disease

## 2023-02-22 ENCOUNTER — Emergency Department (HOSPITAL_BASED_OUTPATIENT_CLINIC_OR_DEPARTMENT_OTHER): Payer: Medicare Other

## 2023-02-22 DIAGNOSIS — F320 Major depressive disorder, single episode, mild: Secondary | ICD-10-CM | POA: Diagnosis present

## 2023-02-22 DIAGNOSIS — Z7951 Long term (current) use of inhaled steroids: Secondary | ICD-10-CM | POA: Insufficient documentation

## 2023-02-22 DIAGNOSIS — J45909 Unspecified asthma, uncomplicated: Secondary | ICD-10-CM | POA: Insufficient documentation

## 2023-02-22 DIAGNOSIS — I6930 Unspecified sequelae of cerebral infarction: Secondary | ICD-10-CM

## 2023-02-22 DIAGNOSIS — J9611 Chronic respiratory failure with hypoxia: Secondary | ICD-10-CM | POA: Diagnosis not present

## 2023-02-22 DIAGNOSIS — I10 Essential (primary) hypertension: Secondary | ICD-10-CM | POA: Diagnosis not present

## 2023-02-22 DIAGNOSIS — J449 Chronic obstructive pulmonary disease, unspecified: Secondary | ICD-10-CM | POA: Insufficient documentation

## 2023-02-22 DIAGNOSIS — R569 Unspecified convulsions: Secondary | ICD-10-CM | POA: Diagnosis not present

## 2023-02-22 DIAGNOSIS — I6522 Occlusion and stenosis of left carotid artery: Secondary | ICD-10-CM | POA: Diagnosis not present

## 2023-02-22 DIAGNOSIS — R471 Dysarthria and anarthria: Secondary | ICD-10-CM | POA: Diagnosis not present

## 2023-02-22 DIAGNOSIS — R42 Dizziness and giddiness: Principal | ICD-10-CM | POA: Insufficient documentation

## 2023-02-22 DIAGNOSIS — K219 Gastro-esophageal reflux disease without esophagitis: Secondary | ICD-10-CM | POA: Insufficient documentation

## 2023-02-22 DIAGNOSIS — Z8673 Personal history of transient ischemic attack (TIA), and cerebral infarction without residual deficits: Secondary | ICD-10-CM | POA: Insufficient documentation

## 2023-02-22 DIAGNOSIS — J4489 Other specified chronic obstructive pulmonary disease: Secondary | ICD-10-CM | POA: Diagnosis present

## 2023-02-22 DIAGNOSIS — G9009 Other idiopathic peripheral autonomic neuropathy: Secondary | ICD-10-CM | POA: Insufficient documentation

## 2023-02-22 DIAGNOSIS — R479 Unspecified speech disturbances: Principal | ICD-10-CM

## 2023-02-22 DIAGNOSIS — E871 Hypo-osmolality and hyponatremia: Secondary | ICD-10-CM | POA: Insufficient documentation

## 2023-02-22 DIAGNOSIS — E7800 Pure hypercholesterolemia, unspecified: Secondary | ICD-10-CM | POA: Insufficient documentation

## 2023-02-22 DIAGNOSIS — G609 Hereditary and idiopathic neuropathy, unspecified: Secondary | ICD-10-CM | POA: Diagnosis present

## 2023-02-22 DIAGNOSIS — Z87891 Personal history of nicotine dependence: Secondary | ICD-10-CM | POA: Diagnosis not present

## 2023-02-22 LAB — COMPREHENSIVE METABOLIC PANEL
ALT: 16 U/L (ref 0–44)
AST: 19 U/L (ref 15–41)
Albumin: 3.3 g/dL — ABNORMAL LOW (ref 3.5–5.0)
Alkaline Phosphatase: 62 U/L (ref 38–126)
Anion gap: 8 (ref 5–15)
BUN: 11 mg/dL (ref 8–23)
CO2: 27 mmol/L (ref 22–32)
Calcium: 8.3 mg/dL — ABNORMAL LOW (ref 8.9–10.3)
Chloride: 94 mmol/L — ABNORMAL LOW (ref 98–111)
Creatinine, Ser: 0.79 mg/dL (ref 0.44–1.00)
GFR, Estimated: >60 mL/min (ref >60)
Glucose, Bld: 125 mg/dL — ABNORMAL HIGH (ref 70–99)
Potassium: 4.7 mmol/L (ref 3.5–5.1)
Sodium: 129 mmol/L — ABNORMAL LOW (ref 135–145)
Total Bilirubin: 0.3 mg/dL (ref 0.3–1.2)
Total Protein: 6.3 g/dL — ABNORMAL LOW (ref 6.5–8.1)

## 2023-02-22 LAB — URINALYSIS, ROUTINE W REFLEX MICROSCOPIC
Bilirubin Urine: NEGATIVE
Glucose, UA: NEGATIVE mg/dL
Hgb urine dipstick: NEGATIVE
Ketones, ur: NEGATIVE mg/dL
Leukocytes,Ua: NEGATIVE
Nitrite: NEGATIVE
Protein, ur: NEGATIVE mg/dL
Specific Gravity, Urine: 1.015 (ref 1.005–1.030)
pH: 8.5 — ABNORMAL HIGH (ref 5.0–8.0)

## 2023-02-22 LAB — CBC
HCT: 35.8 % — ABNORMAL LOW (ref 36.0–46.0)
Hemoglobin: 11.8 g/dL — ABNORMAL LOW (ref 12.0–15.0)
MCH: 30.1 pg (ref 26.0–34.0)
MCHC: 33 g/dL (ref 30.0–36.0)
MCV: 91.3 fL (ref 80.0–100.0)
Platelets: 402 10*3/uL — ABNORMAL HIGH (ref 150–400)
RBC: 3.92 MIL/uL (ref 3.87–5.11)
RDW: 12.5 % (ref 11.5–15.5)
WBC: 11.2 10*3/uL — ABNORMAL HIGH (ref 4.0–10.5)
nRBC: 0 % (ref 0.0–0.2)

## 2023-02-22 LAB — CBC WITH DIFFERENTIAL/PLATELET
Abs Immature Granulocytes: 0.04 10*3/uL (ref 0.00–0.07)
Basophils Absolute: 0 10*3/uL (ref 0.0–0.1)
Basophils Relative: 0 %
Eosinophils Absolute: 0 10*3/uL (ref 0.0–0.5)
Eosinophils Relative: 0 %
HCT: 35.8 % — ABNORMAL LOW (ref 36.0–46.0)
Hemoglobin: 12 g/dL (ref 12.0–15.0)
Immature Granulocytes: 0 %
Lymphocytes Relative: 12 %
Lymphs Abs: 1.3 10*3/uL (ref 0.7–4.0)
MCH: 31.3 pg (ref 26.0–34.0)
MCHC: 33.5 g/dL (ref 30.0–36.0)
MCV: 93.5 fL (ref 80.0–100.0)
Monocytes Absolute: 0.7 10*3/uL (ref 0.1–1.0)
Monocytes Relative: 6 %
Neutro Abs: 8.6 10*3/uL — ABNORMAL HIGH (ref 1.7–7.7)
Neutrophils Relative %: 82 %
Platelets: 395 10*3/uL (ref 150–400)
RBC: 3.83 MIL/uL — ABNORMAL LOW (ref 3.87–5.11)
RDW: 12.6 % (ref 11.5–15.5)
WBC: 10.6 10*3/uL — ABNORMAL HIGH (ref 4.0–10.5)
nRBC: 0 % (ref 0.0–0.2)

## 2023-02-22 LAB — OSMOLALITY: Osmolality: 281 mosm/kg (ref 275–295)

## 2023-02-22 LAB — CREATININE, SERUM
Creatinine, Ser: 0.7 mg/dL (ref 0.44–1.00)
GFR, Estimated: >60 mL/min (ref >60)

## 2023-02-22 MED ORDER — ONDANSETRON 4 MG PO TBDP
4.0000 mg | ORAL_TABLET | Freq: Three times a day (TID) | ORAL | Status: DC | PRN
  Administered 2023-02-22: 4 mg via ORAL
  Filled 2023-02-22: qty 1

## 2023-02-22 MED ORDER — CARBAMAZEPINE 200 MG PO TABS
400.0000 mg | ORAL_TABLET | Freq: Every day | ORAL | Status: DC
  Filled 2023-02-22: qty 2

## 2023-02-22 MED ORDER — ENOXAPARIN SODIUM 40 MG/0.4ML IJ SOSY: 40.0000 mg | PREFILLED_SYRINGE | INTRAMUSCULAR | Status: DC

## 2023-02-22 MED ORDER — ROFLUMILAST 500 MCG PO TABS
500.0000 ug | ORAL_TABLET | Freq: Every day | ORAL | Status: DC
  Administered 2023-02-23: 500 ug via ORAL
  Filled 2023-02-22: qty 1

## 2023-02-22 MED ORDER — CROMOLYN SODIUM 4 % OP SOLN: 2.0000 [drp] | Freq: Four times a day (QID) | OPHTHALMIC | Status: DC | PRN

## 2023-02-22 MED ORDER — GABAPENTIN 400 MG PO CAPS
1200.0000 mg | ORAL_CAPSULE | Freq: Every day | ORAL | Status: DC
  Administered 2023-02-22: 1200 mg via ORAL
  Filled 2023-02-22: qty 3

## 2023-02-22 MED ORDER — ACETAMINOPHEN 650 MG RE SUPP: 650.0000 mg | Freq: Four times a day (QID) | RECTAL | Status: DC | PRN

## 2023-02-22 MED ORDER — MECLIZINE HCL 25 MG PO TABS: 25.0000 mg | ORAL_TABLET | Freq: Two times a day (BID) | ORAL | Status: DC | PRN

## 2023-02-22 MED ORDER — LORAZEPAM 2 MG/ML IJ SOLN
0.5000 mg | Freq: Once | INTRAMUSCULAR | Status: AC
  Administered 2023-02-22: 0.5 mg via INTRAVENOUS
  Filled 2023-02-22: qty 1

## 2023-02-22 MED ORDER — BUDESONIDE 0.5 MG/2ML IN SUSP
0.5000 mg | Freq: Two times a day (BID) | RESPIRATORY_TRACT | Status: DC
  Administered 2023-02-23: 0.5 mg via RESPIRATORY_TRACT
  Filled 2023-02-22: qty 2

## 2023-02-22 MED ORDER — ARFORMOTEROL TARTRATE 15 MCG/2ML IN NEBU
15.0000 ug | INHALATION_SOLUTION | Freq: Two times a day (BID) | RESPIRATORY_TRACT | Status: DC
  Administered 2023-02-23: 15 ug via RESPIRATORY_TRACT
  Filled 2023-02-22: qty 2

## 2023-02-22 MED ORDER — THIAMINE HCL 100 MG/ML IJ SOLN
100.0000 mg | Freq: Every day | INTRAMUSCULAR | Status: DC
  Administered 2023-02-23: 100 mg via INTRAVENOUS
  Filled 2023-02-22: qty 2

## 2023-02-22 MED ORDER — SODIUM CHLORIDE 0.9 % IV SOLN
2000.0000 mg | Freq: Once | INTRAVENOUS | Status: DC
  Filled 2023-02-22: qty 20

## 2023-02-22 MED ORDER — MONTELUKAST SODIUM 10 MG PO TABS
10.0000 mg | ORAL_TABLET | Freq: Every day | ORAL | Status: DC
  Administered 2023-02-22: 10 mg via ORAL
  Filled 2023-02-22: qty 1

## 2023-02-22 MED ORDER — LEVETIRACETAM 500 MG PO TABS: 1000.0000 mg | ORAL_TABLET | Freq: Two times a day (BID) | ORAL | Status: DC

## 2023-02-22 MED ORDER — PANTOPRAZOLE SODIUM 40 MG PO TBEC
40.0000 mg | DELAYED_RELEASE_TABLET | Freq: Every day | ORAL | Status: DC
  Administered 2023-02-23: 40 mg via ORAL
  Filled 2023-02-22: qty 1

## 2023-02-22 MED ORDER — CARBAMAZEPINE 100 MG/5ML PO SUSP
400.0000 mg | ORAL | Status: AC
  Administered 2023-02-22: 400 mg via ORAL
  Filled 2023-02-22: qty 20

## 2023-02-22 MED ORDER — ONDANSETRON HCL 4 MG PO TABS: 4.0000 mg | ORAL_TABLET | Freq: Three times a day (TID) | ORAL | Status: DC | PRN

## 2023-02-22 MED ORDER — LEVETIRACETAM 750 MG PO TABS
2000.0000 mg | ORAL_TABLET | Freq: Every day | ORAL | Status: DC
  Administered 2023-02-22: 2000 mg via ORAL
  Filled 2023-02-22: qty 1

## 2023-02-22 MED ORDER — MIRTAZAPINE 15 MG PO TABS
30.0000 mg | ORAL_TABLET | Freq: Every evening | ORAL | Status: DC
  Administered 2023-02-22: 30 mg via ORAL
  Filled 2023-02-22: qty 2

## 2023-02-22 MED ORDER — GABAPENTIN 600 MG PO TABS: 600.0000 mg | ORAL_TABLET | Freq: Two times a day (BID) | ORAL | Status: DC

## 2023-02-22 MED ORDER — IPRATROPIUM BROMIDE 0.02 % IN SOLN: 0.5000 mg | RESPIRATORY_TRACT | Status: DC | PRN

## 2023-02-22 MED ORDER — LORAZEPAM 1 MG PO TABS
1.0000 mg | ORAL_TABLET | Freq: Three times a day (TID) | ORAL | Status: DC
  Administered 2023-02-22 – 2023-02-23 (×2): 1 mg via ORAL
  Filled 2023-02-22 (×2): qty 1

## 2023-02-22 MED ORDER — SODIUM CHLORIDE 0.9% FLUSH
3.0000 mL | Freq: Two times a day (BID) | INTRAVENOUS | Status: DC
  Administered 2023-02-22 – 2023-02-23 (×2): 3 mL via INTRAVENOUS

## 2023-02-22 MED ORDER — GABAPENTIN 300 MG PO CAPS
600.0000 mg | ORAL_CAPSULE | Freq: Every day | ORAL | Status: DC
  Administered 2023-02-23: 600 mg via ORAL
  Filled 2023-02-22: qty 2

## 2023-02-22 MED ORDER — REVEFENACIN 175 MCG/3ML IN SOLN
175.0000 ug | Freq: Every day | RESPIRATORY_TRACT | Status: DC
  Administered 2023-02-23: 175 ug via RESPIRATORY_TRACT
  Filled 2023-02-22: qty 3

## 2023-02-22 MED ORDER — LACTATED RINGERS IV BOLUS
1000.0000 mL | Freq: Once | INTRAVENOUS | Status: AC
  Administered 2023-02-22: 1000 mL via INTRAVENOUS

## 2023-02-22 MED ORDER — CARBAMAZEPINE 200 MG PO TABS
200.0000 mg | ORAL_TABLET | Freq: Every day | ORAL | Status: DC
  Administered 2023-02-23: 200 mg via ORAL
  Filled 2023-02-22: qty 1

## 2023-02-22 MED ORDER — PREDNISONE 5 MG PO TABS
10.0000 mg | ORAL_TABLET | Freq: Every day | ORAL | Status: DC
  Administered 2023-02-23: 10 mg via ORAL
  Filled 2023-02-22: qty 2

## 2023-02-22 MED ORDER — POLYETHYLENE GLYCOL 3350 17 G PO PACK: 17.0000 g | PACK | Freq: Every day | ORAL | Status: DC | PRN

## 2023-02-22 MED ORDER — ACETAMINOPHEN 325 MG PO TABS
650.0000 mg | ORAL_TABLET | Freq: Four times a day (QID) | ORAL | Status: DC | PRN
  Administered 2023-02-22: 650 mg via ORAL
  Filled 2023-02-22: qty 2

## 2023-02-22 MED ORDER — CARBAMAZEPINE 200 MG PO TABS: 200.0000 mg | ORAL_TABLET | Freq: Two times a day (BID) | ORAL | Status: DC

## 2023-02-22 MED ORDER — ALBUTEROL SULFATE (2.5 MG/3ML) 0.083% IN NEBU: 3.0000 mL | INHALATION_SOLUTION | RESPIRATORY_TRACT | Status: DC | PRN

## 2023-02-22 MED ORDER — LEVETIRACETAM 500 MG PO TABS
1000.0000 mg | ORAL_TABLET | Freq: Every day | ORAL | Status: DC
  Administered 2023-02-23: 1000 mg via ORAL
  Filled 2023-02-22: qty 2

## 2023-02-22 NOTE — ED Triage Notes (Addendum)
Patient presents to ED via POV from home. Here with dizziness and gait abnormality x 6 month. Patient has been seen by PCP and neurologist. Reports slurred speech is new as of 4 days ago.

## 2023-02-22 NOTE — Assessment & Plan Note (Addendum)
Patient already on tapering dose of ativan.  I think this may be contributing to Patient's current presentation.  Patient has had dizziness for about a month that she describes as vertigo.  MRI brain is pending.  I will also get physical therapy evaluation to get a safe ambulation plan for the patient.  Please note CT findings of the head above which I feel at this time are nonspecific.  I will also check a B12 level and give empiric treatment with thiamine.  This has been complicated by at least 4 falls as per patient over the last 1 month.  Per report given to me, patient has passed swallow eval, will get regular diet

## 2023-02-22 NOTE — H&P (Addendum)
History and Physical    Patient: Anne Rich VWU:[id number] DOB: 01/14/1951 DOA: 02/22/2023 DOS: the patient was seen and examined on 02/22/2023 PCP: Doreen Salvage, PA-C  Patient coming from:  Sent in by neurologist from home  Chief Complaint:  Chief Complaint  Patient presents with   Dizziness   HPI: Anne Rich is a 72 y.o. female with medical history significant of polio that was diagnosed when patient was 58 months old and patient reports some longstanding lower extremity weakness and some respiratory weakness since then.  Patient also has asthma that has been very difficult to control.  Patient reports that she was in her usual state of health till approximately a month ago when she started having sensation of vertigo on and off with no precipitating or relieving factor and there have been about 4 falls of patient basically falling backwards straight to the back and back of her head due to her vertigo.  Patient reports no new weakness of arm or legs or any reduced sensation or paresthesias.  Patient was evaluated by her allergist approximately 4 days ago who noticed that the patient was having change in the nature of her speech.  Patient's allergist was seeing her after several weeks time.  And also family has not noticed any change in her speech nor has the patient.  The allergist advised that the patient be evaluated by neurology.  The neurologist advised the patient over the phone to come to the ER as it would take a long amount of time as an outpatient to get this worked up.  Patient reports having no bladder or bowel changes, reports no weakness no neck trauma no back pain.  Patient therefore came to one of our associated ERs on the advice of the neurologist and is directly admitted to the [facility].  Patient reports otherwise no complaints at this time.  Was hungry when initially admitted to the hospital has had some diet since then.  Patient is accompanied by her husband at the  bedside Review of Systems: As mentioned in the history of present illness. All other systems reviewed and are negative. Past Medical History:  Diagnosis Date   Allergic rhinitis    Asthma    COPD (chronic obstructive pulmonary disease) (HCC)    Past Surgical History:  Procedure Laterality Date   ABDOMINAL HYSTERECTOMY     APPENDECTOMY     CESAREAN SECTION     Social History:  reports that she quit smoking about 28 years ago. Her smoking use included cigarettes. She started smoking about 43 years ago. She has a 7.5 pack-year smoking history. She has never been exposed to tobacco smoke. She has never used smokeless tobacco. She reports that she does not drink alcohol and does not use drugs.  Allergies  Allergen Reactions   Iodine Itching, Rash and Swelling    IV contrast    Iodine-131 Itching and Swelling    Pt states causes severe itching Pt states causes severe itching    Penicillins Hives and Swelling    Swelling of the throat   Pineapple Other (See Comments) and Swelling    Pt states causes her throat swelling Pt states causes her throat swelling    Shellfish Allergy Rash and Swelling    Had reaction to cardiac cath dye  Had seizure ,rash ,itch Oct. 2012 Had reaction to cardiac cath dye  Had seizure ,rash ,itch Oct. 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 Authorizing Provider  albuterol (PROVENTIL) (2.5 MG/3ML) 0.083% nebulizer solution USE 1 VIAL IN NEBULIZER EVERY 4 TO 6 HOURS  AS NEEDED FOR COUGH AND/OR WHEEZE.  Generic:  Ventolin 02/15/23   Alfonse Spruce, MD  albuterol (VENTOLIN HFA) 108 (90 Base) MCG/ACT inhaler Inhale 2 puffs into the lungs every 4 (four) hours as needed for wheezing or shortness of breath. 10/30/22   Alfonse Spruce, MD  arformoterol (BROVANA) 15 MCG/2ML NEBU Take 2 mLs (15 mcg total) by nebulization 2 (two) times daily. 06/14/22   Mannam, Colbert Coyer, MD  azithromycin (ZITHROMAX) 250 MG tablet TAKE 2 TABLETS ON THE FIRST DAY AND 1 TABLET DAILY FOR 4 MORE DAYS. WAIT 3 DAYS AND THEN REPEAT THE ENTIRE COURSE. 12/03/22   Alfonse Spruce, MD  budesonide (PULMICORT) 0.5 MG/2ML nebulizer solution USE 1 VIAL  IN  NEBULIZER TWICE  DAILY - rinse mouth after treatment 10/30/22   Alfonse Spruce, MD  carbamazepine (TEGRETOL) 200 MG tablet Take 200-400 mg by mouth 2 (two) times daily. 200mg  tab in the morning and 400mg  at night    [provider]  cromolyn (OPTICROM) 4 % ophthalmic solution Place 2 drops into both eyes 4 (four) times daily as needed. 02/05/23   Hetty Blend, FNP  cyclobenzaprine (FLEXERIL) 5 MG tablet Take 5 mg by mouth daily.    [provider]  diclofenac sodium (VOLTAREN) 1 % GEL Apply 2 g topically 4 (four) times daily as needed (knee pain).  11/10/18   [provider]  doxycycline (VIBRAMYCIN) 100 MG capsule Take 1 capsule (100 mg total) by mouth 2 (two) times daily. 02/08/23   Peter Garter, PA  EPINEPHrine (EPIPEN 2-PAK) 0.3 mg/0.3 mL IJ SOAJ injection Inject 0.3 mg into the muscle Once PRN. 03/14/20   Alfonse Spruce, MD  estradiol (ESTRACE) 1 MG tablet Take 1 mg by mouth daily. 11/19/17   [provider]  gabapentin (NEURONTIN) 600 MG tablet Take 600-1,200 mg by mouth 2 (two) times daily. 600mg  in the morning and 1200mg  at bedtime 11/25/18   [provider]  ipratropium (ATROVENT) 0.02 % nebulizer solution Take 2.5 mLs (0.5 mg total) by nebulization every 4 (four) hours as needed  for wheezing or shortness of breath. 04/13/22   Alfonse Spruce, MD  levalbuterol Pauline Aus) 0.63 MG/3ML nebulizer solution Take 3 mLs (0.63 mg total) by nebulization every 4 (four) hours as needed for wheezing or shortness of breath. 05/18/22   Alfonse Spruce, MD  levETIRAcetam (KEPPRA) 1000 MG tablet Take 1,000-2,000 mg by mouth 2 (two) times daily. 1000mg  in the morning and 2000mg  at night    [provider]  levocetirizine (XYZAL) 5 MG tablet Take 1 tablet (5 mg total) by mouth daily as needed for allergies (for runny nose). 02/05/23   Hetty Blend, FNP  lisinopril (PRINIVIL,ZESTRIL) 10 MG tablet Take 20 mg by  mouth daily. 12/10/17   [provider]  LORazepam (ATIVAN) 1 MG tablet Take 1 mg by mouth 3 (three) times daily. 03/08/22   [provider]  meclizine (ANTIVERT) 25 MG tablet Take 25 mg by mouth 2 (two) times daily as needed for dizziness or nausea.  01/27/13   [provider]  Melatonin 10 MG TABS Take 10 mg by mouth daily.    [provider]  mirtazapine (REMERON) 30 MG tablet Take 30 mg by mouth every evening.  12/06/18   [provider]  montelukast (SINGULAIR) 10 MG tablet TAKE 1 TABLET BY MOUTH EVERYDAY AT BEDTIME 02/08/23   Ambs, Norvel Richards, FNP  Multiple Vitamin (MULTIVITAMIN) tablet Take 1 tablet by mouth daily.     [provider]  omeprazole (PRILOSEC) 40 MG capsule Take 40 mg by mouth 2 (two) times a day.    [provider]  ondansetron (ZOFRAN) 4 MG tablet TAKE 1 TABLET (4 MG TOTAL) BY MOUTH 2 (TWO) TIMES DAILY AS NEEDED FOR NAUSEA. 12/26/21   Mannam, Colbert Coyer, MD  oxybutynin (DITROPAN) 5 MG tablet Take by mouth. 02/16/21   [provider]  OXYGEN Inhale 3 L into the lungs at bedtime. 4 L when walking    [provider]  predniSONE (DELTASONE) 10 MG tablet Take 10 mg by mouth daily with breakfast.    [provider]  predniSONE (STERAPRED UNI-PAK 21 TAB) 10 MG (21) TBPK tablet Take by  mouth daily. Take 6 tabs by mouth daily  for 2 days, then 5 tabs for 2 days, then 4 tabs for 2 days, then 3 tabs for 2 days, 2 tabs for 2 days, then 1 tab by mouth daily for 2 days 02/08/23   Sherian Maroon A, PA  QUEtiapine (SEROQUEL) 25 MG tablet Take 25-50 mg by mouth at bedtime. 09/05/21   [provider]  revefenacin (YUPELRI) 175 MCG/3ML nebulizer solution Inhale 3 mLs (175 mcg total) into the lungs daily. 06/14/22   Mannam, Colbert Coyer, MD  roflumilast (DALIRESP) 500 MCG TABS tablet Take 1 tablet (500 mcg total) by mouth daily. 03/19/22   Mannam, Colbert Coyer, MD  TESSALON PERLES 100 MG capsule Take 1 capsule (100 mg total) by mouth 3 (three) times daily as needed for cough. 07/25/22   Alfonse Spruce, MD    Physical Exam: Vitals:   02/22/23 1642 02/22/23 1645 02/22/23 1840 02/22/23 1945  BP:  (!) 146/69 (!) 145/74 (!) 161/80  Pulse:  74 82 88  Resp:  15  18  Temp: (!) 97.5 F (36.4 C)  98.3 F (36.8 C) 98.3 F (36.8 C)  TempSrc: Oral  Oral Oral  SpO2:  94% 95% 96%  Weight:      Height:       General: Patient appears to be in no distress.  Accompanied by her husband.  Gives a coherent account of her symptoms.  Orientation seems to be good Respiratory exam: Bilateral intravesicular Cardiovascular exam S1-S2 normal Abdomen all quadrants soft nontender Extremities warm without edema Neurologic exam: Symmetric facies, bilateral upper and lower extremity strength 5/5.  Soft touch sensation preserved.  I felt that there was some dysmetria on left upper extremity on finger-to-nose testing. Data Reviewed:  Labs on Admission:  Results for orders placed or performed during the hospital encounter of 02/22/23 (from the past 24 hour(s))  Urinalysis, Routine w reflex microscopic -Urine, Clean Catch     Status: Abnormal   Collection Time: 02/22/23 12:40 PM  Result Value Ref  Range   Color, Urine YELLOW YELLOW   APPearance CLEAR CLEAR   Specific Gravity, Urine 1.015 1.005 - 1.030   pH  8.5 (H) 5.0 - 8.0   Glucose, UA NEGATIVE NEGATIVE mg/dL   Hgb urine dipstick NEGATIVE NEGATIVE   Bilirubin Urine NEGATIVE NEGATIVE   Ketones, ur NEGATIVE NEGATIVE mg/dL   Protein, ur NEGATIVE NEGATIVE mg/dL   Nitrite NEGATIVE NEGATIVE   Leukocytes,Ua NEGATIVE NEGATIVE  Comprehensive metabolic panel     Status: Abnormal   Collection Time: 02/22/23 12:40 PM  Result Value Ref Range   Sodium 129 (L) 135 - 145 mmol/L   Potassium 4.7 3.5 - 5.1 mmol/L   Chloride 94 (L) 98 - 111 mmol/L   CO2 27 22 - 32 mmol/L   Glucose, Bld 125 (H) 70 - 99 mg/dL   BUN 11 8 - 23 mg/dL   Creatinine, Ser 1.61 0.44 - 1.00 mg/dL   Calcium 8.3 (L) 8.9 - 10.3 mg/dL   Total Protein 6.3 (L) 6.5 - 8.1 g/dL   Albumin 3.3 (L) 3.5 - 5.0 g/dL   AST 19 15 - 41 U/L   ALT 16 0 - 44 U/L   Alkaline Phosphatase 62 38 - 126 U/L   Total Bilirubin 0.3 0.3 - 1.2 mg/dL   GFR, Estimated >09 >60 mL/min   Anion gap 8 5 - 15  CBC with Differential     Status: Abnormal   Collection Time: 02/22/23 12:40 PM  Result Value Ref Range   WBC 10.6 (H) 4.0 - 10.5 K/uL   RBC 3.83 (L) 3.87 - 5.11 MIL/uL   Hemoglobin 12.0 12.0 - 15.0 g/dL   HCT 45.4 (L) 09.8 - 11.9 %   MCV 93.5 80.0 - 100.0 fL   MCH 31.3 26.0 - 34.0 pg   MCHC 33.5 30.0 - 36.0 g/dL   RDW 14.7 82.9 - 56.2 %   Platelets 395 150 - 400 K/uL   nRBC 0.0 0.0 - 0.2 %   Neutrophils Relative % 82 %   Neutro Abs 8.6 (H) 1.7 - 7.7 K/uL   Lymphocytes Relative 12 %   Lymphs Abs 1.3 0.7 - 4.0 K/uL   Monocytes Relative 6 %   Monocytes Absolute 0.7 0.1 - 1.0 K/uL   Eosinophils Relative 0 %   Eosinophils Absolute 0.0 0.0 - 0.5 K/uL   Basophils Relative 0 %   Basophils Absolute 0.0 0.0 - 0.1 K/uL   Immature Granulocytes 0 %   Abs Immature Granulocytes 0.04 0.00 - 0.07 K/uL  CBC     Status: Abnormal   Collection Time: 02/22/23  7:54 PM  Result Value Ref Range   WBC 11.2 (H) 4.0 - 10.5 K/uL   RBC 3.92 3.87 - 5.11 MIL/uL   Hemoglobin 11.8 (L) 12.0 - 15.0 g/dL   HCT 13.0 (L) 86.5 -  46.0 %   MCV 91.3 80.0 - 100.0 fL   MCH 30.1 26.0 - 34.0 pg   MCHC 33.0 30.0 - 36.0 g/dL   RDW 78.4 69.6 - 29.5 %   Platelets 402 (H) 150 - 400 K/uL   nRBC 0.0 0.0 - 0.2 %  Creatinine, serum     Status: None   Collection Time: 02/22/23  7:54 PM  Result Value Ref Range   Creatinine, Ser 0.70 0.44 - 1.00 mg/dL   GFR, Estimated >28 >41 mL/min   Basic Metabolic Panel: Recent Labs  Lab 02/22/23 1240 02/22/23 1954  NA 129*  --   K 4.7  --  CL 94*  --   CO2 27  --   GLUCOSE 125*  --   BUN 11  --   CREATININE 0.79 0.70  CALCIUM 8.3*  --    Liver Function Tests: Recent Labs  Lab 02/22/23 1240  AST 19  ALT 16  ALKPHOS 62  BILITOT 0.3  PROT 6.3*  ALBUMIN 3.3*   No results for input(s): "LIPASE", "AMYLASE" in the last 168 hours. No results for input(s): "AMMONIA" in the last 168 hours. CBC: Recent Labs  Lab 02/22/23 1240 02/22/23 1954  WBC 10.6* 11.2*  NEUTROABS 8.6*  --   HGB 12.0 11.8*  HCT 35.8* 35.8*  MCV 93.5 91.3  PLT 395 402*   Cardiac Enzymes: No results for input(s): "CKTOTAL", "CKMB", "CKMBINDEX", "TROPONINIHS" in the last 168 hours.  BNP (last 3 results) No results for input(s): "PROBNP" in the last 8760 hours. CBG: No results for input(s): "GLUCAP" in the last 168 hours.  Radiological Exams on Admission:  CT Head Wo Contrast  Result Date: 02/22/2023 CLINICAL DATA:  Neuro deficit, acute, stroke suspected EXAM: CT HEAD WITHOUT CONTRAST TECHNIQUE: Contiguous axial images were obtained from the base of the skull through the vertex without intravenous contrast. RADIATION DOSE REDUCTION: This exam was performed according to the departmental dose-optimization program which includes automated exposure control, adjustment of the mA and/or kV according to patient size and/or use of iterative reconstruction technique. COMPARISON:  Brain MR 12/31/22, CT head 02/27/22 FINDINGS: Brain: No hemorrhage. No hydrocephalus. No extra-axial fluid collection. Chronic left  basal ganglia infarct. Extensive periventricular hypodensity, favored to represent sequela of chronic microvascular ischemic change. No CT evidence of an acute cortical infarct. Somewhat hyperdense appearance of the bilateral thalami ganglia is new compared to prior CT dated 02/27/22. Vascular: No hyperdense vessel or unexpected calcification. Skull: Normal. Negative for fracture or focal lesion. Sinuses/Orbits: No middle ear or mastoid effusion. Paranasal sinuses are clear. Bilateral lens replacement. Orbits are otherwise unremarkable. Other: None. IMPRESSION: 1. Somewhat hyperdense appearance of the bilateral thalami and basal ganglia is new compared to prior CT dated 02/27/22. This is nonspecific and may be artifactual. Recommend further evaluation with contrast-enhanced brain MRI for more definitive characterization. 2. Otherwise no acute intracranial abnormality Electronically Signed   By: Lorenza Cambridge M.D.   On: 02/22/2023 14:41    EKG: Independently reviewed. NSR   Assessment and Plan: * Dizziness Patient already on tapering dose of ativan.  I think this may be contributing to Patient's current presentation.  Patient has had dizziness for about a month that she describes as vertigo.  MRI brain is pending.  I will also get physical therapy evaluation to get a safe ambulation plan for the patient.  Please note CT findings of the head above which I feel at this time are nonspecific.  I will also check a B12 level and give empiric treatment with thiamine.  This has been complicated by at least 4 falls as per patient over the last 1 month.  Per report given to me, patient has passed swallow eval, will get regular diet  Hyponatremia Seems to be chronic, I will check a serum osmolality, TSH and cortisol level.  I will also check urine sodium level  Unspecified convulsions (HCC) Home medications have been continued in this regard.  Again I discussed with patient that Ativan may be potentially causing her  current presentation, of course waiting for MRI as well  Asthma-COPD overlap syndrome Continue with patient's home medication in this regard.  Home  medications need to be collected once patient brings her medications from the home.  Patient is not sure of her medications, some of her time critical medications have been ordered based on prior documentation.  Patient will get home medications tomorrow.  Kindly review these or as per pharmacy home med rec.  Also patient's allergies to dietary ingredients are noted, however patient wanted regular diet and she will be careful to not order anything that might cause her allergy.    Advance Care Planning:   Code Status: Full Code patient has previously been a partial code, however up at this time patient is not sure if she wants to be a partial code.  Patient wants to be full code at this time  Consults: Physical therapy evaluation has been requested  Family Communication: Husband at the bedside  Severity of Illness: The appropriate patient status for this patient is OBSERVATION. Observation status is judged to be reasonable and necessary in order to provide the required intensity of service to ensure the patient's safety. The patient's presenting symptoms, physical exam findings, and initial radiographic and laboratory data in the context of their medical condition is felt to place them at decreased risk for further clinical deterioration. Furthermore, it is anticipated that the patient will be medically stable for discharge from the hospital within 2 midnights of admission.   Author: Nolberto Hanlon, MD 02/22/2023 9:13 PM  For on call review [email or web address].

## 2023-02-22 NOTE — Assessment & Plan Note (Signed)
Seems to be chronic, I will check a serum osmolality, TSH and cortisol level.  I will also check urine sodium level

## 2023-02-22 NOTE — Progress Notes (Signed)
Plan of Care Note for accepted transfer   Patient: Anne Rich MRN: [id number]   DOA: 02/22/2023  Facility requesting transfer: [facility] Requesting Provider: Dr. 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 She also complained of new slurred speech x 4 days. She saw her allergist who thought her speech was off and then saw her neurologist who also advised she go to ED due to concerns for stroke.   Vitals: stable Pertinent labs: none  CT head: Somewhat hyperdense appearance of the bilateral thalami and basal ganglia is new compared to prior CT dated 02/27/22. This is nonspecific and may be artifactual. Recommend further evaluation with contrast-enhanced brain MRI for more definitive characterization. 2. Otherwise no acute intracranial abnormality In ED given 1L IVF and [facility] asked to admit.   EdP states she has significant dizziness on standing which isn't new, but that her speech is slurred and somewhat dysarthric which is new. Poor historian.   Plan of care: The patient is accepted for admission to Progressive unit, at [facility]  Needs stroke w/u with MRI.   Author: Orland Mustard, MD 02/22/2023  Check [email or web address] for on-call coverage.  Nursing staff, Please call [facility] Admits & Consults System-Wide number on Amion as soon as patient's arrival, so appropriate admitting provider can evaluate the pt.

## 2023-02-22 NOTE — Assessment & Plan Note (Signed)
Home medications have been continued in this regard.  Again I discussed with patient that Ativan may be potentially causing her current presentation, of course waiting for MRI as well

## 2023-02-22 NOTE — ED Provider Notes (Signed)
[facility] Provider Note   CSN: [id number] Arrival date & time: 02/22/23  1116     History  Chief Complaint  Patient presents with   Dizziness    Anne Rich is a 72 y.o. female.   Dizziness 72 year old female history of seizures, left-sided ICA stenosis presenting for speech concerns.  Patient is here with her husband.  Patient states on Tuesday she was seeing one of her doctors who noted that her speech was slightly slurred and changed.  She did not think there was any difference.  Her husband states he feels like sometimes she gets more confused after breathing treatments, but he also feels like this week her speech has been slightly different.  No clear onset but has been at least since Tuesday.  She had a virtual point with her neurologist yesterday who was concerned for possible stroke and recommend they present for evaluation yesterday to the ED but patient did not want to go yesterday and presents today.  She does feel dizzy, and frequently feels presyncopal.  No spinning or frank vertigo.  She is eating and drinking normally.  No new medications or medication changes.  She reports history of prior stroke that she did not realize she had.  She has not had any headache or weakness or numbness or vision changes.  She does feel dizzy when she stands up.  Per chart review she is follows with vascular surgery for significant stenosis of the left ICA.     Home Medications Prior to Admission medications   Medication Sig Start Date End Date Taking? Authorizing Provider  albuterol (PROVENTIL) (2.5 MG/3ML) 0.083% nebulizer solution USE 1 VIAL IN NEBULIZER EVERY 4 TO 6 HOURS AS NEEDED FOR COUGH AND/OR WHEEZE.  Generic:  Ventolin 02/15/23   Alfonse Spruce, MD  albuterol (VENTOLIN HFA) 108 (90 Base) MCG/ACT inhaler Inhale 2 puffs into the lungs every 4 (four) hours as needed for wheezing or shortness of breath. 10/30/22   Alfonse Spruce,  MD  arformoterol (BROVANA) 15 MCG/2ML NEBU Take 2 mLs (15 mcg total) by nebulization 2 (two) times daily. 06/14/22   Mannam, Colbert Coyer, MD  azithromycin (ZITHROMAX) 250 MG tablet TAKE 2 TABLETS ON THE FIRST DAY AND 1 TABLET DAILY FOR 4 MORE DAYS. WAIT 3 DAYS AND THEN REPEAT THE ENTIRE COURSE. 12/03/22   Alfonse Spruce, MD  budesonide (PULMICORT) 0.5 MG/2ML nebulizer solution USE 1 VIAL  IN  NEBULIZER TWICE  DAILY - rinse mouth after treatment 10/30/22   Alfonse Spruce, MD  carbamazepine (TEGRETOL) 200 MG tablet Take 200-400 mg by mouth 2 (two) times daily. 200mg  tab in the morning and 400mg  at night    [provider]  cromolyn (OPTICROM) 4 % ophthalmic solution Place 2 drops into both eyes 4 (four) times daily as needed. 02/05/23   Hetty Blend, FNP  cyclobenzaprine (FLEXERIL) 5 MG tablet Take 5 mg by mouth daily.    [provider]  diclofenac sodium (VOLTAREN) 1 % GEL Apply 2 g topically 4 (four) times daily as needed (knee pain).  11/10/18   [provider]  doxycycline (VIBRAMYCIN) 100 MG capsule Take 1 capsule (100 mg total) by mouth 2 (two) times daily. 02/08/23   Peter Garter, PA  EPINEPHrine (EPIPEN 2-PAK) 0.3 mg/0.3 mL IJ SOAJ injection Inject 0.3 mg into the muscle Once PRN. 03/14/20   Alfonse Spruce, MD  estradiol (ESTRACE) 1 MG tablet Take 1 mg by mouth  daily. 11/19/17   [provider]  gabapentin (NEURONTIN) 600 MG tablet Take 600-1,200 mg by mouth 2 (two) times daily. 600mg  in the morning and 1200mg  at bedtime 11/25/18   [provider]  ipratropium (ATROVENT) 0.02 % nebulizer solution Take 2.5 mLs (0.5 mg total) by nebulization every 4 (four) hours as needed for wheezing or shortness of breath. 04/13/22   Alfonse Spruce, MD  levalbuterol Pauline Aus) 0.63 MG/3ML nebulizer solution Take 3 mLs (0.63 mg total) by nebulization every 4 (four) hours as needed for wheezing or shortness of breath. 05/18/22   Alfonse Spruce, MD   levETIRAcetam (KEPPRA) 1000 MG tablet Take 1,000-2,000 mg by mouth 2 (two) times daily. 1000mg  in the morning and 2000mg  at night    [provider]  levocetirizine (XYZAL) 5 MG tablet Take 1 tablet (5 mg total) by mouth daily as needed for allergies (for runny nose). 02/05/23   Hetty Blend, FNP  lisinopril (PRINIVIL,ZESTRIL) 10 MG tablet Take 20 mg by mouth daily. 12/10/17   [provider]  LORazepam (ATIVAN) 1 MG tablet Take 1 mg by mouth 3 (three) times daily. 03/08/22   [provider]  meclizine (ANTIVERT) 25 MG tablet Take 25 mg by mouth 2 (two) times daily as needed for dizziness or nausea.  01/27/13   [provider]  Melatonin 10 MG TABS Take 10 mg by mouth daily.    [provider]  mirtazapine (REMERON) 30 MG tablet Take 30 mg by mouth every evening.  12/06/18   [provider]  montelukast (SINGULAIR) 10 MG tablet TAKE 1 TABLET BY MOUTH EVERYDAY AT BEDTIME 02/08/23   Ambs, Norvel Richards, FNP  Multiple Vitamin (MULTIVITAMIN) tablet Take 1 tablet by mouth daily.     [provider]  omeprazole (PRILOSEC) 40 MG capsule Take 40 mg by mouth 2 (two) times a day.    [provider]  ondansetron (ZOFRAN) 4 MG tablet TAKE 1 TABLET (4 MG TOTAL) BY MOUTH 2 (TWO) TIMES DAILY AS NEEDED FOR NAUSEA. 12/26/21   Mannam, Colbert Coyer, MD  oxybutynin (DITROPAN) 5 MG tablet Take by mouth. 02/16/21   [provider]  OXYGEN Inhale 3 L into the lungs at bedtime. 4 L when walking    [provider]  predniSONE (DELTASONE) 10 MG tablet Take 10 mg by mouth daily with breakfast.    [provider]  predniSONE (STERAPRED UNI-PAK 21 TAB) 10 MG (21) TBPK tablet Take by mouth daily. Take 6 tabs by mouth daily  for 2 days, then 5 tabs for 2 days, then 4 tabs for 2 days, then 3 tabs for 2 days, 2 tabs for 2 days, then 1 tab by mouth daily for 2 days 02/08/23   Sherian Maroon A, PA  QUEtiapine (SEROQUEL) 25 MG tablet Take 25-50 mg by mouth at  bedtime. 09/05/21   [provider]  revefenacin (YUPELRI) 175 MCG/3ML nebulizer solution Inhale 3 mLs (175 mcg total) into the lungs daily. 06/14/22   Mannam, Colbert Coyer, MD  roflumilast (DALIRESP) 500 MCG TABS tablet Take 1 tablet (500 mcg total) by mouth daily. 03/19/22   Mannam, Colbert Coyer, MD  TESSALON PERLES 100 MG capsule Take 1 capsule (100 mg total) by mouth 3 (three) times daily as needed for cough. 07/25/22   Alfonse Spruce, MD      Allergies    Iodine, Iodine-131, Penicillins, Pineapple, Shellfish allergy, Molds & smuts, Pravastatin, Shellfish-derived products, Xanax [alprazolam], and Iodinated contrast media    Review  of Systems   Review of Systems  Neurological:  Positive for dizziness.  Review of systems completed and notable as per HPI.  ROS otherwise negative.   Physical Exam Updated Vital Signs BP (!) 134/90   Pulse 79   Temp 97.6 F (36.4 C) (Oral)   Resp 20   Ht 5\' 8"  (1.727 m)   Wt 68 kg   SpO2 96%   BMI 22.81 kg/m  Physical Exam Vitals and nursing note reviewed.  Constitutional:      General: She is not in acute distress.    Appearance: She is well-developed.  HENT:     Head: Normocephalic and atraumatic.  Eyes:     Conjunctiva/sclera: Conjunctivae normal.  Cardiovascular:     Rate and Rhythm: Normal rate and regular rhythm.     Heart sounds: No murmur heard. Pulmonary:     Effort: Pulmonary effort is normal. No respiratory distress.     Breath sounds: Normal breath sounds.  Abdominal:     Palpations: Abdomen is soft.     Tenderness: There is no abdominal tenderness.  Musculoskeletal:        General: No swelling.     Cervical back: Neck supple.  Skin:    General: Skin is warm and dry.     Capillary Refill: Capillary refill takes less than 2 seconds.  Neurological:     Mental Status: She is alert and oriented to person, place, and time.     Cranial Nerves: No cranial nerve deficit.     Sensory: No sensory deficit.     Motor: No  weakness.     Coordination: Coordination normal.     Deep Tendon Reflexes: Reflexes normal.     Comments: Awake and alert, oriented appropriately.  She has occasional dysarthria and some difficulty with word production.  She is no cranial nerve deficits.  She is normal finger-to-nose bilaterally, full strength and normal sensation of bilateral upper and lower extremities.  Psychiatric:        Mood and Affect: Mood normal.     ED Results / Procedures / Treatments   Labs (all labs ordered are listed, but only abnormal results are displayed) Labs Reviewed  URINALYSIS, ROUTINE W REFLEX MICROSCOPIC - Abnormal; Notable for the following components:      Result Value   pH 8.5 (*)    All other components within normal limits  COMPREHENSIVE METABOLIC PANEL - Abnormal; Notable for the following components:   Sodium 129 (*)    Chloride 94 (*)    Glucose, Bld 125 (*)    Calcium 8.3 (*)    Total Protein 6.3 (*)    Albumin 3.3 (*)    All other components within normal limits  CBC WITH DIFFERENTIAL/PLATELET - Abnormal; Notable for the following components:   WBC 10.6 (*)    RBC 3.83 (*)    HCT 35.8 (*)    Neutro Abs 8.6 (*)    All other components within normal limits    EKG EKG Interpretation Date/Time:  Friday February 22 2023 13:26:08 EDT Ventricular Rate:  75 PR Interval:  151 QRS Duration:  88 QT Interval:  413 QTC Calculation: 462 R Axis:   76  Text Interpretation: Sinus rhythm Confirmed by Fulton Reek [phone]) on 02/22/2023 1:30:04 PM  Radiology CT Head Wo Contrast  Result Date: 02/22/2023 CLINICAL DATA:  Neuro deficit, acute, stroke suspected EXAM: CT HEAD WITHOUT CONTRAST TECHNIQUE: Contiguous axial images were obtained from the base of the skull through the  vertex without intravenous contrast. RADIATION DOSE REDUCTION: This exam was performed according to the departmental dose-optimization program which includes automated exposure control, adjustment of the mA and/or kV  according to patient size and/or use of iterative reconstruction technique. COMPARISON:  Brain MR 12/31/22, CT head 02/27/22 FINDINGS: Brain: No hemorrhage. No hydrocephalus. No extra-axial fluid collection. Chronic left basal ganglia infarct. Extensive periventricular hypodensity, favored to represent sequela of chronic microvascular ischemic change. No CT evidence of an acute cortical infarct. Somewhat hyperdense appearance of the bilateral thalami ganglia is new compared to prior CT dated 02/27/22. Vascular: No hyperdense vessel or unexpected calcification. Skull: Normal. Negative for fracture or focal lesion. Sinuses/Orbits: No middle ear or mastoid effusion. Paranasal sinuses are clear. Bilateral lens replacement. Orbits are otherwise unremarkable. Other: None. IMPRESSION: 1. Somewhat hyperdense appearance of the bilateral thalami and basal ganglia is new compared to prior CT dated 02/27/22. This is nonspecific and may be artifactual. Recommend further evaluation with contrast-enhanced brain MRI for more definitive characterization. 2. Otherwise no acute intracranial abnormality Electronically Signed   By: Lorenza Cambridge M.D.   On: 02/22/2023 14:41    Procedures Procedures    Medications Ordered in ED Medications  lactated ringers bolus 1,000 mL (0 mLs Intravenous Stopped 02/22/23 1441)    ED Course/ Medical Decision Making/ A&P                                 Medical Decision Making Amount and/or Complexity of Data Reviewed Labs: ordered. Radiology: ordered.  Risk Decision regarding hospitalization.   Medical Decision Making:   Shandie Suell is a 72 y.o. female who presented to the ED today with dizziness, speech difficulty.  Vital signs notable for soft blood pressure.  She has chronic dizziness which sounds more like orthostasis and suspect like an ongoing issue.  Will give some fluids.  However she also has some left ICA stenosis and also reports 4 days of speech difficulty.  On exam she  does have some slightly slurred speech that she seems to have some difficulty producing although patient is poor historian.  She was seen by neurology yesterday and noted to have this.  I do not think she is within the stroke window, obtain CTA as well as lab workup.   Patient placed on continuous vitals and telemetry monitoring while in ED which was reviewed periodically.  Reviewed and confirmed nursing documentation for past medical history, family history, social history.  Reassessment and Plan:   Repeat evaluation she feels like she is feeling slightly better after fluids.  Her lab work is notable for sodium of 129 similar to prior.  He has mild leukocytosis but no signs of urinary tract infection.  She does have some abnormality of her bilateral thalami on CT scan.  Could be artifact, although I am concerned she could have had some sort of stroke or other abnormality.  I think she would benefit from MRI for evaluation given her new speech difficulties and persistent dizziness.  Discussed with hospitalist Dr. Artis Flock, patient admitted.  Patient updated on plan.   Patient's presentation is most consistent with acute presentation with potential threat to life or bodily function.           Final Clinical Impression(s) / ED Diagnoses Final diagnoses:  Difficulty with speech    Rx / DC Orders ED Discharge Orders     None         Earlene Plater,  Elenor Quinones, MD 02/22/23 1554

## 2023-02-22 NOTE — Assessment & Plan Note (Signed)
Continue with patient's home medication in this regard.  Home medications need to be collected once patient brings her medications from the home.

## 2023-02-22 NOTE — ED Notes (Signed)
Patient unable to urinate at this time. Patient advised to inform us when she needs to use the bathroom.

## 2023-02-23 DIAGNOSIS — R42 Dizziness and giddiness: Secondary | ICD-10-CM | POA: Diagnosis not present

## 2023-02-23 LAB — CBC
HCT: 36 % (ref 36.0–46.0)
Hemoglobin: 12.2 g/dL (ref 12.0–15.0)
MCH: 31.5 pg (ref 26.0–34.0)
MCHC: 33.9 g/dL (ref 30.0–36.0)
MCV: 93 fL (ref 80.0–100.0)
Platelets: 373 10*3/uL (ref 150–400)
RBC: 3.87 MIL/uL (ref 3.87–5.11)
RDW: 12.6 % (ref 11.5–15.5)
WBC: 8.4 10*3/uL (ref 4.0–10.5)
nRBC: 0 % (ref 0.0–0.2)

## 2023-02-23 LAB — BASIC METABOLIC PANEL
Anion gap: 11 (ref 5–15)
BUN: 6 mg/dL — ABNORMAL LOW (ref 8–23)
CO2: 27 mmol/L (ref 22–32)
Calcium: 8.6 mg/dL — ABNORMAL LOW (ref 8.9–10.3)
Chloride: 96 mmol/L — ABNORMAL LOW (ref 98–111)
Creatinine, Ser: 0.74 mg/dL (ref 0.44–1.00)
GFR, Estimated: >60 mL/min (ref >60)
Glucose, Bld: 101 mg/dL — ABNORMAL HIGH (ref 70–99)
Potassium: 3.7 mmol/L (ref 3.5–5.1)
Sodium: 134 mmol/L — ABNORMAL LOW (ref 135–145)

## 2023-02-23 LAB — TSH: TSH: 1.014 u[IU]/mL (ref 0.350–4.500)

## 2023-02-23 LAB — PROTIME-INR
INR: 0.9 (ref 0.8–1.2)
Prothrombin Time: 12.3 s (ref 11.4–15.2)

## 2023-02-23 LAB — APTT: aPTT: 29 s (ref 24–36)

## 2023-02-23 LAB — CORTISOL-AM, BLOOD: Cortisol - AM: 7.1 ug/dL (ref 6.7–22.6)

## 2023-02-23 LAB — VITAMIN B12: Vitamin B-12: 1011 pg/mL — ABNORMAL HIGH (ref 180–914)

## 2023-02-23 NOTE — Plan of Care (Signed)
  Problem: Education: Goal: Knowledge of General Education information will improve Description: Including pain rating scale, medication(s)/side effects and non-pharmacologic comfort measures Outcome: Progressing   Problem: Health Behavior/Discharge Planning: Goal: Ability to manage health-related needs will improve Outcome: Progressing   Problem: Clinical Measurements: Goal: Ability to maintain clinical measurements within normal limits will improve Outcome: Progressing Goal: Will remain free from infection Outcome: Progressing Goal: Diagnostic test results will improve Outcome: Progressing Goal: Respiratory complications will improve Outcome: Progressing Goal: Cardiovascular complication will be avoided Outcome: Progressing   Problem: Activity: Goal: Risk for activity intolerance will decrease Outcome: Progressing   Problem: Nutrition: Goal: Adequate nutrition will be maintained Outcome: Progressing   Problem: Pain Managment: Goal: General experience of comfort will improve Outcome: Progressing   Problem: Safety: Goal: Ability to remain free from injury will improve Outcome: Progressing   

## 2023-02-23 NOTE — Evaluation (Signed)
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  Clinical Impression  Pt admitted with above diagnosis. Reports previous falls at home, furniture walking but has rollator. Refused to use assistive device during assessment today showing impaired safety awareness. Ambulates in hallway with CGA, using rails to correct imbalance. Educated on safety, recommendations for rollator use, and 24/7 supervision with HHPT follow-up after d/c. Pt and husband verbalize understanding and in agreement. Very tangential in conversation providing convoluted hx of symptoms and current complaints. Low effort with LE MMT not consistent with functional abilities. Will follow acutely. Pt currently with functional limitations due to the deficits listed below (see PT Problem List). Pt will benefit from acute skilled PT to increase their independence and safety with mobility to allow discharge.           If plan is discharge home, recommend the following: A little help with walking and/or transfers;Supervision due to cognitive status;Assistance with cooking/housework;Direct supervision/assist for medications management;Direct supervision/assist for financial management;Assist for transportation   Can travel by private vehicle     yes    Equipment Recommendations None recommended by PT     Functional Status Assessment Patient has had a recent decline in their functional status and demonstrates the ability to make significant improvements in function in a reasonable and predictable amount of time.     Precautions / Restrictions Precautions Precautions:  Fall Restrictions Weight Bearing Restrictions: No      Mobility  Bed Mobility Overal bed mobility: Modified Independent                  Transfers Overall transfer level: Needs assistance Equipment used: None Transfers: Sit to/from Stand Sit to Stand: Supervision           General transfer comment: supervision for safety, mild unsteadiness but able to self correct. refused RW    Ambulation/Gait Ambulation/Gait assistance: Contact guard assist Gait Distance (Feet): 125 Feet Assistive device: None Gait Pattern/deviations: Step-through pattern, Staggering left, Staggering right, Drifts right/left, Decreased stride length Gait velocity: decr Gait velocity interpretation: <1.8 ft/sec, indicate of risk for recurrent falls   General Gait Details: Ambulates with intermittent instability, reaching for rails in hallway, slight stagger bil directions but corrects without PT intervention. Educated on awareness. Refuses to use RW indicating poor safety awareness. Educated husband on findings and recommendations.  Stairs            Wheelchair Mobility     Tilt Bed    Modified Rankin (Stroke Patients Only)       Balance Overall balance assessment: Needs assistance Sitting-balance support: No upper extremity supported, Feet supported Sitting balance-Leahy Scale: Good     Standing balance support: No upper extremity supported, During functional activity Standing balance-Leahy Scale: Fair                               Pertinent Vitals/Pain Pain Assessment Pain Assessment: No/denies pain    Home Living Family/patient expects to be discharged to:: Private residence Living Arrangements: Spouse/significant other Available Help at Discharge: Family;Available 24 hours/day Type of Home: Apartment Home Access: Elevator       Home  Layout: One level Home Equipment: Rollator (4 wheels);Grab bars - toilet;Shower seat;Grab bars - tub/shower;Hand held  shower head      Prior Function Prior Level of Function : Independent/Modified Independent             Mobility Comments: reports only uses rollator [place] walk at the mall ADLs Comments: Ind     Extremity/Trunk Assessment   Upper Extremity Assessment Upper Extremity Assessment: Defer to OT evaluation    Lower Extremity Assessment Lower Extremity Assessment: Generalized weakness (very low effort and inconsistent with functional status)       Communication   Communication Communication: No apparent difficulties  Cognition Arousal: Alert Behavior During Therapy: Restless Overall Cognitive Status: Within Functional Limits for tasks assessed Area of Impairment: Safety/judgement                         Safety/Judgement: Decreased awareness of safety     General Comments: tangential        General Comments General comments (skin integrity, edema, etc.): Very tangential with conversation. Difficulty obtaining full history of symptoms and complaints. Low effort with LE testing, not consistent with functional abilities. Reports dizziness when walking at home, intermittent, furniture walks in home. States she has had a couple episodes of syncope that was precipitated by RUE cramping but no prodromal dizziness. Perseverating on going home, not altering her medications, and having telebox removed. Husband is going out of town at [employer] but arranged sister to supervise due to behavior and reduced safety awareness.    Exercises     Assessment/Plan    PT Assessment Patient needs continued PT services  PT Problem List Decreased strength;Decreased activity tolerance;Decreased balance;Decreased mobility;Decreased cognition;Decreased knowledge of use of DME;Decreased safety awareness;Decreased knowledge of precautions       PT Treatment Interventions DME instruction;Gait training;Functional mobility training;Therapeutic activities;Therapeutic exercise;Balance  training;Neuromuscular re-education;Cognitive remediation;Patient/family education    PT Goals (Current goals can be found in the Care Plan section)  Acute Rehab PT Goals Patient Stated Goal: go home now PT Goal Formulation: With patient/family Time For Goal Achievement: 03/02/23 Potential to Achieve Goals: Good    Frequency Min 1X/week     Co-evaluation               AM-PAC PT "6 Clicks" Mobility  Outcome Measure Help needed turning from your back to your side while in a flat bed without using bedrails?: None Help needed moving from lying on your back to sitting on the side of a flat bed without using bedrails?: None Help needed moving to and from a bed to a chair (including a wheelchair)?: A Little Help needed standing up from a chair using your arms (e.g., wheelchair or bedside chair)?: A Little Help needed to walk in hospital room?: A Little Help needed climbing 3-5 steps with a railing? : A Little 6 Click Score: 20    End of Session Equipment Utilized During Treatment: Gait belt Activity Tolerance: Patient tolerated treatment well Patient left: in bed;with call bell/phone within reach;with bed alarm set;with family/visitor present;with SCD's reapplied Nurse Communication: Mobility status PT Visit Diagnosis: Unsteadiness on feet (R26.81);Other symptoms and signs involving the nervous system (R29.898);History of falling (Z91.81);Muscle weakness (generalized) (M62.81);Other abnormalities of gait and mobility (R26.89);Dizziness and giddiness (R42)    Time: 8657-8469 PT Time Calculation (min) (ACUTE ONLY): 23 min   Charges:   PT Evaluation $PT Eval Low Complexity: 1 Low PT Treatments $Gait Training: 8-22 mins PT General Charges $$  ACUTE PT VISIT: 1 Visit         Kathlyn Sacramento, PT, DPT [facility]  Rehabilitation Services Physical Therapist Office: [phone] Website: [email or web address]   Berton Mount 02/23/2023, 10:53 AM

## 2023-02-23 NOTE — Discharge Summary (Signed)
Physician Discharge Summary  Anne Rich ONG:[id number] DOB: June 12, 1950 DOA: 02/22/2023  PCP: Doreen Salvage, PA-C  Admit date: 02/22/2023 Discharge date: 02/23/2023    Admitted From: Home Disposition: Home  Recommendations for Outpatient Follow-up:  Follow up with PCP in 1-2 weeks Please obtain BMP/CBC in one week Please follow up with your PCP on the following pending results: Unresulted Labs (From admission, onward)     Start     Ordered   03/01/23 0500  Creatinine, serum  (enoxaparin (LOVENOX)    CrCl >/= 30 ml/min)  Weekly,   R     Comments: while on enoxaparin therapy    02/22/23 1904   02/22/23 2120  Sodium, urine, random  Once,   R        02/22/23 2119              Home Health: Yes Equipment/Devices: None, has rollator and 24-hour supervision at home.  Discharge Condition: Stable CODE STATUS: Full code Diet recommendation: Cardiac  Following HPI is copied from admitting hospitalist H&P. HPI: Anne Rich is a 72 y.o. female with medical history significant of polio that was diagnosed when patient was 72 months old and patient reports some longstanding lower extremity weakness and some respiratory weakness since then.  Patient also has asthma that has been very difficult to control.  Patient reports that she was in her usual state of health till approximately a month ago when she started having sensation of vertigo on and off with no precipitating or relieving factor and there have been about 4 falls of patient basically falling backwards straight to the back and back of her head due to her vertigo.  Patient reports no new weakness of arm or legs or any reduced sensation or paresthesias.  Patient was evaluated by her allergist approximately 4 days ago who noticed that the patient was having change in the nature of her speech.  Patient's allergist was seeing her after several weeks time.  And also family has not noticed any change in her speech nor has the patient.  The  allergist advised that the patient be evaluated by neurology.  The neurologist advised the patient over the phone to come to the ER as it would take a long amount of time as an outpatient to get this worked up.   Patient reports having no bladder or bowel changes, reports no weakness no neck trauma no back pain.  Patient therefore came to one of our associated ERs on the advice of the neurologist and is directly admitted to the [facility].  Patient reports otherwise no complaints at this time.  Was hungry when initially admitted to the hospital has had some diet since then.  Patient is accompanied by her husband at the bedside  Subjective: Seen and examined, husband at the bedside.  Patient has no new complaint.  She is eager to go home.  She is not as dizzy as she has been.  Further details below.  Brief/Interim Summary: Patient was mainly admitted due to dizziness, vertigo and recurrent falls and intermittent dysarthria.  She was incidentally found to have hyponatremia of 129.  Out of concern of a stroke, patient underwent extensive stroke workup including MRI brain which was negative for stroke.  Hyponatremia also improved with IV fluids and currently 134.  She has no complaints, no shortness of breath and no wheezes on examination.  She appears to be comfortable, alert and oriented and back at her baseline.  Had a lengthy discussion  with the patient.  She told me that she has been dizzy intermittently for about a year and she does not care about it.  For past 4 weeks, however she has had 3-4 falls falling backward.  She also said that she did hit her head.  She said that she had some speech difficulties as well but she continued to defer hospital care because she was going to get married which happened last Monday.  Even now, patient is more concerned about how she is looking and that she has not done her make-up.  And she is fully alert and oriented.  Based on the fact that the stroke is ruled  out and the fact that patient is on several sedative medications including Seroquel, Tegretol, Neurontin, Flexeril, Keppra, Ativan, Remeron, I highly suspect that her presentation and dizziness is likely secondary to polypharmacy.  I strongly advised that she should reduce her medications especially Flexeril and Ativan.  She believes that Ativan is given to her for seizure prevention and she has been taking for 20 years and does not want to stop taking them.  She has a strong trust in her neurologist and did not want me to make any adjustments to her medications.  She understands that she remains at high risk of recurrent falls if she were to continue to take those medications so I strongly advised her to discuss this with her neurologist.  Per her insistence, I am discharging her home.  She was seen by PT OT and they recommended home health PT for home safety follow-up, she has poor insights into deficits and unsteady gait but has a rollator and 24/7 supervision available at home.    Discharge plan was discussed with patient and/or family member and they verbalized understanding and agreed with it.  Discharge Diagnoses:  Principal Problem:   Dizziness Active Problems:   Asthma-COPD overlap syndrome   Chronic respiratory failure with hypoxia (HCC), 3L home O2   Essential hypertension   Gastroesophageal reflux disease   History of cerebrovascular accident (CVA) with residual deficit   Hypercholesterolemia   Idiopathic peripheral neuropathy   Unspecified convulsions (HCC)   Current mild episode of major depressive disorder (HCC)   Dysarthria   Hyponatremia    Discharge Instructions   Allergies as of 02/23/2023       Reactions   Iodine Itching, Rash, Swelling   IV contrast   Iodine-131 Itching, Swelling   Pt states causes severe itching Pt states causes severe itching   Penicillins Hives, Swelling   Swelling of the throat   Pineapple Other (See Comments), Swelling   Pt states causes  her throat swelling Pt states causes her throat swelling   Shellfish Allergy Rash, Swelling   Had reaction to cardiac cath dye  Had seizure ,rash ,itch Oct. 2012 Had reaction to cardiac cath dye  Had seizure ,rash ,itch Oct. 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   albuterol (2.5 MG/3ML) 0.083%  nebulizer solution Commonly known as: PROVENTIL USE 1 VIAL IN NEBULIZER EVERY 4 TO 6 HOURS AS NEEDED FOR COUGH AND/OR WHEEZE.  Generic:  Ventolin   arformoterol 15 MCG/2ML Nebu Commonly known as: BROVANA Take 2 mLs (15 mcg total) by nebulization 2 (two) times daily.   azithromycin 250 MG tablet Commonly known as: ZITHROMAX TAKE 2 TABLETS ON THE FIRST DAY AND 1 TABLET DAILY FOR 4 MORE DAYS. WAIT 3 DAYS AND THEN REPEAT THE ENTIRE COURSE.   budesonide 0.5 MG/2ML nebulizer solution Commonly known as: PULMICORT USE 1 VIAL  IN  NEBULIZER TWICE  DAILY - rinse mouth after treatment   carbamazepine 200 MG tablet Commonly known as: TEGRETOL Take 200-400 mg by mouth 2 (two) times daily. 200mg  tab in the morning and 400mg  at night   cromolyn 4 % ophthalmic solution Commonly known as: OPTICROM Place 2 drops into both eyes 4 (four) times daily as needed.   cyclobenzaprine 5 MG tablet Commonly known as: FLEXERIL Take 5 mg by mouth daily.   diclofenac sodium 1 % Gel Commonly known as: VOLTAREN Apply 2 g topically 4 (four) times daily as needed (knee pain).   doxycycline 100 MG capsule Commonly known as: VIBRAMYCIN Take 1 capsule (100 mg  total) by mouth 2 (two) times daily.   EPINEPHrine 0.3 mg/0.3 mL Soaj injection Commonly known as: EpiPen 2-Pak Inject 0.3 mg into the muscle Once PRN.   estradiol 1 MG tablet Commonly known as: ESTRACE Take 1 mg by mouth daily.   gabapentin 600 MG tablet Commonly known as: NEURONTIN Take 600-1,200 mg by mouth 2 (two) times daily. 600mg  in the morning and 1200mg  at bedtime   ipratropium 0.02 % nebulizer solution Commonly known as: ATROVENT Take 2.5 mLs (0.5 mg total) by nebulization every 4 (four) hours as needed for wheezing or shortness of breath.   levalbuterol 0.63 MG/3ML nebulizer solution Commonly known as: XOPENEX Take 3 mLs (0.63 mg total) by nebulization every 4 (four) hours as needed for wheezing or shortness of breath.   levETIRAcetam 1000 MG tablet Commonly known as: KEPPRA Take 1,000-2,000 mg by mouth 2 (two) times daily. 1000mg  in the morning and 2000mg  at night   levocetirizine 5 MG tablet Commonly known as: XYZAL Take 1 tablet (5 mg total) by mouth daily as needed for allergies (for runny nose).   lisinopril 10 MG tablet Commonly known as: ZESTRIL Take 20 mg by mouth daily.   LORazepam 1 MG tablet Commonly known as: ATIVAN Take 1 mg by mouth 3 (three) times daily.   meclizine 25 MG tablet Commonly known as: ANTIVERT Take 25 mg by mouth 2 (two) times daily as needed for dizziness or nausea.   Melatonin 10 MG Tabs Take 10 mg by mouth daily.   mirtazapine 30 MG tablet Commonly known as: REMERON Take 30 mg by mouth every evening.   montelukast 10 MG tablet Commonly known as: SINGULAIR TAKE 1 TABLET BY MOUTH EVERYDAY AT BEDTIME   multivitamin tablet Take 1 tablet by mouth daily.   omeprazole 40 MG capsule Commonly known as: PRILOSEC Take 40 mg by mouth 2 (two) times a day.   ondansetron 4 MG tablet Commonly known as: ZOFRAN TAKE 1 TABLET (4 MG TOTAL) BY MOUTH 2 (TWO) TIMES DAILY AS NEEDED FOR NAUSEA.   oxybutynin 5 MG tablet Commonly known  as: DITROPAN Take by mouth.   OXYGEN Inhale 3 L into the lungs at bedtime. 4 L when walking   predniSONE 10 MG tablet Commonly known  as: DELTASONE Take 10 mg by mouth daily with breakfast.   predniSONE 10 MG (21) Tbpk tablet Commonly known as: STERAPRED UNI-PAK 21 TAB Take by mouth daily. Take 6 tabs by mouth daily  for 2 days, then 5 tabs for 2 days, then 4 tabs for 2 days, then 3 tabs for 2 days, 2 tabs for 2 days, then 1 tab by mouth daily for 2 days   QUEtiapine 25 MG tablet Commonly known as: SEROQUEL Take 25-50 mg by mouth at bedtime.   roflumilast 500 MCG Tabs tablet Commonly known as: DALIRESP TAKE 1 TABLET DAILY   Tessalon Perles 100 MG capsule Generic drug: benzonatate Take 1 capsule (100 mg total) by mouth 3 (three) times daily as needed for cough.   Yupelri 175 MCG/3ML nebulizer solution Generic drug: revefenacin Inhale 3 mLs (175 mcg total) into the lungs daily.        Follow-up Information     [name], Dorinda Dinning, PA-C Follow up in 1 week(s).   Specialty: Internal Medicine Contact information: [address] [place] [employer] [place] [address] [phone]                Allergies  Allergen Reactions   Iodine Itching, Rash and Swelling    IV contrast    Iodine-131 Itching and Swelling    Pt states causes severe itching Pt states causes severe itching    Penicillins Hives and Swelling    Swelling of the throat   Pineapple Other (See Comments) and Swelling    Pt states causes her throat swelling Pt states causes her throat swelling    Shellfish Allergy Rash and Swelling    Had reaction to cardiac cath dye  Had seizure ,rash ,itch Oct. 2012 Had reaction to cardiac cath dye  Had seizure ,rash ,itch Oct. 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 COMPARISON:  12/23/2022 FINDINGS: Brain: No acute infarct, mass effect or extra-axial collection. No chronic microhemorrhage or siderosis. There is multifocal hyperintense T2-weighted signal within the white matter. Parenchymal volume and CSF spaces are normal. The midline structures are normal. Vascular: Normal flow voids. Skull and upper cervical spine: Normal marrow signal. Sinuses/Orbits: Negative. Other: None. IMPRESSION: 1. No acute intracranial abnormality. 2. Findings of chronic microvascular ischemia. Electronically Signed   By: Deatra Robinson M.D.   On: 02/23/2023 02:42   CT Head Wo Contrast  Result Date: 02/22/2023 CLINICAL DATA:  Neuro deficit, acute, stroke suspected EXAM: CT HEAD WITHOUT CONTRAST TECHNIQUE: Contiguous axial images were obtained from the base of the skull through the vertex without intravenous contrast. RADIATION DOSE REDUCTION: This exam was performed according to the departmental dose-optimization program which includes automated exposure control, adjustment of the mA and/or kV according to patient size and/or use of iterative reconstruction technique. COMPARISON:  Brain MR 12/31/22, CT head 02/27/22 FINDINGS: Brain: No hemorrhage. No hydrocephalus. No extra-axial fluid collection. Chronic left basal ganglia infarct. Extensive periventricular hypodensity, favored to represent sequela of chronic microvascular  ischemic change. No CT evidence of an acute cortical infarct. Somewhat hyperdense appearance of the bilateral thalami ganglia is new compared to prior CT dated 02/27/22. Vascular: No hyperdense vessel or unexpected calcification. Skull: Normal. Negative for  fracture or focal lesion. Sinuses/Orbits: No middle ear or mastoid effusion. Paranasal sinuses are clear. Bilateral lens replacement. Orbits are otherwise unremarkable. Other: None. IMPRESSION: 1. Somewhat hyperdense appearance of the bilateral thalami and basal ganglia is new compared to prior CT dated 02/27/22. This is nonspecific and may be artifactual. Recommend further evaluation with contrast-enhanced brain MRI for more definitive characterization. 2. Otherwise no acute intracranial abnormality Electronically Signed   By: Lorenza Cambridge M.D.   On: 02/22/2023 14:41   DG Chest Port 1 View  Result Date: 02/08/2023 CLINICAL DATA:  Shortness of breath EXAM: PORTABLE CHEST 1 VIEW COMPARISON:  12/23/2022.  Older exams as well FINDINGS: Hyperinflation. No consolidation, pneumothorax or effusion. No edema. Normal cardiopericardial silhouette. Overlapping cardiac leads. Old bilateral rib fractures. IMPRESSION: No acute cardiopulmonary disease.  Hyperinflation.  Chronic change Electronically Signed   By: Karen Kays M.D.   On: 02/08/2023 10:29     Discharge Exam: Vitals:   02/23/23 0912 02/23/23 0916  BP:    Pulse:    Resp:    Temp:    SpO2: 95% 95%   Vitals:   02/23/23 0343 02/23/23 0730 02/23/23 0912 02/23/23 0916  BP: 130/70 132/67    Pulse: 77 88    Resp:  17    Temp: 97.9 F (36.6 C) (!) 97.5 F (36.4 C)    TempSrc: Axillary Oral    SpO2: 100% 95% 95% 95%  Weight:      Height:        General: Pt is alert, awake, not in acute distress Cardiovascular: RRR, S1/S2 +, no rubs, no gallops Respiratory: CTA bilaterally, no wheezing, no rhonchi Abdominal: Soft, NT, ND, bowel sounds + Extremities: no edema, no cyanosis    The results of significant diagnostics from this hospitalization (including imaging, microbiology, ancillary and laboratory) are listed below for reference.     Microbiology: No results found for this or any previous visit (from the past 240 hour(s)).   Labs: BNP  (last 3 results) No results for input(s): "BNP" in the last 8760 hours. Basic Metabolic Panel: Recent Labs  Lab 02/22/23 1240 02/22/23 1954 02/23/23 0613  NA 129*  --  134*  K 4.7  --  3.7  CL 94*  --  96*  CO2 27  --  27  GLUCOSE 125*  --  101*  BUN 11  --  6*  CREATININE 0.79 0.70 0.74  CALCIUM 8.3*  --  8.6*   Liver Function Tests: Recent Labs  Lab 02/22/23 1240  AST 19  ALT 16  ALKPHOS 62  BILITOT 0.3  PROT 6.3*  ALBUMIN 3.3*   No results for input(s): "LIPASE", "AMYLASE" in the last 168 hours. No results for input(s): "AMMONIA" in the last 168 hours. CBC: Recent Labs  Lab 02/22/23 1240 02/22/23 1954 02/23/23 0613  WBC 10.6* 11.2* 8.4  NEUTROABS 8.6*  --   --   HGB 12.0 11.8* 12.2  HCT 35.8* 35.8* 36.0  MCV 93.5 91.3 93.0  PLT 395 402* 373   Cardiac Enzymes: No results for input(s): "CKTOTAL", "CKMB", "CKMBINDEX", "TROPONINI" in the last 168 hours. BNP: Invalid input(s): "POCBNP" CBG: No results for input(s): "GLUCAP" in the last 168 hours. D-Dimer No results for input(s): "DDIMER" in the last 72 hours. Hgb  A1c No results for input(s): "HGBA1C" in the last 72 hours. Lipid Profile No results for input(s): "CHOL", "HDL", "LDLCALC", "TRIG", "CHOLHDL", "LDLDIRECT" in the last 72 hours. Thyroid function studies Recent Labs    02/23/23 0613  TSH 1.014   Anemia work up Recent Labs    02/23/23 0613  VITAMINB12 1,011*   Urinalysis    Component Value Date/Time   COLORURINE YELLOW 02/22/2023 1240   APPEARANCEUR CLEAR 02/22/2023 1240   LABSPEC 1.015 02/22/2023 1240   PHURINE 8.5 (H) 02/22/2023 1240   GLUCOSEU NEGATIVE 02/22/2023 1240   HGBUR NEGATIVE 02/22/2023 1240   BILIRUBINUR NEGATIVE 02/22/2023 1240   KETONESUR NEGATIVE 02/22/2023 1240   PROTEINUR NEGATIVE 02/22/2023 1240   NITRITE NEGATIVE 02/22/2023 1240   LEUKOCYTESUR NEGATIVE 02/22/2023 1240   Sepsis Labs Recent Labs  Lab 02/22/23 1240 02/22/23 1954 02/23/23 0613  WBC 10.6*  11.2* 8.4   Microbiology No results found for this or any previous visit (from the past 240 hour(s)).  FURTHER DISCHARGE INSTRUCTIONS:   Get Medicines reviewed and adjusted: Please take all your medications with you for your next visit with your Primary MD   Laboratory/radiological data: Please request your Primary MD to go over all hospital tests and procedure/radiological results at the follow up, please ask your Primary MD to get all Hospital records sent to his/her office.   In some cases, they will be blood work, cultures and biopsy results pending at the time of your discharge. Please request that your primary care M.D. goes through all the records of your hospital data and follows up on these results.   Also Note the following: If you experience worsening of your admission symptoms, develop shortness of breath, life threatening emergency, suicidal or homicidal thoughts you must seek medical attention immediately by calling 911 or calling your MD immediately  if symptoms less severe.   You must read complete instructions/literature along with all the possible adverse reactions/side effects for all the Medicines you take and that have been prescribed to you. Take any new Medicines after you have completely understood and accpet all the possible adverse reactions/side effects.    Do not drive when taking Pain medications or sleeping medications (Benzodaizepines)   Do not take more than prescribed Pain, Sleep and Anxiety Medications. It is not advisable to combine anxiety,sleep and pain medications without talking with your primary care practitioner   Special Instructions: If you have smoked or chewed Tobacco  in the last 2 yrs please stop smoking, stop any regular Alcohol  and or any Recreational drug use.   Wear Seat belts while driving.   Please note: You were cared for by a hospitalist during your hospital stay. Once you are discharged, your primary care physician will handle any  further medical issues. Please note that NO REFILLS for any discharge medications will be authorized once you are discharged, as it is imperative that you return to your primary care physician (or establish a relationship with a primary care physician if you do not have one) for your post hospital discharge needs so that they can reassess your need for medications and monitor your lab values  Time coordinating discharge: Over 30 minutes  SIGNED:   Hughie Closs, MD  [facility] 02/23/2023, 10:45 AM *Please note that this is a verbal dictation therefore any spelling or grammatical errors are due to the "Dragon Medical One" system interpretation. If 7PM-7AM, please contact night-coverage [email or web address]

## 2023-02-23 NOTE — Plan of Care (Signed)

## 2023-02-23 NOTE — TOC Transition Note (Signed)
Transition of Care [facility]) - CM/SW Discharge Note   Patient Details  Name: Anne Rich MRN: [id number] Date of Birth: 1950/10/12  Transition of Care [facility]) CM/SW Contact:  Lawerance Sabal, RN Phone Number: 02/23/2023, 11:02 AM   Clinical Narrative:     Sherron Monday w patient over the phone. She is agreeable to [facility] services. No preference for agency. Referral accepted by [facility]. Patient said that her home phone rings low so she requested if the [facility] agency to LVM if she does not answer and she will call back, liaison aware.   Final next level of care: Home w Home Health Services Barriers to Discharge: No Barriers Identified   Patient Goals and CMS Choice CMS Medicare.gov Compare Post Acute Care list provided to:: Patient Choice offered to / list presented to : Patient  Discharge Placement                         Discharge Plan and Services Additional resources added to the After Visit Summary for                  DME Arranged: N/A         HH Arranged: PT, OT HH Agency: [facility] Date [facility] Agency Contacted: 02/23/23 Time HH Agency Contacted: 1101 Representative spoke with at [facility] Agency: Elnita Maxwell  Social Determinants of Health (SDOH) Interventions SDOH Screenings   Food Insecurity: No Food Insecurity (02/22/2023)  Housing: Low Risk  (02/22/2023)  Transportation Needs: No Transportation Needs (02/22/2023)  Utilities: Not At Risk (02/22/2023)  Tobacco Use: Medium Risk (02/19/2023)     Readmission Risk Interventions     No data to display

## 2023-02-26 ENCOUNTER — Other Ambulatory Visit: Payer: Self-pay

## 2023-02-26 ENCOUNTER — Encounter: Payer: Self-pay | Admitting: Allergy & Immunology

## 2023-02-26 ENCOUNTER — Ambulatory Visit (INDEPENDENT_AMBULATORY_CARE_PROVIDER_SITE_OTHER): Payer: Medicare Other | Admitting: Allergy & Immunology

## 2023-02-26 VITALS — BP 134/86 | HR 84 | Temp 98.0°F | Resp 16

## 2023-02-26 DIAGNOSIS — K219 Gastro-esophageal reflux disease without esophagitis: Secondary | ICD-10-CM | POA: Diagnosis not present

## 2023-02-26 DIAGNOSIS — Z9981 Dependence on supplemental oxygen: Secondary | ICD-10-CM

## 2023-02-26 DIAGNOSIS — J4489 Other specified chronic obstructive pulmonary disease: Secondary | ICD-10-CM | POA: Diagnosis not present

## 2023-02-26 DIAGNOSIS — J3089 Other allergic rhinitis: Secondary | ICD-10-CM

## 2023-02-26 DIAGNOSIS — Z7952 Long term (current) use of systemic steroids: Secondary | ICD-10-CM
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
## 2023-02-26 NOTE — Patient Instructions (Addendum)
Moderate persistent asthma with COPD overlap:  - Lung function looked stable today.  - We are going to work on changing from Xolair to [employer] (unfortunately, I do not have a handout for this new injectable medication). - We may add on a new nebulized medication called ensifentrine, but I am not sure how to order it right now. - It is another medication to help with keeping your lungs relaxed and open.  - Daily controller medication(s):       AM: Pulmicort (budesonide 0.5mg ) + Brovana (aformoterol) + Yupelri + Dalisrep + prednisone 10mg       PM: Pulmicort (budesonide 0.5mg ) + Brovana (aformoterol)       EVERY TWO WEEKS: Xolair 150mg  - Rescue medications: Xopenex (levalbuterol) + Atrovent (ipratropium) mixed together every 4-6 hours as needed via nebulizer - Asthma control goals:  * Full participation in all desired activities (may need albuterol before activity) * Albuterol use two time or less a week on average (not counting use with activity) * Cough interfering with sleep two time or less a month * Oral steroids no more than once a year * No hospitalizations * Minimize medical procedures   2. Allergic rhinitis - Continue with montelukast 10mg  daily.  - Continue with levocetirizine 5mg  daily   3. Return in about 6 weeks (around 04/09/2023).    Please inform Anne Rich of any Emergency Department visits, hospitalizations, or changes in symptoms. Call Anne Rich before going to the ED for breathing or allergy symptoms since we might be able to fit you in for a sick visit. Feel free to contact Anne Rich anytime with any questions, problems, or concerns.  It was a pleasure to see you again today!  Websites that have reliable patient information: 1. American Academy of Asthma, Allergy, and Immunology: www.aaaai.org 2. Food Allergy Research and Education (FARE): foodallergy.org 3. Mothers of Asthmatics: http://www.asthmacommunitynetwork.org 4. American College of Allergy, Asthma, and Immunology:  www.acaai.org   COVID-19 Vaccine Information can be found at: [email or web address].[email or web address] For questions related to vaccine distribution or appointments, please email [email or web address] or call [phone].   We realize that you might be concerned about having an allergic reaction to the COVID19 vaccines. To help with that concern, WE ARE OFFERING THE COVID19 VACCINES IN OUR OFFICE! Ask the front desk for dates!     "Like" Anne Rich on Facebook and Instagram for our latest updates!      A healthy democracy works best when [employer] participate! Make sure you are registered to vote! If you have moved or changed any of your contact information, you will need to get this updated before voting!  In some cases, you MAY be able to register to vote online: [email or web address]

## 2023-02-27 ENCOUNTER — Telehealth: Payer: Self-pay | Admitting: Allergy & Immunology

## 2023-02-27 MED ORDER — CLARITHROMYCIN 500 MG PO TABS: 500.0000 mg | ORAL_TABLET | Freq: Two times a day (BID) | ORAL | 0 refills | Status: DC

## 2023-02-27 MED ORDER — DOXYCYCLINE MONOHYDRATE 100 MG PO TABS: 100.0000 mg | ORAL_TABLET | Freq: Two times a day (BID) | ORAL | 0 refills | Status: DC

## 2023-02-27 NOTE — Telephone Encounter (Signed)
Patient forgot to mention some of her symptoms that she has been experiencing at visit yesterday.   She states she has been coughing even with being on oxygen and has had green stuff come out with coughing. Patient states she was fine until she went to the hospital, patient states she was around other people who were sick.  Patient is requesting a call back, [phone]

## 2023-02-27 NOTE — Telephone Encounter (Signed)
I sent in Biaxin twice daily for ten days. She was recently on doxycycline, so I did not want to repeat that. Can someone call and update her?  Malachi Bonds, MD [facility]

## 2023-02-28 ENCOUNTER — Telehealth: Payer: Self-pay | Admitting: *Deleted

## 2023-02-28 NOTE — Telephone Encounter (Signed)
-----   Message from Alfonse Spruce sent at 02/26/2023  6:29 PM EDT ----- Change to Tezspire?

## 2023-02-28 NOTE — Telephone Encounter (Signed)
L/m for patient to contact me to advise can schedule for Tezspire start and buy and bill like we did for XOlair as long as Ins remains the same

## 2023-02-28 NOTE — Telephone Encounter (Signed)
Patient called back stating she called yesterday about the symptoms she was having. Patient states she never received a call back. Patient has been notified of the antibiotic that was sent into the pharmacy.

## 2023-03-04 ENCOUNTER — Other Ambulatory Visit: Payer: Self-pay

## 2023-03-04 NOTE — Progress Notes (Unsigned)
Per Dr. Dellis Anes please send in Ensifentrine nebulizer medication. Ensifentrine has been ordered and pend

## 2023-03-05 ENCOUNTER — Ambulatory Visit: Payer: Medicare Other

## 2023-03-05 MED ORDER — ENSIFENTRINE 3 MG/2.5ML IN SUSP: 3.0000 mg | Freq: Two times a day (BID) | RESPIRATORY_TRACT | 11 refills | Status: DC

## 2023-03-07 ENCOUNTER — Emergency Department (HOSPITAL_BASED_OUTPATIENT_CLINIC_OR_DEPARTMENT_OTHER)
Admission: EM | Admit: 2023-03-07 | Discharge: 2023-03-07 | Disposition: A | Discharge status: Home / Self Care | Payer: Medicare Other | Attending: Emergency Medicine | Admitting: Emergency Medicine

## 2023-03-07 ENCOUNTER — Other Ambulatory Visit: Payer: Self-pay

## 2023-03-07 ENCOUNTER — Emergency Department (HOSPITAL_BASED_OUTPATIENT_CLINIC_OR_DEPARTMENT_OTHER): Payer: Medicare Other

## 2023-03-07 ENCOUNTER — Telehealth: Payer: Self-pay

## 2023-03-07 DIAGNOSIS — Z8673 Personal history of transient ischemic attack (TIA), and cerebral infarction without residual deficits: Secondary | ICD-10-CM | POA: Diagnosis not present

## 2023-03-07 DIAGNOSIS — J449 Chronic obstructive pulmonary disease, unspecified: Secondary | ICD-10-CM | POA: Insufficient documentation

## 2023-03-07 DIAGNOSIS — E871 Hypo-osmolality and hyponatremia: Secondary | ICD-10-CM | POA: Insufficient documentation

## 2023-03-07 DIAGNOSIS — R4182 Altered mental status, unspecified: Secondary | ICD-10-CM | POA: Diagnosis not present

## 2023-03-07 DIAGNOSIS — R7889 Finding of other specified substances, not normally found in blood: Secondary | ICD-10-CM

## 2023-03-07 DIAGNOSIS — R42 Dizziness and giddiness: Secondary | ICD-10-CM | POA: Insufficient documentation

## 2023-03-07 DIAGNOSIS — R400 Somnolence: Secondary | ICD-10-CM | POA: Diagnosis not present

## 2023-03-07 DIAGNOSIS — Z87891 Personal history of nicotine dependence: Secondary | ICD-10-CM | POA: Diagnosis not present

## 2023-03-07 DIAGNOSIS — R892 Abnormal level of other drugs, medicaments and biological substances in specimens from other organs, systems and tissues: Secondary | ICD-10-CM | POA: Insufficient documentation

## 2023-03-07 DIAGNOSIS — R569 Unspecified convulsions: Secondary | ICD-10-CM | POA: Diagnosis not present

## 2023-03-07 LAB — CBG MONITORING, ED: Glucose-Capillary: 187 mg/dL — ABNORMAL HIGH (ref 70–99)

## 2023-03-07 LAB — CBC WITH DIFFERENTIAL/PLATELET
Abs Immature Granulocytes: 0.05 10*3/uL (ref 0.00–0.07)
Basophils Absolute: 0 10*3/uL (ref 0.0–0.1)
Basophils Relative: 0 %
Eosinophils Absolute: 0 10*3/uL (ref 0.0–0.5)
Eosinophils Relative: 0 %
HCT: 32.2 % — ABNORMAL LOW (ref 36.0–46.0)
Hemoglobin: 10.9 g/dL — ABNORMAL LOW (ref 12.0–15.0)
Immature Granulocytes: 0 %
Lymphocytes Relative: 6 %
Lymphs Abs: 0.8 10*3/uL (ref 0.7–4.0)
MCH: 30.5 pg (ref 26.0–34.0)
MCHC: 33.9 g/dL (ref 30.0–36.0)
MCV: 90.2 fL (ref 80.0–100.0)
Monocytes Absolute: 0.6 10*3/uL (ref 0.1–1.0)
Monocytes Relative: 5 %
Neutro Abs: 11.5 10*3/uL — ABNORMAL HIGH (ref 1.7–7.7)
Neutrophils Relative %: 89 %
Platelets: 446 10*3/uL — ABNORMAL HIGH (ref 150–400)
RBC: 3.57 MIL/uL — ABNORMAL LOW (ref 3.87–5.11)
RDW: 12.3 % (ref 11.5–15.5)
WBC: 13 10*3/uL — ABNORMAL HIGH (ref 4.0–10.5)
nRBC: 0 % (ref 0.0–0.2)

## 2023-03-07 LAB — COMPREHENSIVE METABOLIC PANEL
ALT: 17 U/L (ref 0–44)
AST: 17 U/L (ref 15–41)
Albumin: 3.2 g/dL — ABNORMAL LOW (ref 3.5–5.0)
Alkaline Phosphatase: 71 U/L (ref 38–126)
Anion gap: 11 (ref 5–15)
BUN: 8 mg/dL (ref 8–23)
CO2: 27 mmol/L (ref 22–32)
Calcium: 8.4 mg/dL — ABNORMAL LOW (ref 8.9–10.3)
Chloride: 88 mmol/L — ABNORMAL LOW (ref 98–111)
Creatinine, Ser: 0.66 mg/dL (ref 0.44–1.00)
GFR, Estimated: >60 mL/min (ref >60)
Glucose, Bld: 129 mg/dL — ABNORMAL HIGH (ref 70–99)
Potassium: 4.3 mmol/L (ref 3.5–5.1)
Sodium: 126 mmol/L — ABNORMAL LOW (ref 135–145)
Total Bilirubin: 0.2 mg/dL — ABNORMAL LOW (ref 0.3–1.2)
Total Protein: 6.4 g/dL — ABNORMAL LOW (ref 6.5–8.1)

## 2023-03-07 LAB — T4, FREE: Free T4: 0.77 ng/dL (ref 0.61–1.12)

## 2023-03-07 LAB — TSH: TSH: 0.28 u[IU]/mL — ABNORMAL LOW (ref 0.350–4.500)

## 2023-03-07 LAB — URINALYSIS, ROUTINE W REFLEX MICROSCOPIC
Bilirubin Urine: NEGATIVE
Glucose, UA: NEGATIVE mg/dL
Hgb urine dipstick: NEGATIVE
Ketones, ur: NEGATIVE mg/dL
Leukocytes,Ua: NEGATIVE
Nitrite: NEGATIVE
Protein, ur: NEGATIVE mg/dL
Specific Gravity, Urine: 1.02 (ref 1.005–1.030)
pH: 8.5 — ABNORMAL HIGH (ref 5.0–8.0)

## 2023-03-07 LAB — MAGNESIUM: Magnesium: 1.9 mg/dL (ref 1.7–2.4)

## 2023-03-07 LAB — CARBAMAZEPINE LEVEL, TOTAL: Carbamazepine Lvl: 19.8 ug/mL (ref 4.0–12.0)

## 2023-03-07 MED ORDER — ONDANSETRON HCL 4 MG/2ML IJ SOLN
4.0000 mg | Freq: Once | INTRAMUSCULAR | Status: AC
  Administered 2023-03-07: 4 mg via INTRAVENOUS
  Filled 2023-03-07: qty 2

## 2023-03-07 MED ORDER — SODIUM CHLORIDE 0.9 % IV BOLUS
500.0000 mL | Freq: Once | INTRAVENOUS | Status: AC
  Administered 2023-03-07: 500 mL via INTRAVENOUS

## 2023-03-07 MED ORDER — SODIUM CHLORIDE 1 G PO TABS
1.0000 g | ORAL_TABLET | Freq: Two times a day (BID) | ORAL | 0 refills | Status: AC
Start: 2023-03-07 — End: 2023-03-12

## 2023-03-07 MED ORDER — MECLIZINE HCL 25 MG PO TABS
25.0000 mg | ORAL_TABLET | Freq: Once | ORAL | Status: AC
  Administered 2023-03-07: 25 mg via ORAL
  Filled 2023-03-07: qty 1

## 2023-03-07 MED ORDER — MECLIZINE HCL 25 MG PO TABS: 25.0000 mg | ORAL_TABLET | Freq: Two times a day (BID) | ORAL | 0 refills | Status: AC | PRN

## 2023-03-07 NOTE — Telephone Encounter (Signed)
I called the patient to see what other concerns she wanted to discuss with Dr. Dellis Anes. Patient say her PCP yesterday and to them about the falling, dizziness, blurry vision, and body shakes. She was told it was possbly due to low sodium levels, which she deals with on/off and was told to go back in 2 weeks. She is not sure what to do or who to see. She went to the hospital and stayed there for two days they thought she may have suffered from a stroke or had a head injury, but the MRI cam back normal.    Please advise what test or bloodwork she should ask for before from the ED.

## 2023-03-07 NOTE — ED Provider Notes (Signed)
EMERGENCY DEPARTMENT AT [facility] Provider Note  CSN: [id number] Arrival date & time: 03/07/23 1118  Chief Complaint(s) Dizziness and Gait Problem  HPI Anne Rich is a 72 y.o. female with past medical history as below, significant for COPD, vertigo, asthma, migraine, CVA, seizure, post-polio syndrome, hyponatremia who presents to the ED with complaint of dizziness, difficulty walking  She has recurrent dizziness, recurrent weakness  She was seen by neurology yesterday Dr Antonietta Barcelona; they are attempting to reduce carbamazepine, considering repeat EEG.  Weakness favored secondary to hyponatremia versus postpolio.  Recurrent dizziness possibly secondary to carbamazepine versus hyponatremia, Orthostasis.  Dysarthria favored to be polypharmacy.  Patient comes to the ER today secondary to concern for similar symptoms that she was evaluated for yesterday  Patient here with fatigue, blurry vision, room spinning sensation/dizziness, nausea.  Reports symptoms unchanged from prior but she is here because she is not getting any better.  No new symptoms today.  She is requesting nausea medication.  She thinks she may have vomited yesterday but is not sure.  She had a fall this morning, walker slipped out from under her and she fell onto her backside.  No new pain from the fall.  Spouse helped her up, she has been ambulatory with her walker since then without any change to her baseline mobility  Past Medical History Past Medical History:  Diagnosis Date   Allergic rhinitis    Asthma    COPD (chronic obstructive pulmonary disease) (HCC)    Patient Active Problem List   Diagnosis Date Noted   Dysarthria 02/22/2023   Hyponatremia 02/22/2023   Long-term corticosteroid use 02/19/2023   Dizziness 02/19/2023   Severe persistent asthma with acute exacerbation 02/05/2023   Seasonal allergic conjunctivitis 02/05/2023   At increased risk of exposure to COVID-19 virus 05/07/2022   Cough  05/07/2022   Current mild episode of major depressive disorder (HCC) 05/23/2021   Ecchymosis 07/27/2020   COVID-19 virus infection 06/20/2019   Medication management 05/29/2019   Severe persistent asthma without complication 03/16/2019   Physical deconditioning 01/06/2019   Counseling regarding end of life decision making 01/06/2019   Lumbosacral dysfunction 09/04/2018   Chronic respiratory failure with hypoxia (HCC), 3L home O2 05/01/2018   Statin intolerance 02/13/2018   Adverse effect of other drugs, medicaments and biological substances, initial encounter 01/01/2018   Bilateral carotid artery stenosis 10/14/2017   Spells of decreased attentiveness 09/10/2017   Cortical age-related cataract of left eye 10/24/2016   Nuclear sclerotic cataract of left eye 10/24/2016   History of epistaxis 07/19/2016   Advance directive in chart 04/04/2016   Idiopathic peripheral neuropathy 03/12/2016   Loss of appetite 03/12/2016   Psychophysiological insomnia 03/12/2016   Oxygen dependent 02/28/2016   Easy bruising 12/26/2015   Gastroesophageal reflux disease 12/02/2015   Coronary artery calcification seen on CT scan 09/20/2015   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HCC) 09/20/2015   Abnormal weight loss 09/06/2015   Herniated lumbar intervertebral disc 07/21/2015   Migraine without aura and without status migrainosus, not intractable 07/21/2015   Anxiety 06/28/2015   Chronic constipation 06/28/2015   Chronic use of benzodiazepine for therapeutic purpose 06/28/2015   Former smoker 06/28/2015   Hepatic steatosis 06/28/2015   History of cerebrovascular accident (CVA) with residual deficit 06/28/2015   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   Hypoxemia 06/28/2015   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/28/2015   Osteopenia 06/28/2015   Unspecified convulsions (HCC) 06/28/2015   Acute poliomyelitis, unspecified 04/18/2015   CVA (cerebral vascular accident) (  HCC) 04/18/2015   Essential hypertension 04/18/2015    Perennial allergic rhinitis 02/10/2015   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 Authorizing Provider  meclizine (ANTIVERT) 25 MG tablet Take 1 tablet (25 mg total) by mouth 2 (two) times daily as needed for dizziness. 03/07/23  Yes Tanda Rockers A, DO  sodium chloride 1 g tablet Take 1 tablet (1 g total) by mouth 2 (two) times daily with a meal for 5 days. 03/07/23 03/12/23 Yes Tanda Rockers A, DO  albuterol (PROVENTIL) (2.5 MG/3ML) 0.083% nebulizer solution USE 1 VIAL IN NEBULIZER EVERY 4 TO 6 HOURS AS NEEDED FOR COUGH AND/OR WHEEZE.  Generic:  Ventolin 02/15/23   Alfonse Spruce, MD  albuterol (VENTOLIN HFA) 108 (90 Base) MCG/ACT inhaler Inhale 2 puffs into the lungs every 4 (four) hours as needed for wheezing or shortness of breath. 10/30/22   Alfonse Spruce, MD  arformoterol (BROVANA) 15 MCG/2ML NEBU Take 2 mLs (15 mcg total) by nebulization 2 (two) times daily. 06/14/22   Mannam, Colbert Coyer, MD  budesonide (PULMICORT) 0.5 MG/2ML nebulizer solution USE 1 VIAL  IN  NEBULIZER TWICE  DAILY - rinse mouth after treatment 10/30/22   Alfonse Spruce, MD  carbamazepine (TEGRETOL) 200 MG tablet Take 200-400 mg by mouth 2 (two) times daily. 200mg  tab in the morning and 400mg  at night    [provider]  clarithromycin (BIAXIN) 500 MG tablet Take 1 tablet (500 mg total) by mouth 2 (two) times daily for 10 days. 02/27/23 03/09/23  Alfonse Spruce, MD  cromolyn (OPTICROM) 4 % ophthalmic solution Place 2 drops into both eyes 4 (four) times daily as  needed. 02/05/23   Hetty Blend, FNP  cyclobenzaprine (FLEXERIL) 5 MG tablet Take 5 mg by mouth daily.    [provider]  diclofenac sodium (VOLTAREN) 1 % GEL Apply 2 g topically 4 (four) times daily as needed (knee pain).  11/10/18   [provider]  doxycycline (VIBRAMYCIN) 100 MG capsule Take 1 capsule (100 mg total) by mouth 2 (two) times daily. 02/08/23   Peter Garter, PA  Ensifentrine 3 MG/2.5ML SUSP Inhale 3 mg into the lungs in the morning and at bedtime. 03/05/23   Alfonse Spruce, MD  EPINEPHrine (EPIPEN 2-PAK) 0.3 mg/0.3 mL IJ SOAJ injection Inject 0.3 mg into the muscle Once PRN. 03/14/20   Alfonse Spruce, MD  estradiol (ESTRACE) 1 MG tablet Take 1 mg by mouth daily. 11/19/17   [provider]  gabapentin (NEURONTIN) 600 MG tablet Take 600-1,200 mg by mouth 2 (two) times daily. 600mg  in the morning and 1200mg  at bedtime 11/25/18   [provider]  ipratropium (ATROVENT) 0.02 % nebulizer solution Take 2.5 mLs (0.5 mg total) by nebulization every 4 (four) hours as needed for wheezing or shortness of breath. 04/13/22   Alfonse Spruce, MD  levalbuterol Pauline Aus) 0.63 MG/3ML nebulizer solution Take 3 mLs (0.63 mg total) by nebulization every 4 (four) hours as needed for wheezing or shortness of breath. 05/18/22   Alfonse Spruce, MD  levETIRAcetam (  KEPPRA) 1000 MG tablet Take 1,000-2,000 mg by mouth 2 (two) times daily. 1000mg  in the morning and 2000mg  at night    [provider]  levocetirizine (XYZAL) 5 MG tablet Take 1 tablet (5 mg total) by mouth daily as needed for allergies (for runny nose). 02/05/23   Hetty Blend, FNP  lisinopril (PRINIVIL,ZESTRIL) 10 MG tablet Take 20 mg by mouth daily. 12/10/17   [provider]  LORazepam (ATIVAN) 1 MG tablet Take 1 mg by mouth 3 (three) times daily. 03/08/22   [provider]  Melatonin 10 MG TABS Take 10 mg by mouth daily.    [provider]  mirtazapine  (REMERON) 30 MG tablet Take 30 mg by mouth every evening.  12/06/18   [provider]  montelukast (SINGULAIR) 10 MG tablet TAKE 1 TABLET BY MOUTH EVERYDAY AT BEDTIME 02/08/23   Ambs, Norvel Richards, FNP  Multiple Vitamin (MULTIVITAMIN) tablet Take 1 tablet by mouth daily.     [provider]  omeprazole (PRILOSEC) 40 MG capsule Take 40 mg by mouth 2 (two) times a day.    [provider]  ondansetron (ZOFRAN) 4 MG tablet TAKE 1 TABLET (4 MG TOTAL) BY MOUTH 2 (TWO) TIMES DAILY AS NEEDED FOR NAUSEA. 12/26/21   Mannam, Colbert Coyer, MD  oxybutynin (DITROPAN) 5 MG tablet Take by mouth. 02/16/21   [provider]  OXYGEN Inhale 3 L into the lungs at bedtime. 4 L when walking    [provider]  predniSONE (DELTASONE) 10 MG tablet Take 10 mg by mouth daily with breakfast.    [provider]  predniSONE (STERAPRED UNI-PAK 21 TAB) 10 MG (21) TBPK tablet Take by mouth daily. Take 6 tabs by mouth daily  for 2 days, then 5 tabs for 2 days, then 4 tabs for 2 days, then 3 tabs for 2 days, 2 tabs for 2 days, then 1 tab by mouth daily for 2 days 02/08/23   Sherian Maroon A, PA  QUEtiapine (SEROQUEL) 25 MG tablet Take 25-50 mg by mouth at bedtime. 09/05/21   [provider]  revefenacin (YUPELRI) 175 MCG/3ML nebulizer solution Inhale 3 mLs (175 mcg total) into the lungs daily. 06/14/22   Mannam, Colbert Coyer, MD  roflumilast (DALIRESP) 500 MCG TABS tablet TAKE 1 TABLET DAILY 02/23/23   Mannam, Praveen, MD  TESSALON PERLES 100 MG capsule Take 1 capsule (100 mg total) by mouth 3 (three) times daily as needed for cough. 07/25/22   Alfonse Spruce, MD                                                                                                                                    Past Surgical History Past Surgical History:  Procedure Laterality Date   ABDOMINAL HYSTERECTOMY     APPENDECTOMY     CESAREAN SECTION     Family History Family History  Problem Relation Age of  Onset  Allergic rhinitis Neg Hx    Angioedema Neg Hx    Asthma Neg Hx    Eczema Neg Hx    Immunodeficiency Neg Hx    Urticaria Neg Hx     Social History Social History   Tobacco Use   Smoking status: Former    Current packs/day: 0.00    Average packs/day: 0.5 packs/day for 15.0 years (7.5 ttl pk-yrs)    Types: Cigarettes    Start date: 06/12/1979    Quit date: 06/11/1994    Years since quitting: 28.7    Passive exposure: Never   Smokeless tobacco: Never  Vaping Use   Vaping status: Never Used  Substance Use Topics   Alcohol use: No   Drug use: No   Allergies Iodine, Iodine-131, Penicillins, Pineapple, Shellfish allergy, Molds & smuts, Pravastatin, Shellfish-derived products, Xanax [alprazolam], and Iodinated contrast media  Review of Systems Review of Systems  Constitutional:  Positive for fatigue. Negative for chills.  Eyes:  Positive for visual disturbance.  Respiratory:  Negative for shortness of breath.   Cardiovascular:  Negative for chest pain.  Gastrointestinal:  Positive for nausea. Negative for abdominal pain.  Genitourinary:  Negative for urgency.  Musculoskeletal:  Positive for gait problem.  Neurological:  Positive for weakness.  All other systems reviewed and are negative.   Physical Exam Vital Signs  I have reviewed the triage vital signs BP 129/64   Pulse 86   Temp 98.2 F (36.8 C)   Resp 16   Wt 75.1 kg   SpO2 99%   BMI 25.16 kg/m  Physical Exam Vitals and nursing note reviewed.  Constitutional:      General: She is not in acute distress.    Appearance: Normal appearance.  HENT:     Head: Normocephalic and atraumatic. No raccoon eyes, Battle's sign, right periorbital erythema or left periorbital erythema.     Comments: No external evidence of head trauma    Right Ear: External ear normal.     Left Ear: External ear normal.     Nose: Nose normal.     Mouth/Throat:     Mouth: Mucous membranes are moist.  Eyes:     General: No scleral  icterus.       Right eye: No discharge.        Left eye: No discharge.     Extraocular Movements: Extraocular movements intact.     Pupils: Pupils are equal, round, and reactive to light.  Cardiovascular:     Rate and Rhythm: Normal rate and regular rhythm.     Pulses: Normal pulses.     Heart sounds: Normal heart sounds.     No S3 or S4 sounds.  Pulmonary:     Effort: Pulmonary effort is normal. No respiratory distress.     Breath sounds: Normal breath sounds. No stridor.  Abdominal:     General: Abdomen is flat. There is no distension.     Palpations: Abdomen is soft.     Tenderness: There is no abdominal tenderness. There is no guarding or rebound.  Musculoskeletal:     Cervical back: No rigidity.     Right lower leg: No edema.     Left lower leg: No edema.  Skin:    General: Skin is warm and dry.     Capillary Refill: Capillary refill takes less than 2 seconds.  Neurological:     Mental Status: She is alert and oriented to person, place, and time.     GCS:  GCS eye subscore is 4. GCS verbal subscore is 5. GCS motor subscore is 6.     Cranial Nerves: Cranial nerves 2-12 are intact. No facial asymmetry.     Sensory: Sensation is intact. No sensory deficit.     Motor: Motor function is intact. No weakness.     Coordination: Coordination is intact. Finger-Nose-Finger Test normal.     Comments: Gait testing deferred secondary to patient safety.  Strength 5/5 bilateral upper and lower extremities symmetric  Variable compliance with neurologic testing  Psychiatric:        Mood and Affect: Mood normal.        Behavior: Behavior normal. Behavior is cooperative.     ED Results and Treatments Labs (all labs ordered are listed, but only abnormal results are displayed) Labs Reviewed  CBC WITH DIFFERENTIAL/PLATELET - Abnormal; Notable for the following components:      Result Value   WBC 13.0 (*)    RBC 3.57 (*)    Hemoglobin 10.9 (*)    HCT 32.2 (*)    Platelets 446 (*)     Neutro Abs 11.5 (*)    All other components within normal limits  COMPREHENSIVE METABOLIC PANEL - Abnormal; Notable for the following components:   Sodium 126 (*)    Chloride 88 (*)    Glucose, Bld 129 (*)    Calcium 8.4 (*)    Total Protein 6.4 (*)    Albumin 3.2 (*)    Total Bilirubin 0.2 (*)    All other components within normal limits  URINALYSIS, ROUTINE W REFLEX MICROSCOPIC - Abnormal; Notable for the following components:   pH 8.5 (*)    All other components within normal limits  CARBAMAZEPINE LEVEL, TOTAL - Abnormal; Notable for the following components:   Carbamazepine Lvl 19.8 (*)    All other components within normal limits  CBG MONITORING, ED - Abnormal; Notable for the following components:   Glucose-Capillary 187 (*)    All other components within normal limits  MAGNESIUM  TSH  T4, FREE                                                                                                                          Radiology CT Head Wo Contrast  Result Date: 03/07/2023 CLINICAL DATA:  vertigo EXAM: CT HEAD WITHOUT CONTRAST TECHNIQUE: Contiguous axial images were obtained from the base of the skull through the vertex without intravenous contrast. RADIATION DOSE REDUCTION: This exam was performed according to the departmental dose-optimization program which includes automated exposure control, adjustment of the mA and/or kV according to patient size and/or use of iterative reconstruction technique. COMPARISON:  CT and MRI of the head 02/22/2023. FINDINGS: Brain: No evidence of acute infarction, hemorrhage, hydrocephalus, extra-axial collection or mass lesion/mass effect. Patchy white matter hypodensities, nonspecific but compatible with chronic microvascular ischemic change. Vascular: No hyperdense vessel identified. Calcific atherosclerosis. Skull: No acute fracture. Sinuses/Orbits: No acute finding. Other: No mastoid effusions. IMPRESSION: No evidence  of acute intracranial  abnormality. Electronically Signed   By: Feliberto Harts M.D.   On: 03/07/2023 14:55    Pertinent labs & imaging results that were available during my care of the patient were reviewed by me and considered in my medical decision making (see MDM for details).  Medications Ordered in ED Medications  ondansetron (ZOFRAN) injection 4 mg (4 mg Intravenous Given 03/07/23 1324)  sodium chloride 0.9 % bolus 500 mL (0 mLs Intravenous Stopped 03/07/23 1459)  meclizine (ANTIVERT) tablet 25 mg (25 mg Oral Given 03/07/23 1324)                                                                                                                                     Procedures Procedures  (including critical care time)  Medical Decision Making / ED Course    Medical Decision Making:    Anne Rich is a 72 y.o. female with past medical history as below, significant for COPD, vertigo, asthma, migraine, CVA, seizure, post-polio syndrome, hyponatremia who presents to the ED with complaint of dizziness, difficulty walking. The complaint involves an extensive differential diagnosis and also carries with it a high risk of complications and morbidity.  Serious etiology was considered. Ddx includes but is not limited to: Orthostatics, medication effect, electrolyte derangement, metabolic derangement, infection, vertigo, etc.  Complete initial physical exam performed, notably the patient  was no acute distress, asking for nausea medication, exam stable, she is tachycardic.    Reviewed and confirmed nursing documentation for past medical history, family history, social history.  Vital signs reviewed.    Clinical Course as of 03/07/23 1556  Thu Mar 07, 2023  1429 Sodium(!): 126 Sodium was 134 twelve days ago, she has recurrent hyponatremia.  No mental status changes, no seizures. Likely 2/2 elev tegretol  [SG]  1510 Carbamazepine (Tegretol), S(!!): 19.8 Likely contributing to hyponatremia, dizzy [SG]    Clinical  Course User Index [SG] Tanda Rockers A, DO    Recurrent/chronic hyponatremia; given NS bolus here.  She is tolerant p.o. intake with difficulty.  Dizziness and nausea has resolved.  She does have recurrent dizziness and recurrent nausea.  Her symptoms are essentially unchanged from baseline.  Observation was offered given elevated Tegretol level and hyponatremia, she prefers to go home and follow-up with neurologist and PCP.  Will start on salt tablets for home.  Dietary adjustments.  Continue Tegretol taper initiated by neurology yesterday.  Follow-up with PCP in the next few days for recheck of sodium.   She is feeling much better on recheck. Requesting discharge.    The patient improved significantly and was discharged in stable condition. Detailed discussions were had with the patient regarding current findings, and need for close f/u with PCP or on call doctor. The patient has been instructed to return immediately if the symptoms worsen in any way for re-evaluation. Patient verbalized understanding and is in agreement with current care  plan. All questions answered prior to discharge.                   Additional history obtained: -Additional history obtained from spouse -External records from outside source obtained and reviewed including: Chart review including previous notes, labs, imaging, consultation notes including  Prior admission, medications, prior labs and imaging   Lab Tests: -I ordered, reviewed, and interpreted labs.   The pertinent results include:   Labs Reviewed  CBC WITH DIFFERENTIAL/PLATELET - Abnormal; Notable for the following components:      Result Value   WBC 13.0 (*)    RBC 3.57 (*)    Hemoglobin 10.9 (*)    HCT 32.2 (*)    Platelets 446 (*)    Neutro Abs 11.5 (*)    All other components within normal limits  COMPREHENSIVE METABOLIC PANEL - Abnormal; Notable for the following components:   Sodium 126 (*)    Chloride 88 (*)    Glucose, Bld  129 (*)    Calcium 8.4 (*)    Total Protein 6.4 (*)    Albumin 3.2 (*)    Total Bilirubin 0.2 (*)    All other components within normal limits  URINALYSIS, ROUTINE W REFLEX MICROSCOPIC - Abnormal; Notable for the following components:   pH 8.5 (*)    All other components within normal limits  CARBAMAZEPINE LEVEL, TOTAL - Abnormal; Notable for the following components:   Carbamazepine Lvl 19.8 (*)    All other components within normal limits  CBG MONITORING, ED - Abnormal; Notable for the following components:   Glucose-Capillary 187 (*)    All other components within normal limits  MAGNESIUM  TSH  T4, FREE    Notable for hyponatremia, elevated Tegretol  EKG   EKG Interpretation Date/Time:  Thursday March 07 2023 11:30:34 EDT Ventricular Rate:  101 PR Interval:  127 QRS Duration:  83 QT Interval:  344 QTC Calculation: 446 R Axis:   78  Text Interpretation: Sinus tachycardia Borderline T wave abnormalities Confirmed by Tanda Rockers ([id number]) on 03/07/2023 2:39:18 PM         Imaging Studies ordered: I ordered imaging studies including CT head I independently visualized the following imaging with scope of interpretation limited to determining acute life threatening conditions related to emergency care; findings noted above, significant for stable I independently visualized and interpreted imaging. I agree with the radiologist interpretation   Medicines ordered and prescription drug management: Meds ordered this encounter  Medications   ondansetron (ZOFRAN) injection 4 mg   sodium chloride 0.9 % bolus 500 mL   meclizine (ANTIVERT) tablet 25 mg   sodium chloride 1 g tablet    Sig: Take 1 tablet (1 g total) by mouth 2 (two) times daily with a meal for 5 days.    Dispense:  10 tablet    Refill:  0   meclizine (ANTIVERT) 25 MG tablet    Sig: Take 1 tablet (25 mg total) by mouth 2 (two) times daily as needed for dizziness.    Dispense:  10 tablet    Refill:  0    -I  have reviewed the patients home medicines and have made adjustments as needed   Consultations Obtained: Not applicable   Cardiac Monitoring: The patient was maintained on a cardiac monitor.  I personally viewed and interpreted the cardiac monitored which showed an underlying rhythm of: NSR  Social Determinants of Health:  Diagnosis or treatment significantly limited by social determinants of health:  former smoker   Reevaluation: After the interventions noted above, I reevaluated the patient and found that they have improved  Co morbidities that complicate the patient evaluation  Past Medical History:  Diagnosis Date   Allergic rhinitis    Asthma    COPD (chronic obstructive pulmonary disease) (HCC)       Dispostion: Disposition decision including need for hospitalization was considered, and patient discharged from emergency department.    Final Clinical Impression(s) / ED Diagnoses Final diagnoses:  Hyponatremia  Elevated Tegretol level  Dizziness        Sloan Leiter, DO 03/07/23 1556

## 2023-03-07 NOTE — ED Notes (Signed)
Fall risk sign on door Fall risk armband Patient wearing shoes

## 2023-03-07 NOTE — Telephone Encounter (Signed)
Patient verbalized understanding and plans to go to the ED today.

## 2023-03-07 NOTE — Discharge Instructions (Addendum)
Please follow up with your neurologist and PCP  You tegretol level was elevated, you were started on taper by your neurologist, please follow these recommendations and follow back up with neurology  Sodium was low again today, elevated tegretol is possibly contributing to this, please take salt tablets as prescribed and follow up with pcp for recheck in the next 3/5 days of your sodium level  It was a pleasure caring for you today in the emergency department.  Please return to the emergency department for any worsening or worrisome symptoms.

## 2023-03-07 NOTE — ED Notes (Signed)
Patient transported to CT 

## 2023-03-07 NOTE — Telephone Encounter (Signed)
Our speciality is certainly not balance and falls. This needs to be managed by her PCP or the ED. I am sure that the ED will do a thorough workup with the history of falls.   Anne Bonds, MD [facility]

## 2023-03-07 NOTE — ED Triage Notes (Signed)
Pt POv with husband-  C/o multiple falls, blurred vision started "ages ago"; recently seen for same.  Pt reports worsening balance.    Husband reports pt with emesis yesterday.   Reports compliance with meds, sees PT Gonzalez.

## 2023-03-07 NOTE — Telephone Encounter (Signed)
Patient called stating that she keeps having falling episodes. Patient said the walker is not helping her. She states she has seen her PCP and seen the Neurology provider on yesterday. She states she feels she is not being helped at all. She requested me to ask Dr. Dellis Anes if she should go to the ED to be seen. I spoke with Dr. Dellis Anes and received a verbal for her to go to the ED. Patient is going to go, but she wants a call directly from Dr. Dellis Anes regarding this issue.

## 2023-03-08 ENCOUNTER — Emergency Department (HOSPITAL_COMMUNITY): Payer: Medicare Other

## 2023-03-08 ENCOUNTER — Emergency Department (HOSPITAL_COMMUNITY)
Admission: EM | Admit: 2023-03-08 | Discharge: 2023-03-09 | Disposition: A | Discharge status: Home / Self Care | Payer: Medicare Other | Attending: Emergency Medicine | Admitting: Emergency Medicine

## 2023-03-08 DIAGNOSIS — R400 Somnolence: Secondary | ICD-10-CM

## 2023-03-08 DIAGNOSIS — R4701 Aphasia: Secondary | ICD-10-CM | POA: Diagnosis not present

## 2023-03-08 DIAGNOSIS — R9082 White matter disease, unspecified: Secondary | ICD-10-CM | POA: Diagnosis not present

## 2023-03-08 DIAGNOSIS — E871 Hypo-osmolality and hyponatremia: Secondary | ICD-10-CM | POA: Insufficient documentation

## 2023-03-08 DIAGNOSIS — R471 Dysarthria and anarthria: Secondary | ICD-10-CM | POA: Insufficient documentation

## 2023-03-08 DIAGNOSIS — R4781 Slurred speech: Secondary | ICD-10-CM | POA: Diagnosis not present

## 2023-03-08 DIAGNOSIS — G319 Degenerative disease of nervous system, unspecified: Secondary | ICD-10-CM | POA: Diagnosis not present

## 2023-03-08 DIAGNOSIS — J4489 Other specified chronic obstructive pulmonary disease: Secondary | ICD-10-CM | POA: Diagnosis not present

## 2023-03-08 DIAGNOSIS — J449 Chronic obstructive pulmonary disease, unspecified: Secondary | ICD-10-CM | POA: Diagnosis not present

## 2023-03-08 DIAGNOSIS — G9340 Encephalopathy, unspecified: Secondary | ICD-10-CM | POA: Diagnosis not present

## 2023-03-08 DIAGNOSIS — R569 Unspecified convulsions: Secondary | ICD-10-CM

## 2023-03-08 DIAGNOSIS — R4182 Altered mental status, unspecified: Secondary | ICD-10-CM | POA: Diagnosis present

## 2023-03-08 LAB — COMPREHENSIVE METABOLIC PANEL
ALT: 13 U/L (ref 0–44)
AST: 15 U/L (ref 15–41)
Albumin: 2.7 g/dL — ABNORMAL LOW (ref 3.5–5.0)
Alkaline Phosphatase: 59 U/L (ref 38–126)
Anion gap: 10 (ref 5–15)
BUN: 5 mg/dL — ABNORMAL LOW (ref 8–23)
CO2: 25 mmol/L (ref 22–32)
Calcium: 8 mg/dL — ABNORMAL LOW (ref 8.9–10.3)
Chloride: 92 mmol/L — ABNORMAL LOW (ref 98–111)
Creatinine, Ser: 0.63 mg/dL (ref 0.44–1.00)
GFR, Estimated: >60 mL/min (ref >60)
Glucose, Bld: 124 mg/dL — ABNORMAL HIGH (ref 70–99)
Potassium: 4.2 mmol/L (ref 3.5–5.1)
Sodium: 127 mmol/L — ABNORMAL LOW (ref 135–145)
Total Bilirubin: 0.2 mg/dL — ABNORMAL LOW (ref 0.3–1.2)
Total Protein: 5.3 g/dL — ABNORMAL LOW (ref 6.5–8.1)

## 2023-03-08 LAB — URINALYSIS, ROUTINE W REFLEX MICROSCOPIC
Bilirubin Urine: NEGATIVE
Glucose, UA: NEGATIVE mg/dL
Hgb urine dipstick: NEGATIVE
Ketones, ur: 5 mg/dL — AB
Leukocytes,Ua: NEGATIVE
Nitrite: NEGATIVE
Protein, ur: NEGATIVE mg/dL
Specific Gravity, Urine: 1.012 (ref 1.005–1.030)
pH: 7 (ref 5.0–8.0)

## 2023-03-08 LAB — I-STAT CHEM 8, ED
BUN: 4 mg/dL — ABNORMAL LOW (ref 8–23)
Calcium, Ion: 1.03 mmol/L — ABNORMAL LOW (ref 1.15–1.40)
Chloride: 92 mmol/L — ABNORMAL LOW (ref 98–111)
Creatinine, Ser: 0.6 mg/dL (ref 0.44–1.00)
Glucose, Bld: 123 mg/dL — ABNORMAL HIGH (ref 70–99)
HCT: 30 % — ABNORMAL LOW (ref 36.0–46.0)
Hemoglobin: 10.2 g/dL — ABNORMAL LOW (ref 12.0–15.0)
Potassium: 4.3 mmol/L (ref 3.5–5.1)
Sodium: 125 mmol/L — ABNORMAL LOW (ref 135–145)
TCO2: 24 mmol/L (ref 22–32)

## 2023-03-08 LAB — DIFFERENTIAL
Abs Immature Granulocytes: 0.05 10*3/uL (ref 0.00–0.07)
Basophils Absolute: 0 10*3/uL (ref 0.0–0.1)
Basophils Relative: 0 %
Eosinophils Absolute: 0 10*3/uL (ref 0.0–0.5)
Eosinophils Relative: 0 %
Immature Granulocytes: 0 %
Lymphocytes Relative: 6 %
Lymphs Abs: 0.7 10*3/uL (ref 0.7–4.0)
Monocytes Absolute: 0.7 10*3/uL (ref 0.1–1.0)
Monocytes Relative: 6 %
Neutro Abs: 10.6 10*3/uL — ABNORMAL HIGH (ref 1.7–7.7)
Neutrophils Relative %: 88 %

## 2023-03-08 LAB — CBG MONITORING, ED: Glucose-Capillary: 107 mg/dL — ABNORMAL HIGH (ref 70–99)

## 2023-03-08 LAB — APTT: aPTT: 29 s (ref 24–36)

## 2023-03-08 LAB — CBC
HCT: 30.3 % — ABNORMAL LOW (ref 36.0–46.0)
Hemoglobin: 10.1 g/dL — ABNORMAL LOW (ref 12.0–15.0)
MCH: 30.5 pg (ref 26.0–34.0)
MCHC: 33.3 g/dL (ref 30.0–36.0)
MCV: 91.5 fL (ref 80.0–100.0)
Platelets: 413 10*3/uL — ABNORMAL HIGH (ref 150–400)
RBC: 3.31 MIL/uL — ABNORMAL LOW (ref 3.87–5.11)
RDW: 12.5 % (ref 11.5–15.5)
WBC: 12.1 10*3/uL — ABNORMAL HIGH (ref 4.0–10.5)
nRBC: 0 % (ref 0.0–0.2)

## 2023-03-08 LAB — RAPID URINE DRUG SCREEN, HOSP PERFORMED
Amphetamines: NOT DETECTED
Barbiturates: NOT DETECTED
Benzodiazepines: POSITIVE — AB
Cocaine: NOT DETECTED
Opiates: NOT DETECTED
Tetrahydrocannabinol: NOT DETECTED

## 2023-03-08 LAB — CARBAMAZEPINE LEVEL, TOTAL: Carbamazepine Lvl: 18.8 ug/mL (ref 4.0–12.0)

## 2023-03-08 LAB — ETHANOL: Alcohol, Ethyl (B): <10 mg/dL (ref <10)

## 2023-03-08 LAB — PROTIME-INR
INR: 1 (ref 0.8–1.2)
Prothrombin Time: 13 s (ref 11.4–15.2)

## 2023-03-08 MED ORDER — LORAZEPAM 2 MG/ML IJ SOLN
0.5000 mg | Freq: Once | INTRAMUSCULAR | Status: AC
  Administered 2023-03-08: 0.5 mg via INTRAVENOUS
  Filled 2023-03-08: qty 1

## 2023-03-08 MED ORDER — ACETAMINOPHEN 500 MG PO TABS: 1000.0000 mg | ORAL_TABLET | Freq: Once | ORAL | Status: DC

## 2023-03-08 MED ORDER — CLARITHROMYCIN 500 MG PO TABS: 500.0000 mg | ORAL_TABLET | Freq: Two times a day (BID) | ORAL | 0 refills | Status: AC

## 2023-03-08 NOTE — Consult Note (Addendum)
Neurology Consultation  Reason for Consult: Code Stroke Referring Physician:   CC:  History is obtained from: patient, EMS personnel, and past medical records  HPI: Anne Rich is a 72 y.o. female with a past medical history of seizure, episodic weakness, dizziness, dysarthria, allergic rhinitis, asthma, COPD presenting with slurred speech, left drift, dizziness, at 1030. She was seen yesterday at [facility] ED yesterday for dizziness that did fully resolve prior to day.  On chart review she is followed closely with [facility] neurology team for dizziness and dysarthria that is this suspected to be a combination of orthostasis and medication effect. Her dizziness resolved yesterday and then came back this morning.  Her significant other noticed the left arm drift and slurred speech and called EMS.  Of note her Tegretol level is elevated at 19.8, TSH is 0.28, and sodium is 125.      LKW: 1030 TNK given?: No , stroke not suspected  Mechanical Thrombectomy? no Premorbid modified Rankin scale (mRS): 0 0-Completely asymptomatic and back to baseline post-stroke 1-No significant post stroke disability and can perform usual duties with stroke symptoms 2-Slight disability-UNABLE to perform all activities but does not need assistance  3-Moderate disability-requires help but walks WITHOUT assistance 4-Needs assistance to walk and tend to bodily needs 5-Severe disability-bedridden, incontinent, needs constant attention 6- Death ROS: Full ROS was performed and is negative except as noted in the HPI.  Unable to obtain due to altered mental status.   Past Medical History:  Diagnosis Date   Allergic rhinitis    Asthma    COPD (chronic obstructive pulmonary disease) (HCC)       Family History  Problem Relation Age of Onset   Allergic rhinitis Neg Hx    Angioedema Neg Hx    Asthma Neg Hx    Eczema Neg Hx    Immunodeficiency Neg Hx    Urticaria Neg Hx      Social History:    reports that she quit smoking about 28 years ago. Her smoking use included cigarettes. She started smoking about 43 years ago. She has a 7.5 pack-year smoking history. She has never been exposed to tobacco smoke. She has never used smokeless tobacco. She reports that she does not drink alcohol and does not use drugs.  Medications  Current Facility-Administered Medications:    dupilumab (DUPIXENT) prefilled syringe 600 mg, 600 mg, Subcutaneous, Q14 Days, Alfonse Spruce, MD, 600 mg at 06/06/22 6606   omalizumab Geoffry Paradise) prefilled syringe 150 mg, 150 mg, Subcutaneous, Q14 Days, Alfonse Spruce, MD, 150 mg at 02/19/23 1021  Current Outpatient Medications:    albuterol (PROVENTIL) (2.5 MG/3ML) 0.083% nebulizer solution, USE 1 VIAL IN NEBULIZER EVERY 4 TO 6 HOURS AS NEEDED FOR COUGH AND/OR WHEEZE.  Generic:  Ventolin, Disp: 75 mL, Rfl: 1   albuterol (VENTOLIN HFA) 108 (90 Base) MCG/ACT inhaler, Inhale 2 puffs into the lungs every 4 (four) hours as needed for wheezing or shortness of breath., Disp: 54 g, Rfl: 3   arformoterol (BROVANA) 15 MCG/2ML NEBU, Take 2 mLs (15 mcg total) by nebulization 2 (two) times daily., Disp: 120 mL, Rfl: 11   budesonide (PULMICORT) 0.5 MG/2ML nebulizer solution, USE 1 VIAL  IN  NEBULIZER TWICE  DAILY - rinse mouth after treatment, Disp: 360 mL, Rfl: 3   carbamazepine (TEGRETOL) 200 MG tablet, Take 200-400 mg by mouth 2 (two) times daily. 200mg  tab in the morning and 400mg  at night, Disp: , Rfl:    clarithromycin (  BIAXIN) 500 MG tablet, Take 1 tablet (500 mg total) by mouth 2 (two) times daily for 10 days., Disp: 20 tablet, Rfl: 0   cromolyn (OPTICROM) 4 % ophthalmic solution, Place 2 drops into both eyes 4 (four) times daily as needed., Disp: 10 mL, Rfl: 5   cyclobenzaprine (FLEXERIL) 5 MG tablet, Take 5 mg by mouth daily., Disp: , Rfl:    diclofenac sodium (VOLTAREN) 1 % GEL, Apply 2 g topically 4 (four) times daily as needed (knee pain). , Disp: , Rfl:     doxycycline (VIBRAMYCIN) 100 MG capsule, Take 1 capsule (100 mg total) by mouth 2 (two) times daily., Disp: 10 capsule, Rfl: 0   Ensifentrine 3 MG/2.5ML SUSP, Inhale 3 mg into the lungs in the morning and at bedtime., Disp: 60 mL, Rfl: 11   EPINEPHrine (EPIPEN 2-PAK) 0.3 mg/0.3 mL IJ SOAJ injection, Inject 0.3 mg into the muscle Once PRN., Disp: 1 each, Rfl: 2   estradiol (ESTRACE) 1 MG tablet, Take 1 mg by mouth daily., Disp: , Rfl: 3   gabapentin (NEURONTIN) 600 MG tablet, Take 600-1,200 mg by mouth 2 (two) times daily. 600mg  in the morning and 1200mg  at bedtime, Disp: , Rfl:    ipratropium (ATROVENT) 0.02 % nebulizer solution, Take 2.5 mLs (0.5 mg total) by nebulization every 4 (four) hours as needed for wheezing or shortness of breath., Disp: 75 mL, Rfl: 2   levalbuterol (XOPENEX) 0.63 MG/3ML nebulizer solution, Take 3 mLs (0.63 mg total) by nebulization every 4 (four) hours as needed for wheezing or shortness of breath., Disp: 540 mL, Rfl: 1   levETIRAcetam (KEPPRA) 1000 MG tablet, Take 1,000-2,000 mg by mouth 2 (two) times daily. 1000mg  in the morning and 2000mg  at night, Disp: , Rfl:    levocetirizine (XYZAL) 5 MG tablet, Take 1 tablet (5 mg total) by mouth daily as needed for allergies (for runny nose)., Disp: 90 tablet, Rfl: 1   lisinopril (PRINIVIL,ZESTRIL) 10 MG tablet, Take 20 mg by mouth daily., Disp: , Rfl:    LORazepam (ATIVAN) 1 MG tablet, Take 1 mg by mouth 3 (three) times daily., Disp: , Rfl:    meclizine (ANTIVERT) 25 MG tablet, Take 1 tablet (25 mg total) by mouth 2 (two) times daily as needed for dizziness., Disp: 10 tablet, Rfl: 0   Melatonin 10 MG TABS, Take 10 mg by mouth daily., Disp: , Rfl:    mirtazapine (REMERON) 30 MG tablet, Take 30 mg by mouth every evening. , Disp: , Rfl:    montelukast (SINGULAIR) 10 MG tablet, TAKE 1 TABLET BY MOUTH EVERYDAY AT BEDTIME, Disp: 90 tablet, Rfl: 0   Multiple Vitamin (MULTIVITAMIN) tablet, Take 1 tablet by mouth daily. , Disp: , Rfl:     omeprazole (PRILOSEC) 40 MG capsule, Take 40 mg by mouth 2 (two) times a day., Disp: , Rfl:    ondansetron (ZOFRAN) 4 MG tablet, TAKE 1 TABLET (4 MG TOTAL) BY MOUTH 2 (TWO) TIMES DAILY AS NEEDED FOR NAUSEA., Disp: 20 tablet, Rfl: 1   oxybutynin (DITROPAN) 5 MG tablet, Take by mouth., Disp: , Rfl:    OXYGEN, Inhale 3 L into the lungs at bedtime. 4 L when walking, Disp: , Rfl:    predniSONE (DELTASONE) 10 MG tablet, Take 10 mg by mouth daily with breakfast., Disp: , Rfl:    predniSONE (STERAPRED UNI-PAK 21 TAB) 10 MG (21) TBPK tablet, Take by mouth daily. Take 6 tabs by mouth daily  for 2 days, then 5 tabs for 2  days, then 4 tabs for 2 days, then 3 tabs for 2 days, 2 tabs for 2 days, then 1 tab by mouth daily for 2 days, Disp: 42 tablet, Rfl: 0   QUEtiapine (SEROQUEL) 25 MG tablet, Take 25-50 mg by mouth at bedtime., Disp: , Rfl:    revefenacin (YUPELRI) 175 MCG/3ML nebulizer solution, Inhale 3 mLs (175 mcg total) into the lungs daily., Disp: 90 mL, Rfl: 11   roflumilast (DALIRESP) 500 MCG TABS tablet, TAKE 1 TABLET DAILY, Disp: 90 tablet, Rfl: 0   sodium chloride 1 g tablet, Take 1 tablet (1 g total) by mouth 2 (two) times daily with a meal for 5 days., Disp: 10 tablet, Rfl: 0   TESSALON PERLES 100 MG capsule, Take 1 capsule (100 mg total) by mouth 3 (three) times daily as needed for cough., Disp: 20 capsule, Rfl: 0  (Not in a hospital admission)     Exam: Current vital signs: Wt 71.6 kg   BMI 24.00 kg/m  Vital signs in last 24 hours: Temp:  [98.2 F (36.8 C)] 98.2 F (36.8 C) (09/26 1430) Pulse Rate:  [86-95] 86 (09/26 1430) Resp:  [15-22] 16 (09/26 1430) BP: (129-149)/(64-90) 129/64 (09/26 1430) SpO2:  [93 %-100 %] 99 % (09/26 1430) Weight:  [71.6 kg-75.1 kg] 71.6 kg (09/27 1100)  GENERAL: Drowsy alert in NAD HEENT: - Normocephalic and atraumatic, dry mm, no LN++, no Thyromegally LUNGS - Clear to auscultation bilaterally with no wheezes CV - S1S2 RRR, no m/r/g, equal pulses  bilaterally. ABDOMEN - Soft, nontender, nondistended with normoactive BS Ext: warm, well perfused, intact peripheral pulses, no edema  NEURO:  Mental Status: drowsy but oriented x3  Language: speech is slightly slowed.  Naming, repetition, fluency, and comprehension intact. Cranial Nerves: PERRL. EOMI, visual fields full, no facial asymmetry, facial sensation intact, hearing intact, tongue/uvula/soft palate midline, normal sternocleidomastoid and trapezius muscle strength. No evidence of tongue atrophy or fasciculations Motor: No drift bilateral upper extremities Bilateral lower extremities 3/5  Tone: is normal and bulk is normal Sensation- Intact to light touch bilaterally Coordination: FTN intact bilaterally, unable to complete in lower extremities  Gait- deferred  NIHSS 1a Level of Conscious.: 0 1b LOC Questions: 0 1c LOC Commands: 0 2 Best Gaze: 0 3 Visual: 0 4 Facial Palsy: 0 5a Motor Arm - left: 0 5b Motor Arm - Right: 0 6a Motor Leg - Left: 2 6b Motor Leg - Right: 2 7 Limb Ataxia: 0 8 Sensory: 0 9 Best Language: 0 10 Dysarthria: 0 11 Extinct. and Inatten.: 0 TOTAL: 4 due to b/l leg weakness   Labs I have reviewed labs in epic and the results pertinent to this consultation are:  CBC    Component Value Date/Time   WBC 13.0 (H) 03/07/2023 1305   RBC 3.57 (L) 03/07/2023 1305   HGB 10.9 (L) 03/07/2023 1305   HGB 14.7 09/23/2018 1021   HCT 32.2 (L) 03/07/2023 1305   HCT 44.3 09/23/2018 1021   PLT 446 (H) 03/07/2023 1305   PLT 380 09/23/2018 1021   MCV 90.2 03/07/2023 1305   MCV 93 09/23/2018 1021   MCH 30.5 03/07/2023 1305   MCHC 33.9 03/07/2023 1305   RDW 12.3 03/07/2023 1305   RDW 12.5 09/23/2018 1021   LYMPHSABS 0.8 03/07/2023 1305   LYMPHSABS 2.7 09/23/2018 1021   MONOABS 0.6 03/07/2023 1305   EOSABS 0.0 03/07/2023 1305   EOSABS 0.2 09/23/2018 1021   BASOSABS 0.0 03/07/2023 1305   BASOSABS 0.1 09/23/2018 1021  CMP     Component Value Date/Time    NA 126 (L) 03/07/2023 1305   K 4.3 03/07/2023 1305   CL 88 (L) 03/07/2023 1305   CO2 27 03/07/2023 1305   GLUCOSE 129 (H) 03/07/2023 1305   BUN 8 03/07/2023 1305   CREATININE 0.66 03/07/2023 1305   CALCIUM 8.4 (L) 03/07/2023 1305   PROT 6.4 (L) 03/07/2023 1305   ALBUMIN 3.2 (L) 03/07/2023 1305   AST 17 03/07/2023 1305   ALT 17 03/07/2023 1305   ALKPHOS 71 03/07/2023 1305   BILITOT 0.2 (L) 03/07/2023 1305   GFRNONAA >60 03/07/2023 1305   GFRAA >60 06/22/2019 0750    Lipid Panel     Component Value Date/Time   TRIG 83 06/20/2019 0910     Imaging I have reviewed the images obtained:  CT-head - Stable noncontrast head CT with no acute intracranial pathology.  MRI examination of the brain- pending   Assessment:  72 y.o. female past medical history of seizure, episodic weakness, dizziness, dysarthria, allergic rhinitis, asthma, COPD presenting with slurred speech, left drift, dizziness, at 1030. She was seen yesterday at [facility] ED yesterday for dizziness that did fully resolve prior to day.  On chart review she is followed closely with [facility] neurology team for dizziness and dysarthria that is this suspected to be a combination of orthostasis and medication effect.  Of note her Tegretol level is elevated at 19.8, TSH is 0.28, and sodium is 125.  CTA not ordered due to contrast allergy and no LVO symptoms. Plan for MRI and EEG.   Evaluated stat in the ER as a code stroke.  Impression: Encephalopathy likely due to  medication toxicity, vs seizure.   Recommendations: - MRI Brain wo.  - EEG - Telemetry monitoring - Correct metabolic derangements - Will need tegretol adjusted down if EEG is neg.  - Close follow up with outpatient neurology as well    Patient seen and examined by NP/APP with MD. MD to update note as needed.   Elmer Picker, DNP, FNP-BC [facility] Pager: [phone]  ATTENDING ATTESTATION:  H/o intermittent  weakness, self diagnosed post polio  per her Neurologist Dr. Antonietta Barcelona, she does not have polio. Likely encephalopathy and not stroke. Will r/o seizure with EEG. If MRI neg, close outpt f/u with her neurologist.   Addendum: MRI is negative for acute stroke shows white matter lesions.  EEG pending.  Dr. Viviann Spare evaluated pt independently, reviewed imaging, chart, labs. Discussed and formulated plan with the Resident/APP. Changes were made to the note where appropriate. Please see APP/resident note above for details.     MDM: High. Pertinent labs, imaging results reviewed by me and considered in my decision making. Independently reviewed imaging. Medical records reviewed. Discussed the patient with another medical provider/personnel. Obtained history from someone other than the patient-EMS   Anne Woo,MD

## 2023-03-08 NOTE — ED Notes (Signed)
Patient transported to MRI 

## 2023-03-08 NOTE — ED Triage Notes (Signed)
Pt BIB [facility] from home. Pt's friend reports LKW at 1030 this morning. Per friend pt began slurring her speech, leaning to her left side and having left sided arm weakness. A new normal for the past 3 weeks for pt has been bilateral lower leg weakness and assistance when ambulating. Per EMS pt could not lift her left arm until hospital arrival. EMS also reports a 3 week history of weakness and hyponatremia. Pt had a MRI done on 9/14 for "spot on brain". Pt was also seen 9/26 at [facility] for dizziness.   22 RFA  EMS VS BP 100/50 P 82 O2 97% CBG 124

## 2023-03-08 NOTE — Telephone Encounter (Signed)
I sent in Biaxin BID for 7 days instead.

## 2023-03-08 NOTE — Procedures (Signed)
Patient Name: Daysi Boggan  MRN: [id number]  Epilepsy Attending: Charlsie Quest  Referring Physician/Provider: Elmer Picker, NP  Date: 03/08/2023 Duration: 22.19 mins  Patient history: 72 yo F with slurred speech, left drift, dizziness. EEG to evaluate for seizure  Level of alertness: Awake  AEDs during EEG study: Ativan  Technical aspects: This EEG study was done with scalp electrodes positioned according to the 10-20 International system of electrode placement. Electrical activity was reviewed with band pass filter of 1-70Hz , sensitivity of 7 uV/mm, display speed of 16mm/sec with a 60Hz  notched filter applied as appropriate. EEG data were recorded continuously and digitally stored.  Video monitoring was available and reviewed as appropriate.  Description: The posterior dominant rhythm consists of 7 Hz activity of moderate voltage (25-35 uV) seen predominantly in posterior head regions, symmetric and reactive to eye opening and eye closing. EEG showed continuous generalized 5 to 7 Hz theta slowing. Hyperventilation and photic stimulation were not performed.     ABNORMALITY - Continuous slow, generalized  IMPRESSION: This study is suggestive of moderate diffuse encephalopathy. No seizures or epileptiform discharges were seen throughout the recording.  Jull Harral Annabelle Harman

## 2023-03-08 NOTE — Code Documentation (Signed)
Stroke Response Nurse Documentation Code Documentation  Anne Rich is a 72 y.o. female arriving to [facility]  via [facility] on 9/27 with past medical hx of CVA, htn, smoking. On No antithrombotic. Code stroke was activated by EMS.   Patient from home where she was LKW at 1030 and now complaining of L sided weakness, slurred speech, and confusion. Patient seen yesterday for dizziness and intermittent weakness and discharged. Those symptoms resolved and at 1030 today developed L sided weakness, facial droop, and slurred speech. Symptoms improving in route and none present on arrival.    Stroke team at the bedside on patient arrival. Labs drawn and patient cleared for CT by Dr. Doran Durand. Patient to CT with team. NIHSS 4, see documentation for details and code stroke times. Patient with bilateral leg weakness on exam. The following imaging was completed:  CT Head. Patient is not a candidate for IV Thrombolytic due to no stroke suspected per MD. Patient is not not a candidate for IR due to no LVO suspected per MD.   Care Plan: MRI, EEG, metabolic work up.   Bedside handoff with ED RN Corrie Dandy and Porfirio Mylar.    Anne Rich  Stroke Response RN

## 2023-03-08 NOTE — ED Provider Notes (Signed)
[place] EMERGENCY DEPARTMENT AT [facility] Provider Note   CSN: [id number] Arrival date & time: 03/08/23  1128  An emergency department physician performed an initial assessment on this suspected stroke patient at 1130.  History Chief Complaint  Patient presents with   Code Stroke    HPI Anne Rich is a 72 y.o. female presenting for chief complaint of confusion/altered mental status called in as an LVO code stroke per EMS.  Brought in from home where her roommate/family member noticed that she was more confused and having difficulty with speech than prior.  Allegedly started at 10 AM today.  Notably patient was seen in outside emergency department yesterday for very similar symptoms. Found to be hyponatremic with an elevated Tegretol level consulted with neurology deemed to be stable for outpatient care and management.   Patient's recorded medical, surgical, social, medication list and allergies were reviewed in the Snapshot window as part of the initial history.   Review of Systems   Review of Systems  Constitutional:  Negative for chills and fever.  HENT:  Negative for ear pain and sore throat.   Eyes:  Negative for pain and visual disturbance.  Respiratory:  Negative for cough and shortness of breath.   Cardiovascular:  Negative for chest pain and palpitations.  Gastrointestinal:  Negative for abdominal pain and vomiting.  Genitourinary:  Negative for dysuria and hematuria.  Musculoskeletal:  Negative for arthralgias and back pain.  Skin:  Negative for color change and rash.  Neurological:  Positive for speech difficulty and weakness. Negative for seizures and syncope.  Psychiatric/Behavioral:  Positive for confusion.   All other systems reviewed and are negative.   Physical Exam Updated Vital Signs BP 139/67   Pulse 74   Temp 97.6 F (36.4 C) (Oral)   Resp 13   Ht 5\' 8"  (1.727 m)   Wt 66.3 kg   SpO2 95%   BMI 22.23 kg/m  Physical Exam Vitals and  nursing note reviewed.  Constitutional:      General: She is not in acute distress.    Appearance: She is well-developed.  HENT:     Head: Normocephalic and atraumatic.  Eyes:     Conjunctiva/sclera: Conjunctivae normal.  Cardiovascular:     Rate and Rhythm: Normal rate and regular rhythm.     Heart sounds: No murmur heard. Pulmonary:     Effort: Pulmonary effort is normal. No respiratory distress.     Breath sounds: Normal breath sounds.  Abdominal:     General: There is no distension.     Palpations: Abdomen is soft.     Tenderness: There is no abdominal tenderness. There is no right CVA tenderness or left CVA tenderness.  Musculoskeletal:        General: No swelling or tenderness. Normal range of motion.     Cervical back: Neck supple.  Skin:    General: Skin is warm and dry.  Neurological:     Mental Status: She is alert.     Comments: Agree with exam per neurology.  Performed jointly      ED Course/ Medical Decision Making/ A&P Clinical Course as of 03/08/23 1546  Fri Mar 08, 2023  1256 I was called out as patient was threatening to sign out AMA if I will not give her narcotic pain medication.  I informed her that she is here with encephalopathy and we are trying to evaluate for the etiology of this confusion and her medications are thought to be  a primary culprit. She has refused to get an MRI without benzodiazepines.  I do not believe it is safe to continue administering sedative substances to a patient here with a chief complaint of AMS.  I informed her of risk of missed disease, progressive illness, death if she decides to sign out AMA.  She has expressed understanding of these risk and is currently electing to stay for further care.  Ultimately while her chief complaint is altered mental status, the altered mental status is more so musculoskeletal and aphasia and does not appear to represent any cognitive or legal capacity impairment. [CC]    Clinical Course User  Index [CC] Glyn Ade, MD    Procedures .Critical Care  Performed by: Glyn Ade, MD Authorized by: Glyn Ade, MD   Critical care provider statement:    Critical care time (minutes):  30   Critical care was necessary to treat or prevent imminent or life-threatening deterioration of the following conditions:  CNS failure or compromise and toxidrome   Critical care was time spent personally by me on the following activities:  Development of treatment plan with patient or surrogate, discussions with consultants, evaluation of patient's response to treatment, examination of patient, ordering and review of laboratory studies, ordering and review of radiographic studies, ordering and performing treatments and interventions, pulse oximetry, re-evaluation of patient's condition and review of old charts    Medications Ordered in ED Medications  acetaminophen (TYLENOL) tablet 1,000 mg (1,000 mg Oral Patient Refused/Not Given 03/08/23 1301)  LORazepam (ATIVAN) injection 0.5 mg (0.5 mg Intravenous Given 03/08/23 1259)  Medical Decision Making:    Anne Rich is a 72 y.o. female  who presented to the ED today with multiple symptoms.  Due to these symptoms, nursing activated a CODE STROKE per hospital protocol.   On my initial exam, the pt was in no acute distress, glucose was within normal limits and deficits include bilateral lower extremity weakness.  Deficits are  persistent.   Reviewed and confirmed nursing documentation for past medical history, family history, social history.   Initial Assessment and Plan:   Patient immediately evaluated jointly by neurology and emergency department providers.   This is most consistent with an acute life/limb threatening illness complicated by underlying chronic conditions.  Patient evaluated per code stroke protocol with immediate cross-sectional imaging of the head via CT head to evaluate for intracranial hemorrhage.  This was augmented  with CT angiography and perfusional imaging of the brain for further evaluation of large vessel occlusion.   Per neurology, these rapid studies revealed no acute pathology. Neurology feels that patient's presentation is more consistent with alternative pathology given these findings.  Differential includes metabolic encephalopathy, medication encephalopathy, infectious encephalopathy. Neurology has recommended an MRI for further differentiation of patient's syndrome and evaluation for any ischemic disease while completing a metabolic/infectious workup with lab work.  Initial Study Results: Labs Labs reviewed without evidence of clinically relevant abnormality. Exceptions include: Elevated Tegretol level is persistent but downtrending from yesterday.  Hyponatremia same as yesterday.  EKG EKG was reviewed independently. Rate, rhythm, axis, intervals all examined and without medically relevant abnormality. ST segments without concerns for elevations.    Radiology  Images reviewed independently, agree with radiology report at this time.   CT HEAD CODE STROKE WO CONTRAST  Final Result    MR BRAIN WO CONTRAST    (Results Pending)     Final Assessment and Plan:   See ED course for overall reassessment.  Mental status is  improving while in the emergency room.  MRI is pending result. Neurology is following patient.  They will reassess after MRI and if patient continues to improve she will be stable for discharge pending this reassessment. Care of patient handed off to next provider pending reassessment.  Emergency Department Medication Summary:   Medications  acetaminophen (TYLENOL) tablet 1,000 mg (1,000 mg Oral Patient Refused/Not Given 03/08/23 1301)  LORazepam (ATIVAN) injection 0.5 mg (0.5 mg Intravenous Given 03/08/23 1259)        Clinical Impression:  1. Altered mental status, unspecified altered mental status type      Data Unavailable   Final Clinical Impression(s) / ED  Diagnoses Final diagnoses:  Altered mental status, unspecified altered mental status type    Rx / DC Orders ED Discharge Orders     None         Glyn Ade, MD 03/08/23 1547

## 2023-03-08 NOTE — Progress Notes (Signed)
EEG complete - results pending 

## 2023-03-08 NOTE — Progress Notes (Signed)
No need to LTM as per Melynda Ripple.

## 2023-03-08 NOTE — ED Notes (Signed)
Attempted to call EEG to check on pt's status x2 with no answer.

## 2023-03-08 NOTE — Telephone Encounter (Signed)
Kenidy called back again this morning and states she is still dizzy, her salt is low, and she is not walking.  Mersadies states the antibiotic Dr. Dellis Anes gave her is "no count" and she is still coughing.  Saleen states she told the doctor at the ED to call Dr. Dellis Anes but they didn't.  Caramia wants to know if something else can be called in.  Marialy also states she cannot come into the office to fill out paper work so she will stop by next week.

## 2023-03-08 NOTE — ED Provider Notes (Signed)
72 year old female presents the emergency department today with confusion.  She was initially code stroke and was evaluated by neurology.  The plan is for EEG and reevaluation after for ultimate disposition.  Her mental status has improved while she has been here in the emergency department.  Physical Exam  BP 107/84   Pulse 83   Temp 97.6 F (36.4 C) (Oral)   Resp 18   Ht 5\' 8"  (1.727 m)   Wt 66.3 kg   SpO2 91%   BMI 22.23 kg/m   Physical Exam Vitals and nursing note reviewed.     Procedures  Procedures  ED Course / MDM   Clinical Course as of 03/08/23 1618  Fri Mar 08, 2023  1256 I was called out as patient was threatening to sign out AMA if I will not give her narcotic pain medication.  I informed her that she is here with encephalopathy and we are trying to evaluate for the etiology of this confusion and her medications are thought to be a primary culprit. She has refused to get an MRI without benzodiazepines.  I do not believe it is safe to continue administering sedative substances to a patient here with a chief complaint of AMS.  I informed her of risk of missed disease, progressive illness, death if she decides to sign out AMA.  She has expressed understanding of these risk and is currently electing to stay for further care.  Ultimately while her chief complaint is altered mental status, the altered mental status is more so musculoskeletal and aphasia and does not appear to represent any cognitive or legal capacity impairment. [CC]    Clinical Course User Index [CC] Glyn Ade, MD   Medical Decision Making Amount and/or Complexity of Data Reviewed Labs: ordered. Radiology: ordered.  Risk OTC drugs. Prescription drug management.   The patient was stable here in the emergency department.  Vitals are stable.  Her EEG did not show any seizures and neurology recommended discharge with outpatient follow-up.  She is discharged with return precautions.        Durwin Glaze, MD 03/09/23 [phone]

## 2023-03-08 NOTE — Telephone Encounter (Signed)
I called patient and informed, patient verbalized understanding and stated that she is back in the hospital.

## 2023-03-08 NOTE — ED Notes (Signed)
Attempted to call EEG to check on pt's status with no answer.

## 2023-03-08 NOTE — ED Notes (Signed)
Patient placed back onto 12-lead. She is comfortable at this time.

## 2023-03-09 NOTE — Discharge Instructions (Signed)
Your workup today was reassuring.  Your EEG did not show any seizure activity.  Please call your neurologist and schedule a follow-up appointment and to see if they would like to make any changes in your medications.  Return to the ER for worsening symptoms.

## 2023-03-11 NOTE — Telephone Encounter (Signed)
Can we call and check on her sometime?   Malachi Bonds, MD [facility]

## 2023-03-11 NOTE — Telephone Encounter (Addendum)
Called patient - DOB/NEED updated DPR - LMOVM to contact office regarding provider notation below.

## 2023-03-12 NOTE — Telephone Encounter (Signed)
Called and left a voicemail asking for patient to return call to discuss.  °

## 2023-03-13 NOTE — Telephone Encounter (Signed)
Forwarding updated message to provider.

## 2023-03-13 NOTE — Telephone Encounter (Signed)
I called the patient to follow up on her symptoms. She has not picked up the Biaxin yet so no change in her symptoms. She also had questions about the Tezspire injection. I did inform her that Tammy will contact her about that transition. She just brought the consent form or paperwork back yesterday to the [place] office.

## 2023-03-14 NOTE — Telephone Encounter (Signed)
Thanks, much!   Malachi Bonds, MD [facility]

## 2023-03-14 NOTE — Progress Notes (Signed)
Patient came in and signed form. Form has been faxed to [phone].

## 2023-03-18 ENCOUNTER — Telehealth: Payer: Self-pay | Admitting: Allergy & Immunology

## 2023-03-18 NOTE — Telephone Encounter (Signed)
Patient asking for a call from the nurse stating she is having shortness of breath at times Dr. Dellis Anes has prescribed prednisone but this has only helped short term please advise

## 2023-03-19 NOTE — Telephone Encounter (Signed)
Pt stated her oxygen dropped to 64% and she was not on her oxygen at that time. She is on her oxygen since and she is in 90s. She has not had xolair and waiting to hear about other biologic. She is just worried about all this being she has not had her injection in awhile

## 2023-03-19 NOTE — Telephone Encounter (Signed)
Routing to Tammy to see where we are on the Tezspire approval.   Malachi Bonds, MD [facility]

## 2023-03-20 NOTE — Telephone Encounter (Signed)
Front desk - can you call and schedule for a Tezspire injection?

## 2023-03-20 NOTE — Telephone Encounter (Signed)
Patient returned call and advised will do buy & bill tezspire for her due to Ins and patient to come in tomorrow to start therapy in [place]

## 2023-03-20 NOTE — Telephone Encounter (Signed)
Great - thank you for your persistence!

## 2023-03-20 NOTE — Telephone Encounter (Signed)
L/m again for patient to reach out to me

## 2023-03-20 NOTE — Telephone Encounter (Signed)
I have reached out to patient but she has not returned my calls. She is buy and bill and we can get her started asap

## 2023-03-21 ENCOUNTER — Ambulatory Visit (INDEPENDENT_AMBULATORY_CARE_PROVIDER_SITE_OTHER): Payer: Medicare Other | Admitting: Pulmonary Disease

## 2023-03-21 ENCOUNTER — Encounter: Payer: Self-pay | Admitting: Pulmonary Disease

## 2023-03-21 ENCOUNTER — Ambulatory Visit: Payer: Medicare Other

## 2023-03-21 VITALS — BP 136/84 | HR 98 | Ht 68.0 in | Wt 155.8 lb

## 2023-03-21 DIAGNOSIS — J4489 Other specified chronic obstructive pulmonary disease: Secondary | ICD-10-CM
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
## 2023-03-21 NOTE — Patient Instructions (Signed)
VISIT SUMMARY:  During your visit, we discussed your severe asthma and the dizziness you've been experiencing. We noted that your oxygen levels drop significantly during physical activity, which has led to limitations in your physical therapy exercises. We also discussed your concerns about weight gain, which you believe is due to your current asthma medication, Prednisone. Additionally, we talked about your high Tegretol levels, which need to be managed carefully due to your risk of seizures.  YOUR PLAN:  -SEVERE ASTHMA: Your asthma is causing difficulty in breathing and a drop in oxygen levels during physical activity. We're transitioning you to a new medication called Tyspire, and you'll continue taking Prednisone. We'll also review your inhaler and nebulizer regimen at your next visit.  -DIZZINESS AND FALLS: Your dizziness is likely due to high Tegretol levels. We'll continue to carefully reduce your Tegretol dosage while closely monitoring you. You'll also continue with physical therapy to improve your strength and balance.  INSTRUCTIONS:  You will receive the influenza vaccine today. Your first Tyspire treatment is scheduled for next Monday. Please continue taking Prednisone 10mg  daily and follow the plan for reducing your Tegretol dosage. Continue with your physical therapy exercises for strength and balance. We will follow up in 6 months.

## 2023-03-25 ENCOUNTER — Ambulatory Visit: Payer: Medicare Other

## 2023-03-25 ENCOUNTER — Telehealth: Payer: Self-pay | Admitting: Family Medicine

## 2023-03-25 DIAGNOSIS — J4550 Severe persistent asthma, uncomplicated: Secondary | ICD-10-CM

## 2023-03-25 MED ORDER — TEZEPELUMAB-EKKO 210 MG/1.91ML ~~LOC~~ SOSY
210.0000 mg | PREFILLED_SYRINGE | SUBCUTANEOUS | Status: AC
  Administered 2023-03-25 – 2024-06-17 (×17): 210 mg via SUBCUTANEOUS

## 2023-03-25 NOTE — Telephone Encounter (Signed)
Pt had first teszpire today

## 2023-03-25 NOTE — Progress Notes (Signed)
Immunotherapy   Patient Details  Name: Anne Rich MRN: [id number] Date of Birth: 02-02-1951  03/25/2023  [name] started Tezspire 210 mg/1.91 mL   Frequency: every 28 day Consent signed and patient instructions given. No problems after 30 minute wait.   [name] J Lebaron Bautch 03/25/2023, 2:23 PM

## 2023-03-25 NOTE — Telephone Encounter (Signed)
Patient is calling stating after taking the tezspire shot she is sick on her stomach asking for a nurse to call her in reference to this issue

## 2023-03-26 NOTE — Telephone Encounter (Signed)
I hope it helps!

## 2023-03-26 NOTE — Telephone Encounter (Signed)
Patient is calling stating after taking the tezspire shot she is sick on her stomach asking for a nurse to call her in reference to this issue

## 2023-03-28 ENCOUNTER — Telehealth: Payer: Self-pay | Admitting: Allergy & Immunology

## 2023-03-28 NOTE — Telephone Encounter (Signed)
Chauntelle called in and states Dr. Dellis Anes put her on a new mediation called OHTUVAYRE.  She has questions and concerns about this medication and would like a call back from a nurse.

## 2023-03-29 NOTE — Telephone Encounter (Signed)
Sending to Dr. Allena Katz because this is the on call doctor today and primary doctor is out of office. Dr. Dellis Anes is attached to this note so that he is informed. Spoke to patient on the phone and about the medication she was referring to. Name on the package that she got from [employer] is "ohtuvayre". Patient stated that Dr. Dellis Anes suggested this medication. However, patient states that she was advised that this medication is $1000 a month and wants to know if this medication can be approved through her insurance because she can not afford it on her own.   Addressed the oxygen question as follows: Inquired if the patient has spoken with her PCP and her pulmonologist about this issue (O2 dropping drastically with exertion). Patient stated that the providers that she sees does not address these issues. Advised the patient that oxygen is required for the muscles to operate so if she is doing activity it is natural to be using more oxygen while in motion than when he is unmoving. Patient stated that she knows this, but this is a new occurrence and it has not happened before and would like to know the cause of this. Advised patient that provider would be made aware of this and that someone would call her back with further information.

## 2023-03-29 NOTE — Telephone Encounter (Signed)
Patient called back in regards to her medication. Patient also states at her physical therapy appointment her oxygen levels drop and then come back up to normal levels patient wants to know why that happens.

## 2023-04-01 NOTE — Telephone Encounter (Signed)
Routing to [employer] since she helped me fill out of the forms for this.   Morrie Sheldon - what is the reps name? I can try to call them.   In the interim, please tell the patient to NOT pay for this medication. It is a new medication and clearly not easy to prescribe.   Malachi Bonds, MD [facility]

## 2023-04-01 NOTE — Telephone Encounter (Signed)
How is she feeling now? Maybe this was not even related.  Anne Bonds, MD [facility]

## 2023-04-01 NOTE — Telephone Encounter (Signed)
The rep is Oletha Cruel [phone]. Forms were filled out and faxed over, on the form if a PA was needed it would automatically enroll the in the [facility] during coverage delay where they would get a 30 day supply and up to 1 refill.

## 2023-04-02 NOTE — Telephone Encounter (Signed)
Spoke with patient, she stated that it lasted for about 45 minutes and she isn't sure if it was the [place] or the food she had right after. I informed her that we would want her to get her next Tezspire injections and let us know if this happens again after receiving it. I informed patient I would send Dr. Dellis Anes a message and gets his recommendation. Patient verbalized understanding. Dr. Dellis Anes please advise.

## 2023-04-02 NOTE — Telephone Encounter (Signed)
Spoke with patient, she stated that she spoke with pharmacy and they informed her that it looks like insurance is going to cover it however they should know in the next few days. Patient will call with an update when she hears if it is covered or not.

## 2023-04-03 NOTE — Telephone Encounter (Signed)
Thank you, Layal Javid

## 2023-04-03 NOTE — Telephone Encounter (Signed)
Patient called stating insurance stated they would cover up to 1060.00 of the medication the insurance also advised patient a sample could be given asking for a call back from the nurse

## 2023-04-03 NOTE — Telephone Encounter (Signed)
I would prefer for her to keep receiving the Tezspire. Thanks for reaching out to her, Morrie Sheldon!

## 2023-04-04 ENCOUNTER — Telehealth: Payer: Self-pay | Admitting: Allergy & Immunology

## 2023-04-04 NOTE — Telephone Encounter (Signed)
Patient called stating she has questions about 4 medications Albuterol, Arformotol,Yuperli,Budesonide. Patient was unspecific about the questions she is having.

## 2023-04-04 NOTE — Telephone Encounter (Addendum)
Received Notice of Dismissal of Coverage Request fax from [employer] on Ohtuvayre Suspension:  The coverage request filed on 04/03/23 has been dismissed.  The paperwork explaining the above and additional information request form has been placed in provider's in basket for next step.  Forwarding updated message to provider and site Production designer, theatre/television/film.

## 2023-04-04 NOTE — Telephone Encounter (Signed)
Spoke with pt she is referring to [facility]  do you want [place] to do a pa?

## 2023-04-08 NOTE — Telephone Encounter (Signed)
Pt has an appt tomorrow and will ask dr gallagher about when to take what meds

## 2023-04-09 ENCOUNTER — Other Ambulatory Visit: Payer: Self-pay

## 2023-04-09 ENCOUNTER — Telehealth: Payer: Self-pay | Admitting: Allergy & Immunology

## 2023-04-09 ENCOUNTER — Ambulatory Visit (INDEPENDENT_AMBULATORY_CARE_PROVIDER_SITE_OTHER): Payer: Medicare Other | Admitting: Allergy & Immunology

## 2023-04-09 ENCOUNTER — Encounter: Payer: Self-pay | Admitting: Allergy & Immunology

## 2023-04-09 VITALS — BP 112/78 | HR 85 | Temp 98.2°F | Resp 16

## 2023-04-09 DIAGNOSIS — J4489 Other specified chronic obstructive pulmonary disease: Secondary | ICD-10-CM | POA: Diagnosis not present

## 2023-04-09 DIAGNOSIS — Z7952 Long term (current) use of systemic steroids: Secondary | ICD-10-CM

## 2023-04-09 DIAGNOSIS — K219 Gastro-esophageal reflux disease without esophagitis: Secondary | ICD-10-CM | POA: Diagnosis not present

## 2023-04-09 DIAGNOSIS — J3089 Other allergic rhinitis: Secondary | ICD-10-CM | POA: Diagnosis not present

## 2023-04-09 NOTE — Patient Instructions (Addendum)
Moderate persistent asthma with COPD overlap:  - Lung function looked a little bit better.  - Continue with the Tezspire every month.  - STOP the Dalisrep since this is in the same medication class as the new nebulizer medicine.  - I know this is a lot of nebulized medications, but let's continue for now.  - We may consider lowering the prednisone dose at the next visit.  - I think you can mix Pulmicort and Brovana together in the nebulizer.  - Daily controller medication(s):       AM: Pulmicort (budesonide 0.5mg ) + Brovana (aformoterol) + Ohtuvayre (ensifentrine) + Yupelri (revefenacin) + prednisone 10mg       PM: Pulmicort (budesonide 0.5mg ) + Brovana (aformoterol) + Ohtuvayre (ensifentrine)       EVERY FOUR WEEKS: Tezspire  - Rescue medications: Xopenex (levalbuterol) + Atrovent (ipratropium) mixed together every 4-6 hours as needed via nebulizer - Asthma control goals:  * Full participation in all desired activities (may need albuterol before activity) * Albuterol use two time or less a week on average (not counting use with activity) * Cough interfering with sleep two time or less a month * Oral steroids no more than once a year * No hospitalizations * Minimize medical procedures   2. Allergic rhinitis - Continue with montelukast 10mg  daily.  - Continue with levocetirizine 5mg  daily   3. Return in about 2 months (around 06/09/2023).    Please inform us of any Emergency Department visits, hospitalizations, or changes in symptoms. Call us before going to the ED for breathing or allergy symptoms since we might be able to fit you in for a sick visit. Feel free to contact us anytime with any questions, problems, or concerns.  It was a pleasure to see you again today!  Websites that have reliable patient information: 1. American Academy of Asthma, Allergy, and Immunology: www.aaaai.org 2. Food Allergy Research and Education (FARE): foodallergy.org 3. Mothers of Asthmatics:  http://www.asthmacommunitynetwork.org 4. American College of Allergy, Asthma, and Immunology: www.acaai.org   COVID-19 Vaccine Information can be found at: [email or web address] For questions related to vaccine distribution or appointments, please email [email or web address] or call [phone].   We realize that you might be concerned about having an allergic reaction to the COVID19 vaccines. To help with that concern, WE ARE OFFERING THE COVID19 VACCINES IN OUR OFFICE! Ask the front desk for dates!     "Like" [place] on Facebook and Instagram for our latest updates!      A healthy democracy works best when [employer] participate! Make sure you are registered to vote! If you have moved or changed any of your contact information, you will need to get this updated before voting!  In some cases, you MAY be able to register to vote online: [email or web address]

## 2023-04-09 NOTE — Telephone Encounter (Signed)
Patient called asking if she can take both Ohtuvayre and her rescue inhaler at the same time.
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
## 2023-04-09 NOTE — Telephone Encounter (Signed)
Is this ok to do? I'm not familiar since this is a new medication.

## 2023-04-10 NOTE — Telephone Encounter (Signed)
That is completely fine. They work differently.   Anne Bonds, MD [facility]

## 2023-04-10 NOTE — Telephone Encounter (Signed)
Thanks. I saw her yesterday and she has the medication.   Malachi Bonds, MD [facility]

## 2023-04-11 ENCOUNTER — Encounter: Payer: Self-pay | Admitting: Allergy & Immunology

## 2023-04-16 NOTE — Telephone Encounter (Signed)
PA Denied  - paperwork has been placed in provider's in basket for review.  Forwarding message to provider as update.

## 2023-04-19 NOTE — Telephone Encounter (Signed)
I think this is approved through Medicare part B.

## 2023-04-22 ENCOUNTER — Ambulatory Visit: Payer: Medicare Other

## 2023-04-22 ENCOUNTER — Other Ambulatory Visit: Payer: Self-pay

## 2023-04-22 DIAGNOSIS — J4550 Severe persistent asthma, uncomplicated: Secondary | ICD-10-CM

## 2023-04-22 MED ORDER — ENSIFENTRINE 3 MG/2.5ML IN SUSP: 3.0000 mg | Freq: Two times a day (BID) | RESPIRATORY_TRACT | 11 refills | Status: AC

## 2023-04-22 NOTE — Telephone Encounter (Signed)
Patient came in for Tezspire injection today and wanted a refill for Ohtuvayre 3 mg/2.5 mL. Sent refill to [facility]

## 2023-04-23 DIAGNOSIS — J4550 Severe persistent asthma, uncomplicated: Secondary | ICD-10-CM | POA: Diagnosis not present

## 2023-04-24 NOTE — Telephone Encounter (Signed)
Patient called and stated that she had a refill called in from [facility] and that the pharmacy called her and said that they is a problem with the prescription. Patient stated that pharmacy wants the provider to call them back because the prescription was written incorrectly. Patient would like a call back before this is done so she can explain the issue. Patients call back number is [phone]

## 2023-04-24 NOTE — Telephone Encounter (Signed)
Forwarding updated message to provider.

## 2023-04-25 NOTE — Telephone Encounter (Signed)
Can someone call her back to find out what the problem is with the prescription? Or call the pharmacy directly?   Malachi Bonds, MD [facility]

## 2023-04-25 NOTE — Telephone Encounter (Addendum)
Called patient - DOB verified - stated she received letter from [facility] regarding Ohtuvayre (Ensifentrine) .  Patient advised I would contact [phone] - spoke to [employer]. - DOB verified - stated patient needs to contact insurance to confirm no updates.  Called patient back - Advised of above notation - gave [facility] contact information.  Patient verbalized understanding to all, will go back once she has spoken to insurance.

## 2023-05-07 NOTE — Addendum Note (Signed)
Addended by: Devoria Glassing on: 05/07/2023 11:07 AM   Modules accepted: Orders

## 2023-05-15 NOTE — Telephone Encounter (Signed)
Letter received via fax from [facility] for provider signature so medicare part b can be billed. Provider has signed and form has been faxed to [phone]. Forms have been placed to bulk scanning.

## 2023-05-16 ENCOUNTER — Other Ambulatory Visit: Payer: Self-pay | Admitting: Pulmonary Disease

## 2023-05-20 ENCOUNTER — Ambulatory Visit: Payer: Medicare Other

## 2023-05-20 DIAGNOSIS — J4550 Severe persistent asthma, uncomplicated: Secondary | ICD-10-CM | POA: Diagnosis not present

## 2023-05-21 ENCOUNTER — Other Ambulatory Visit: Payer: Self-pay

## 2023-05-21 ENCOUNTER — Encounter: Payer: Self-pay | Admitting: Allergy & Immunology

## 2023-05-21 ENCOUNTER — Other Ambulatory Visit: Payer: Self-pay | Admitting: Allergy & Immunology

## 2023-05-21 ENCOUNTER — Ambulatory Visit (INDEPENDENT_AMBULATORY_CARE_PROVIDER_SITE_OTHER): Payer: Medicare Other | Admitting: Allergy & Immunology

## 2023-05-21 VITALS — BP 80/60 | HR 74 | Temp 97.7°F | Resp 12

## 2023-05-21 DIAGNOSIS — Z7952 Long term (current) use of systemic steroids: Secondary | ICD-10-CM | POA: Diagnosis not present

## 2023-05-21 DIAGNOSIS — I959 Hypotension, unspecified: Secondary | ICD-10-CM

## 2023-05-21 DIAGNOSIS — J4489 Other specified chronic obstructive pulmonary disease: Secondary | ICD-10-CM | POA: Diagnosis not present

## 2023-05-21 DIAGNOSIS — K219 Gastro-esophageal reflux disease without esophagitis: Secondary | ICD-10-CM

## 2023-05-21 DIAGNOSIS — J3089 Other allergic rhinitis: Secondary | ICD-10-CM | POA: Diagnosis not present

## 2023-05-21 MED ORDER — YUPELRI 175 MCG/3ML IN SOLN: 175.0000 ug | Freq: Every day | RESPIRATORY_TRACT | 5 refills | Status: DC

## 2023-05-21 MED ORDER — ARFORMOTEROL TARTRATE 15 MCG/2ML IN NEBU: 15.0000 ug | INHALATION_SOLUTION | Freq: Two times a day (BID) | RESPIRATORY_TRACT | 5 refills | Status: DC

## 2023-05-21 MED ORDER — IPRATROPIUM BROMIDE 0.02 % IN SOLN: 0.5000 mg | RESPIRATORY_TRACT | 2 refills | Status: AC | PRN

## 2023-05-21 MED ORDER — LEVOCETIRIZINE DIHYDROCHLORIDE 5 MG PO TABS: 5.0000 mg | ORAL_TABLET | Freq: Every day | ORAL | 1 refills | Status: DC | PRN

## 2023-05-21 MED ORDER — MONTELUKAST SODIUM 10 MG PO TABS: ORAL_TABLET | ORAL | 1 refills | Status: DC

## 2023-05-21 MED ORDER — TESSALON PERLES 100 MG PO CAPS: 100.0000 mg | ORAL_CAPSULE | Freq: Three times a day (TID) | ORAL | 0 refills | Status: AC | PRN

## 2023-05-21 MED ORDER — BUDESONIDE 0.5 MG/2ML IN SUSP: RESPIRATORY_TRACT | 5 refills | Status: DC

## 2023-05-21 MED ORDER — LEVALBUTEROL HCL 0.63 MG/3ML IN NEBU: 0.6300 mg | INHALATION_SOLUTION | RESPIRATORY_TRACT | 1 refills | Status: DC | PRN

## 2023-05-21 MED ORDER — PREDNISONE 1 MG PO TABS
10.0000 mg | ORAL_TABLET | Freq: Every day | ORAL | Status: DC
Start: 2023-05-22 — End: 2023-07-10
  Administered 2023-05-21: 10 mg via ORAL

## 2023-05-21 NOTE — Patient Instructions (Addendum)
Moderate persistent asthma with COPD overlap:  - Lung function looked stable.  - You need to start prednisone 5 mg EVERY DAY!   - Continue with the Tezspire every month.  - I know this is a lot of nebulized medications, but let's continue for now.  - We may consider lowering the prednisone dose at the next visit.  - I think you can mix Pulmicort and Brovana together in the nebulizer.  - Daily controller medication(s):       AM: Pulmicort (budesonide 0.5mg ) + Brovana (aformoterol) + Ohtuvayre (ensifentrine) + Yupelri (revefenacin) + prednisone 5mg       PM: Pulmicort (budesonide 0.5mg ) + Brovana (aformoterol) + Ohtuvayre (ensifentrine)       EVERY FOUR WEEKS: Tezspire  - Rescue medications: Xopenex (levalbuterol) + Atrovent (ipratropium) mixed together every 4-6 hours as needed via nebulizer - Asthma control goals:  * Full participation in all desired activities (may need albuterol before activity) * Albuterol use two time or less a week on average (not counting use with activity) * Cough interfering with sleep two time or less a month * Oral steroids no more than once a year * No hospitalizations * Minimize medical procedures   2. Allergic rhinitis - Continue with montelukast 10mg  daily.  - Continue with levocetirizine 5mg  daily   3. Return in about 2 months (around 07/22/2023). You can have the follow up appointment with Dr. Dellis Anes or a Nurse Practicioner (our Nurse Practitioners are excellent and always have Physician oversight!).    Please inform us of any Emergency Department visits, hospitalizations, or changes in symptoms. Call us before going to the ED for breathing or allergy symptoms since we might be able to fit you in for a sick visit. Feel free to contact us anytime with any questions, problems, or concerns.  It was a pleasure to see you again today!  Websites that have reliable patient information: 1. American Academy of Asthma, Allergy, and Immunology: www.aaaai.org 2.  Food Allergy Research and Education (FARE): foodallergy.org 3. Mothers of Asthmatics: http://www.asthmacommunitynetwork.org 4. American College of Allergy, Asthma, and Immunology: www.acaai.org      "Like" [place] on Facebook and Instagram for our latest updates!      A healthy democracy works best when [employer] participate! Make sure you are registered to vote! If you have moved or changed any of your contact information, you will need to get this updated before voting! Scan the QR codes below to learn more!

## 2023-05-21 NOTE — Progress Notes (Unsigned)
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  - You need to start prednisone 5 mg EVERY DAY!   - Continue with the Tezspire every month.  - I know this is a lot of nebulized medications, but let's continue for now.  - I think you can mix Pulmicort and Brovana together in the nebulizer.  - Daily controller medication(s):       AM: Pulmicort (budesonide 0.5mg ) + Brovana (aformoterol) + Ohtuvayre (ensifentrine) + Yupelri (revefenacin) + prednisone 5mg       PM: Pulmicort (budesonide 0.5mg ) + Brovana (aformoterol) + Ohtuvayre (ensifentrine)       EVERY FOUR WEEKS: Tezspire  - Rescue medications: Xopenex (levalbuterol) + Atrovent (ipratropium) mixed together every 4-6 hours as needed via nebulizer - Asthma control goals:  * Full participation in all desired activities (may need albuterol before activity) * Albuterol use two time or less a week on average (not counting use with activity) * Cough interfering with sleep two time or less a month * Oral steroids no more than once a year * No hospitalizations * Minimize medical procedures   2. Allergic rhinitis - Continue with montelukast 10mg  daily.  - Continue with levocetirizine 5mg  daily   3. Return in about 2 months (around 07/22/2023). You can have the follow up appointment with Dr.  Dellis Anes or a Nurse Practicioner (our Nurse Practitioners are excellent and always have Physician oversight!).   Subjective:   Anne Rich is a 72 y.o. female presenting today for follow up of  Chief Complaint  Patient presents with   Allergic Rhinitis    Asthma    States that she's better than she was 2 months ago but that she's not where she wants to be. Thinks the weather may be having an affect on her breathing. Has been wheezing and using her rescue inhaler.    Anne Rich has a history of the following: Patient Active Problem List   Diagnosis Date Noted   Dysarthria 02/22/2023   Hyponatremia 02/22/2023   Long-term corticosteroid use 02/19/2023   Dizziness 02/19/2023   Severe persistent asthma with acute exacerbation 02/05/2023   Seasonal allergic conjunctivitis 02/05/2023   At increased risk of exposure to COVID-19 virus 05/07/2022   Cough 05/07/2022   Current mild episode of major depressive disorder (HCC) 05/23/2021   Ecchymosis 07/27/2020   COVID-19 virus infection 06/20/2019   Medication management 05/29/2019   Severe persistent asthma without complication 03/16/2019   Physical deconditioning 01/06/2019   Counseling regarding end of life decision making 01/06/2019   Lumbosacral dysfunction 09/04/2018   Chronic respiratory failure with hypoxia (HCC), 3L home O2 05/01/2018   Statin intolerance 02/13/2018   Adverse effect of other drugs, medicaments and biological substances, initial encounter 01/01/2018   Bilateral carotid artery stenosis 10/14/2017   Spells of decreased attentiveness 09/10/2017   Cortical age-related cataract of left eye 10/24/2016  Nuclear sclerotic cataract of left eye 10/24/2016   History of epistaxis 07/19/2016   Advance directive in chart 04/04/2016   Idiopathic peripheral neuropathy 03/12/2016   Loss of appetite 03/12/2016   Psychophysiological insomnia 03/12/2016   Oxygen dependent 02/28/2016   Easy bruising 12/26/2015   Gastroesophageal  reflux disease 12/02/2015   Coronary artery calcification seen on CT scan 09/20/2015   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HCC) 09/20/2015   Abnormal weight loss 09/06/2015   Herniated lumbar intervertebral disc 07/21/2015   Migraine without aura and without status migrainosus, not intractable 07/21/2015   Anxiety 06/28/2015   Chronic constipation 06/28/2015   Chronic use of benzodiazepine for therapeutic purpose 06/28/2015   Former smoker 06/28/2015   Hepatic steatosis 06/28/2015   History of cerebrovascular accident (CVA) with residual deficit 06/28/2015   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   Hypoxemia 06/28/2015   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/28/2015   Osteopenia 06/28/2015   Unspecified convulsions (HCC) 06/28/2015   Acute poliomyelitis, unspecified 04/18/2015   CVA (cerebral vascular accident) (HCC) 04/18/2015   Essential hypertension 04/18/2015   Perennial allergic rhinitis 02/10/2015   Asthma-COPD overlap syndrome (HCC) 02/10/2015   Hip pain 07/08/2013   Melena 04/22/2013   Hypercholesterolemia 02/12/2013   Depressive disorder 11/15/2012   Pain disorders related to psychological factors 11/15/2012   Low back pain 06/18/2012   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  Discussed the use of AI scribe software for clinical note transcription with the patient and/or guardian, who gave verbal consent to proceed.  Anne Rich is a 72 y.o. female presenting for a follow up visit.  She was last seen in October 2024.  At that time, lung function looked a little bit better.  We continued with Tezspire monthly as well as Pulmicort and Brovana.  She also continued on Nucala reonce a day.  Her prednisone dose was at 10 mg daily.  We started her on Ohtuvayre (ensifentrine) twice a day and lieu of her Dalisrep.  For her allergic rhinitis, we  continue with montelukast of the levocetirizine.  Since last visit, she has largely done pretty well.    Canary presents with concerns about her breathing. She reports a desire to undergo spirometry testing, but it is unclear if this is due to a change in symptoms or routine monitoring. The patient has recently self-discontinued her long-term prednisone therapy, which she had been taking for several years, due to concerns about weight gain. She was previously on a daily dose of 10mg , but has now ceased the medication entirely.  I did not tell her at the last visit to stop her prednisone.  She did not really ask whether it was appropriate, but we discussed the risk of adrenal crisis today and I recommended that she restart at least 5 mg a day.  Asthma/Respiratory Symptom History: The patient also reports being on a monthly injection (Tezspire) for her asthma, which she believes helps her symptoms. She has also been using her ensifentrine as recommended.  She did stop the Dalisrep. However, she notes that immediately after taking the medication, she experiences wheezing and requires a rescue inhaler. She also mentions a sensation of difficulty breathing, which she describes as feeling "so hard to breathe, you don't want to breathe anymore." This symptom appears to have improved somewhat in the past two months, but the patient feels it is  starting to worsen again.  She also remains on her Pulmicort and Brovana twice a day.  She is on Yupelri once a day.  This combination seems to be working pretty well for her.  Allergic Rhinitis Symptom History: Allergic rhinitis is under good control with montelukast and levocetirizine.  This, nation seems to be doing the trick.  She does not use her nose sprays on a routine basis.  In addition to her respiratory concerns, the patient also mentions a recent change in her personal life, having gotten married four months ago. She expresses some stress related to caring for  her spouse, who has been unwell.   Otherwise, there have been no changes to her past medical history, surgical history, family history, or social history.    Review of systems otherwise negative other than that mentioned in the HPI.    Objective:   Blood pressure (!) 80/60, pulse 74, temperature 97.7 F (36.5 C), temperature source Temporal, resp. rate 12, SpO2 93%. There is no height or weight on file to calculate BMI.      Physical Exam Vitals reviewed.  Constitutional:      Appearance: She is well-developed.     Comments: Very talkative today.  Well coiffed.  HENT:     Head: Normocephalic and atraumatic.     Right Ear: Tympanic membrane, ear canal and external ear normal.     Left Ear: Tympanic membrane, ear canal and external ear normal.     Nose: No nasal deformity, septal deviation, mucosal edema or rhinorrhea.     Right Turbinates: Enlarged, swollen and pale.     Left Turbinates: Enlarged, swollen and pale.     Right Sinus: No maxillary sinus tenderness or frontal sinus tenderness.     Left Sinus: No maxillary sinus tenderness or frontal sinus tenderness.     Comments: No nasal polyps.    Mouth/Throat:     Mouth: Mucous membranes are not pale and not dry.     Pharynx: Uvula midline.  Eyes:     General: Lids are normal. No allergic shiner.       Right eye: No discharge.        Left eye: No discharge.     Conjunctiva/sclera: Conjunctivae normal.     Right eye: Right conjunctiva is not injected. No chemosis.    Left eye: Left conjunctiva is not injected. No chemosis.    Pupils: Pupils are equal, round, and reactive to light.  Cardiovascular:     Rate and Rhythm: Normal rate and regular rhythm.     Heart sounds: Normal heart sounds.  Pulmonary:     Effort: Pulmonary effort is normal. No tachypnea, accessory muscle usage or respiratory distress.     Breath sounds: Examination of the right-middle field reveals wheezing. Examination of the left-middle field reveals  wheezing. Examination of the right-lower field reveals wheezing. Examination of the left-lower field reveals wheezing. Wheezing and rhonchi present. No rales.     Comments: She seems to be at her baseline for her breathing.  She is currently seems to be breathing comfortably and is speaking in sentences, nay paragraphs, without a problem. Chest:     Chest wall: No tenderness.  Lymphadenopathy:     Cervical: No cervical adenopathy.  Skin:    Coloration: Skin is not pale.     Findings: No abrasion, erythema, petechiae or rash. Rash is not papular, urticarial or vesicular.  Neurological:     Mental Status: She is alert.  Psychiatric:  Behavior: Behavior is cooperative.      Diagnostic studies:    Spirometry: results abnormal (FEV1: 0.60/25%, FVC: 1.91/61%, FEV1/FVC: 31%).    Spirometry consistent with mixed obstructive and restrictive disease.  This is very stable compared to her previous exams.    Allergy Studies: none       Malachi Bonds, MD  [facility]

## 2023-05-22 ENCOUNTER — Other Ambulatory Visit: Payer: Self-pay | Admitting: Allergy & Immunology

## 2023-05-23 ENCOUNTER — Telehealth: Payer: Self-pay

## 2023-05-23 ENCOUNTER — Other Ambulatory Visit (HOSPITAL_COMMUNITY): Payer: Self-pay

## 2023-05-23 NOTE — Telephone Encounter (Signed)
Pharmacy Patient Advocate Encounter   Received notification from CoverMyMeds that prior authorization for Levalbuterol HCl 0.63MG /3ML nebulizer solution is required/requested.   Insurance verification completed.   The patient is insured through [facility] .   Per test claim: The current 30 day co-pay is, $1.67.  No PA needed at this time. This test claim was processed through [facility]- copay amounts may vary at other pharmacies due to pharmacy/plan contracts, or as the patient moves through the different stages of their insurance plan.

## 2023-05-29 ENCOUNTER — Telehealth: Payer: Self-pay | Admitting: *Deleted

## 2023-05-29 NOTE — Telephone Encounter (Signed)
Patient called stating that she was out in the rain the other day and has now started having issues with her breathing. She states that she is taking all of her medications and taking prednisone 10mg  daily. I did advise to go ahead and start the levalbuterol and ipratroprium every 4-6 hours. She stated that she is taking mucinex as well. When she is resting her oxygen saturation is 95% but on exertion it drops into the low 80's. I spoke with Chrissie and she advised that the patient needs to go to the emergency room to be evaluated. The phone call got disconnected with the patient and I have called her back three times now and she will not answer the phone. I called and left a voicemail asking for patient to return call to inform.

## 2023-05-29 NOTE — Telephone Encounter (Signed)
Thank you :)

## 2023-05-29 NOTE — Telephone Encounter (Signed)
I called the patient to check on her.  She was feisty has ever and very talkative.  She said that she does not need to go to the hospital and is feeling just fine.  She is using her rescue inhalers a little bit more, but she does not feel that she needs to go to the hospital.  She was making complete sentences. She refused to go to the hospital.  I did confirm that she was taking her prednisone again. She had previously weaned off of it completely. Blood pressures are back in the 120s over 70s per the patient.   She told me that I needed to go to the hospital because I "was crazy for worrying about [her] so much"!

## 2023-05-29 NOTE — Telephone Encounter (Signed)
Called and spoke with the patient and advised that Anne Rich wants her to go to the hospital given that her oxygen saturation has dropped so low. Patient states that she does not want to go to the hospital. I advise that is what the provider recommended and that if she needed anything else to please call us. Patient verbalized understanding.

## 2023-06-06 ENCOUNTER — Ambulatory Visit (INDEPENDENT_AMBULATORY_CARE_PROVIDER_SITE_OTHER): Payer: Medicare Other | Admitting: Physician Assistant

## 2023-06-06 ENCOUNTER — Ambulatory Visit (HOSPITAL_COMMUNITY)
Admission: RE | Admit: 2023-06-06 | Discharge: 2023-06-06 | Disposition: A | Discharge status: Home / Self Care | Payer: Medicare Other | Source: Ambulatory Visit | Attending: Vascular Surgery | Admitting: Vascular Surgery

## 2023-06-06 VITALS — BP 122/86 | HR 78 | Temp 97.5°F | Ht 68.0 in | Wt 157.8 lb

## 2023-06-06 DIAGNOSIS — I6523 Occlusion and stenosis of bilateral carotid arteries: Secondary | ICD-10-CM | POA: Diagnosis present

## 2023-06-06 NOTE — Progress Notes (Signed)
Office Note     CC:  follow up Requesting Provider:  Doreen Salvage, PA-C  HPI: Anne Rich is a 72 y.o. ([birth year]) female who presents for routine follow up of carotid artery stenosis. We have been following her moderate 60-69% left ICA stenosis, minimal 1-39% right ICA stenosis.She does not have any history of TIA or stroke. She has had syncopal events and some dizziness. This has been managed by Neurology as it is suspected to be related to orthostasis.   Today she reports overall she has been feeling well. She does say that about 2-3 months ago she had to go to ER on several occasions via EMS because of slurred speech and confusion. Ultimately they did not find anything acutely but was found to be hyponatremic with elevated Tegretol level per notes.  Ultimately she was discharged with outpatient neurology follow up. She otherwise denies any visual changes, slurred speech, facial drooping, unilateral upper or lower extremity weakness or numbness. She does report that she has been having muscle aches and weakness in her hips and she started to take her statin medication every couple days and this resolved. So she is now taking it every other day. She says she was previously taking a blood thinner but she cannot recall the name. She is not taking Aspirin and she says it causes her to bruise too much. She is on BB and ACE for Hypertension. She is not diabetic. She does have history of seizure disorder. This is also medically managed. She is a former smoker.  Past Medical History:  Diagnosis Date   Allergic rhinitis    Asthma    COPD (chronic obstructive pulmonary disease) (HCC)     Past Surgical History:  Procedure Laterality Date   ABDOMINAL HYSTERECTOMY     APPENDECTOMY     CESAREAN SECTION      Social History   Socioeconomic History   Marital status: Married    Spouse name: Not on file   Number of children: Not on file   Years of education: Not on file   Highest education level:  Not on file  Occupational History   Not on file  Tobacco Use   Smoking status: Former    Current packs/day: 0.00    Average packs/day: 0.5 packs/day for 15.0 years (7.5 ttl pk-yrs)    Types: Cigarettes    Start date: 06/12/1979    Quit date: 06/11/1994    Years since quitting: 29.0    Passive exposure: Past   Smokeless tobacco: Never  Vaping Use   Vaping status: Never Used  Substance and Sexual Activity   Alcohol use: No   Drug use: No   Sexual activity: Not on file  Other Topics Concern   Not on file  Social History Narrative   Not on file   Social Drivers of Health   Financial Resource Strain: Not on file  Food Insecurity: No Food Insecurity (02/22/2023)   Hunger Vital Sign    Worried About Running Out of Food in the Last Year: Never true    Ran Out of Food in the Last Year: Never true  Transportation Needs: No Transportation Needs (02/22/2023)   PRAPARE - Administrator, Civil Service (Medical): No    Lack of Transportation (Non-Medical): No  Physical Activity: Not on file  Stress: Not on file  Social Connections: Not on file  Intimate Partner Violence: Not At Risk (02/22/2023)   Humiliation, Afraid, Rape, and Kick questionnaire    Fear  of Current or Ex-Partner: No    Emotionally Abused: No    Physically Abused: No    Sexually Abused: No    Family History  Problem Relation Age of Onset   Allergic rhinitis Neg Hx    Angioedema Neg Hx    Asthma Neg Hx    Eczema Neg Hx    Immunodeficiency Neg Hx    Urticaria Neg Hx     Current Outpatient Medications  Medication Sig Dispense Refill   acetaminophen (TYLENOL) 500 MG tablet Take 1,000 mg by mouth as needed.     albuterol (PROVENTIL) (2.5 MG/3ML) 0.083% nebulizer solution USE 1 VIAL IN NEBULIZER EVERY 4 TO 6 HOURS AS NEEDED FOR COUGH AND/OR WHEEZE.  Generic:  Ventolin 75 mL 10   albuterol (VENTOLIN HFA) 108 (90 Base) MCG/ACT inhaler Inhale 2 puffs into the lungs every 4 (four) hours as needed for wheezing or  shortness of breath. 54 g 3   arformoterol (BROVANA) 15 MCG/2ML NEBU TAKE 2 MLS (15 MCG TOTAL) BY NEBULIZATION 2 (TWO) TIMES DAILY. 120 mL 5   budesonide (PULMICORT) 0.5 MG/2ML nebulizer solution USE 1 VIAL  IN  NEBULIZER TWICE  DAILY. RINSE MOUTH AFTER TREATMENT. Generic: Pulmicort 120 mL 10   carbamazepine (TEGRETOL) 200 MG tablet Take 200-400 mg by mouth 2 (two) times daily. 200mg  tab in the morning and 400mg  at night     cromolyn (OPTICROM) 4 % ophthalmic solution Place 2 drops into both eyes 4 (four) times daily as needed. (Patient not taking: Reported on 05/21/2023) 10 mL 5   cyclobenzaprine (FLEXERIL) 5 MG tablet Take 5 mg by mouth daily.     diclofenac sodium (VOLTAREN) 1 % GEL Apply 2 g topically 4 (four) times daily as needed (knee pain).  (Patient not taking: Reported on 05/21/2023)     Ensifentrine 3 MG/2.5ML SUSP Inhale 3 mg into the lungs in the morning and at bedtime. 60 mL 11   EPINEPHrine (EPIPEN 2-PAK) 0.3 mg/0.3 mL IJ SOAJ injection Inject 0.3 mg into the muscle Once PRN. (Patient not taking: Reported on 05/21/2023) 1 each 2   estradiol (ESTRACE) 1 MG tablet Take 1 mg by mouth daily.  3   formoterol (PERFOROMIST) 20 MCG/2ML nebulizer solution Take 20 mcg by nebulization 2 (two) times daily.     gabapentin (NEURONTIN) 600 MG tablet Take 600-1,200 mg by mouth 2 (two) times daily. 600mg  in the morning and 1200mg  at bedtime     ipratropium (ATROVENT) 0.02 % nebulizer solution Take 2.5 mLs (0.5 mg total) by nebulization every 4 (four) hours as needed for wheezing or shortness of breath. 75 mL 2   levalbuterol (XOPENEX) 0.63 MG/3ML nebulizer solution Take 3 mLs (0.63 mg total) by nebulization every 4 (four) hours as needed for wheezing or shortness of breath. 540 mL 1   levETIRAcetam (KEPPRA) 1000 MG tablet Take 1,000-2,000 mg by mouth 2 (two) times daily. 1000mg  in the morning and 2000mg  at night     levocetirizine (XYZAL) 5 MG tablet Take 1 tablet (5 mg total) by mouth daily as needed  for allergies (for runny nose). 90 tablet 1   lisinopril (PRINIVIL,ZESTRIL) 10 MG tablet Take 20 mg by mouth daily.     LORazepam (ATIVAN) 1 MG tablet Take 1 mg by mouth 3 (three) times daily.     lovastatin (MEVACOR) 10 MG tablet Take 10 mg by mouth daily.     meclizine (ANTIVERT) 25 MG tablet Take 1 tablet (25 mg total) by mouth 2 (two)  times daily as needed for dizziness. 10 tablet 0   Melatonin 10 MG TABS Take 10 mg by mouth daily.     mirtazapine (REMERON) 30 MG tablet Take 30 mg by mouth every evening.      montelukast (SINGULAIR) 10 MG tablet TAKE 1 TABLET BY MOUTH EVERYDAY AT BEDTIME 90 tablet 1   Multiple Vitamin (MULTIVITAMIN) tablet Take 1 tablet by mouth daily.      omeprazole (PRILOSEC) 40 MG capsule Take 40 mg by mouth 2 (two) times a day.     ondansetron (ZOFRAN) 4 MG tablet TAKE 1 TABLET (4 MG TOTAL) BY MOUTH 2 (TWO) TIMES DAILY AS NEEDED FOR NAUSEA. 20 tablet 1   oxybutynin (DITROPAN) 5 MG tablet Take by mouth. (Patient not taking: Reported on 05/21/2023)     OXYGEN Inhale 3 L into the lungs at bedtime. 4 L when walking     predniSONE (DELTASONE) 10 MG tablet Take 10 mg by mouth daily with breakfast. (Patient not taking: Reported on 05/21/2023)     propranolol (INDERAL) 10 MG tablet Take 20 mg by mouth 2 (two) times daily.     QUEtiapine (SEROQUEL) 25 MG tablet Take 25-50 mg by mouth at bedtime.     revefenacin (YUPELRI) 175 MCG/3ML nebulizer solution Inhale 3 mLs (175 mcg total) into the lungs daily. 90 mL 5   roflumilast (DALIRESP) 500 MCG TABS tablet TAKE 1 TABLET DAILY (Patient not taking: Reported on 05/21/2023) 90 tablet 0   TESSALON PERLES 100 MG capsule Take 1 capsule (100 mg total) by mouth 3 (three) times daily as needed for cough. 20 capsule 0   Current Facility-Administered Medications  Medication Dose Route Frequency Provider Last Rate Last Admin   predniSONE (DELTASONE) tablet 10 mg  10 mg Oral Q breakfast Alfonse Spruce, MD   10 mg at 05/21/23 1304    tezepelumab-ekko (TEZSPIRE) 210 MG/1. syringe 210 mg  210 mg Subcutaneous Q28 days Alfonse Spruce, MD   210 mg at 05/20/23 1046    Allergies  Allergen Reactions   Iodine Itching, Rash and Swelling    IV contrast    Iodine-131 Itching and Swelling    Pt states causes severe itching Pt states causes severe itching    Penicillins Hives and Swelling    Swelling of the throat   Pineapple Other (See Comments) and Swelling    Pt states causes her throat swelling Pt states causes her throat swelling    Shellfish Allergy Rash and Swelling    Had reaction to cardiac cath dye  Had seizure ,rash ,itch Oct. 2012 Had reaction to cardiac cath dye  Had seizure ,rash ,itch Oct. 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   Pravastatin Other (See Comments)    Pt states she couldn't lift her legs to walk   Shellfish-Derived Products    Xanax [Alprazolam]    Iodinated Contrast Media Rash     REVIEW OF SYSTEMS:   [X]  denotes positive finding, [ ]  denotes negative finding Cardiac  Comments:  Chest pain or chest pressure:    Shortness of breath upon exertion:    Short of breath when lying flat:    Irregular heart rhythm:        Vascular    Pain in calf, thigh, or hip brought on by ambulation:    Pain in feet at night that wakes you up from your sleep:  Blood clot in your veins:    Leg swelling:         Pulmonary    Oxygen at home:    Productive cough:     Wheezing:         Neurologic    Sudden weakness in arms or legs:     Sudden numbness in arms or legs:     Sudden onset of difficulty speaking or slurred speech:    Temporary loss of vision in one eye:     Problems with dizziness:         Gastrointestinal    Blood in stool:     Vomited blood:         Genitourinary    Burning when urinating:     Blood in urine:        Psychiatric    Major depression:         Hematologic    Bleeding problems:     Problems with blood clotting too easily:        Skin    Rashes or ulcers:        Constitutional    Fever or chills:      PHYSICAL EXAMINATION:  Vitals:   06/06/23 1318  BP: 122/86  Pulse: 78  Temp: (!) 97.5 F (36.4 C)  SpO2: 94%  Weight: 157 lb 12.8 oz (71.6 kg)  Height: 5\' 8"  (1.727 m)    General:  WDWN in NAD; vital signs documented above Gait: Not observed HENT: WNL, normocephalic Pulmonary: normal non-labored breathing , without wheezing Cardiac: regular HR Abdomen: soft Vascular Exam/Pulses: 2+ radial, 2+ Dp pulses bilaterally Extremities: without ischemic changes, without Gangrene , without cellulitis; without open wounds;  Musculoskeletal: no muscle wasting or atrophy  Neurologic: A&O X 3 Psychiatric:  The pt has Normal affect.   Non-Invasive Vascular Imaging:   VAS US Carotid Duplex: Summary:  Right Carotid: Velocities in the right ICA are consistent with a 1-39% stenosis.   Left Carotid: Velocities in the left ICA are consistent with a 60-79% stenosis (PSV 331 cm/s, EDV 90 cm/s)  Vertebrals: Left vertebral artery demonstrates antegrade flow. Right vertebral  artery was not visualized.  Subclavians: Normal flow hemodynamics were seen in bilateral subclavian arteries.    ASSESSMENT/PLAN:: 72 y.o. female here for follow up for carotid artery stenosis. She has had some confusion and AMS on several occasions over the past several months. I do not think this is related to her carotid stenosis. She has not had any TIA or stroke. She presently is without any associated symptoms.  - Duplex remains essentially unchanged with right ICA 1-39%, left 60-79%. Only slight increase in the velocities since her duplex 6 months ago. Normal flow in the vertebral and subclavian arteries bilaterally - continue statin. Recommend taking Aspirin 81 mg daily -reviewed signs and symptoms of TIA/ Stroke and she understands should these occur to seek immediate medical attention - She  will follow up again in 6 months with repeat carotid duplex   Graceann Congress, PA-C [facility] [phone]  Clinic MD:   Karin Lieu

## 2023-06-07 ENCOUNTER — Ambulatory Visit: Payer: Medicare Other

## 2023-06-07 ENCOUNTER — Encounter (HOSPITAL_COMMUNITY): Payer: Medicare Other

## 2023-06-13 ENCOUNTER — Other Ambulatory Visit: Payer: Self-pay | Admitting: Allergy & Immunology

## 2023-06-17 ENCOUNTER — Ambulatory Visit: Payer: Medicare Other | Admitting: *Deleted

## 2023-06-17 DIAGNOSIS — J4550 Severe persistent asthma, uncomplicated: Secondary | ICD-10-CM | POA: Diagnosis not present

## 2023-06-18 ENCOUNTER — Telehealth: Payer: Self-pay

## 2023-06-18 ENCOUNTER — Other Ambulatory Visit: Payer: Self-pay

## 2023-06-18 ENCOUNTER — Other Ambulatory Visit (HOSPITAL_COMMUNITY): Payer: Self-pay

## 2023-06-18 DIAGNOSIS — I6523 Occlusion and stenosis of bilateral carotid arteries: Secondary | ICD-10-CM

## 2023-06-18 NOTE — Telephone Encounter (Signed)
 Pharmacy Patient Advocate Encounter   Received notification from CoverMyMeds that prior authorization for Ipratropium Bromide  0.02% solution is required/requested.   Insurance verification completed.   The patient is insured through [facility] Medicare .   KEY [id number]  This medication must be processed with PART B insurance. If pharmacy receives rejection, they must contact the number provided on the rejection message or back of patient's card. Thanks!

## 2023-06-24 ENCOUNTER — Other Ambulatory Visit: Payer: Self-pay | Admitting: Pulmonary Disease

## 2023-06-25 ENCOUNTER — Telehealth: Payer: Self-pay | Admitting: Allergy & Immunology

## 2023-06-25 MED ORDER — PREDNISONE 10 MG PO TABS: 20.0000 mg | ORAL_TABLET | Freq: Two times a day (BID) | ORAL | 0 refills | Status: DC

## 2023-06-25 NOTE — Telephone Encounter (Signed)
 I would recommend that she start her prednisone - maybe 20mg  twice daily - until she can be seen in the clinic.   Malachi Bonds, MD [facility]

## 2023-06-25 NOTE — Telephone Encounter (Signed)
 Pt request a call back, she states she has a cold and needs a round of prenisone, she has some at home but is requesting a call back about it.

## 2023-06-25 NOTE — Telephone Encounter (Signed)
 Called and left a message for patient to call our office back to get her scheduled in [place] or [place] sooner than her scheduled follow up to discuss her current health conditions. I have also sent in a ten day supply of prednisone  in hopes that she'll be seen within that time frame. [facility] [address] [facility].

## 2023-06-25 NOTE — Telephone Encounter (Signed)
 Patient out in the bad weather last Friday and Saturday, now she has developed a cough and coughing up yellow sputum. She has some prednisone  on hand she's wanting to know if she can start it. Offered to come to [place] to be seen, but she can't bc her husband has a 2:00 pm Dr.'s appointment today. I offered an appointment for tomorrow, she said she maybe able to come in tomorrow, but she really wanted to start the prednisone  today.

## 2023-06-26 NOTE — Telephone Encounter (Signed)
 I never said I did not want to treat her again. I was just worried about her and wanted her seen sooner rather than later.    I am happy to send in an antibiotic, but she stated to [place] that she wanted to start prednisone . So I went with prednisone .

## 2023-06-26 NOTE — Telephone Encounter (Signed)
 Anne Rich called back upset and stated that if Dr. Idolina Maker does not want to treat her anymore then he needs to just say that.  I asked her what was going on because I didn't know what she was upset for.  She states that she called yesterday with an issue and needed some relief.  I looked up the telephone contact and read to her that Dr. Idolina Maker responded with 20mg  of Prednisone  twice a day and that Anne Rich had called her and left a voicemail letting her know [facility]  Anne Rich claims she didn't get the voicemail.  Anne Rich then stated that 20mg  of Prednisone  twice a day is not enough for her and she needs a higher dose.  She stated 40mg  twice a day doesn't even help.  She stated she doesn't want to use Prednisone  but she has to.  [facility] [address] in [place].

## 2023-06-27 NOTE — Telephone Encounter (Signed)
Forwarding message to provider for clarity:   Patient requesting stronger dose of Prednisone.   Is a stronger dose of Prednisone being sent in as well as an antibiotic?? Or, dose the patient need to be seen?

## 2023-06-28 MED ORDER — PREDNISONE 20 MG PO TABS: ORAL_TABLET | ORAL | 0 refills | Status: DC

## 2023-06-28 NOTE — Telephone Encounter (Signed)
I have patient schedule for Monday 07/01/2023 at 10:30 in our [facility] office with Chrissie.

## 2023-06-28 NOTE — Telephone Encounter (Signed)
I really want her to be seen. Does Anne Rich have anything today? I am in [place].   I can send in a stronger prednisone burst.

## 2023-06-28 NOTE — Addendum Note (Signed)
Addended by: Alfonse Spruce on: 06/28/2023 01:06 PM   Modules accepted: Orders

## 2023-06-28 NOTE — Telephone Encounter (Signed)
No openings today

## 2023-06-30 NOTE — Patient Instructions (Incomplete)
Severe  persistent asthma with COPD overlap: with acute exacerbation - Start the prednisone as prescribed by Dr. Dellis Anes on 06/27/22. The pharmacy has this available to be picked up -Get  STAT chest x-ray due to productive cough. We will call you with results once they are back -If your symptoms do not get better please go to the Emergency Room - Daily controller medication(s):       AM: Pulmicort (budesonide 0.5mg ) + Brovana (aformoterol) + Ohtuvayre (ensifentrine) + Yupelri (revefenacin) + prednisone 10 mg      PM: Pulmicort (budesonide 0.5mg ) + Brovana (aformoterol) + Ohtuvayre (ensifentrine)       EVERY FOUR WEEKS: Tezspire  - Rescue medications: Xopenex (levalbuterol) + Atrovent (ipratropium) mixed together every 4-6 hours as needed via nebulizer - Asthma control goals:  * Full participation in all desired activities (may need albuterol before activity) * Albuterol use two time or less a week on average (not counting use with activity) * Cough interfering with sleep two time or less a month * Oral steroids no more than once a year * No hospitalizations * Minimize medical procedures   2. Allergic rhinitis - Continue with montelukast 10mg  daily.  - Continue with levocetirizine 5mg  daily   3. Keep already scheduled follow up appointment on 07/30/23@ 3 PM with Dr. Dellis Anes.

## 2023-07-01 ENCOUNTER — Ambulatory Visit (HOSPITAL_BASED_OUTPATIENT_CLINIC_OR_DEPARTMENT_OTHER)
Admission: RE | Admit: 2023-07-01 | Discharge: 2023-07-01 | Disposition: A | Discharge status: Home / Self Care | Payer: Medicare Other | Source: Ambulatory Visit | Attending: Family | Admitting: Family

## 2023-07-01 ENCOUNTER — Telehealth: Payer: Self-pay

## 2023-07-01 ENCOUNTER — Ambulatory Visit (INDEPENDENT_AMBULATORY_CARE_PROVIDER_SITE_OTHER): Payer: Medicare Other | Admitting: Family

## 2023-07-01 ENCOUNTER — Encounter: Payer: Self-pay | Admitting: Family

## 2023-07-01 ENCOUNTER — Other Ambulatory Visit: Payer: Self-pay

## 2023-07-01 VITALS — BP 126/68 | HR 84 | Temp 97.7°F | Resp 18 | Ht 68.0 in | Wt 160.0 lb

## 2023-07-01 DIAGNOSIS — J4551 Severe persistent asthma with (acute) exacerbation: Secondary | ICD-10-CM | POA: Diagnosis not present

## 2023-07-01 DIAGNOSIS — J4489 Other specified chronic obstructive pulmonary disease: Secondary | ICD-10-CM

## 2023-07-01 DIAGNOSIS — R058 Other specified cough: Secondary | ICD-10-CM | POA: Diagnosis present

## 2023-07-01 DIAGNOSIS — J3089 Other allergic rhinitis: Secondary | ICD-10-CM

## 2023-07-01 DIAGNOSIS — Z7952 Long term (current) use of systemic steroids: Secondary | ICD-10-CM

## 2023-07-01 DIAGNOSIS — Z9981 Dependence on supplemental oxygen: Secondary | ICD-10-CM

## 2023-07-01 NOTE — Progress Notes (Signed)
[address] [place] [address] Dept: [phone]  FOLLOW UP NOTE  Patient ID: Anne Rich, female    DOB: 07-13-1950  Age: 73 y.o. MRN: [id number] Date of Office Visit: 07/01/2023  Assessment  Chief Complaint: Follow-up (Follow up difficulty breathing)  HPI Mey Sulak is a 73 year old female who presents today for follow-up of severe persistent asthma /COPD overlap, oxygen dependent-followed by pulmonology, chronic prednisone use, perennial allergic rhinitis, gastroesophageal reflux disease, COVID-19 positive in January 2021-status post remdesivir and antibody infusions.  She was last seen on May 21, 2023 by Dr. Dellis Anes.  Her husband is here with her today.  She denies any new diagnosis or surgeries since her last office visit.  Asthma/COPD overlap: She reports for a week or less she has had shortness of breath to where if she gets up off the sofa she has to sit back down.  She feels like something is in her lungs like pneumonia.  She reports a cough with a little bit of productivity with yellow-green sputum.  She also has wheezing and tightness in her chest.  She will feel like her lungs are squeezing.  She will take her Ativan and Toradol and it is not as tight.  She denies nocturnal awakenings, fever, and chills.  She  has a bed that raises her head up.  She is currently taking prednisone 20 mg once a day for the past 3 days.  She was surprised that this was what Dr. Dellis Anes recommended for her flareup.  Discussed how on June 28, 2023 Dr. Dellis Anes prescribed prednisone 20 mg taking 2 tablets twice a day for 4 days, 1 tablet twice a day for 4 days, then 1 tablet daily for 4 days and then stop.  Prior to taking her current dose of prednisone 20 mg once a day, she reports that she was taking prednisone 10 mg daily.  She mentions that she did have a DEXA scan last year and reports that her PCP told her it was fine.  She is currently taking Pulmicort 0.5 mg twice a day, Brovana  twice a day, Ohtuvayre twice a day, and Yupelri once a day.  She also continues to receive Tezspire injections every 4 weeks.  She denies any problems or reactions since her last office visit.  Just recently she started using Xopenex and Atrovent in her nebulizer and reports that it helps for a little while, but she cannot do housework.  She is having to watch her husband do all the work.  She feels tired and like she has no energy.  Also, she feels like being out in the rain because of the symptoms.  Allergic rhinitis: She reports constant clear rhinorrhea and denies nasal congestion and postnasal drip.  She has not been treated for any sinus infections since we last saw her.  She continues to take montelukast 10 mg daily and levocetirizine 5 mg daily.   Drug Allergies:  Allergies  Allergen Reactions   Iodine Itching, Rash and Swelling    IV contrast    Iodine-131 Itching and Swelling    Pt states causes severe itching Pt states causes severe itching    Penicillins Hives and Swelling    Swelling of the throat   Pineapple Other (See Comments) and Swelling    Pt states causes her throat swelling Pt states causes her throat swelling    Shellfish Allergy Rash and Swelling    Had reaction to cardiac cath dye  Had seizure ,rash ,itch  Oct. 2012 Had reaction to cardiac cath dye  Had seizure ,rash ,itch Oct. 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  Constitutional:       Appearance: Normal appearance.  HENT:     Head: Normocephalic and atraumatic.     Right Ear: Tympanic membrane, ear canal and external ear normal.     Left Ear: Tympanic membrane, ear canal and external ear normal.     Nose: Nose normal.     Mouth/Throat:     Mouth: Mucous membranes are moist.     Pharynx: Oropharynx is clear.  Eyes:     Conjunctiva/sclera: Conjunctivae normal.  Cardiovascular:     Rate and Rhythm: Regular rhythm.     Heart sounds: Normal heart sounds.  Pulmonary:     Effort: Pulmonary effort is normal.     Comments: Lungs diminished throughout Musculoskeletal:     Cervical back: Neck supple.  Skin:    General: Skin is warm.  Neurological:     Mental Status: She is alert and oriented to person, place, and time.  Psychiatric:        Mood and Affect: Mood normal.        Behavior: Behavior normal.        Thought Content: Thought content normal.        Judgment: Judgment normal.     Diagnostics: FVC 1.31 L (42%), FEV1 0.37 L (15%), FEV1/FVC 0.28.  Predicted FVC 3.14 L, predicted FEV1 2.39 L.  Spirometry indicates possible very severe obstruction and restriction.  Assessment and Plan: 1. Productive cough   2. Severe persistent asthma with acute exacerbation   3. Asthma-COPD overlap syndrome (HCC)   4. Long-term corticosteroid use   5. Perennial allergic rhinitis   6. Oxygen dependent     No orders of the defined types were placed in this encounter.   Patient Instructions  Severe  persistent asthma with COPD overlap: with acute exacerbation - Start the prednisone as prescribed by Dr. Dellis Anes on 06/27/22. The pharmacy has this available to be picked up -Get  STAT chest x-ray due to productive cough. We will call you with results once they are back -If your symptoms do not get better please go to the Emergency Room - Daily controller medication(s):       AM: Pulmicort (budesonide 0.5mg ) + Brovana (aformoterol) + Ohtuvayre (ensifentrine) + Yupelri  (revefenacin) + prednisone 10 mg      PM: Pulmicort (budesonide 0.5mg ) + Brovana (aformoterol) + Ohtuvayre (ensifentrine)       EVERY FOUR WEEKS: Tezspire  - Rescue medications: Xopenex (levalbuterol) + Atrovent (ipratropium) mixed together every 4-6 hours as needed via nebulizer - Asthma control goals:  * Full participation in all desired activities (may need albuterol before activity) * Albuterol use two time or less a week on  average (not counting use with activity) * Cough interfering with sleep two time or less a month * Oral steroids no more than once a year * No hospitalizations * Minimize medical procedures   2. Allergic rhinitis - Continue with montelukast 10mg  daily.  - Continue with levocetirizine 5mg  daily   3. Keep already scheduled follow up appointment on 07/30/23@ 3 PM with Dr. Dellis Anes.     Return in about 29 days (around 07/30/2023), or if symptoms worsen or fail to improve.    Thank you for the opportunity to care for this patient.  Please do not hesitate to contact me with questions.  Nehemiah Settle, FNP [facility]

## 2023-07-01 NOTE — Telephone Encounter (Signed)
Noted! Thank you

## 2023-07-01 NOTE — Telephone Encounter (Signed)
Noted. Instructed Mrs. Cumpian to pick up prescription from the pharmacy and to let us know if she is not able to get the prescription sent by Dr. Dellis Anes on 06/28/23

## 2023-07-01 NOTE — Telephone Encounter (Signed)
Correction on prescription prednisone 2 tablets twice a day for 4 days 1 tablet twice a day fir 4 days I tablet daily for 4 days then stop Patient aware at correct pharmacy [facility]  Pt doing STAT chest xray and we will call with results and antibiotics depending on findings.

## 2023-07-01 NOTE — Telephone Encounter (Signed)
Spoke to [facility] at [facility] [phone] per [facility] the prescription for prednisone 20mg  bid x 2 days then tier down for a 5 day supply sent in by Dr Dellis Anes has not been picked up and is at the pharmacy

## 2023-07-01 NOTE — Telephone Encounter (Signed)
Received fax from [facility] - DOB verified - Prescription form for Yupelri 175 mcg/3 mL inh.  Form has been placed in provider's in basket to review/complete/sign.  Forwarding message to provider.

## 2023-07-01 NOTE — Progress Notes (Signed)
Please let  Anne Rich know that her chest x-ray does not show any consolidation or effusion. Please pick up and start the prednisone that was prescribed by Dr. Dellis Anes on 06/28/23. Please let us know if you are not getting better.

## 2023-07-02 NOTE — Telephone Encounter (Signed)
Signed paperwork has been faxed to [facility] [phone].

## 2023-07-02 NOTE — Telephone Encounter (Signed)
Thanks

## 2023-07-02 NOTE — Telephone Encounter (Signed)
Signed and returned to nursing staff.

## 2023-07-03 ENCOUNTER — Telehealth: Payer: Self-pay

## 2023-07-03 NOTE — Telephone Encounter (Signed)
Please let Anne Rich know that I spoke with Dr. Dellis Anes and due to her being on sedating medicines such as Ativan and her FEV1 being lower at her last office visit , he would like her to try over the counter cough medicine rather than prescribing the 200  mg tessalon perles. Tessalon perles can cause sedation and dizziness. Did she pick up the prescription of prednisone that was prescribed on 06/28/23 by Dr. Dellis Anes?

## 2023-07-03 NOTE — Telephone Encounter (Signed)
Patient calling stating she has coughed all night and would chrissie call in tessalon perles 200 mg to [employer]. She said the 100 mg she ends up taking 2 of them to help. You saw Shawni on Monday.

## 2023-07-03 NOTE — Telephone Encounter (Signed)
Thank you :)

## 2023-07-03 NOTE — Telephone Encounter (Signed)
Pt did pick up the prednisone and is taking it.

## 2023-07-09 ENCOUNTER — Telehealth: Payer: Self-pay | Admitting: Family

## 2023-07-09 NOTE — Telephone Encounter (Signed)
Patient called back earlier today. Her cough has been going on now for almost 1 1/2 wks. Having a hard time breathing today. She is now coughing up some yellow sputum the last  4 days. She's is on 3L of oxygen at night and through out the day for the last 1 1/2 weeks. She is finishing up a course of prednisone. Patient has some xtra prednisone told her take one more evening and morning of it. She is having some hoarseness, afebrile,no chills, just extremely weak and no appetite. She won't receive her new medicine for the machine until Thursday, ohtuvayre. I Called chrissie, she was already out of the office and she said for Dennice to call the on call Dr. [name] says once she does her breathing treatments, she gets on her oxygen her breathing is better. I made her appoinment with Dr. Marlynn Perking tomorrow at 10:45 she has seen Dr. Marlynn Perking back in 2023. I told Dorsey if her breathing gets worse to go to the emergency room.

## 2023-07-09 NOTE — Telephone Encounter (Signed)
Patient Called stating she is experiencing labored breathing and was told to call back if she continues to have issues she did see Nehemiah Settle FNP on 07-01-2023 patient asking for possible antibiotic RX

## 2023-07-09 NOTE — Telephone Encounter (Signed)
Returning patient's call with no answer. Left her a message to call the office.

## 2023-07-10 ENCOUNTER — Ambulatory Visit: Payer: Medicare Other | Admitting: Internal Medicine

## 2023-07-10 ENCOUNTER — Encounter: Payer: Self-pay | Admitting: Internal Medicine

## 2023-07-10 VITALS — BP 132/74 | HR 94 | Temp 97.7°F | Resp 18

## 2023-07-10 DIAGNOSIS — F419 Anxiety disorder, unspecified: Secondary | ICD-10-CM

## 2023-07-10 DIAGNOSIS — J479 Bronchiectasis, uncomplicated: Secondary | ICD-10-CM | POA: Diagnosis not present

## 2023-07-10 DIAGNOSIS — Z7952 Long term (current) use of systemic steroids: Secondary | ICD-10-CM

## 2023-07-10 DIAGNOSIS — J4551 Severe persistent asthma with (acute) exacerbation: Secondary | ICD-10-CM | POA: Diagnosis not present

## 2023-07-10 DIAGNOSIS — F32A Depression, unspecified: Secondary | ICD-10-CM

## 2023-07-10 DIAGNOSIS — Z9981 Dependence on supplemental oxygen: Secondary | ICD-10-CM | POA: Diagnosis not present

## 2023-07-10 MED ORDER — DOXYCYCLINE MONOHYDRATE 100 MG PO TABS: 100.0000 mg | ORAL_TABLET | Freq: Two times a day (BID) | ORAL | 0 refills | Status: AC

## 2023-07-10 NOTE — Patient Instructions (Addendum)
Severe  persistent asthma with COPD overlap: with acute exacerbation Start doxycyline 100mg  twice daily  Get Chest CT to evaluate for bronchiectasis   -if positive will submit for Vest Physiotherapy   - Immunoglobulins and strep pneuomoccal titers obtains   - consider addition of prophylactic antibiotics if above is positive  -Continue Prednisone 5mg  daily  - Daily controller medication(s):       AM: Pulmicort (budesonide 0.5mg ) + Brovana (aformoterol) + Ohtuvayre (ensifentrine) + Yupelri (revefenacin) + prednisone 10 mg      PM: Pulmicort (budesonide 0.5mg ) + Brovana (aformoterol) + Ohtuvayre (ensifentrine)       EVERY FOUR WEEKS: Tezspire  - Rescue medications: Xopenex (levalbuterol) + Atrovent (ipratropium) mixed together every 4-6 hours as needed via nebulizer - Asthma control goals:  * Full participation in all desired activities (may need albuterol before activity) * Albuterol use two time or less a week on average (not counting use with activity) * Cough interfering with sleep two time or less a month * Oral steroids no more than once a year * No hospitalizations * Minimize medical procedures   2. Allergic rhinitis - Continue with montelukast 10mg  daily.  - Continue with levocetirizine 5mg  daily   3. Keep already scheduled follow up appointment on 07/30/23@ 3 PM with Dr. Dellis Anes.

## 2023-07-10 NOTE — Progress Notes (Signed)
FOLLOW UP Date of Service/Encounter:  07/15/23  Subjective:  Anne Rich (DOB: 10-Jan-1951) is a 73 y.o. female who [facility] in re-evaluation of the following: severe asthma with copd,  History obtained from: chart review and patient and husband  .  For Review, LV was on 07/01/23 with Nehemiah Settle, FNP seen for acute visit for worsening breathing  . See below for summary of history and diagnostics.   Therapeutic plans/changes recommended: recommend patient pick up prednisone prescription ordered by Dr. Dellis Anes on 06/27/13, CXR obtained which showed hyperinflation.   ----------------------------------------------------- Today presents for follow-up. Discussed the use of AI scribe software for clinical note transcription with the patient, who gave verbal consent to proceed.  History of Present Illness   The patient, with chronic lung issues, presents with difficulty breathing and fatigue.  She experiences significant difficulty breathing and fatigue, stating 'I'm so tired. I can't breathe.' She is not on continuous oxygen but uses it as needed, keeping it in her car for quick access. She has been on prednisone for two years but recently stopped it abruptly. This was restarted at last visit with Dr. Dellis Anes.    She has a history of chronic lung issues, including possible bronchiectasis noted on a chest CT from 2021. 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 She describes a lifelong pattern of respiratory infections and has been managing her symptoms with prednisone and other medications.  She was recently in the ER for altered mental status and dizziness, where it was discovered that her Tegretol level was high. Her dosage was adjusted to one tablet at night and half in the morning, and she also takes meclizine to manage dizziness. She mentions bruising easily and  having blood in her urine, for which she is being referred to a specialist.  She has concerns about antibiotic use due to a family history of C. diff infections, but acknowledges that she often requires antibiotics for her respiratory issues. 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       Chart review: Tcon from 07/09/23:  Patient called back earlier today. Her cough has been going on now for almost 1 1/2 wks. Having a hard time breathing today. She is now coughing up some yellow sputum the last  4 days. She's is on 3L of oxygen at night and through out the day for the last 1 1/2 weeks. She is finishing up a course of prednisone. Patient has some xtra prednisone told her take one more evening and morning of it. She is having some hoarseness, afebrile,no chills, just extremely weak and no appetite. She won't receive her new medicine for the machine until Thursday, ohtuvayre. I Called chrissie, she was already out of the office and she said for Karmel to call the on call Dr. [name] says once she does her breathing treatments, she gets on her oxygen her breathing is better. I made her appoinment with Dr. Marlynn Perking tomorrow at 10:45 she has seen Dr. Marlynn Perking back in 2023. I told Khira if her breathing gets worse to go to the emergency room.       CT Chest: showed bronchial wall thickening, and ground glass opacities.    All medications reviewed by clinical staff and updated in chart. No new pertinent medical or surgical history except as  noted in HPI.  ROS: All others negative except as noted per HPI.   Objective:  BP 132/74   Pulse 94   Temp 97.7 F (36.5 C) (Temporal)   Resp 18   SpO2 94%  There is no height or weight on file to calculate BMI. Physical Exam: General Appearance:  Alert, cooperative, no distress, appears stated age  Head:  Normocephalic, without obvious abnormality, atraumatic  Eyes:  Conjunctiva  clear, EOM's intact  Ears EACs normal bilaterally  Nose: Nares normal, normal mucosa, no visible anterior polyps, and septum midline  Throat: Lips, tongue normal; teeth and gums normal, normal posterior oropharynx  Neck: Supple, symmetrical  Lungs:   end-expiratory wheezing, Respirations unlabored, intermittent dry coughing  Heart:  regular rate and rhythm and no murmur, Appears well perfused  Extremities: No edema  Skin: Skin color, texture, turgor normal and no rashes or lesions on visualized portions of skin  Neurologic: No gross deficits   Labs:  Lab Orders         Immunoglobulins, QN, A/E/G/M         Strep pneumoniae 23 Serotypes IgG      Spirometry:  Tracings reviewed. Her effort: Good reproducible efforts. FVC: 1.37L FEV1: 0.50L, 21% predicted FEV1/FVC ratio: 36% Interpretation: Spirometry consistent with mixed obstructive and restrictive disease.  Please see scanned spirometry results for details.    Assessment/Plan   Severe  persistent asthma with COPD overlap: with acute exacerbation Start doxycyline 100mg  twice daily  Get Chest CT to evaluate for bronchiectasis   -if positive will submit for Vest Physiotherapy   - Immunoglobulins and strep pneuomoccal titers obtains   - consider addition of prophylactic antibiotics if above is positive  -Continue Prednisone 5mg  daily  - Daily controller medication(s):       AM: Pulmicort (budesonide 0.5mg ) + Brovana (aformoterol) + Ohtuvayre (ensifentrine) + Yupelri (revefenacin) + prednisone 10 mg      PM: Pulmicort (budesonide 0.5mg ) + Brovana (aformoterol) + Ohtuvayre (ensifentrine)       EVERY FOUR WEEKS: Tezspire  - Rescue medications: Xopenex (levalbuterol) + Atrovent (ipratropium) mixed together every 4-6 hours as needed via nebulizer - Asthma control goals:  * Full participation in all desired activities (may need albuterol before activity) * Albuterol use two time or less a week on average (not counting use with  activity) * Cough interfering with sleep two time or less a month * Oral steroids no more than once a year * No hospitalizations * Minimize medical procedures   2. Allergic rhinitis - Continue with montelukast 10mg  daily.  - Continue with levocetirizine 5mg  daily   3. Keep already scheduled follow up appointment on 07/30/23@ 3 PM with Dr. Dellis Anes.   Other:     Thank you so much for letting me partake in your care today.  Don't hesitate to reach out if you have any additional concerns!  Ferol Luz, MD  [facility] [place]

## 2023-07-11 ENCOUNTER — Ambulatory Visit (HOSPITAL_BASED_OUTPATIENT_CLINIC_OR_DEPARTMENT_OTHER)
Admission: RE | Admit: 2023-07-11 | Discharge: 2023-07-11 | Disposition: A | Discharge status: Home / Self Care | Payer: Medicare Other | Source: Ambulatory Visit | Attending: Internal Medicine | Admitting: Internal Medicine

## 2023-07-11 DIAGNOSIS — J479 Bronchiectasis, uncomplicated: Secondary | ICD-10-CM | POA: Insufficient documentation

## 2023-07-15 ENCOUNTER — Ambulatory Visit: Payer: Medicare Other

## 2023-07-15 DIAGNOSIS — J4550 Severe persistent asthma, uncomplicated: Secondary | ICD-10-CM

## 2023-07-15 DIAGNOSIS — J4551 Severe persistent asthma with (acute) exacerbation: Secondary | ICD-10-CM

## 2023-07-15 MED ORDER — PREDNISONE 5 MG PO TABS: 5.0000 mg | ORAL_TABLET | Freq: Every day | ORAL | 30 refills | Status: DC

## 2023-07-15 MED ORDER — MONTELUKAST SODIUM 10 MG PO TABS: ORAL_TABLET | ORAL | 1 refills | Status: DC

## 2023-07-15 MED ORDER — LEVOCETIRIZINE DIHYDROCHLORIDE 5 MG PO TABS: 5.0000 mg | ORAL_TABLET | Freq: Every day | ORAL | 1 refills | Status: DC | PRN

## 2023-07-18 LAB — IMMUNOGLOBULINS A/E/G/M, SERUM
IgA/Immunoglobulin A, Serum: 124 mg/dL (ref 64–422)
IgE (Immunoglobulin E), Serum: 63 [IU]/mL (ref 6–495)
IgG (Immunoglobin G), Serum: 663 mg/dL (ref 586–1602)
IgM (Immunoglobulin M), Srm: 53 mg/dL (ref 26–217)

## 2023-07-18 LAB — STREP PNEUMONIAE 23 SEROTYPES IGG
Pneumo Ab Type 1*: 0.1 ug/mL — ABNORMAL LOW (ref >1.3)
Pneumo Ab Type 12 (12F)*: 0.4 ug/mL — ABNORMAL LOW (ref >1.3)
Pneumo Ab Type 17 (17F)*: 1.5 ug/mL (ref >1.3)
Pneumo Ab Type 19 (19F)*: 5.5 ug/mL (ref >1.3)
Pneumo Ab Type 2*: 4.3 ug/mL (ref >1.3)
Pneumo Ab Type 20*: 2.3 ug/mL (ref >1.3)
Pneumo Ab Type 22 (22F)*: 0.5 ug/mL — ABNORMAL LOW (ref >1.3)
Pneumo Ab Type 23 (23F)*: 8.3 ug/mL (ref >1.3)
Pneumo Ab Type 26 (6B)*: 0.4 ug/mL — ABNORMAL LOW (ref >1.3)
Pneumo Ab Type 3*: 0.7 ug/mL — ABNORMAL LOW (ref >1.3)
Pneumo Ab Type 34 (10A)*: 5.2 ug/mL (ref >1.3)
Pneumo Ab Type 4*: 1.8 ug/mL (ref >1.3)
Pneumo Ab Type 43 (11A)*: 0.6 ug/mL — ABNORMAL LOW (ref >1.3)
Pneumo Ab Type 5*: 7.5 ug/mL (ref >1.3)
Pneumo Ab Type 51 (7F)*: 1.4 ug/mL (ref >1.3)
Pneumo Ab Type 54 (15B)*: 9.6 ug/mL (ref >1.3)
Pneumo Ab Type 56 (18C)*: 1.1 ug/mL — ABNORMAL LOW (ref >1.3)
Pneumo Ab Type 57 (19A)*: 3.7 ug/mL (ref >1.3)
Pneumo Ab Type 68 (9V)*: 2.8 ug/mL (ref >1.3)
Pneumo Ab Type 70 (33F)*: 2 ug/mL (ref >1.3)
Pneumo Ab Type 8*: 12.6 ug/mL (ref >1.3)
Pneumo Ab Type 9 (9N)*: 0.7 ug/mL — ABNORMAL LOW (ref >1.3)

## 2023-07-19 ENCOUNTER — Telehealth: Payer: Self-pay

## 2023-07-19 NOTE — Telephone Encounter (Signed)
 Pt called in and stated she is not feeling any better her oxygen  is running low. She is barely able to walk due to difficulty breathing . Short of breath her whole life, she is struggling to breath. She dosent not sure what else to do as she is doing all recommendations

## 2023-07-24 NOTE — Telephone Encounter (Signed)
Goodness. Can someone call and see how she is feeling now?   Malachi Bonds, MD [facility]

## 2023-07-25 NOTE — Telephone Encounter (Signed)
Left message for pt to call us back about this issue

## 2023-07-30 ENCOUNTER — Encounter: Payer: Self-pay | Admitting: Allergy & Immunology

## 2023-07-30 ENCOUNTER — Ambulatory Visit (INDEPENDENT_AMBULATORY_CARE_PROVIDER_SITE_OTHER): Payer: Medicare Other | Admitting: Allergy & Immunology

## 2023-07-30 ENCOUNTER — Other Ambulatory Visit: Payer: Self-pay

## 2023-07-30 VITALS — BP 148/82 | HR 101 | Temp 97.8°F | Resp 12

## 2023-07-30 DIAGNOSIS — Z7952 Long term (current) use of systemic steroids: Secondary | ICD-10-CM

## 2023-07-30 DIAGNOSIS — J479 Bronchiectasis, uncomplicated: Secondary | ICD-10-CM

## 2023-07-30 DIAGNOSIS — J3089 Other allergic rhinitis: Secondary | ICD-10-CM

## 2023-07-30 DIAGNOSIS — J4550 Severe persistent asthma, uncomplicated: Secondary | ICD-10-CM | POA: Diagnosis not present

## 2023-07-30 DIAGNOSIS — K219 Gastro-esophageal reflux disease without esophagitis: Secondary | ICD-10-CM

## 2023-07-30 NOTE — Patient Instructions (Addendum)
Severe  persistent asthma with COPD overlap: with acute exacerbation - Lung testing was 19% today. - Talk to Dr. [name] about adding oxygen to your regimen to help with your tolerance of activities of daily living.  - I will make sure that he sees results of the chest CT since we updated this.  - Daily controller medication(s):       AM: Pulmicort (budesonide 0.5mg ) + Brovana (aformoterol) + Ohtuvayre (ensifentrine) + Yupelri (revefenacin) + prednisone 10 mg      PM: Pulmicort (budesonide 0.5mg ) + Brovana (aformoterol) + Ohtuvayre (ensifentrine)       EVERY FOUR WEEKS: Tezspire  - Rescue medications: Xopenex (levalbuterol) + Atrovent (ipratropium) mixed together every 4-6 hours as needed via nebulizer - Asthma control goals:  * Full participation in all desired activities (may need albuterol before activity) * Albuterol use two time or less a week on average (not counting use with activity) * Cough interfering with sleep two time or less a month * Oral steroids no more than once a year * No hospitalizations * Minimize medical procedures   2. Allergic rhinitis - Continue with montelukast 10mg  daily.  - Continue with levocetirizine 5mg  daily  3. Return in about 3 months (around 10/27/2023). You can have the follow up appointment with Dr. Dellis Anes or a Nurse Practicioner (our Nurse Practitioners are excellent and always have Physician oversight!).    Please inform us of any Emergency Department visits, hospitalizations, or changes in symptoms. Call us before going to the ED for breathing or allergy symptoms since we might be able to fit you in for a sick visit. Feel free to contact us anytime with any questions, problems, or concerns.  It was a pleasure to see you again today and meet your hubs!   Websites that have reliable patient information: 1. American Academy of Asthma, Allergy, and Immunology: www.aaaai.org 2. Food Allergy Research and Education (FARE): foodallergy.org 3. Mothers  of Asthmatics: http://www.asthmacommunitynetwork.org 4. American College of Allergy, Asthma, and Immunology: www.acaai.org      "Like" [place] on Facebook and Instagram for our latest updates!      A healthy democracy works best when [employer] participate! Make sure you are registered to vote! If you have moved or changed any of your contact information, you will need to get this updated before voting! Scan the QR codes below to learn more!

## 2023-07-30 NOTE — Progress Notes (Unsigned)
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 - Talk to Dr. [name] about adding oxygen to your regimen to help with your tolerance of activities of daily living.  - I will make sure that he sees results of the chest CT since we updated this.  - Daily controller medication(s):       AM: Pulmicort (budesonide 0.5mg ) + Brovana (aformoterol) + Ohtuvayre (ensifentrine) + Yupelri (revefenacin) + prednisone 10 mg      PM: Pulmicort (budesonide 0.5mg ) + Brovana (aformoterol) + Ohtuvayre (ensifentrine)       EVERY FOUR WEEKS: Tezspire  - Rescue medications: Xopenex (levalbuterol) + Atrovent (ipratropium) mixed together every 4-6 hours as needed via nebulizer - Asthma control goals:  * Full participation in all desired activities (may need albuterol before activity) * Albuterol use two time or less a week on average (not counting use with activity) * Cough interfering with sleep two time or less a month * Oral steroids no more than once a year * No hospitalizations * Minimize medical procedures   2. Allergic rhinitis - Continue with montelukast 10mg  daily.  - Continue with levocetirizine 5mg  daily  3. Return in about 3 months (around 10/27/2023). You can have the follow up appointment with Dr. Dellis Anes or a Nurse Practicioner (our Nurse Practitioners are excellent and always have Physician  oversight!).   Subjective:   Anne Rich is a 73 y.o. female presenting today for follow up of  Chief Complaint  Patient presents with   Breathing Problem    Anne Rich has a history of the following: Patient Active Problem List   Diagnosis Date Noted   Dysarthria 02/22/2023   Hyponatremia 02/22/2023   Long-term corticosteroid use 02/19/2023   Dizziness 02/19/2023   Severe persistent asthma with acute exacerbation 02/05/2023   Seasonal allergic conjunctivitis 02/05/2023   At increased risk of exposure to COVID-19 virus 05/07/2022   Cough 05/07/2022   Current mild episode of major depressive disorder (HCC) 05/23/2021   Ecchymosis 07/27/2020   COVID-19 virus infection 06/20/2019   Medication management 05/29/2019   Severe persistent asthma without complication 03/16/2019   Physical deconditioning 01/06/2019   Counseling regarding end of life decision making 01/06/2019   Lumbosacral dysfunction 09/04/2018   Chronic respiratory failure with hypoxia (HCC), 3L home O2 05/01/2018   Statin intolerance 02/13/2018   Adverse effect of other drugs, medicaments and biological substances, initial encounter 01/01/2018   Bilateral carotid artery stenosis 10/14/2017   Spells of decreased attentiveness 09/10/2017   Cortical age-related cataract of left eye 10/24/2016   Nuclear sclerotic cataract of left eye 10/24/2016   History of epistaxis 07/19/2016   Advance directive in chart 04/04/2016   Idiopathic peripheral neuropathy 03/12/2016   Loss of appetite 03/12/2016   Psychophysiological insomnia 03/12/2016   Oxygen dependent 02/28/2016   Easy bruising 12/26/2015   Gastroesophageal reflux disease 12/02/2015   Coronary artery calcification seen on CT scan 09/20/2015  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HCC) 09/20/2015   Abnormal weight loss 09/06/2015   Herniated lumbar intervertebral disc 07/21/2015   Migraine without aura and without status migrainosus, not intractable 07/21/2015   Anxiety 06/28/2015    Chronic constipation 06/28/2015   Chronic use of benzodiazepine for therapeutic purpose 06/28/2015   Former smoker 06/28/2015   Hepatic steatosis 06/28/2015   History of cerebrovascular accident (CVA) with residual deficit 06/28/2015   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   Hypoxemia 06/28/2015   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/28/2015   Osteopenia 06/28/2015   Unspecified convulsions (HCC) 06/28/2015   Acute poliomyelitis, unspecified 04/18/2015   CVA (cerebral vascular accident) (HCC) 04/18/2015   Essential hypertension 04/18/2015   Perennial allergic rhinitis 02/10/2015   Asthma-COPD overlap syndrome (HCC) 02/10/2015   Hip pain 07/08/2013   Melena 04/22/2013   Hypercholesterolemia 02/12/2013   Depressive disorder 11/15/2012   Pain disorders related to psychological factors 11/15/2012   Low back pain 06/18/2012   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   Discussed the use of AI scribe software for clinical note transcription with the patient and/or guardian, who gave verbal consent to proceed.  Anne Rich is a 73 y.o. female presenting for a follow up visit. She was last seen in January 2025 by Dr. Marlynn Perking.  At that time, she was started on doxycycline 100 mg twice a day.  She did get a chest CT to evaluate for bronchiectasis.  She obtained an immune workup and discuss starting prophylactic antibiotics.  She was continued on prednisone 5 mg daily as well as her nebulized medication regimen.  She also remained on her Tezspire every month.  Her immune workup looks good at the last visit.  We have done this in the past and it has been within normal limits.  She did have a chest CT that looked stable.  And noted emphysema with occasional bullous changes and chronic lung  scarring.  FINDINGS: Cardiovascular: Calcified aortic atherosclerosis. Calcified coronary artery atherosclerosis on series 301, image 73. Heart size remains normal. Vascular patency is not evaluated in the absence of IV contrast. No pericardial effusion.   Mediastinum/Nodes: Negative noncontrast mediastinum. No evidence of mass or lymphadenopathy.   Lungs/Pleura: Chronic centrilobular emphysema (series 302, image 33 in the upper lobes). Lung volumes have mildly increased since 2021. Stable associated occasional chronic lung scarring and architectural distortion (including the lingula with chronic bullous changes there series 302, image 99).   Major airways are patent with some atelectatic changes, mild respiratory motion.   No pleural effusion. No consolidation. Punctate anterior basal segment right lower lobe calcified granuloma is stable and benign on series 302, image 88. Clustered small postinflammatory appearing distal peribronchial nodularity in the right lower lobe on series 302 images 88 through 91. No suspicious lung nodule. No active pulmonary inflammation identified.   Upper Abdomen: Negative visible noncontrast liver, gallbladder, spleen, pancreas, adrenal glands and kidneys. Negative visible bowel. No upper abdominal free air or free fluid.   Musculoskeletal: Osteopenia. Chronic left lateral 6th and 7th rib fractures. No acute or suspicious osseous lesion identified.   IMPRESSION: 1. Emphysema (ICD10-J43.9). Occasional Bullous changes, chronic lung scarring. No acute or suspicious pulmonary finding. 2. Calcified coronary artery, Aortic Atherosclerosis (ICD10-I70.0).  Over the past six months, she has experienced worsening breathing difficulties, characterized by shortness of breath with minimal  exertion and a sensation of gasping for air. She has a history of emphysema, chronic lung scarring, and occasional bullous changes noted on a chest CT. She was diagnosed  with emphysema in 2020 and feels she was not adequately informed about her condition at that time.  I am certain that we have discussed emphysema in the past.  Regardless, she feels like she did not know enough about it.  She remains on her nebulized medication regimen including Pulmicort and Brovana twice daily as well as Ohtuvayre twice daily.  She also is on Yupelri once in the middle of the day.  She remains on prednisone 10 mg daily.  She has been on chronic prednisone for several years at this point.  She also remains on her Tezspire monthly.  Previously she has been on Xolair as well as Nucala.  Despite all of this, she reports quite a bit of air hunger.  She has to take breaks throughout the day to do nebulizer treatments.  She feels like her energy is a lot less than it used to be.  She is wondering about including hospice in her care.  Of note, we have referred her to hospice on a couple of occasions per her request and she became very angry with [place] for doing that.  She does still have a phone number for hospice and will be given them a call.  She expresses frustration with hospice care, noting restrictions on personal care such as nail polishing and the use of rescue inhalers. She previously contacted hospice but was told she did not qualify. She is worried about managing her symptoms at home and is contemplating reaching out to hospice again.  She continues to follow with pulmonology.  Her last visit was in December 2024 with Dr. Isaiah Serge.  At that time, she received her flu vaccine.  She was having a lot of falls and dizziness.  He felt this was related to her elevated Tegretol levels.  It was recommended that she wean her Tegretol under close supervision with her neurologist.  She is on Tegretol for her history of seizures.  She lives in an apartment without a personal washer and dryer, requiring the use of a communal laundry room. She is married to Axis, whom she has known for four years and  married six months ago. No history of COVID-19, but she recently recovered from the flu and notes bruising on her hands from blood draws and an injury while doing laundry.   Otherwise, there have been no changes to her past medical history, surgical history, family history, or social history.    Review of systems otherwise negative other than that mentioned in the HPI.    Objective:   Blood pressure (!) 148/82, pulse (!) 101, temperature 97.8 F (36.6 C), temperature source Temporal, resp. rate 12, SpO2 (!) 89%. There is no height or weight on file to calculate BMI.    Physical Exam Vitals reviewed.  Constitutional:      Appearance: She is well-developed.     Comments: Very talkative today.  Well coiffed. Making full sentences.   HENT:     Head: Normocephalic and atraumatic.     Right Ear: Tympanic membrane, ear canal and external ear normal.     Left Ear: Tympanic membrane, ear canal and external ear normal.     Nose: No nasal deformity, septal deviation, mucosal edema or rhinorrhea.     Right Turbinates: Enlarged, swollen and pale.     Left Turbinates:  Enlarged, swollen and pale.     Right Sinus: No maxillary sinus tenderness or frontal sinus tenderness.     Left Sinus: No maxillary sinus tenderness or frontal sinus tenderness.     Comments: No nasal polyps.    Mouth/Throat:     Mouth: Mucous membranes are not pale and not dry.     Pharynx: Uvula midline.  Eyes:     General: Lids are normal. No allergic shiner.       Right eye: No discharge.        Left eye: No discharge.     Conjunctiva/sclera: Conjunctivae normal.     Right eye: Right conjunctiva is not injected. No chemosis.    Left eye: Left conjunctiva is not injected. No chemosis.    Pupils: Pupils are equal, round, and reactive to light.  Cardiovascular:     Rate and Rhythm: Normal rate and regular rhythm.     Heart sounds: Normal heart sounds.  Pulmonary:     Effort: Pulmonary effort is normal. No tachypnea,  accessory muscle usage or respiratory distress.     Breath sounds: Examination of the right-middle field reveals wheezing. Examination of the left-middle field reveals wheezing. Examination of the right-lower field reveals wheezing. Examination of the left-lower field reveals wheezing. Wheezing and rhonchi present. No rales.     Comments: She seems to be at her baseline for her breathing.  She is currently seems to be breathing comfortably and is speaking in paragraphs, without a problem. Chest:     Chest wall: No tenderness.  Lymphadenopathy:     Cervical: No cervical adenopathy.  Skin:    General: Skin is warm.     Capillary Refill: Capillary refill takes less than 2 seconds.     Coloration: Skin is not pale.     Findings: No abrasion, erythema, petechiae or rash. Rash is not papular, urticarial or vesicular.  Neurological:     Mental Status: She is alert.  Psychiatric:        Behavior: Behavior is cooperative.      Diagnostic studies:    Spirometry: results abnormal (FEV1: 0.46/19%, FVC: 1.49/47%, FEV1/FVC: 31%).    Spirometry consistent with mixed obstructive and restrictive disease. This is not her worst FEV1, but definitely not her best.    Allergy Studies: none       Malachi Bonds, MD  [facility]

## 2023-08-01 ENCOUNTER — Encounter: Payer: Self-pay | Admitting: Allergy & Immunology

## 2023-08-12 ENCOUNTER — Ambulatory Visit (INDEPENDENT_AMBULATORY_CARE_PROVIDER_SITE_OTHER): Payer: Medicare Other

## 2023-08-12 DIAGNOSIS — J4550 Severe persistent asthma, uncomplicated: Secondary | ICD-10-CM | POA: Diagnosis not present

## 2023-08-14 ENCOUNTER — Telehealth: Payer: Self-pay | Admitting: Pulmonary Disease

## 2023-08-14 NOTE — Telephone Encounter (Signed)
 Please fax [phone] Attn: Marcelino Duster  Pharmacy only got half of a letter of medical ness that was fax'd yesterday. Please refax.

## 2023-08-15 NOTE — Telephone Encounter (Signed)
 If this needs to be re faxed, then the front office should have it. Refax this to the pharmacy.

## 2023-08-19 NOTE — Telephone Encounter (Signed)
 Disregard

## 2023-09-06 ENCOUNTER — Other Ambulatory Visit: Payer: Self-pay | Admitting: Allergy & Immunology

## 2023-09-09 ENCOUNTER — Ambulatory Visit (INDEPENDENT_AMBULATORY_CARE_PROVIDER_SITE_OTHER)

## 2023-09-09 DIAGNOSIS — J4550 Severe persistent asthma, uncomplicated: Secondary | ICD-10-CM

## 2023-09-12 ENCOUNTER — Telehealth: Payer: Self-pay | Admitting: Allergy & Immunology

## 2023-09-12 MED ORDER — MONTELUKAST SODIUM 10 MG PO TABS: ORAL_TABLET | ORAL | 1 refills | Status: DC

## 2023-09-12 NOTE — Telephone Encounter (Signed)
 [facility] request a refill for montelukast to be sent to [facility] [address], [place].

## 2023-09-12 NOTE — Telephone Encounter (Signed)
 Sent rx to requested pharmacy.

## 2023-10-07 ENCOUNTER — Ambulatory Visit (INDEPENDENT_AMBULATORY_CARE_PROVIDER_SITE_OTHER)

## 2023-10-07 DIAGNOSIS — J4550 Severe persistent asthma, uncomplicated: Secondary | ICD-10-CM

## 2023-11-03 ENCOUNTER — Other Ambulatory Visit: Payer: Self-pay | Admitting: Allergy & Immunology

## 2023-11-05 ENCOUNTER — Ambulatory Visit (INDEPENDENT_AMBULATORY_CARE_PROVIDER_SITE_OTHER)

## 2023-11-05 DIAGNOSIS — J4550 Severe persistent asthma, uncomplicated: Secondary | ICD-10-CM | POA: Diagnosis not present

## 2023-11-18 ENCOUNTER — Telehealth: Payer: Self-pay | Admitting: Allergy & Immunology

## 2023-11-18 NOTE — Telephone Encounter (Signed)
 [facility] called and stated that Anne Rich is in need of a nebulizer. Call back number [phone], fax number [phone].

## 2023-11-21 ENCOUNTER — Telehealth: Payer: Self-pay | Admitting: Allergy & Immunology

## 2023-11-21 MED ORDER — PREDNISONE 10 MG PO TABS: 10.0000 mg | ORAL_TABLET | Freq: Every day | ORAL | 0 refills | Status: AC

## 2023-11-21 MED ORDER — NEBULIZER MISC
1.0000 | 1 refills | Status: AC | PRN
Start: 2023-11-21 — End: ?

## 2023-11-21 NOTE — Telephone Encounter (Signed)
 Anne Rich called and stated that she needs extra refills called in for her prednisone  10mg . As she is soon to run  out. She states [facility] has been trying to reach out on her behalf.

## 2023-11-21 NOTE — Telephone Encounter (Signed)
 Called and spoke to patient and informed her of the prednisone  refill.

## 2023-11-21 NOTE — Telephone Encounter (Signed)
 Nebulizer has been sent in.

## 2023-12-03 ENCOUNTER — Other Ambulatory Visit: Payer: Self-pay

## 2023-12-03 ENCOUNTER — Ambulatory Visit (INDEPENDENT_AMBULATORY_CARE_PROVIDER_SITE_OTHER)

## 2023-12-03 DIAGNOSIS — J4550 Severe persistent asthma, uncomplicated: Secondary | ICD-10-CM | POA: Diagnosis not present

## 2023-12-03 MED ORDER — MONTELUKAST SODIUM 10 MG PO TABS: ORAL_TABLET | ORAL | 1 refills | Status: DC

## 2023-12-31 ENCOUNTER — Ambulatory Visit (INDEPENDENT_AMBULATORY_CARE_PROVIDER_SITE_OTHER)

## 2023-12-31 DIAGNOSIS — J4550 Severe persistent asthma, uncomplicated: Secondary | ICD-10-CM | POA: Diagnosis not present

## 2024-01-28 ENCOUNTER — Ambulatory Visit

## 2024-01-28 DIAGNOSIS — J4550 Severe persistent asthma, uncomplicated: Secondary | ICD-10-CM

## 2024-02-22 ENCOUNTER — Other Ambulatory Visit: Payer: Self-pay | Admitting: Allergy & Immunology

## 2024-02-25 ENCOUNTER — Ambulatory Visit (INDEPENDENT_AMBULATORY_CARE_PROVIDER_SITE_OTHER): Admitting: Internal Medicine

## 2024-02-25 ENCOUNTER — Encounter: Payer: Self-pay | Admitting: Internal Medicine

## 2024-02-25 ENCOUNTER — Ambulatory Visit

## 2024-02-25 VITALS — BP 130/72 | HR 92 | Temp 97.0°F | Resp 20 | Wt 157.0 lb

## 2024-02-25 DIAGNOSIS — J9611 Chronic respiratory failure with hypoxia: Secondary | ICD-10-CM | POA: Diagnosis not present

## 2024-02-25 DIAGNOSIS — Z7952 Long term (current) use of systemic steroids: Secondary | ICD-10-CM

## 2024-02-25 DIAGNOSIS — J4550 Severe persistent asthma, uncomplicated: Secondary | ICD-10-CM

## 2024-02-25 DIAGNOSIS — J439 Emphysema, unspecified: Secondary | ICD-10-CM

## 2024-02-25 DIAGNOSIS — J479 Bronchiectasis, uncomplicated: Secondary | ICD-10-CM | POA: Diagnosis not present

## 2024-02-25 DIAGNOSIS — Z8639 Personal history of other endocrine, nutritional and metabolic disease: Secondary | ICD-10-CM

## 2024-02-25 MED ORDER — LEVOCETIRIZINE DIHYDROCHLORIDE 5 MG PO TABS: 5.0000 mg | ORAL_TABLET | Freq: Every evening | ORAL | 1 refills | Status: AC

## 2024-02-25 MED ORDER — MONTELUKAST SODIUM 10 MG PO TABS: ORAL_TABLET | ORAL | 1 refills | Status: AC

## 2024-02-25 NOTE — Progress Notes (Signed)
 FOLLOW UP Date of Service/Encounter:  02/25/24  Subjective:  Anne Rich (DOB: 1950-11-17) is a 73 y.o. female [facility] in re-evaluation of the following: severe asthma with copd,  History obtained from: chart review and patient and husband .  For Review, LV was on 08/09/23 with Dr. Iva seen for routine follow-up. See below for summary of history and diagnostics.   Chart review: received prednisone  burst 01/27/24, 12/23/23 (+doxy)  from pulm   Pulm visit 11/25/23:  ----------------------------------------------------- Today presents for follow-up. Discussed the use of AI scribe software for clinical note transcription with the patient, who gave verbal consent to proceed.  Discussed the use of AI scribe software for clinical note transcription with the patient, who gave verbal consent to proceed.  History of Present Illness Anne Rich is a 73 year old female who presents for follow-up after hospitalization for respiratory failure and carbon monoxide poisoning.  Respiratory failure and carbon monoxide poisoning - Hospitalized in February for respiratory failure and Co2 retention  - No recollection of events leading up to hospitalization - Required BiPAP support during hospitalization - Experienced nausea during hospitalization - Did not receive home care instructions at discharge, resulting in confusion regarding oxygen  equipment - Discovered oxygen  filter was secured with scotch tape and subsequently purchased new equipment  Medication-related adverse effects - Attempted to wean off Ativan  due to negative impact on breathing - Discontinued several sedating medications, resulting in tremors that impaired ability to write  - Developed dizziness after starting a new antidepressant, which also caused muscle effects similar to prior experience with [employer], leading to discontinuation of the antidepressant  -medical management being  coordinated by neuro, pulm and pcp   Asthma and allergy management - Currently taking Tezspire , montelukast , and levocetirizine for asthma and allergies - Pulm is managing prednisone  and oxygen  (uses at night and with exertion)   Patient preferences for care - Prefers to avoid invasive procedures and hospitalizations - Desires to manage conditions at home - Has communicated wishes to family and caregivers to avoid hospital transfer and certain treatments - does not want to do spirometry anymore as it contributes to anxiety  -has been referred to hospice, but is not interested in pursuing         All medications reviewed by clinical staff and updated in chart. No new pertinent medical or surgical history except as noted in HPI.  ROS: All others negative except as noted per HPI.   Objective:  BP 130/72 (BP Location: Left Arm, Patient Position: Sitting)   Pulse 92   Temp (!) 97 F (36.1 C) (Temporal)   Resp 20   Wt 157 lb (71.2 kg)   SpO2 93%   BMI 23.87 kg/m  Body mass index is 23.87 kg/m. Physical Exam: General Appearance:  Alert, cooperative, no distress, appears stated age  Head:  Normocephalic, without obvious abnormality, atraumatic  Eyes:  Conjunctiva clear, EOM's intact  Ears EACs normal bilaterally  Nose: Nares normal, normal mucosa, no visible anterior polyps, and septum midline  Throat: Lips, tongue normal; teeth and gums normal, normal posterior oropharynx  Neck: Supple, symmetrical  Lungs:   end-expiratory wheezing, Respirations unlabored, no coughing  Heart:  regular rate and rhythm and no murmur, Appears well perfused  Extremities: No edema  Skin: Skin color, texture, turgor normal and no rashes or lesions on visualized portions of skin  Neurologic: No gross deficits   Labs:  Lab Orders  No laboratory test(s)  ordered today       Assessment/Plan   Patient Instructions  Severe  persistent asthma with COPD overla and hypoxemic respiratory failure  and CO2 rentention  - Continue to follow with Dr. McQuaid as he is the main manager of your asthma/COPD now   - Daily controller medication(s):       AM: Pulmicort  (budesonide  0.5mg ) + Brovana  (aformoterol) + Ohtuvayre  (ensifentrine ) + Yupelri  (revefenacin ) + prednisone  10 mg      PM: Pulmicort  (budesonide  0.5mg ) + Brovana  (aformoterol) + Ohtuvayre  (ensifentrine )       EVERY FOUR WEEKS: Tezspire   - Rescue medications: Xopenex  (levalbuterol ) + Atrovent  (ipratropium) mixed together every 4-6 hours as needed via nebulizer - Asthma control goals:  * Full participation in all desired activities (may need albuterol  before activity) * Albuterol  use two time or less a week on average (not counting use with activity) * Cough interfering with sleep two time or less a month * Oral steroids no more than once a year * No hospitalizations * Minimize medical procedures   2. Allergic rhinitis - Continue with montelukast  10mg  daily.  - Continue with levocetirizine 5mg  daily  Follow up: 6 months   Thank you so much for letting me partake in your care today.  Don't hesitate to reach out if you have any additional concerns!  Hargis Springer, MD  [facility] [place] [place]

## 2024-02-25 NOTE — Patient Instructions (Addendum)
 Severe  persistent asthma with COPD overla and hypoxemic respiratory failure and CO2 rentention  - Continue to follow with Dr. McQuaid as he is the main manager of your asthma/COPD now   - Daily controller medication(s):       AM: Pulmicort  (budesonide  0.5mg ) + Brovana  (aformoterol) + Ohtuvayre  (ensifentrine ) + Yupelri  (revefenacin ) + prednisone  10 mg      PM: Pulmicort  (budesonide  0.5mg ) + Brovana  (aformoterol) + Ohtuvayre  (ensifentrine )       EVERY FOUR WEEKS: Tezspire   - Rescue medications: Xopenex  (levalbuterol ) + Atrovent  (ipratropium) mixed together every 4-6 hours as needed via nebulizer - Asthma control goals:  * Full participation in all desired activities (may need albuterol  before activity) * Albuterol  use two time or less a week on average (not counting use with activity) * Cough interfering with sleep two time or less a month * Oral steroids no more than once a year * No hospitalizations * Minimize medical procedures   2. Allergic rhinitis - Continue with montelukast  10mg  daily.  - Continue with levocetirizine 5mg  daily  Follow up: 6 months   Thank you so much for letting me partake in your care today.  Don't hesitate to reach out if you have any additional concerns!  Anne Springer, MD  [facility] [place] [place]

## 2024-03-18 ENCOUNTER — Telehealth: Payer: Self-pay | Admitting: Internal Medicine

## 2024-03-18 NOTE — Telephone Encounter (Signed)
 [facility] REQUESTING CLINICAL NOTES FOR YUPELRI  FAX # [phone].

## 2024-03-18 NOTE — Telephone Encounter (Signed)
 I have no idea how to fax notes to this place. Can someone else do it? Unsure why this was routed to me.   Marty Shaggy, MD [facility] 

## 2024-04-21 ENCOUNTER — Ambulatory Visit

## 2024-04-21 DIAGNOSIS — J4550 Severe persistent asthma, uncomplicated: Secondary | ICD-10-CM

## 2024-05-20 ENCOUNTER — Ambulatory Visit (INDEPENDENT_AMBULATORY_CARE_PROVIDER_SITE_OTHER)

## 2024-05-20 DIAGNOSIS — J4550 Severe persistent asthma, uncomplicated: Secondary | ICD-10-CM

## 2024-06-17 ENCOUNTER — Ambulatory Visit

## 2024-06-17 DIAGNOSIS — J4550 Severe persistent asthma, uncomplicated: Secondary | ICD-10-CM

## 2024-07-15 ENCOUNTER — Ambulatory Visit
